# Patient Record
Sex: Female | Born: 1975
Health system: Midwestern US, Community
[De-identification: ages and names within clinical notes are randomized; demographics above are authoritative.]

## PROBLEM LIST (undated history)

## (undated) DIAGNOSIS — E559 Vitamin D deficiency, unspecified: Secondary | ICD-10-CM

## (undated) DIAGNOSIS — D5700 Hb-SS disease with crisis, unspecified: Principal | ICD-10-CM

## (undated) DIAGNOSIS — D571 Sickle-cell disease without crisis: Principal | ICD-10-CM

## (undated) DIAGNOSIS — T85818A Embolism due to other internal prosthetic devices, implants and grafts, initial encounter: Secondary | ICD-10-CM

## (undated) DIAGNOSIS — I639 Cerebral infarction, unspecified: Secondary | ICD-10-CM

## (undated) DIAGNOSIS — R569 Unspecified convulsions: Secondary | ICD-10-CM

## (undated) HISTORY — PX: TUBAL LIGATION: SHX77

## (undated) HISTORY — PX: CHOLECYSTECTOMY: SHX55

## (undated) MED FILL — VITAMIN D (ERGOCALCIFE 1.25 MG CAPS: 1.25 MG (50000 UT) | 14 days supply | Qty: 14 | Fill #0 | Status: AC

## (undated) MED FILL — LEVOFLOXACIN 500MG TABS: 500 MG | 10 days supply | Qty: 10 | Fill #0 | Status: AC

## (undated) MED FILL — HYDROcodone/APAP 5-325MG TAB: 5-325 MG | 6 days supply | Qty: 42 | Fill #0 | Status: AC

---

## 2013-12-04 DIAGNOSIS — K5909 Other constipation: Secondary | ICD-10-CM | POA: Diagnosis not present

## 2013-12-04 DIAGNOSIS — F341 Dysthymic disorder: Secondary | ICD-10-CM | POA: Diagnosis not present

## 2013-12-04 DIAGNOSIS — D57819 Other sickle-cell disorders with crisis, unspecified: Secondary | ICD-10-CM | POA: Diagnosis not present

## 2013-12-04 DIAGNOSIS — M542 Cervicalgia: Secondary | ICD-10-CM | POA: Diagnosis not present

## 2013-12-04 DIAGNOSIS — Z888 Allergy status to other drugs, medicaments and biological substances status: Secondary | ICD-10-CM | POA: Diagnosis not present

## 2013-12-04 DIAGNOSIS — G8929 Other chronic pain: Secondary | ICD-10-CM | POA: Diagnosis not present

## 2013-12-04 DIAGNOSIS — G43909 Migraine, unspecified, not intractable, without status migrainosus: Secondary | ICD-10-CM | POA: Diagnosis not present

## 2013-12-04 DIAGNOSIS — K219 Gastro-esophageal reflux disease without esophagitis: Secondary | ICD-10-CM | POA: Diagnosis not present

## 2013-12-04 DIAGNOSIS — D571 Sickle-cell disease without crisis: Secondary | ICD-10-CM | POA: Diagnosis not present

## 2013-12-04 DIAGNOSIS — R110 Nausea: Secondary | ICD-10-CM | POA: Diagnosis not present

## 2013-12-04 DIAGNOSIS — I69998 Other sequelae following unspecified cerebrovascular disease: Secondary | ICD-10-CM | POA: Diagnosis not present

## 2013-12-04 DIAGNOSIS — R29898 Other symptoms and signs involving the musculoskeletal system: Secondary | ICD-10-CM | POA: Diagnosis not present

## 2013-12-04 DIAGNOSIS — R569 Unspecified convulsions: Secondary | ICD-10-CM | POA: Diagnosis not present

## 2013-12-04 DIAGNOSIS — R079 Chest pain, unspecified: Secondary | ICD-10-CM | POA: Diagnosis not present

## 2013-12-04 DIAGNOSIS — R0789 Other chest pain: Secondary | ICD-10-CM | POA: Diagnosis not present

## 2013-12-04 DIAGNOSIS — T85898A Other specified complication of other internal prosthetic devices, implants and grafts, initial encounter: Secondary | ICD-10-CM | POA: Diagnosis not present

## 2013-12-04 DIAGNOSIS — Z79899 Other long term (current) drug therapy: Secondary | ICD-10-CM | POA: Diagnosis not present

## 2013-12-04 DIAGNOSIS — G4700 Insomnia, unspecified: Secondary | ICD-10-CM | POA: Diagnosis not present

## 2013-12-04 DIAGNOSIS — D473 Essential (hemorrhagic) thrombocythemia: Secondary | ICD-10-CM | POA: Diagnosis not present

## 2013-12-09 DIAGNOSIS — M542 Cervicalgia: Secondary | ICD-10-CM | POA: Diagnosis not present

## 2013-12-09 DIAGNOSIS — R569 Unspecified convulsions: Secondary | ICD-10-CM | POA: Diagnosis not present

## 2013-12-09 DIAGNOSIS — G8929 Other chronic pain: Secondary | ICD-10-CM | POA: Diagnosis not present

## 2013-12-09 DIAGNOSIS — R110 Nausea: Secondary | ICD-10-CM | POA: Diagnosis not present

## 2013-12-09 DIAGNOSIS — D571 Sickle-cell disease without crisis: Secondary | ICD-10-CM | POA: Diagnosis not present

## 2013-12-09 DIAGNOSIS — G4700 Insomnia, unspecified: Secondary | ICD-10-CM | POA: Diagnosis not present

## 2013-12-09 DIAGNOSIS — R079 Chest pain, unspecified: Secondary | ICD-10-CM | POA: Diagnosis not present

## 2013-12-09 DIAGNOSIS — K5909 Other constipation: Secondary | ICD-10-CM | POA: Diagnosis not present

## 2013-12-12 DIAGNOSIS — A4189 Other specified sepsis: Secondary | ICD-10-CM | POA: Diagnosis not present

## 2013-12-12 DIAGNOSIS — D571 Sickle-cell disease without crisis: Secondary | ICD-10-CM | POA: Diagnosis not present

## 2013-12-12 DIAGNOSIS — I749 Embolism and thrombosis of unspecified artery: Secondary | ICD-10-CM | POA: Diagnosis not present

## 2013-12-17 DIAGNOSIS — D571 Sickle-cell disease without crisis: Secondary | ICD-10-CM | POA: Diagnosis not present

## 2014-03-08 DIAGNOSIS — N390 Urinary tract infection, site not specified: Secondary | ICD-10-CM | POA: Diagnosis not present

## 2014-03-08 DIAGNOSIS — R059 Cough, unspecified: Secondary | ICD-10-CM | POA: Diagnosis not present

## 2014-03-08 DIAGNOSIS — M542 Cervicalgia: Secondary | ICD-10-CM | POA: Diagnosis not present

## 2014-03-08 DIAGNOSIS — R079 Chest pain, unspecified: Secondary | ICD-10-CM | POA: Diagnosis not present

## 2014-03-08 DIAGNOSIS — R05 Cough: Secondary | ICD-10-CM | POA: Diagnosis not present

## 2014-03-08 DIAGNOSIS — D649 Anemia, unspecified: Secondary | ICD-10-CM | POA: Diagnosis not present

## 2014-03-08 DIAGNOSIS — G40909 Epilepsy, unspecified, not intractable, without status epilepticus: Secondary | ICD-10-CM | POA: Diagnosis not present

## 2014-03-08 DIAGNOSIS — Z79899 Other long term (current) drug therapy: Secondary | ICD-10-CM | POA: Diagnosis not present

## 2014-03-08 DIAGNOSIS — R159 Full incontinence of feces: Secondary | ICD-10-CM | POA: Diagnosis not present

## 2014-03-29 DIAGNOSIS — Z886 Allergy status to analgesic agent status: Secondary | ICD-10-CM | POA: Diagnosis not present

## 2014-03-29 DIAGNOSIS — D5700 Hb-SS disease with crisis, unspecified: Secondary | ICD-10-CM | POA: Diagnosis not present

## 2014-03-29 DIAGNOSIS — Z888 Allergy status to other drugs, medicaments and biological substances status: Secondary | ICD-10-CM | POA: Diagnosis not present

## 2014-04-02 DIAGNOSIS — M549 Dorsalgia, unspecified: Secondary | ICD-10-CM | POA: Diagnosis not present

## 2014-04-02 DIAGNOSIS — D571 Sickle-cell disease without crisis: Secondary | ICD-10-CM | POA: Diagnosis not present

## 2014-05-10 DIAGNOSIS — D5700 Hb-SS disease with crisis, unspecified: Secondary | ICD-10-CM | POA: Diagnosis not present

## 2014-05-10 DIAGNOSIS — R072 Precordial pain: Secondary | ICD-10-CM | POA: Diagnosis not present

## 2014-05-10 DIAGNOSIS — R0789 Other chest pain: Secondary | ICD-10-CM | POA: Diagnosis not present

## 2014-05-10 DIAGNOSIS — Z885 Allergy status to narcotic agent status: Secondary | ICD-10-CM | POA: Diagnosis not present

## 2014-05-10 DIAGNOSIS — Z888 Allergy status to other drugs, medicaments and biological substances status: Secondary | ICD-10-CM | POA: Diagnosis not present

## 2014-05-11 DIAGNOSIS — D5700 Hb-SS disease with crisis, unspecified: Secondary | ICD-10-CM | POA: Diagnosis not present

## 2014-05-11 DIAGNOSIS — R072 Precordial pain: Secondary | ICD-10-CM | POA: Diagnosis not present

## 2014-05-11 DIAGNOSIS — Z885 Allergy status to narcotic agent status: Secondary | ICD-10-CM | POA: Diagnosis not present

## 2014-05-11 DIAGNOSIS — Z888 Allergy status to other drugs, medicaments and biological substances status: Secondary | ICD-10-CM | POA: Diagnosis not present

## 2014-08-02 DIAGNOSIS — Z885 Allergy status to narcotic agent status: Secondary | ICD-10-CM | POA: Diagnosis not present

## 2014-08-02 DIAGNOSIS — D5700 Hb-SS disease with crisis, unspecified: Secondary | ICD-10-CM | POA: Diagnosis not present

## 2014-08-02 DIAGNOSIS — Z888 Allergy status to other drugs, medicaments and biological substances status: Secondary | ICD-10-CM | POA: Diagnosis not present

## 2014-08-02 DIAGNOSIS — Z886 Allergy status to analgesic agent status: Secondary | ICD-10-CM | POA: Diagnosis not present

## 2014-08-02 DIAGNOSIS — Z91199 Patient's noncompliance with other medical treatment and regimen due to unspecified reason: Secondary | ICD-10-CM | POA: Diagnosis not present

## 2014-08-02 DIAGNOSIS — D571 Sickle-cell disease without crisis: Secondary | ICD-10-CM | POA: Diagnosis not present

## 2014-08-02 DIAGNOSIS — Z9119 Patient's noncompliance with other medical treatment and regimen: Secondary | ICD-10-CM | POA: Diagnosis not present

## 2014-08-09 DIAGNOSIS — Z7901 Long term (current) use of anticoagulants: Secondary | ICD-10-CM | POA: Diagnosis not present

## 2014-08-09 DIAGNOSIS — D571 Sickle-cell disease without crisis: Secondary | ICD-10-CM | POA: Diagnosis not present

## 2014-08-09 DIAGNOSIS — M79609 Pain in unspecified limb: Secondary | ICD-10-CM | POA: Diagnosis not present

## 2014-08-09 DIAGNOSIS — D5700 Hb-SS disease with crisis, unspecified: Secondary | ICD-10-CM | POA: Diagnosis not present

## 2014-08-09 DIAGNOSIS — Z86711 Personal history of pulmonary embolism: Secondary | ICD-10-CM | POA: Diagnosis not present

## 2014-08-09 DIAGNOSIS — Z79899 Other long term (current) drug therapy: Secondary | ICD-10-CM | POA: Diagnosis not present

## 2014-08-09 DIAGNOSIS — Z8673 Personal history of transient ischemic attack (TIA), and cerebral infarction without residual deficits: Secondary | ICD-10-CM | POA: Diagnosis not present

## 2014-08-14 DIAGNOSIS — D571 Sickle-cell disease without crisis: Secondary | ICD-10-CM | POA: Diagnosis not present

## 2014-08-14 DIAGNOSIS — A4189 Other specified sepsis: Secondary | ICD-10-CM | POA: Diagnosis not present

## 2014-08-14 DIAGNOSIS — I749 Embolism and thrombosis of unspecified artery: Secondary | ICD-10-CM | POA: Diagnosis not present

## 2014-08-16 DIAGNOSIS — E860 Dehydration: Secondary | ICD-10-CM | POA: Diagnosis not present

## 2014-08-16 DIAGNOSIS — D571 Sickle-cell disease without crisis: Secondary | ICD-10-CM | POA: Diagnosis not present

## 2014-09-11 DIAGNOSIS — A4189 Other specified sepsis: Secondary | ICD-10-CM | POA: Diagnosis not present

## 2014-09-11 DIAGNOSIS — I8291 Chronic embolism and thrombosis of unspecified vein: Secondary | ICD-10-CM | POA: Diagnosis not present

## 2014-09-11 DIAGNOSIS — D571 Sickle-cell disease without crisis: Secondary | ICD-10-CM | POA: Diagnosis not present

## 2014-11-10 DIAGNOSIS — R51 Headache: Secondary | ICD-10-CM | POA: Diagnosis not present

## 2014-11-10 DIAGNOSIS — M25569 Pain in unspecified knee: Secondary | ICD-10-CM | POA: Diagnosis not present

## 2014-11-10 DIAGNOSIS — M79609 Pain in unspecified limb: Secondary | ICD-10-CM | POA: Diagnosis not present

## 2014-11-10 DIAGNOSIS — M25561 Pain in right knee: Secondary | ICD-10-CM | POA: Diagnosis not present

## 2014-11-20 DIAGNOSIS — A4189 Other specified sepsis: Secondary | ICD-10-CM | POA: Diagnosis not present

## 2014-11-20 DIAGNOSIS — I8291 Chronic embolism and thrombosis of unspecified vein: Secondary | ICD-10-CM | POA: Diagnosis not present

## 2014-11-20 DIAGNOSIS — D571 Sickle-cell disease without crisis: Secondary | ICD-10-CM | POA: Diagnosis not present

## 2014-12-11 DIAGNOSIS — I8291 Chronic embolism and thrombosis of unspecified vein: Secondary | ICD-10-CM | POA: Diagnosis not present

## 2014-12-11 DIAGNOSIS — D571 Sickle-cell disease without crisis: Secondary | ICD-10-CM | POA: Diagnosis not present

## 2014-12-11 DIAGNOSIS — A4189 Other specified sepsis: Secondary | ICD-10-CM | POA: Diagnosis not present

## 2014-12-14 DIAGNOSIS — I8291 Chronic embolism and thrombosis of unspecified vein: Secondary | ICD-10-CM | POA: Diagnosis not present

## 2014-12-14 DIAGNOSIS — D571 Sickle-cell disease without crisis: Secondary | ICD-10-CM | POA: Diagnosis not present

## 2014-12-14 DIAGNOSIS — A4189 Other specified sepsis: Secondary | ICD-10-CM | POA: Diagnosis not present

## 2015-01-13 DIAGNOSIS — Z887 Allergy status to serum and vaccine status: Secondary | ICD-10-CM | POA: Diagnosis not present

## 2015-01-13 DIAGNOSIS — Z886 Allergy status to analgesic agent status: Secondary | ICD-10-CM | POA: Diagnosis not present

## 2015-01-13 DIAGNOSIS — D57819 Other sickle-cell disorders with crisis, unspecified: Secondary | ICD-10-CM | POA: Diagnosis not present

## 2015-01-13 DIAGNOSIS — Z885 Allergy status to narcotic agent status: Secondary | ICD-10-CM | POA: Diagnosis not present

## 2015-01-26 DIAGNOSIS — J309 Allergic rhinitis, unspecified: Secondary | ICD-10-CM | POA: Diagnosis not present

## 2015-01-26 DIAGNOSIS — R569 Unspecified convulsions: Secondary | ICD-10-CM | POA: Diagnosis not present

## 2015-01-26 DIAGNOSIS — Z7901 Long term (current) use of anticoagulants: Secondary | ICD-10-CM | POA: Diagnosis not present

## 2015-01-26 DIAGNOSIS — R51 Headache: Secondary | ICD-10-CM | POA: Diagnosis not present

## 2015-02-05 DIAGNOSIS — D5700 Hb-SS disease with crisis, unspecified: Secondary | ICD-10-CM | POA: Diagnosis not present

## 2015-02-05 DIAGNOSIS — D709 Neutropenia, unspecified: Secondary | ICD-10-CM | POA: Diagnosis not present

## 2015-03-19 DIAGNOSIS — R748 Abnormal levels of other serum enzymes: Secondary | ICD-10-CM | POA: Diagnosis not present

## 2015-03-19 DIAGNOSIS — Z888 Allergy status to other drugs, medicaments and biological substances status: Secondary | ICD-10-CM | POA: Diagnosis not present

## 2015-03-19 DIAGNOSIS — R7989 Other specified abnormal findings of blood chemistry: Secondary | ICD-10-CM | POA: Diagnosis not present

## 2015-03-19 DIAGNOSIS — Z885 Allergy status to narcotic agent status: Secondary | ICD-10-CM | POA: Diagnosis not present

## 2015-03-19 DIAGNOSIS — D57819 Other sickle-cell disorders with crisis, unspecified: Secondary | ICD-10-CM | POA: Diagnosis not present

## 2015-04-29 DIAGNOSIS — R0789 Other chest pain: Secondary | ICD-10-CM | POA: Diagnosis not present

## 2015-04-29 DIAGNOSIS — R7989 Other specified abnormal findings of blood chemistry: Secondary | ICD-10-CM | POA: Diagnosis not present

## 2015-04-29 DIAGNOSIS — D571 Sickle-cell disease without crisis: Secondary | ICD-10-CM | POA: Diagnosis not present

## 2015-05-08 DIAGNOSIS — D5700 Hb-SS disease with crisis, unspecified: Secondary | ICD-10-CM | POA: Diagnosis not present

## 2015-05-08 DIAGNOSIS — R079 Chest pain, unspecified: Secondary | ICD-10-CM | POA: Diagnosis not present

## 2015-05-09 DIAGNOSIS — D5700 Hb-SS disease with crisis, unspecified: Secondary | ICD-10-CM | POA: Diagnosis not present

## 2015-05-28 DIAGNOSIS — R7989 Other specified abnormal findings of blood chemistry: Secondary | ICD-10-CM | POA: Diagnosis not present

## 2015-05-28 DIAGNOSIS — Z885 Allergy status to narcotic agent status: Secondary | ICD-10-CM | POA: Diagnosis not present

## 2015-05-28 DIAGNOSIS — R0789 Other chest pain: Secondary | ICD-10-CM | POA: Diagnosis not present

## 2015-05-28 DIAGNOSIS — Z91040 Latex allergy status: Secondary | ICD-10-CM | POA: Diagnosis not present

## 2015-05-28 DIAGNOSIS — Z887 Allergy status to serum and vaccine status: Secondary | ICD-10-CM | POA: Diagnosis not present

## 2015-05-28 DIAGNOSIS — I8290 Acute embolism and thrombosis of unspecified vein: Secondary | ICD-10-CM | POA: Diagnosis not present

## 2015-05-28 DIAGNOSIS — Z886 Allergy status to analgesic agent status: Secondary | ICD-10-CM | POA: Diagnosis not present

## 2015-05-28 DIAGNOSIS — Z9114 Patient's other noncompliance with medication regimen: Secondary | ICD-10-CM | POA: Diagnosis not present

## 2015-05-28 DIAGNOSIS — M79609 Pain in unspecified limb: Secondary | ICD-10-CM | POA: Diagnosis not present

## 2015-05-28 DIAGNOSIS — Z888 Allergy status to other drugs, medicaments and biological substances status: Secondary | ICD-10-CM | POA: Diagnosis not present

## 2015-05-28 DIAGNOSIS — R109 Unspecified abdominal pain: Secondary | ICD-10-CM | POA: Diagnosis not present

## 2015-06-03 DIAGNOSIS — D5700 Hb-SS disease with crisis, unspecified: Secondary | ICD-10-CM | POA: Diagnosis not present

## 2015-06-03 DIAGNOSIS — D709 Neutropenia, unspecified: Secondary | ICD-10-CM | POA: Diagnosis not present

## 2015-07-26 DIAGNOSIS — Z885 Allergy status to narcotic agent status: Secondary | ICD-10-CM | POA: Diagnosis not present

## 2015-07-26 DIAGNOSIS — R748 Abnormal levels of other serum enzymes: Secondary | ICD-10-CM | POA: Diagnosis not present

## 2015-07-26 DIAGNOSIS — R7989 Other specified abnormal findings of blood chemistry: Secondary | ICD-10-CM | POA: Diagnosis not present

## 2015-07-26 DIAGNOSIS — Z886 Allergy status to analgesic agent status: Secondary | ICD-10-CM | POA: Diagnosis not present

## 2015-07-26 DIAGNOSIS — Z888 Allergy status to other drugs, medicaments and biological substances status: Secondary | ICD-10-CM | POA: Diagnosis not present

## 2015-07-26 DIAGNOSIS — I2699 Other pulmonary embolism without acute cor pulmonale: Secondary | ICD-10-CM | POA: Diagnosis not present

## 2015-07-26 DIAGNOSIS — I639 Cerebral infarction, unspecified: Secondary | ICD-10-CM | POA: Diagnosis not present

## 2015-07-26 DIAGNOSIS — R079 Chest pain, unspecified: Secondary | ICD-10-CM | POA: Diagnosis not present

## 2015-07-26 DIAGNOSIS — R109 Unspecified abdominal pain: Secondary | ICD-10-CM | POA: Diagnosis not present

## 2015-07-26 DIAGNOSIS — R569 Unspecified convulsions: Secondary | ICD-10-CM | POA: Diagnosis not present

## 2015-07-26 DIAGNOSIS — I82211 Chronic embolism and thrombosis of superior vena cava: Secondary | ICD-10-CM | POA: Diagnosis not present

## 2015-08-18 DIAGNOSIS — D5700 Hb-SS disease with crisis, unspecified: Secondary | ICD-10-CM | POA: Diagnosis not present

## 2015-08-18 DIAGNOSIS — S2341XA Sprain of ribs, initial encounter: Secondary | ICD-10-CM | POA: Diagnosis not present

## 2015-08-18 DIAGNOSIS — Z86718 Personal history of other venous thrombosis and embolism: Secondary | ICD-10-CM | POA: Diagnosis not present

## 2015-08-18 DIAGNOSIS — R209 Unspecified disturbances of skin sensation: Secondary | ICD-10-CM | POA: Diagnosis not present

## 2015-08-18 DIAGNOSIS — G40909 Epilepsy, unspecified, not intractable, without status epilepticus: Secondary | ICD-10-CM | POA: Diagnosis not present

## 2015-08-18 DIAGNOSIS — R0789 Other chest pain: Secondary | ICD-10-CM | POA: Diagnosis not present

## 2015-08-18 DIAGNOSIS — I871 Compression of vein: Secondary | ICD-10-CM | POA: Diagnosis not present

## 2015-08-18 DIAGNOSIS — K219 Gastro-esophageal reflux disease without esophagitis: Secondary | ICD-10-CM | POA: Diagnosis not present

## 2015-08-18 DIAGNOSIS — Z8673 Personal history of transient ischemic attack (TIA), and cerebral infarction without residual deficits: Secondary | ICD-10-CM | POA: Diagnosis not present

## 2015-08-18 DIAGNOSIS — G43909 Migraine, unspecified, not intractable, without status migrainosus: Secondary | ICD-10-CM | POA: Diagnosis not present

## 2015-08-18 DIAGNOSIS — I8291 Chronic embolism and thrombosis of unspecified vein: Secondary | ICD-10-CM | POA: Diagnosis not present

## 2015-08-19 DIAGNOSIS — G8194 Hemiplegia, unspecified affecting left nondominant side: Secondary | ICD-10-CM | POA: Diagnosis not present

## 2015-08-19 DIAGNOSIS — I8291 Chronic embolism and thrombosis of unspecified vein: Secondary | ICD-10-CM | POA: Diagnosis not present

## 2015-08-19 DIAGNOSIS — Z8673 Personal history of transient ischemic attack (TIA), and cerebral infarction without residual deficits: Secondary | ICD-10-CM | POA: Diagnosis not present

## 2015-08-19 DIAGNOSIS — G40909 Epilepsy, unspecified, not intractable, without status epilepticus: Secondary | ICD-10-CM | POA: Diagnosis not present

## 2015-08-19 DIAGNOSIS — K219 Gastro-esophageal reflux disease without esophagitis: Secondary | ICD-10-CM | POA: Diagnosis not present

## 2015-08-19 DIAGNOSIS — G43909 Migraine, unspecified, not intractable, without status migrainosus: Secondary | ICD-10-CM | POA: Diagnosis not present

## 2015-08-19 DIAGNOSIS — D5700 Hb-SS disease with crisis, unspecified: Secondary | ICD-10-CM | POA: Diagnosis not present

## 2015-08-19 DIAGNOSIS — Z86718 Personal history of other venous thrombosis and embolism: Secondary | ICD-10-CM | POA: Diagnosis not present

## 2015-08-19 DIAGNOSIS — I871 Compression of vein: Secondary | ICD-10-CM | POA: Diagnosis not present

## 2015-08-20 DIAGNOSIS — M25552 Pain in left hip: Secondary | ICD-10-CM | POA: Diagnosis not present

## 2015-08-20 DIAGNOSIS — I871 Compression of vein: Secondary | ICD-10-CM | POA: Diagnosis present

## 2015-08-20 DIAGNOSIS — G8194 Hemiplegia, unspecified affecting left nondominant side: Secondary | ICD-10-CM | POA: Diagnosis not present

## 2015-08-20 DIAGNOSIS — Z86718 Personal history of other venous thrombosis and embolism: Secondary | ICD-10-CM | POA: Diagnosis not present

## 2015-08-20 DIAGNOSIS — K219 Gastro-esophageal reflux disease without esophagitis: Secondary | ICD-10-CM | POA: Diagnosis present

## 2015-08-20 DIAGNOSIS — D5700 Hb-SS disease with crisis, unspecified: Secondary | ICD-10-CM | POA: Diagnosis not present

## 2015-08-20 DIAGNOSIS — G40909 Epilepsy, unspecified, not intractable, without status epilepticus: Secondary | ICD-10-CM | POA: Diagnosis present

## 2015-08-20 DIAGNOSIS — I8291 Chronic embolism and thrombosis of unspecified vein: Secondary | ICD-10-CM | POA: Diagnosis not present

## 2015-08-20 DIAGNOSIS — D559 Anemia due to enzyme disorder, unspecified: Secondary | ICD-10-CM | POA: Diagnosis not present

## 2015-08-20 DIAGNOSIS — G43909 Migraine, unspecified, not intractable, without status migrainosus: Secondary | ICD-10-CM | POA: Diagnosis present

## 2015-08-20 DIAGNOSIS — Z833 Family history of diabetes mellitus: Secondary | ICD-10-CM | POA: Diagnosis not present

## 2015-08-20 DIAGNOSIS — Z8249 Family history of ischemic heart disease and other diseases of the circulatory system: Secondary | ICD-10-CM | POA: Diagnosis not present

## 2015-08-20 DIAGNOSIS — Z8673 Personal history of transient ischemic attack (TIA), and cerebral infarction without residual deficits: Secondary | ICD-10-CM | POA: Diagnosis not present

## 2015-11-03 DIAGNOSIS — Z01810 Encounter for preprocedural cardiovascular examination: Secondary | ICD-10-CM | POA: Diagnosis not present

## 2015-11-03 DIAGNOSIS — R079 Chest pain, unspecified: Secondary | ICD-10-CM | POA: Diagnosis not present

## 2015-11-03 DIAGNOSIS — F329 Major depressive disorder, single episode, unspecified: Secondary | ICD-10-CM | POA: Diagnosis not present

## 2015-11-03 DIAGNOSIS — Z7901 Long term (current) use of anticoagulants: Secondary | ICD-10-CM | POA: Diagnosis not present

## 2015-11-03 DIAGNOSIS — Z515 Encounter for palliative care: Secondary | ICD-10-CM | POA: Diagnosis not present

## 2015-11-03 DIAGNOSIS — G894 Chronic pain syndrome: Secondary | ICD-10-CM | POA: Diagnosis present

## 2015-11-03 DIAGNOSIS — R071 Chest pain on breathing: Secondary | ICD-10-CM | POA: Diagnosis not present

## 2015-11-03 DIAGNOSIS — D57812 Other sickle-cell disorders with splenic sequestration: Secondary | ICD-10-CM | POA: Diagnosis not present

## 2015-11-03 DIAGNOSIS — D5700 Hb-SS disease with crisis, unspecified: Secondary | ICD-10-CM | POA: Diagnosis not present

## 2015-11-03 DIAGNOSIS — D5701 Hb-SS disease with acute chest syndrome: Secondary | ICD-10-CM | POA: Diagnosis not present

## 2015-11-03 DIAGNOSIS — I871 Compression of vein: Secondary | ICD-10-CM | POA: Diagnosis not present

## 2015-11-03 DIAGNOSIS — G43109 Migraine with aura, not intractable, without status migrainosus: Secondary | ICD-10-CM | POA: Diagnosis not present

## 2015-11-03 DIAGNOSIS — G8929 Other chronic pain: Secondary | ICD-10-CM | POA: Diagnosis not present

## 2015-11-03 DIAGNOSIS — G40909 Epilepsy, unspecified, not intractable, without status epilepticus: Secondary | ICD-10-CM | POA: Diagnosis not present

## 2015-11-03 DIAGNOSIS — R0789 Other chest pain: Secondary | ICD-10-CM | POA: Diagnosis not present

## 2015-11-03 DIAGNOSIS — Z8673 Personal history of transient ischemic attack (TIA), and cerebral infarction without residual deficits: Secondary | ICD-10-CM | POA: Diagnosis not present

## 2015-11-03 DIAGNOSIS — M549 Dorsalgia, unspecified: Secondary | ICD-10-CM | POA: Diagnosis not present

## 2015-11-03 DIAGNOSIS — D5720 Sickle-cell/Hb-C disease without crisis: Secondary | ICD-10-CM | POA: Diagnosis not present

## 2015-11-03 DIAGNOSIS — Z86718 Personal history of other venous thrombosis and embolism: Secondary | ICD-10-CM | POA: Diagnosis not present

## 2015-11-03 DIAGNOSIS — R0602 Shortness of breath: Secondary | ICD-10-CM | POA: Diagnosis not present

## 2015-11-03 DIAGNOSIS — Z5181 Encounter for therapeutic drug level monitoring: Secondary | ICD-10-CM | POA: Diagnosis not present

## 2015-11-03 DIAGNOSIS — I82211 Chronic embolism and thrombosis of superior vena cava: Secondary | ICD-10-CM | POA: Diagnosis not present

## 2015-11-03 DIAGNOSIS — D571 Sickle-cell disease without crisis: Secondary | ICD-10-CM | POA: Diagnosis not present

## 2015-11-03 DIAGNOSIS — G43909 Migraine, unspecified, not intractable, without status migrainosus: Secondary | ICD-10-CM | POA: Diagnosis present

## 2015-11-09 DIAGNOSIS — R0602 Shortness of breath: Secondary | ICD-10-CM | POA: Diagnosis not present

## 2015-11-09 DIAGNOSIS — R079 Chest pain, unspecified: Secondary | ICD-10-CM | POA: Diagnosis not present

## 2015-12-01 DIAGNOSIS — Z8673 Personal history of transient ischemic attack (TIA), and cerebral infarction without residual deficits: Secondary | ICD-10-CM | POA: Diagnosis not present

## 2015-12-01 DIAGNOSIS — Z833 Family history of diabetes mellitus: Secondary | ICD-10-CM | POA: Diagnosis not present

## 2015-12-01 DIAGNOSIS — D5700 Hb-SS disease with crisis, unspecified: Secondary | ICD-10-CM | POA: Diagnosis not present

## 2015-12-01 DIAGNOSIS — K219 Gastro-esophageal reflux disease without esophagitis: Secondary | ICD-10-CM | POA: Diagnosis not present

## 2015-12-01 DIAGNOSIS — Z86718 Personal history of other venous thrombosis and embolism: Secondary | ICD-10-CM | POA: Diagnosis not present

## 2015-12-01 DIAGNOSIS — I871 Compression of vein: Secondary | ICD-10-CM | POA: Diagnosis not present

## 2015-12-01 DIAGNOSIS — G40909 Epilepsy, unspecified, not intractable, without status epilepticus: Secondary | ICD-10-CM | POA: Diagnosis not present

## 2015-12-01 DIAGNOSIS — Z8249 Family history of ischemic heart disease and other diseases of the circulatory system: Secondary | ICD-10-CM | POA: Diagnosis not present

## 2015-12-03 DIAGNOSIS — K219 Gastro-esophageal reflux disease without esophagitis: Secondary | ICD-10-CM | POA: Diagnosis not present

## 2015-12-03 DIAGNOSIS — I871 Compression of vein: Secondary | ICD-10-CM | POA: Diagnosis not present

## 2015-12-03 DIAGNOSIS — D5700 Hb-SS disease with crisis, unspecified: Secondary | ICD-10-CM | POA: Diagnosis not present

## 2015-12-03 DIAGNOSIS — Z8673 Personal history of transient ischemic attack (TIA), and cerebral infarction without residual deficits: Secondary | ICD-10-CM | POA: Diagnosis not present

## 2015-12-03 DIAGNOSIS — G40909 Epilepsy, unspecified, not intractable, without status epilepticus: Secondary | ICD-10-CM | POA: Diagnosis not present

## 2015-12-03 DIAGNOSIS — Z833 Family history of diabetes mellitus: Secondary | ICD-10-CM | POA: Diagnosis not present

## 2015-12-03 DIAGNOSIS — Z8249 Family history of ischemic heart disease and other diseases of the circulatory system: Secondary | ICD-10-CM | POA: Diagnosis not present

## 2015-12-03 DIAGNOSIS — G894 Chronic pain syndrome: Secondary | ICD-10-CM | POA: Diagnosis not present

## 2015-12-03 DIAGNOSIS — Z86718 Personal history of other venous thrombosis and embolism: Secondary | ICD-10-CM | POA: Diagnosis not present

## 2015-12-03 DIAGNOSIS — R0789 Other chest pain: Secondary | ICD-10-CM | POA: Diagnosis not present

## 2015-12-04 DIAGNOSIS — D649 Anemia, unspecified: Secondary | ICD-10-CM | POA: Diagnosis not present

## 2015-12-04 DIAGNOSIS — G894 Chronic pain syndrome: Secondary | ICD-10-CM | POA: Diagnosis not present

## 2015-12-04 DIAGNOSIS — Z833 Family history of diabetes mellitus: Secondary | ICD-10-CM | POA: Diagnosis not present

## 2015-12-04 DIAGNOSIS — D5700 Hb-SS disease with crisis, unspecified: Secondary | ICD-10-CM | POA: Diagnosis not present

## 2015-12-04 DIAGNOSIS — Z8673 Personal history of transient ischemic attack (TIA), and cerebral infarction without residual deficits: Secondary | ICD-10-CM | POA: Diagnosis not present

## 2015-12-04 DIAGNOSIS — Z7901 Long term (current) use of anticoagulants: Secondary | ICD-10-CM | POA: Diagnosis not present

## 2015-12-04 DIAGNOSIS — D571 Sickle-cell disease without crisis: Secondary | ICD-10-CM | POA: Diagnosis not present

## 2015-12-04 DIAGNOSIS — Z8249 Family history of ischemic heart disease and other diseases of the circulatory system: Secondary | ICD-10-CM | POA: Diagnosis not present

## 2015-12-04 DIAGNOSIS — K219 Gastro-esophageal reflux disease without esophagitis: Secondary | ICD-10-CM | POA: Diagnosis not present

## 2015-12-04 DIAGNOSIS — I771 Stricture of artery: Secondary | ICD-10-CM | POA: Diagnosis present

## 2015-12-04 DIAGNOSIS — I871 Compression of vein: Secondary | ICD-10-CM | POA: Diagnosis not present

## 2015-12-04 DIAGNOSIS — Z86718 Personal history of other venous thrombosis and embolism: Secondary | ICD-10-CM | POA: Diagnosis not present

## 2015-12-04 DIAGNOSIS — G40909 Epilepsy, unspecified, not intractable, without status epilepticus: Secondary | ICD-10-CM | POA: Diagnosis not present

## 2016-01-02 DIAGNOSIS — Y939 Activity, unspecified: Secondary | ICD-10-CM | POA: Diagnosis not present

## 2016-01-02 DIAGNOSIS — S20212A Contusion of left front wall of thorax, initial encounter: Secondary | ICD-10-CM | POA: Diagnosis not present

## 2016-01-02 DIAGNOSIS — D571 Sickle-cell disease without crisis: Secondary | ICD-10-CM | POA: Diagnosis not present

## 2016-01-02 DIAGNOSIS — R079 Chest pain, unspecified: Secondary | ICD-10-CM | POA: Diagnosis not present

## 2016-01-02 DIAGNOSIS — Z8673 Personal history of transient ischemic attack (TIA), and cerebral infarction without residual deficits: Secondary | ICD-10-CM | POA: Diagnosis not present

## 2016-01-02 DIAGNOSIS — Y92003 Bedroom of unspecified non-institutional (private) residence as the place of occurrence of the external cause: Secondary | ICD-10-CM | POA: Diagnosis not present

## 2016-01-31 ENCOUNTER — Encounter (HOSPITAL_COMMUNITY): Payer: Self-pay | Admitting: *Deleted

## 2016-01-31 ENCOUNTER — Emergency Department (HOSPITAL_COMMUNITY)
Admission: EM | Admit: 2016-01-31 | Discharge: 2016-01-31 | Disposition: A | Discharge status: Home / Self Care | Payer: Self-pay | Attending: Emergency Medicine | Admitting: Emergency Medicine

## 2016-01-31 DIAGNOSIS — R079 Chest pain, unspecified: Secondary | ICD-10-CM | POA: Insufficient documentation

## 2016-01-31 DIAGNOSIS — D5700 Hb-SS disease with crisis, unspecified: Secondary | ICD-10-CM | POA: Insufficient documentation

## 2016-01-31 HISTORY — DX: Embolism due to other internal prosthetic devices, implants and grafts, initial encounter: T85.818A

## 2016-01-31 HISTORY — DX: Sickle-cell disease without crisis: D57.1

## 2016-01-31 HISTORY — DX: Unspecified convulsions: R56.9

## 2016-01-31 HISTORY — DX: Cerebral infarction, unspecified: I63.9

## 2016-01-31 LAB — COMPREHENSIVE METABOLIC PANEL
ALBUMIN: 4.2 g/dL (ref 3.5–5.0)
ALK PHOS: 112 U/L (ref 38–126)
ALT: 19 U/L (ref 14–54)
AST: 27 U/L (ref 15–41)
Anion gap: 11 (ref 5–15)
BILIRUBIN TOTAL: 0.9 mg/dL (ref 0.3–1.2)
CALCIUM: 9.1 mg/dL (ref 8.9–10.3)
CO2: 20 mmol/L — ABNORMAL LOW (ref 22–32)
Chloride: 109 mmol/L (ref 101–111)
Creatinine, Ser: 0.81 mg/dL (ref 0.44–1.00)
GFR calc Af Amer: >60 mL/min (ref >60)
GLUCOSE: 94 mg/dL (ref 65–99)
POTASSIUM: 4.3 mmol/L (ref 3.5–5.1)
Sodium: 140 mmol/L (ref 135–145)
TOTAL PROTEIN: 8.2 g/dL — AB (ref 6.5–8.1)

## 2016-01-31 LAB — CBC WITH DIFFERENTIAL/PLATELET
BASOS ABS: 0 10*3/uL (ref 0.0–0.1)
BASOS PCT: 0 %
EOS ABS: 0 10*3/uL (ref 0.0–0.7)
EOS PCT: 1 %
HCT: 31.7 % — ABNORMAL LOW (ref 36.0–46.0)
Hemoglobin: 11.3 g/dL — ABNORMAL LOW (ref 12.0–15.0)
Lymphocytes Relative: 40 %
Lymphs Abs: 1.9 10*3/uL (ref 0.7–4.0)
MCH: 38.7 pg — ABNORMAL HIGH (ref 26.0–34.0)
MCHC: 35.6 g/dL (ref 30.0–36.0)
MCV: 108.6 fL — ABNORMAL HIGH (ref 78.0–100.0)
MONO ABS: 0.7 10*3/uL (ref 0.1–1.0)
Monocytes Relative: 14 %
Neutro Abs: 2.2 10*3/uL (ref 1.7–7.7)
Neutrophils Relative %: 45 %
PLATELETS: 360 10*3/uL (ref 150–400)
RBC: 2.92 MIL/uL — AB (ref 3.87–5.11)
RDW: 18.7 % — AB (ref 11.5–15.5)
WBC: 4.9 10*3/uL (ref 4.0–10.5)

## 2016-01-31 NOTE — ED Notes (Signed)
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 Reports onset to left side chest pain last night. Reports swelling to chest and now having swelling and pain to her left arm.

## 2016-02-02 ENCOUNTER — Emergency Department (HOSPITAL_COMMUNITY): Payer: Medicare Other

## 2016-02-02 ENCOUNTER — Emergency Department (HOSPITAL_COMMUNITY)
Admission: EM | Admit: 2016-02-02 | Discharge: 2016-02-02 | Disposition: A | Discharge status: Home / Self Care | Payer: Medicare Other | Attending: Emergency Medicine | Admitting: Emergency Medicine

## 2016-02-02 ENCOUNTER — Emergency Department (EMERGENCY_DEPARTMENT_HOSPITAL): Payer: Medicare Other

## 2016-02-02 ENCOUNTER — Encounter (HOSPITAL_COMMUNITY): Payer: Self-pay | Admitting: Emergency Medicine

## 2016-02-02 DIAGNOSIS — Z7901 Long term (current) use of anticoagulants: Secondary | ICD-10-CM | POA: Diagnosis not present

## 2016-02-02 DIAGNOSIS — M7989 Other specified soft tissue disorders: Secondary | ICD-10-CM

## 2016-02-02 DIAGNOSIS — M25422 Effusion, left elbow: Secondary | ICD-10-CM | POA: Insufficient documentation

## 2016-02-02 DIAGNOSIS — Z3202 Encounter for pregnancy test, result negative: Secondary | ICD-10-CM | POA: Insufficient documentation

## 2016-02-02 DIAGNOSIS — R05 Cough: Secondary | ICD-10-CM | POA: Diagnosis not present

## 2016-02-02 DIAGNOSIS — D5700 Hb-SS disease with crisis, unspecified: Secondary | ICD-10-CM

## 2016-02-02 DIAGNOSIS — Z79899 Other long term (current) drug therapy: Secondary | ICD-10-CM | POA: Insufficient documentation

## 2016-02-02 DIAGNOSIS — M79609 Pain in unspecified limb: Secondary | ICD-10-CM

## 2016-02-02 DIAGNOSIS — R0981 Nasal congestion: Secondary | ICD-10-CM | POA: Diagnosis not present

## 2016-02-02 DIAGNOSIS — G8929 Other chronic pain: Secondary | ICD-10-CM | POA: Insufficient documentation

## 2016-02-02 DIAGNOSIS — Z8673 Personal history of transient ischemic attack (TIA), and cerebral infarction without residual deficits: Secondary | ICD-10-CM | POA: Diagnosis not present

## 2016-02-02 DIAGNOSIS — Z91040 Latex allergy status: Secondary | ICD-10-CM | POA: Insufficient documentation

## 2016-02-02 LAB — CBC WITH DIFFERENTIAL/PLATELET
BASOS PCT: 1 %
Basophils Absolute: 0.1 10*3/uL (ref 0.0–0.1)
EOS ABS: 0 10*3/uL (ref 0.0–0.7)
EOS PCT: 1 %
HCT: 29.6 % — ABNORMAL LOW (ref 36.0–46.0)
Hemoglobin: 10.3 g/dL — ABNORMAL LOW (ref 12.0–15.0)
LYMPHS ABS: 2.5 10*3/uL (ref 0.7–4.0)
Lymphocytes Relative: 51 %
MCH: 38 pg — AB (ref 26.0–34.0)
MCHC: 34.8 g/dL (ref 30.0–36.0)
MCV: 109.2 fL — ABNORMAL HIGH (ref 78.0–100.0)
Monocytes Absolute: 0.7 10*3/uL (ref 0.1–1.0)
Monocytes Relative: 14 %
Neutro Abs: 1.6 10*3/uL — ABNORMAL LOW (ref 1.7–7.7)
Neutrophils Relative %: 33 %
PLATELETS: 376 10*3/uL (ref 150–400)
RBC: 2.71 MIL/uL — AB (ref 3.87–5.11)
RDW: 18.4 % — ABNORMAL HIGH (ref 11.5–15.5)
WBC: 4.9 10*3/uL (ref 4.0–10.5)

## 2016-02-02 LAB — RETICULOCYTES
RBC.: 2.71 MIL/uL — AB (ref 3.87–5.11)
Retic Count, Absolute: 75.9 10*3/uL (ref 19.0–186.0)
Retic Ct Pct: 2.8 % (ref 0.4–3.1)

## 2016-02-02 LAB — I-STAT TROPONIN, ED: Troponin i, poc: 0 ng/mL (ref 0.00–0.08)

## 2016-02-02 LAB — BASIC METABOLIC PANEL WITH GFR
Anion gap: 8 (ref 5–15)
BUN: 8 mg/dL (ref 6–20)
CO2: 23 mmol/L (ref 22–32)
Calcium: 8.8 mg/dL — ABNORMAL LOW (ref 8.9–10.3)
Chloride: 108 mmol/L (ref 101–111)
Creatinine, Ser: 0.78 mg/dL (ref 0.44–1.00)
GFR calc Af Amer: >60 mL/min
GFR calc non Af Amer: >60 mL/min
Glucose, Bld: 93 mg/dL (ref 65–99)
Potassium: 3.7 mmol/L (ref 3.5–5.1)
Sodium: 139 mmol/L (ref 135–145)

## 2016-02-02 LAB — APTT: aPTT: 31 s (ref 24–37)

## 2016-02-02 LAB — PROTIME-INR
INR: 1.07 (ref 0.00–1.49)
PROTHROMBIN TIME: 14.1 s (ref 11.6–15.2)

## 2016-02-02 LAB — I-STAT BETA HCG BLOOD, ED (MC, WL, AP ONLY): I-stat hCG, quantitative: <5 m[IU]/mL (ref <5)

## 2016-02-02 MED ORDER — DIPHENHYDRAMINE HCL 50 MG/ML IJ SOLN
25.0000 mg | Freq: Once | INTRAMUSCULAR | Status: AC
  Administered 2016-02-02: 25 mg via INTRAVENOUS
  Filled 2016-02-02: qty 1

## 2016-02-02 MED ORDER — IOHEXOL 350 MG/ML SOLN
100.0000 mL | Freq: Once | INTRAVENOUS | Status: AC | PRN
  Administered 2016-02-02: 100 mL via INTRAVENOUS

## 2016-02-02 MED ORDER — HYDROMORPHONE HCL 2 MG/ML IJ SOLN
2.0000 mg | Freq: Once | INTRAMUSCULAR | Status: AC
  Administered 2016-02-02: 2 mg via INTRAVENOUS
  Filled 2016-02-02: qty 1

## 2016-02-02 MED ORDER — SODIUM CHLORIDE 0.45 % IV SOLN
INTRAVENOUS | Status: DC
  Administered 2016-02-02: 17:00:00 via INTRAVENOUS

## 2016-02-02 MED ORDER — ONDANSETRON HCL 4 MG/2ML IJ SOLN
4.0000 mg | Freq: Once | INTRAMUSCULAR | Status: AC
  Administered 2016-02-02: 4 mg via INTRAVENOUS
  Filled 2016-02-02: qty 2

## 2016-02-02 MED ORDER — HYDROMORPHONE HCL 1 MG/ML IJ SOLN
1.0000 mg | Freq: Once | INTRAMUSCULAR | Status: AC
  Administered 2016-02-02: 1 mg via INTRAVENOUS
  Filled 2016-02-02: qty 1

## 2016-02-02 NOTE — ED Notes (Signed)
Patient transported to CT 

## 2016-02-02 NOTE — ED Provider Notes (Signed)
CSN: [id number]     Arrival date & time 02/02/16  1347 History   First MD Initiated Contact with Patient 02/02/16 1610     Chief Complaint  Patient presents with  . Sickle Cell Pain Crisis     (Consider location/radiation/quality/duration/timing/severity/associated sxs/prior Treatment) HPI   Pt is a 40 yo female with PMH of sickle cell, seizures, stroke and blood clots who presents to the ED with complaint of left chest and arm pain and swelling, onset 2 weeks. Pt reports she has had worsening pain and swelling to her left chest and left upper arm for the past few weeks. Denies any recent fall, trauma or injury. Denies any aggravating or alleviating factors. She notes she has had similar pain in the past when she was dx with a blood clot in her right breast. 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 She also reports having nasal congestion, cough and sore throat. Denies fever, chills, HA, visual changes, lightheadedness, dizziness, SOB, wheezing, palpitations, abdominal pain, N/V/D, urinary sxs, weakness. Pt reports having constant numbness to her left arm which is a deficit from her stroke in 2002 and reports it as unchanged. She notes she currently does not have any more pain meds left and she has only been taking her Xarelto, keppra, hydroxyurea and folic acid. She notes her PCP is in [place] but notes she recently moved to [place].   Past Medical History  Diagnosis Date  . Sickle cell disease ([place])   . Seizures ([place])   . Stroke ([place])   . Blood clot due to device, implant, or graft    History reviewed. No pertinent past surgical history. No family history on file. Social History  Substance Use Topics  . Smoking status: Never Smoker   . Smokeless tobacco: None  . Alcohol Use: No   OB History    No data available     Review of Systems  HENT:  Positive for congestion.   Respiratory: Positive for cough.   Cardiovascular: Positive for chest pain.  Musculoskeletal: Positive for back pain, joint swelling (left arm) and arthralgias (left arm, bilateral legs).  All other systems reviewed and are negative.     Allergies  Demerol; Latex; Other; and Oxycontin  Home Medications   Prior to Admission medications   Medication Sig Start Date End Date Taking? Authorizing Provider  cetirizine (ZYRTEC) 10 MG tablet Take 10 mg by mouth daily.   Yes Historical Provider, MD  folic acid (FOLVITE) 1 MG tablet Take 1 mg by mouth daily.   Yes Historical Provider, MD  hydroxyurea (HYDREA) 500 MG capsule Take 1,000 mg by mouth daily. May take with food to minimize GI side effects.   Yes Historical Provider, MD  levETIRAcetam (KEPPRA) 500 MG tablet Take 1,000 mg by mouth every 12 (twelve) hours.   Yes Historical Provider, MD  methadone (DOLOPHINE) 10 MG tablet Take 10 mg by mouth every 12 (twelve) hours.   Yes Historical Provider, MD  rivaroxaban (XARELTO) 20 MG TABS tablet Take 20 mg by mouth daily.   Yes Historical Provider, MD   BP 121/67 mmHg  Pulse 85  Temp(Src) 98.5 F (36.9 C) (Oral)  Resp 14  Ht 5\' 8"  (1.727 m)  Wt 66.225 kg  BMI 22.20 kg/m2  SpO2 96%  LMP 01/06/2016 Physical Exam  Constitutional: She is oriented to person, place, and time.  She appears well-developed and well-nourished.  HENT:  Head: Normocephalic and atraumatic.  Mouth/Throat: Oropharynx is clear and moist. No oropharyngeal exudate.  Eyes: Conjunctivae and EOM are normal. Right eye exhibits no discharge. Left eye exhibits no discharge. No scleral icterus.  Neck: Normal range of motion. Neck supple.  Cardiovascular: Normal rate, regular rhythm, normal heart sounds and intact distal pulses.   Pulmonary/Chest: Effort normal and breath sounds normal. No respiratory distress. She has no wheezes. She has no rales. She exhibits tenderness (left anterior and lateral chest  wall mildly TTP).  Abdominal: Soft. Bowel sounds are normal. She exhibits no distension and no mass. There is no tenderness. There is no rebound and no guarding.  Musculoskeletal: Normal range of motion. She exhibits no edema.  TTP over bilateral cervical, thoracic, and lumbar paraspinal muscles. No C/T/L midline tenderness. FROM of neck and back with reported pain. Mild swelling noted to left proximal arm near axillary region. FROM of BUE and BLE with 5/5 strength. Sensation grossly intact but pt reports numbness to left arm which she notes is unchanged fromm her CVA in 2002. 2+ radial and PT pulses. Cap refill <2.   Lymphadenopathy:    She has no cervical adenopathy.  Neurological: She is alert and oriented to person, place, and time.  Skin: Skin is warm and dry.  Nursing note and vitals reviewed.   ED Course  Procedures (including critical care time) Labs Review Labs Reviewed  CBC WITH DIFFERENTIAL/PLATELET - Abnormal; Notable for the following:    RBC 2.71 (*)    Hemoglobin 10.3 (*)    HCT 29.6 (*)    MCV 109.2 (*)    MCH 38.0 (*)    RDW 18.4 (*)    Neutro Abs 1.6 (*)    All other components within normal limits  RETICULOCYTES - Abnormal; Notable for the following:    RBC. 2.71 (*)    All other components within normal limits  BASIC METABOLIC PANEL - Abnormal; Notable for the following:    Calcium 8.8 (*)    All other components within normal limits  PROTIME-INR  APTT  I-STAT TROPOININ, ED  I-STAT BETA HCG BLOOD, ED (MC, WL, AP ONLY)    Imaging Review Ct Angio Chest Pe W/cm &/or Wo Cm  02/02/2016  CLINICAL DATA:  Sickle cell crisis. Arm pain and back pain. Bilateral leg pain. EXAM: CT ANGIOGRAPHY CHEST WITH CONTRAST TECHNIQUE: Multidetector CT imaging of the chest was performed using the standard protocol during bolus administration of intravenous contrast. Multiplanar CT image reconstructions and MIPs were obtained to evaluate the vascular anatomy. CONTRAST:  12mL OMNIPAQUE  IOHEXOL 350 MG/ML SOLN COMPARISON:  None. FINDINGS: Mediastinum/Nodes: No filling defects within the pulmonary arteries to clearly localize pulmonary emboli. There is a focus of low density within the LEFT proximal lower lobe pulmonary artery (image 101, series 7). This is not in the typical shape of pulmonary embolism and felt to represent attenuation artifact. The more distal vessel of fills normally. No additional evidence of pulmonary emboli. 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 RIGHT antecubital fossa injection. No acute findings aorta great vessels.  No pericardial fluid. No mediastinal hilar lymphadenopathy.  Esophagus is normal. Lungs/Pleura: There is mild linear reticular pattern in the LEFT and RIGHT lung base. There is atelectasis in the inferior lingula. There is no airspace disease. No ground-glass opacities. No  infarction. Upper abdomen: Limited view of the liver, kidneys, pancreas are unremarkable. Normal adrenal glands. The spleen is small and calcified Musculoskeletal: No diffuse sclerosis of the bones per Review of the MIP images confirms the above findings. IMPRESSION: 1. No evidence acute pulmonary embolism. 2. Low-attenuation focus within proximal LEFT main pulmonary artery is felt to represent an attenuation artifact. 3. 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 4. No evidence of pulmonary infection or infarction. 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 5. Autoinfarction the spleen. 6. diffuse skeletal sclerosis consistent sickle cell. Electronically Signed   By: Suzy Bouchard M.D.   On: 02/02/2016 18:34   I have personally reviewed and evaluated these images and lab results as part of my medical decision-making.   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 She notes she recently moved to [place] over the past 2 weeks and has been out of her home Dilaudid. Hx of CVA (on xarelto), seizures and multiple blood clots. VSS. Exam revealed mild tenderness and swelling over left lateral chest wall and left proximal arm near axillary region, bilateral C/T/L paraspinal muscles TTP. No back pain red flags. Bilateral upper and lower extremities otherwise neurovascularly intact. Patient given IV fluids and IV pain meds, benadryl and zofran in the ED.   EKG showed sinus rhythm. Troponin negative. Pregnancy negative. Labs appear to be at patient's baseline values. CT NGO chest revealed no acute PE, occlusion of right subclavian vein/superior vena cava with multiple venous collaterals, no evidence of pulmonary infection or infarction, chronic reticular scarring of the lung bases noted, diffuse skeletal sclerosis consistent with sickle cell. 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 Patient given a total of 3 doses of IV Dilaudid. On reevaluation after her third dose, patient reports her pain has improved and she is requesting to go home at that time. Discussed with patient her results and plan for discharge. Patient given information regarding establishing care and further management at the sickle cell clinic. Patient endorses understanding and reports she will call the clinic in the morning to schedule an appointment. Patient able to tolerate by mouth in the ED.  Evaluation does not show pathology requring ongoing emergent intervention or admission. Pt is hemodynamically stable and mentating appropriately. Discussed findings/results and plan with  patient/guardian, who agrees with plan. All questions answered. Return precautions discussed and outpatient follow up given.      Chesley Noon [place], [place] 02/02/16 2011  Tanna Furry, MD 02/14/16 [phone]

## 2016-02-02 NOTE — Discharge Instructions (Signed)
Take your home medications at prescribed. Call the Sickle Cell clinic listed above tomorrow morning to schedule a follow up appointment to establish care. Please return to the Emergency Department if symptoms worsen or new onset of fever, shortness of breath, chest pain, coughing up blood, abdominal pain, swelling, vomiting, numbness, tingling, weakness, lightheadedness, dizziness.   Emergency Department Resource Guide 1) Find a Doctor and Pay Out of Pocket Although you won't have to find out who is covered by your insurance plan, it is a good idea to ask around and get recommendations. You will then need to call the office and see if the doctor you have chosen will accept you as a new patient and what types of options they offer for patients who are self-pay. Some doctors offer discounts or will set up payment plans for their patients who do not have insurance, but you will need to ask so you aren't surprised when you get to your appointment.  2) Contact Your Local Health Department Not all health departments have doctors that can see patients for sick visits, but many do, so it is worth a call to see if yours does. If you don't know where your local health department is, you can check in your phone book. The CDC also has a tool to help you locate your state's health department, and many state websites also have listings of all of their local health departments.  3) Find a [facility] If your illness is not likely to be very severe or complicated, you may want to try a walk in clinic. These are popping up all over the country in pharmacies, drugstores, and shopping centers. They're usually staffed by nurse practitioners or physician assistants that have been trained to treat common illnesses and complaints. They're usually fairly quick and inexpensive. However, if you have serious medical issues or chronic medical problems, these are probably not your best option.  No Primary Care Doctor: - Call  Health Connect at  [phone] - they can help you locate a primary care doctor that  accepts your insurance, provides certain services, etc. - Physician Referral Service- [phone]  Chronic Pain Problems: Organization         Address  Phone   Notes  [facility]  [phone] Patients need to be referred by their primary care doctor.   Medication Assistance: Organization         Address  Phone   Notes  [facility] Medication [facility] [address] [place], [place] [address] [phone] --Must be a resident of [facility] -- Must have NO insurance coverage whatsoever (no Medicaid/ Medicare, etc.) -- The pt. MUST have a primary care doctor that directs their care regularly and follows them in the community   MedAssist  [phone]   [employer]  [phone]    Agencies that provide inexpensive medical care: Organization         Address  Phone   Notes  [place]  [phone]   [facility]    [phone]   [facility] [place], [place] [address] [phone]   [place] [address] [place] [phone]   Planned Parenthood    [phone]   [facility]    [phone]   [place] and [place], [place] Phone:  [phone], Fax:  [phone]  Hours of Operation:  9 am - 6 pm, M-F.  Also accepts Medicaid/Medicare and self-pay.  [facility] for [place], [address], [place] Phone: [phone], Fax: [phone]. Hours of Operation:  8:30 am - 5:30 pm, M-F.  Also accepts Medicaid and self-pay.  [facility] [place] [address] [place] Phone: [phone]   [place], [place], [place] [phone], Ext. [phone] Mondays & Thursdays: 7-9 AM.  First 15 patients are seen on a first come, first serve  basis.    [place] Providers:  Organization         Address  Phone   Notes  [facility] [address], [place] [phone] Also accepts self-pay patients.  [facility] [id number] [place], [place]  [phone]   [place], [address], [place] [phone]   [facility] [address] [place] [phone]   Lucianne Lei [address], [place]   [phone] Only accepts [place] Access [place] patients after they have their name applied to their card.   Self-Pay (no insurance) in [facility]:  Organization         Address  Phone   Notes  Sickle Cell Patients, [facility] [place] [phone]   [facility] [place] [phone]   [facility] [place]  [place], [place], [place] [phone]   [facility]/Dr. Osei-Bonsu  [address], [place] or [address], [place] [phone] Phone number for both [place] and [place] locations is the same.  [facility] [address], [place] [phone]   [facility] [address], [place] or [address] [phone]   [facility] [address] [place] [phone], phone; [phone], fax Sees patients 1st and 3rd Saturday of every month.  Must not qualify for public or private insurance (i.e. Medicaid, Medicare, [place] Health Choice, Veterans' Benefits)  Household income should be no more than 200% of the poverty level The clinic cannot treat you if you are pregnant or think you are pregnant  Sexually transmitted diseases are not treated at the clinic.    Dental Care: Organization         Address  Phone  Notes  [facility] Department of [facility] [place] [phone] Accepts children up to age 26 who are enrolled in [place] or [place]; pregnant women with a Medicaid card; and children who have applied for Medicaid or [place] Health Choice, but were declined, whose parents can pay a reduced fee at time of service.  [facility] Department of [facility]  [address], [place] [phone] Accepts children up to age 39 who are enrolled in [place] or [place]; pregnant women with a Medicaid card; and children who have applied for Medicaid or [place] Health Choice, but were declined, whose parents can pay a reduced fee at time of service.  [place] Adult Dental Access PROGRAM  [place] [phone] Patients are seen by appointment only. Walk-ins are not accepted. [place] will  see patients 85 years of age and older. Monday - Tuesday (8am-5pm) Most Wednesdays (8:30-5pm) $30 per visit, cash only  [facility] Adult Dental Access PROGRAM  [address], [facility] [phone] Patients are seen by appointment only. Walk-ins are not accepted. [place] will see patients 66 years of age and older. One Wednesday Evening (Monthly: Volunteer Based).  $30 per visit, cash only  [place]  [phone] for adults; Children under age 39, call Graduate Pediatric Dentistry at [phone]. Children aged 45-14, please call [phone] to request a pediatric application.  Dental services are provided in all areas of dental care including fillings, crowns and bridges, complete and partial dentures, implants, gum treatment, root canals, and extractions. Preventive care is also provided. Treatment is provided to both adults and children. Patients are selected via a lottery and there is often a waiting list.   [facility] [address] [place]  [phone]  [email or web address]   [facility] [address] [place], [place] [phone], Ext. [phone] Second and Fourth Thursday of each month, opens at 6:30 AM; Clinic ends at 9 AM.  Patients are seen on a first-come first-served basis, and a limited number are seen during each clinic.   [facility]  [address] Hillard Danker [place], [place] [phone]   Eligibility Requirements You must have lived in [place], [place], or [place] counties for at least the last three months.   You cannot be eligible for state or federal sponsored [employer], including [employer], [place], or [employer].   You generally cannot be eligible for healthcare insurance through your employer.    How to apply: Eligibility screenings are held every Tuesday and Wednesday afternoon from 1:00 pm until 4:00 pm. You do not need an appointment for the interview!  [facility] [address], [place], [place]   [place]  [place] Department  [place]  [phone]    Behavioral Health Resources in the Community: Intensive Outpatient Programs Organization         Address  Phone  Notes  [place]. [address] [place], [place] [phone]   [facility] [address] [place], [place]   ADS: Alcohol & Drug Svcs [address], [place], [place]   [place] [address],  [place], [place] or [phone]   Substance Abuse Resources Organization         Address  Phone  Notes  Alcohol and Drug Services  [phone]   [place]  [phone]   [facility] [place]   Chinita Pester  [phone]   Residential & Outpatient Substance Abuse Program  [phone]   Psychological Services Organization          Address  Phone  Notes  [facility] [place]  [place]  [phone]   [place] [address], [place] or [phone]    Mobile Crisis Teams Organization         Address  Phone  Notes  [facility], Mobile Crisis Care Unit  [phone]   Assertive Psychotherapeutic Services  [address] [place], [place]   [facility] [address] [place] [phone]    Self-Help/Support Groups Organization  Address  Phone             Notes  [place]. of [place] - variety of support groups  [place] Call for more information  Narcotics Anonymous (NA), [facility]aring Services [address], [employer] [place]  2 meetings at this location   Special educational needs teacher         Address  Phone  Notes  ASAP Residential Treatment [place],    [place]  [phone]   [facility]  [address] [place] [phone], [place], [place]   [place], [place] [phone] Admissions: 8am-3pm M-F  Incentives Substance [place] [address]    [place], [place] [phone]   [facility] [address] [place], [place], [place]   [facility] [address]  [place], [place]   [facility] - Intensive Outpatient [address]een [address], [place], [place]   [facility] ([place].,  [place], [place] [phone] or [phone]   [facility] Services (RTS) [address] [place], [place] Accepts Medicaid  [facility] [place] [address],  [place] [phone] Substance Abuse/Addiction Treatment   [facility] Organization         Address  Phone  Notes  [facility]  [phone]   Domenic Schwab, PhD [address] [place] [address] Arlis [place], [place]   [phone] or [phone]   [place], [place] [phone]   [facility] [address] [place], [place] [phone] Insurance/Medicaid/sponsorship through [facility] and Families [address], [address] [place]                                    [place], [place] [phone] [place] [address], [place] [phone]    Dr. Adele Schilder  [phone]   [facility] Dept. 1) [address], [place] 2) [place] 3)  [place] [address], [place] [phone]  [phone]   [place] [phone] or [phone] (After Hours)

## 2016-02-02 NOTE — Progress Notes (Signed)
VASCULAR LAB PRELIMINARY  PRELIMINARY  PRELIMINARY  PRELIMINARY  Left upper extremity venous duplex completed.    Preliminary report:  No obvious evidence of left upper extremity DVT or superficial thrombosis. 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  Heather Oconnell, RVS 02/02/2016, 7:05 PM

## 2016-02-02 NOTE — ED Notes (Signed)
Pt reports back, left armand bilateral leg pain, sickle cell crisis per pt. sts out of pain meds an dis not from area. denies chest pain nor shortness of breath. Alert and oriented x 4.

## 2016-02-10 ENCOUNTER — Encounter (HOSPITAL_BASED_OUTPATIENT_CLINIC_OR_DEPARTMENT_OTHER): Payer: Self-pay | Admitting: *Deleted

## 2016-02-10 ENCOUNTER — Inpatient Hospital Stay (HOSPITAL_BASED_OUTPATIENT_CLINIC_OR_DEPARTMENT_OTHER)
Admission: EM | Admit: 2016-02-10 | Discharge: 2016-02-15 | DRG: 812 | Disposition: A | Discharge status: Home / Self Care | Payer: Medicare Other | Attending: Internal Medicine | Admitting: Internal Medicine

## 2016-02-10 ENCOUNTER — Emergency Department (HOSPITAL_BASED_OUTPATIENT_CLINIC_OR_DEPARTMENT_OTHER): Payer: Medicare Other

## 2016-02-10 DIAGNOSIS — Z7901 Long term (current) use of anticoagulants: Secondary | ICD-10-CM | POA: Diagnosis not present

## 2016-02-10 DIAGNOSIS — R252 Cramp and spasm: Secondary | ICD-10-CM | POA: Diagnosis not present

## 2016-02-10 DIAGNOSIS — Z23 Encounter for immunization: Secondary | ICD-10-CM

## 2016-02-10 DIAGNOSIS — K5900 Constipation, unspecified: Secondary | ICD-10-CM | POA: Diagnosis not present

## 2016-02-10 DIAGNOSIS — I82211 Chronic embolism and thrombosis of superior vena cava: Secondary | ICD-10-CM | POA: Diagnosis present

## 2016-02-10 DIAGNOSIS — Z8349 Family history of other endocrine, nutritional and metabolic diseases: Secondary | ICD-10-CM

## 2016-02-10 DIAGNOSIS — R05 Cough: Secondary | ICD-10-CM | POA: Diagnosis not present

## 2016-02-10 DIAGNOSIS — Z8673 Personal history of transient ischemic attack (TIA), and cerebral infarction without residual deficits: Secondary | ICD-10-CM

## 2016-02-10 DIAGNOSIS — Z590 Homelessness: Secondary | ICD-10-CM

## 2016-02-10 DIAGNOSIS — M7989 Other specified soft tissue disorders: Secondary | ICD-10-CM | POA: Diagnosis not present

## 2016-02-10 DIAGNOSIS — D5700 Hb-SS disease with crisis, unspecified: Principal | ICD-10-CM | POA: Diagnosis present

## 2016-02-10 DIAGNOSIS — R509 Fever, unspecified: Secondary | ICD-10-CM | POA: Diagnosis not present

## 2016-02-10 LAB — RETICULOCYTES
RBC.: 2.58 MIL/uL — ABNORMAL LOW (ref 3.87–5.11)
RETIC CT PCT: 3.8 % — AB (ref 0.4–3.1)
Retic Count, Absolute: 98 10*3/uL (ref 19.0–186.0)

## 2016-02-10 LAB — CBC WITH DIFFERENTIAL/PLATELET
Basophils Absolute: 0 10*3/uL (ref 0.0–0.1)
Basophils Relative: 0 %
EOS PCT: 0 %
Eosinophils Absolute: 0 10*3/uL (ref 0.0–0.7)
HEMATOCRIT: 27 % — AB (ref 36.0–46.0)
Hemoglobin: 9.9 g/dL — ABNORMAL LOW (ref 12.0–15.0)
Lymphocytes Relative: 47 %
Lymphs Abs: 2.1 10*3/uL (ref 0.7–4.0)
MCH: 39.3 pg — ABNORMAL HIGH (ref 26.0–34.0)
MCHC: 36.7 g/dL — ABNORMAL HIGH (ref 30.0–36.0)
MCV: 107.1 fL — AB (ref 78.0–100.0)
MONOS PCT: 14 %
Monocytes Absolute: 0.6 10*3/uL (ref 0.1–1.0)
NEUTROS ABS: 1.8 10*3/uL (ref 1.7–7.7)
NRBC: 3 /100{WBCs} — AB
Neutrophils Relative %: 39 %
Platelets: 364 10*3/uL (ref 150–400)
RBC: 2.52 MIL/uL — AB (ref 3.87–5.11)
RDW: 17.1 % — AB (ref 11.5–15.5)
WBC: 4.5 10*3/uL (ref 4.0–10.5)

## 2016-02-10 LAB — COMPREHENSIVE METABOLIC PANEL
ALK PHOS: 110 U/L (ref 38–126)
ALT: 22 U/L (ref 14–54)
AST: 23 U/L (ref 15–41)
Albumin: 4.1 g/dL (ref 3.5–5.0)
Anion gap: 9 (ref 5–15)
BILIRUBIN TOTAL: 1.1 mg/dL (ref 0.3–1.2)
BUN: 7 mg/dL (ref 6–20)
CALCIUM: 8.5 mg/dL — AB (ref 8.9–10.3)
CO2: 22 mmol/L (ref 22–32)
CREATININE: 0.7 mg/dL (ref 0.44–1.00)
Chloride: 106 mmol/L (ref 101–111)
GFR calc Af Amer: >60 mL/min (ref >60)
Glucose, Bld: 95 mg/dL (ref 65–99)
POTASSIUM: 3.9 mmol/L (ref 3.5–5.1)
Sodium: 137 mmol/L (ref 135–145)
TOTAL PROTEIN: 8.2 g/dL — AB (ref 6.5–8.1)

## 2016-02-10 LAB — TROPONIN I

## 2016-02-10 MED ORDER — HYDROMORPHONE 1 MG/ML IV SOLN
INTRAVENOUS | Status: DC
  Administered 2016-02-11: 01:00:00 via INTRAVENOUS
  Filled 2016-02-10: qty 25

## 2016-02-10 MED ORDER — SODIUM CHLORIDE 0.9 % IV BOLUS (SEPSIS)
1000.0000 mL | Freq: Once | INTRAVENOUS | Status: AC
  Administered 2016-02-10: 1000 mL via INTRAVENOUS

## 2016-02-10 MED ORDER — PROMETHAZINE HCL 25 MG/ML IJ SOLN
25.0000 mg | Freq: Once | INTRAMUSCULAR | Status: AC
  Administered 2016-02-10: 25 mg via INTRAVENOUS
  Filled 2016-02-10: qty 1

## 2016-02-10 MED ORDER — FOLIC ACID 1 MG PO TABS
1.0000 mg | ORAL_TABLET | Freq: Every day | ORAL | Status: DC
  Administered 2016-02-11 – 2016-02-15 (×5): 1 mg via ORAL
  Filled 2016-02-10 (×5): qty 1

## 2016-02-10 MED ORDER — ONDANSETRON HCL 4 MG/2ML IJ SOLN
4.0000 mg | Freq: Four times a day (QID) | INTRAMUSCULAR | Status: DC | PRN
  Administered 2016-02-11 – 2016-02-14 (×4): 4 mg via INTRAVENOUS
  Filled 2016-02-10 (×4): qty 2

## 2016-02-10 MED ORDER — POLYETHYLENE GLYCOL 3350 17 G PO PACK
17.0000 g | PACK | Freq: Every day | ORAL | Status: DC | PRN
  Administered 2016-02-13 – 2016-02-14 (×2): 17 g via ORAL
  Filled 2016-02-10 (×2): qty 1

## 2016-02-10 MED ORDER — SENNOSIDES-DOCUSATE SODIUM 8.6-50 MG PO TABS
1.0000 | ORAL_TABLET | Freq: Two times a day (BID) | ORAL | Status: DC
  Administered 2016-02-11 – 2016-02-15 (×9): 1 via ORAL
  Filled 2016-02-10 (×9): qty 1

## 2016-02-10 MED ORDER — DIPHENHYDRAMINE HCL 50 MG/ML IJ SOLN
25.0000 mg | Freq: Once | INTRAMUSCULAR | Status: AC
  Administered 2016-02-10: 25 mg via INTRAVENOUS
  Filled 2016-02-10: qty 1

## 2016-02-10 MED ORDER — LORATADINE 10 MG PO TABS
10.0000 mg | ORAL_TABLET | Freq: Every day | ORAL | Status: DC
  Administered 2016-02-11 – 2016-02-15 (×5): 10 mg via ORAL
  Filled 2016-02-10 (×5): qty 1

## 2016-02-10 MED ORDER — METHADONE HCL 10 MG PO TABS
10.0000 mg | ORAL_TABLET | Freq: Two times a day (BID) | ORAL | Status: DC
  Administered 2016-02-11 (×2): 10 mg via ORAL
  Filled 2016-02-10 (×2): qty 1

## 2016-02-10 MED ORDER — HYDROXYUREA 500 MG PO CAPS: 1000.0000 mg | ORAL_CAPSULE | Freq: Every day | ORAL | Status: DC

## 2016-02-10 MED ORDER — LEVETIRACETAM 500 MG PO TABS
1000.0000 mg | ORAL_TABLET | Freq: Two times a day (BID) | ORAL | Status: DC
  Administered 2016-02-11 – 2016-02-15 (×10): 1000 mg via ORAL
  Filled 2016-02-10 (×10): qty 2

## 2016-02-10 MED ORDER — RIVAROXABAN 20 MG PO TABS
20.0000 mg | ORAL_TABLET | Freq: Every day | ORAL | Status: DC
  Administered 2016-02-11 – 2016-02-15 (×5): 20 mg via ORAL
  Filled 2016-02-10 (×5): qty 1

## 2016-02-10 MED ORDER — SODIUM CHLORIDE 0.9 % IV BOLUS (SEPSIS)
500.0000 mL | Freq: Once | INTRAVENOUS | Status: AC
Start: 2016-02-10 — End: 2016-02-10
  Administered 2016-02-10: 500 mL via INTRAVENOUS

## 2016-02-10 MED ORDER — DIPHENHYDRAMINE HCL 12.5 MG/5ML PO ELIX
12.5000 mg | ORAL_SOLUTION | Freq: Four times a day (QID) | ORAL | Status: DC | PRN
  Filled 2016-02-10 (×2): qty 5

## 2016-02-10 MED ORDER — HYDROMORPHONE HCL 1 MG/ML IJ SOLN
1.0000 mg | Freq: Once | INTRAMUSCULAR | Status: AC
  Administered 2016-02-10: 1 mg via INTRAVENOUS
  Filled 2016-02-10: qty 1

## 2016-02-10 MED ORDER — HYDROMORPHONE HCL 1 MG/ML IJ SOLN: 1.0000 mg | Freq: Once | INTRAMUSCULAR | Status: DC

## 2016-02-10 MED ORDER — HYDROMORPHONE HCL 1 MG/ML IJ SOLN
2.0000 mg | Freq: Once | INTRAMUSCULAR | Status: AC
  Administered 2016-02-10: 2 mg via INTRAVENOUS
  Filled 2016-02-10: qty 2

## 2016-02-10 MED ORDER — HYDROMORPHONE 1 MG/ML IV SOLN: INTRAVENOUS | Status: DC

## 2016-02-10 MED ORDER — PNEUMOCOCCAL VAC POLYVALENT 25 MCG/0.5ML IJ INJ
0.5000 mL | INJECTION | INTRAMUSCULAR | Status: AC
  Administered 2016-02-11: 0.5 mL via INTRAMUSCULAR
  Filled 2016-02-10 (×2): qty 0.5

## 2016-02-10 MED ORDER — NALOXONE HCL 0.4 MG/ML IJ SOLN: 0.4000 mg | INTRAMUSCULAR | Status: DC | PRN

## 2016-02-10 MED ORDER — SODIUM CHLORIDE 0.9% FLUSH
9.0000 mL | INTRAVENOUS | Status: DC | PRN
  Administered 2016-02-11: 9 mL via INTRAVENOUS
  Filled 2016-02-10: qty 9

## 2016-02-10 MED ORDER — SODIUM CHLORIDE 0.9 % IV SOLN
12.5000 mg | Freq: Four times a day (QID) | INTRAVENOUS | Status: DC | PRN
  Administered 2016-02-11: 12.5 mg via INTRAVENOUS
  Filled 2016-02-10 (×6): qty 0.25

## 2016-02-10 NOTE — Plan of Care (Signed)
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  Going to med-surg bed.

## 2016-02-10 NOTE — H&P (Signed)
[facility] History and Physical  Heather Oconnell [id number] DOB: 21-May-1976 DOA: 02/10/2016  Referring physician: EDP PCP: No PCP Per Patient   Chief Complaint: Sickle cell pain crisis   HPI: Heather Oconnell is a 40 y.o. 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  Patient presents to the ED at [facility] with c/o cough, chest pain, back pain, bilateral leg pain.  Symptoms are typical of her sickle cell crisis.  Present for past 3 weeks with worsening 2 days ago.  No fever, chills, cough, nausea, vomiting, SOB.  Does have appointment with sickle cell clinic but not until end of month.  Review of Systems: Systems reviewed.  As above, otherwise negative  Past Medical History  Diagnosis Date  . Sickle cell disease ([place])   . Seizures ([place])   . Stroke ([place])   . Blood clot due to device, implant, or graft    History reviewed. No pertinent past surgical history. Social History:  reports that she has never smoked. She does not have any smokeless tobacco history on file. She reports that she does not drink alcohol or use illicit drugs.  Allergies  Allergen Reactions  . Demerol [Meperidine] Other (See Comments)    Causes seizures  . Other     Flu shot and duragesic  . Oxycontin [Oxycodone Hcl]   . Latex Rash    No family history on file.   Prior to Admission medications   Medication Sig Start Date End Date Taking? Authorizing Provider  cetirizine (ZYRTEC) 10 MG tablet Take 10 mg by mouth daily.   Yes Historical Provider, MD  folic acid (FOLVITE) 1 MG tablet Take 1 mg by mouth daily.   Yes Historical Provider, MD  hydroxyurea (HYDREA) 500 MG capsule Take 1,000 mg by mouth daily. May take with food to minimize GI side effects.   Yes Historical Provider, MD  levETIRAcetam (KEPPRA) 500 MG tablet Take 1,000 mg by mouth every 12 (twelve) hours.   Yes Historical Provider, MD  methadone (DOLOPHINE) 10 MG tablet Take 10 mg by mouth every 12  (twelve) hours.   Yes Historical Provider, MD  rivaroxaban (XARELTO) 20 MG TABS tablet Take 20 mg by mouth daily.   Yes Historical Provider, MD   Physical Exam: Filed Vitals:   02/10/16 2200 02/10/16 2241  BP: 106/75 128/58  Pulse: 96 75  Temp:  98.4 F (36.9 C)  Resp: 21 14    BP 128/58 mmHg  Pulse 75  Temp(Src) 98.4 F (36.9 C) (Oral)  Resp 14  Ht 5\' 8"  (1.727 m)  Wt 70.171 kg (154 lb 11.2 oz)  BMI 23.53 kg/m2  SpO2 96%  LMP 02/10/2016  General Appearance:    Alert, oriented, no distress, appears stated age  Head:    Normocephalic, atraumatic  Eyes:    PERRL, EOMI, sclera non-icteric        Nose:   Nares without drainage or epistaxis. Mucosa, turbinates normal  Throat:   Moist mucous membranes. Oropharynx without erythema or exudate.  Neck:   Supple. No carotid bruits.  No thyromegaly.  No lymphadenopathy.   Back:     No CVA tenderness, no spinal tenderness  Lungs:     Clear to auscultation bilaterally, without wheezes, rhonchi or rales  Chest wall:    No tenderness to palpitation  Heart:    Regular rate and rhythm without murmurs, gallops, rubs  Abdomen:     Soft, non-tender, nondistended, normal bowel sounds, no organomegaly  Genitalia:    deferred  Rectal:    deferred  Extremities:   No clubbing, cyanosis or edema.  Pulses:   2+ and symmetric all extremities  Skin:   Skin color, texture, turgor normal, no rashes or lesions  Lymph nodes:   Cervical, supraclavicular, and axillary nodes normal  Neurologic:   CNII-XII intact. Normal strength, sensation and reflexes      throughout    Labs on Admission:  Basic Metabolic Panel:  Recent Labs Lab 02/10/16 1600  NA 137  K 3.9  CL 106  CO2 22  GLUCOSE 95  BUN 7  CREATININE 0.70  CALCIUM 8.5*   Liver Function Tests:  Recent Labs Lab 02/10/16 1600  AST 23  ALT 22  ALKPHOS 110  BILITOT 1.1  PROT 8.2*  ALBUMIN 4.1   No results for input(s): LIPASE, AMYLASE in the last 168 hours. No results for  input(s): AMMONIA in the last 168 hours. CBC:  Recent Labs Lab 02/10/16 1600  WBC 4.5  NEUTROABS 1.8  HGB 9.9*  HCT 27.0*  MCV 107.1*  PLT 364   Cardiac Enzymes:  Recent Labs Lab 02/10/16 1600  TROPONINI <0.03    BNP (last 3 results) No results for input(s): PROBNP in the last 8760 hours. CBG: No results for input(s): GLUCAP in the last 168 hours.  Radiological Exams on Admission: Dg Chest 2 View  02/10/2016  CLINICAL DATA:  Cough fever and body aches for 3 weeks EXAM: CHEST  2 VIEW COMPARISON:  02/02/2016 CT scan FINDINGS: The heart size and vascular pattern are normal. No infiltrate or consolidation. No pleural effusion. IMPRESSION: No active cardiopulmonary disease. Electronically Signed   By: Skipper Cliche M.D.   On: 02/10/2016 15:17    EKG: Independently reviewed.  Assessment/Plan Active Problems:   Sickle cell pain crisis ([place])   1. Sickle cell pain crisis - 1. Review of care everywhere confirms patient has HGB SS disease by hemoglobin electrophoresis 2. HGB today is 9.0 3. Putting patient on dilaudid PCA, will do slightly less than weight based dosing to start with due to patient being new to the sickle service here.  Titrate up as needed. 4. Sickle cell pathway 5. Continue hydrea 6. Folic acid 2. Chronic SVC thrombus occlusion  1.  continue Xarelto    Code Status: Full  Family Communication: No family in room Disposition Plan: Admit to obs   Time spent: 50 min  GARDNER, JARED M. [facility] Pager [phone]  If 7AM-7PM, please contact the day team taking care of the patient [email or web address] Password [email or web address] 02/10/2016, 11:59 PM

## 2016-02-10 NOTE — ED Provider Notes (Signed)
CSN: [id number]     Arrival date & time 02/10/16  1418 History   First MD Initiated Contact with Patient 02/10/16 1522     Chief Complaint  Patient presents with  . Cough     (Consider location/radiation/quality/duration/timing/severity/associated sxs/prior Treatment) HPI Comments: Patient with a history of Sickle Cell Disease, Stroke, Seizures, and blood clots currently on Xarelto presents today with complaints of cough, chest pain, back pain, bilateral leg pain, and bilateral arm pain.  She reports that symptoms have been present for the past 3 weeks, but pain worsened 2 days ago.  She reports that the pain is similar to pain that she has had with a Sickle Cell pain crisis in the past.  She states that she has been taking OTC pain medication without relief.  She reports that she is out of her pain medication.  She states that she recently moved here from [place] and does not have an appointment with the [facility] until the end of the month.  She denies fever, chills, hemoptysis, SOB, nausea, vomiting, diarrhea, abdominal pain, headache, focal weakness, vision changes.  She reports that she has the chest pain primarily with coughing, but has slight pain even when she is not coughing.  She was seen in the ED on 02/02/16 for similar symptoms.  At that time, she had a  CT angio chest, which was negative for PE.  Remainder of work up was negative at that time and she was discharged home after three rounds of IV Dilaudid.  Patient is a 40 y.o. female presenting with cough. The history is provided by the EMS personnel.  Cough   Past Medical History  Diagnosis Date  . Sickle cell disease ([place])   . Seizures ([place])   . Stroke ([place])   . Blood clot due to device, implant, or graft    History reviewed. No pertinent past surgical history. No family history on file. Social History  Substance Use Topics  . Smoking status: Never Smoker   . Smokeless tobacco: None  . Alcohol Use: No   OB  History    No data available     Review of Systems  Respiratory: Positive for cough.   All other systems reviewed and are negative.     Allergies  Demerol; Latex; Other; and Oxycontin  Home Medications   Prior to Admission medications   Medication Sig Start Date End Date Taking? Authorizing Provider  cetirizine (ZYRTEC) 10 MG tablet Take 10 mg by mouth daily.    Historical Provider, MD  folic acid (FOLVITE) 1 MG tablet Take 1 mg by mouth daily.    Historical Provider, MD  hydroxyurea (HYDREA) 500 MG capsule Take 1,000 mg by mouth daily. May take with food to minimize GI side effects.    Historical Provider, MD  levETIRAcetam (KEPPRA) 500 MG tablet Take 1,000 mg by mouth every 12 (twelve) hours.    Historical Provider, MD  methadone (DOLOPHINE) 10 MG tablet Take 10 mg by mouth every 12 (twelve) hours.    Historical Provider, MD  rivaroxaban (XARELTO) 20 MG TABS tablet Take 20 mg by mouth daily.    Historical Provider, MD   BP 123/89 mmHg  Pulse 92  Temp(Src) 99.1 F (37.3 C) (Oral)  Resp 18  Ht 5\' 8"  (1.727 m)  Wt 66.225 kg  BMI 22.20 kg/m2  SpO2 100%  LMP 02/10/2016 Physical Exam  Constitutional: She appears well-developed and well-nourished.  HENT:  Head: Normocephalic and atraumatic.  Mouth/Throat: Oropharynx  is clear and moist.  Eyes: EOM are normal. Pupils are equal, round, and reactive to light.  Neck: Normal range of motion. Neck supple.  Cardiovascular: Normal rate, regular rhythm, normal heart sounds and intact distal pulses.   Pulmonary/Chest: Effort normal and breath sounds normal. No respiratory distress. She has no wheezes. She has no rales. She exhibits tenderness.  Abdominal: Soft. Bowel sounds are normal. She exhibits no distension and no mass. There is no tenderness. There is no rebound and no guarding.  Musculoskeletal: Normal range of motion.  Full ROM of all extremities  Neurological: She is alert.  Skin: Skin is warm and dry.  Psychiatric: She has  a normal mood and affect.  Nursing note and vitals reviewed.   ED Course  Procedures (including critical care time) Labs Review Labs Reviewed  CBC WITH DIFFERENTIAL/PLATELET  COMPREHENSIVE METABOLIC PANEL  RETICULOCYTES  TROPONIN I    Imaging Review Dg Chest 2 View  02/10/2016  CLINICAL DATA:  Cough fever and body aches for 3 weeks EXAM: CHEST  2 VIEW COMPARISON:  02/02/2016 CT scan FINDINGS: The heart size and vascular pattern are normal. No infiltrate or consolidation. No pleural effusion. IMPRESSION: No active cardiopulmonary disease. Electronically Signed   By: Skipper Cliche M.D.   On: 02/10/2016 15:17   I have personally reviewed and evaluated these images and lab results as part of my medical decision-making.   EKG Interpretation   Date/Time:  Thursday February 10 2016 15:48:59 EST Ventricular Rate:  85 PR Interval:  152 QRS Duration: 80 QT Interval:  362 QTC Calculation: 430 R Axis:   63 Text Interpretation:  Sinus rhythm Abnormal R-wave progression, early  transition Minimal ST elevation, inferior leads Baseline wander in lead(s)  V1 V3 No significant change since last tracing Confirmed by KNAPP  MD-J,  JON [id number]) on 02/10/2016 4:57:02 PM     5:00 PM Patient signed out to Etta Quill, NP at shift change.  Second dose of pain medication ordered.  He will follow up on pain. MDM   Final diagnoses:  None   Patient with a history of Sickle Cell disease presents today with cough, chest pain, back pain, and pain in her arms and legs.  She states that the pain is consistent with Sickle Cell pain crisis.  VSS.  No hypoxia.  No ischemic changes on EKG.  Troponin is negative.  Patient had a recent CT angio chest on 02/02/16, which was negative for PE.  CXR today is negative.  Patient reports that pain did not improve after one dose of pain medication.  Second dose of Dilaudid ordered.  Patient signed out to Etta Quill, NP at shift change.  Dispo pending based on pain management.   Etta Quill will follow up to assess the patient's pain and dispo accordingly.    Hyman Bible, PA-C 02/10/16 [place], MD 02/11/16 [phone]

## 2016-02-10 NOTE — ED Notes (Signed)
Cough, fever and body aches x 2 days.

## 2016-02-10 NOTE — ED Provider Notes (Signed)
Patient continues to have significant pain despite multiple rounds of dilaudid.  Patient does not have a local PCP, has recently relocated to this area from [facility].  Will contact unassigned medicine for admission.  Patient admitted to hospitalist service at [facility].  Etta Quill, NP 02/11/16 [id number]  Dorie Rank, MD 02/11/16 1130

## 2016-02-11 DIAGNOSIS — K5900 Constipation, unspecified: Secondary | ICD-10-CM | POA: Diagnosis present

## 2016-02-11 DIAGNOSIS — Z8673 Personal history of transient ischemic attack (TIA), and cerebral infarction without residual deficits: Secondary | ICD-10-CM | POA: Diagnosis not present

## 2016-02-11 DIAGNOSIS — D5700 Hb-SS disease with crisis, unspecified: Secondary | ICD-10-CM | POA: Diagnosis not present

## 2016-02-11 DIAGNOSIS — K5909 Other constipation: Secondary | ICD-10-CM | POA: Diagnosis not present

## 2016-02-11 DIAGNOSIS — M6248 Contracture of muscle, other site: Secondary | ICD-10-CM | POA: Diagnosis not present

## 2016-02-11 DIAGNOSIS — M7989 Other specified soft tissue disorders: Secondary | ICD-10-CM | POA: Diagnosis not present

## 2016-02-11 DIAGNOSIS — Z23 Encounter for immunization: Secondary | ICD-10-CM | POA: Diagnosis not present

## 2016-02-11 DIAGNOSIS — Z590 Homelessness: Secondary | ICD-10-CM | POA: Diagnosis not present

## 2016-02-11 DIAGNOSIS — Z7901 Long term (current) use of anticoagulants: Secondary | ICD-10-CM | POA: Diagnosis not present

## 2016-02-11 DIAGNOSIS — I82211 Chronic embolism and thrombosis of superior vena cava: Secondary | ICD-10-CM | POA: Diagnosis present

## 2016-02-11 DIAGNOSIS — Z8349 Family history of other endocrine, nutritional and metabolic diseases: Secondary | ICD-10-CM | POA: Diagnosis not present

## 2016-02-11 DIAGNOSIS — D638 Anemia in other chronic diseases classified elsewhere: Secondary | ICD-10-CM | POA: Diagnosis not present

## 2016-02-11 DIAGNOSIS — R252 Cramp and spasm: Secondary | ICD-10-CM | POA: Diagnosis present

## 2016-02-11 DIAGNOSIS — H0520 Unspecified exophthalmos: Secondary | ICD-10-CM | POA: Diagnosis not present

## 2016-02-11 MED ORDER — HYDROMORPHONE 1 MG/ML IV SOLN
INTRAVENOUS | Status: DC
  Administered 2016-02-11: 1.5 mg via INTRAVENOUS
  Administered 2016-02-11: 1 mg via INTRAVENOUS
  Administered 2016-02-11: 3 mg via INTRAVENOUS
  Administered 2016-02-11: 1 mg via INTRAVENOUS
  Administered 2016-02-11: 3 mg via INTRAVENOUS
  Administered 2016-02-11: 1.8 mg via INTRAVENOUS
  Administered 2016-02-12 (×5): 1 mg via INTRAVENOUS
  Administered 2016-02-12: 1.5 mg via INTRAVENOUS
  Administered 2016-02-13: 1.46 mg via INTRAVENOUS
  Administered 2016-02-13: 0.5 mg via INTRAVENOUS
  Administered 2016-02-13: 1 mg via INTRAVENOUS
  Administered 2016-02-13: 2.5 mg via INTRAVENOUS
  Administered 2016-02-13: 2 mg via INTRAVENOUS
  Administered 2016-02-13: 0.5 mg via INTRAVENOUS
  Administered 2016-02-14: 1 mg via INTRAVENOUS
  Administered 2016-02-14: 0.5 mg via INTRAVENOUS
  Filled 2016-02-11: qty 25

## 2016-02-11 MED ORDER — METHADONE HCL 10 MG PO TABS
10.0000 mg | ORAL_TABLET | Freq: Every day | ORAL | Status: DC
  Administered 2016-02-11 – 2016-02-14 (×4): 10 mg via ORAL
  Filled 2016-02-11 (×4): qty 1

## 2016-02-11 MED ORDER — DIPHENHYDRAMINE HCL 25 MG PO CAPS
25.0000 mg | ORAL_CAPSULE | ORAL | Status: DC | PRN
  Administered 2016-02-11 – 2016-02-13 (×4): 25 mg via ORAL
  Administered 2016-02-14: 50 mg via ORAL
  Filled 2016-02-11: qty 1
  Filled 2016-02-11: qty 2
  Filled 2016-02-11 (×3): qty 1

## 2016-02-11 MED ORDER — KETOROLAC TROMETHAMINE 30 MG/ML IJ SOLN
30.0000 mg | Freq: Four times a day (QID) | INTRAMUSCULAR | Status: DC
  Administered 2016-02-11 – 2016-02-15 (×16): 30 mg via INTRAVENOUS
  Filled 2016-02-11 (×16): qty 1

## 2016-02-11 MED FILL — Hydromorphone HCl Inj 2 MG/ML: INTRAMUSCULAR | Qty: 1 | Status: AC

## 2016-02-11 NOTE — Progress Notes (Signed)
Rx Brief note:  Hydroxyurea By [facility] policy, hydroxyurea is automatically held when any of the following laboratory values occur:  Hydroxyurea (Hydrea) hold criteria:  ANC < 2  Pltc < 80K in sickle-cell patients; < 100K in other patients  Hgb <= 6 in sickle-cell patients; < 8 in other patients  Reticulocytes < 80K when Hgb < 9  3/9:  ANC=1.8 Hydroxyurea has been held (discontinued from the profile) per policy.  Thanks Dorrene German 02/11/2016 2:37 AM

## 2016-02-11 NOTE — Progress Notes (Signed)
[facility] PROGRESS NOTE  Yoshika Pesantes [id number] DOB: 1976/09/25 DOA: 02/10/2016 PCP: No PCP Per Patient  Assessment/Plan: Active Problems:   Sickle cell pain crisis ([place])  1. Hb SS with crisis: Pt reports that pain is decreased. She reports that although prescribed Methadone q 12 hours, she takes it only on a daily basis and some days does not take it al all. She does use oral Dilaudid more frequently. Will decrease frequency of Methadone to q HS, continue Dilaudid PCA. I have added Toradol. Will re-assess impact on pain tomorrow. 2. Anemia: Hb at baseline. Hydrea held due to decreased [place]. 3. Chronic Homelessness: Will ask [facility] to assist patient. Also place a SW consult.   Code Status: Full Code Family Communication: N/A Disposition Plan: Not yet ready for discharge  [place].  Pager [phone]. If 7PM-7AM, please contact night-coverage.  02/11/2016, 2:53 PM    Interim History: Pt states that pain improved since yesterday. Pt reports chronic homelessness.   Consultants:  None  Procedures:  None  Antibiotics:  None   Objective: Filed Vitals:   02/11/16 0829 02/11/16 1034 02/11/16 1300 02/11/16 1449  BP:  92/65  102/69  Pulse:  100  98  Temp:  97.8 F (36.6 C)  97.1 F (36.2 C)  TempSrc:  Oral  Oral  Resp: 10 14 13 14   Height:      Weight:      SpO2: 97% 98% 99% 98%   Weight change:   Intake/Output Summary (Last 24 hours) at 02/11/16 1453 Last data filed at 02/11/16 1450  Gross per 24 hour  Intake 1291.8 ml  Output      0 ml  Net 1291.8 ml    General: Alert, awake, oriented x3, in no acute distress.  HEENT: [place]/AT PEERL, EOMI Neck: Trachea midline,  no masses, no thyromegal,y no JVD, no carotid bruit OROPHARYNX:  Moist, No exudate/ erythema/lesions.  Heart: Regular rate and rhythm, without murmurs, rubs, gallops, PMI non-displaced, no heaves or thrills on palpation.  Lungs: Clear to auscultation, no wheezing or  rhonchi noted. No increased vocal fremitus resonant to percussion  Abdomen: Soft, nontender, nondistended, positive bowel sounds, no masses no hepatosplenomegaly noted..  Neuro: No focal neurological deficits noted cranial nerves II through XII grossly intact. Strength    5 /5 in bilateral upper and lower extremities. Musculoskeletal: No warmth swelling or erythema around joints, no spinal tenderness noted. Psychiatric: Patient alert and oriented x3, good insight and cognition, good recent to remote recall.    Data Reviewed: Basic Metabolic Panel:  Recent Labs Lab 02/10/16 1600  NA 137  K 3.9  CL 106  CO2 22  GLUCOSE 95  BUN 7  CREATININE 0.70  CALCIUM 8.5*   Liver Function Tests:  Recent Labs Lab 02/10/16 1600  AST 23  ALT 22  ALKPHOS 110  BILITOT 1.1  PROT 8.2*  ALBUMIN 4.1   No results for input(s): LIPASE, AMYLASE in the last 168 hours. No results for input(s): AMMONIA in the last 168 hours. CBC:  Recent Labs Lab 02/10/16 1600  WBC 4.5  NEUTROABS 1.8  HGB 9.9*  HCT 27.0*  MCV 107.1*  PLT 364   Cardiac Enzymes:  Recent Labs Lab 02/10/16 1600  TROPONINI <0.03   BNP (last 3 results) No results for input(s): BNP in the last 8760 hours.  ProBNP (last 3 results) No results for input(s): PROBNP in the last 8760 hours.  CBG: No results for input(s): GLUCAP in the last 168  hours.  No results found for this or any previous visit (from the past 240 hour(s)).   Studies: Dg Chest 2 View  02/10/2016  CLINICAL DATA:  Cough fever and body aches for 3 weeks EXAM: CHEST  2 VIEW COMPARISON:  02/02/2016 CT scan FINDINGS: The heart size and vascular pattern are normal. No infiltrate or consolidation. No pleural effusion. IMPRESSION: No active cardiopulmonary disease. Electronically Signed   By: Skipper Cliche M.D.   On: 02/10/2016 15:17   Ct Angio Chest Pe W/cm &/or Wo Cm  02/02/2016  CLINICAL DATA:  Sickle cell crisis. Arm pain and back pain. Bilateral leg  pain. EXAM: CT ANGIOGRAPHY CHEST WITH CONTRAST TECHNIQUE: Multidetector CT imaging of the chest was performed using the standard protocol during bolus administration of intravenous contrast. Multiplanar CT image reconstructions and MIPs were obtained to evaluate the vascular anatomy. CONTRAST:  164mL OMNIPAQUE IOHEXOL 350 MG/ML SOLN COMPARISON:  None. FINDINGS: Mediastinum/Nodes: No filling defects within the pulmonary arteries to clearly localize pulmonary emboli. There is a focus of low density within the LEFT proximal lower lobe pulmonary artery (image 101, series 7). This is not in the typical shape of pulmonary embolism and felt to represent attenuation artifact. The more distal vessel of fills normally. No additional evidence of pulmonary emboli. 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 RIGHT antecubital fossa injection. No acute findings aorta great vessels.  No pericardial fluid. No mediastinal hilar lymphadenopathy.  Esophagus is normal. Lungs/Pleura: There is mild linear reticular pattern in the LEFT and RIGHT lung base. There is atelectasis in the inferior lingula. There is no airspace disease. No ground-glass opacities. No infarction. Upper abdomen: Limited view of the liver, kidneys, pancreas are unremarkable. Normal adrenal glands. The spleen is small and calcified Musculoskeletal: No diffuse sclerosis of the bones per Review of the MIP images confirms the above findings. IMPRESSION: 1. No evidence acute pulmonary embolism. 2. Low-attenuation focus within proximal LEFT main pulmonary artery is felt to represent an attenuation artifact. 3. 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 4. No evidence of pulmonary infection or infarction. 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 5. Autoinfarction the spleen. 6. diffuse skeletal sclerosis consistent sickle cell. Electronically Signed   By: Suzy Bouchard M.D.   On: 02/02/2016 18:34    Scheduled Meds: . folic acid  1 mg Oral Daily  . HYDROmorphone   Intravenous 6 times per day  . ketorolac  30 mg Intravenous 4 times per day  . levETIRAcetam  1,000 mg Oral Q12H  . loratadine  10 mg Oral Daily  . methadone  10 mg Oral QHS  . rivaroxaban  20 mg Oral Daily  . senna-docusate  1 tablet Oral BID   Continuous Infusions:   Time spent 35 minutes.

## 2016-02-12 DIAGNOSIS — H0520 Unspecified exophthalmos: Secondary | ICD-10-CM

## 2016-02-12 LAB — CBC WITH DIFFERENTIAL/PLATELET
BASOS ABS: 0 10*3/uL (ref 0.0–0.1)
Basophils Relative: 0 %
Eosinophils Absolute: 0.1 10*3/uL (ref 0.0–0.7)
Eosinophils Relative: 1 %
HEMATOCRIT: 26.2 % — AB (ref 36.0–46.0)
Hemoglobin: 9.5 g/dL — ABNORMAL LOW (ref 12.0–15.0)
LYMPHS ABS: 1.7 10*3/uL (ref 0.7–4.0)
LYMPHS PCT: 28 %
MCH: 38 pg — AB (ref 26.0–34.0)
MCHC: 36.3 g/dL — ABNORMAL HIGH (ref 30.0–36.0)
MCV: 104.8 fL — AB (ref 78.0–100.0)
MONO ABS: 0.6 10*3/uL (ref 0.1–1.0)
Monocytes Relative: 9 %
NEUTROS ABS: 3.8 10*3/uL (ref 1.7–7.7)
Neutrophils Relative %: 62 %
Platelets: 346 10*3/uL (ref 150–400)
RBC: 2.5 MIL/uL — ABNORMAL LOW (ref 3.87–5.11)
RDW: 19.2 % — AB (ref 11.5–15.5)
WBC: 6.1 10*3/uL (ref 4.0–10.5)

## 2016-02-12 LAB — RETICULOCYTES
RBC.: 2.5 MIL/uL — ABNORMAL LOW (ref 3.87–5.11)
RETIC COUNT ABSOLUTE: 137.5 10*3/uL (ref 19.0–186.0)
Retic Ct Pct: 5.5 % — ABNORMAL HIGH (ref 0.4–3.1)

## 2016-02-12 LAB — TSH: TSH: 1.089 u[IU]/mL (ref 0.350–4.500)

## 2016-02-12 NOTE — Progress Notes (Signed)
[facility] PROGRESS NOTE  Heather Oconnell [id number] DOB: 1976-05-17 DOA: 02/10/2016 PCP: Bennye Alm, MD  Assessment/Plan: Active Problems:   Sickle cell pain crisis ([place])  1. Hb SS with crisis: Pt reports that pain is decreased. She has used only 7.5 mg with 16/16:demand/deliveries. Will schedule oral Dilaudid and continue PCA for PRN in anticipation of dischargre tomorrow. Continue Toradol. Will re-assess for possible discharge tomorrow. 2. Anemia: Hb at baseline. ANC improved so will resume Hydrea.. 3. 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  She is chronically on Xarelto.  4. Swelling in left shoulder: Pt has developed swelling in the left shoulder after receiving Pneumovax. Pt reports that she has not received the Prevnar vaccine. 5. Proptosis: Will check TSH particularly in light of family history of Thyroid disease. 6. Chronic Homelessness: Will ask [facility] to assist patient. Also place a SW consult.   Code Status: Full Code Family Communication: N/A Disposition Plan: Not yet ready for discharge  [place].  Pager [phone]. If 7PM-7AM, please contact night-coverage.  02/12/2016, 12:20 PM  LOS: 1 day   Interim History: Pt states that pain improved since yesterday. Pt reports chronic homelessness.   Consultants:  None  Procedures:  None  Antibiotics:  None   Objective: Filed Vitals:   02/12/16 0359 02/12/16 0457 02/12/16 0800 02/12/16 1157  BP:  124/77    Pulse:  120    Temp:  98.6 F (37 C)    TempSrc:  Oral    Resp: 13 14 11 16   Height:      Weight:  155 lb 9.6 oz (70.58 kg)    SpO2: 100% 100% 100% 100%   Weight change: 9 lb 9.6 oz (4.355 kg)  Intake/Output Summary (Last 24 hours) at 02/12/16 1220 Last data filed at 02/12/16 0606  Gross per 24 hour  Intake 1380.3 ml  Output      0 ml  Net 1380.3 ml    General: Alert, awake, oriented x3, in no acute  distress.  HEENT: [place]/AT [place], EOMI. Anicteric. Proptosis present. Neck: Trachea midline,  no masses, no thyromegal,y no JVD, no carotid bruit OROPHARYNX:  Moist, No exudate/ erythema/lesions.  Heart: Regular rate and rhythm, without murmurs, rubs, gallops, PMI non-displaced, no heaves or thrills on palpation.  Lungs: Clear to auscultation, no wheezing or rhonchi noted. No increased vocal fremitus resonant to percussion. Veins prominent on chest wall. Abdomen: Soft, nontender, nondistended, positive bowel sounds, no masses no hepatosplenomegaly noted..  Neuro: No focal neurological deficits noted cranial nerves II through XII grossly intact. Strength    5 /5 in bilateral upper and lower extremities. Musculoskeletal: No warmth swelling or erythema around joints, no spinal tenderness noted. Psychiatric: Patient alert and oriented x3, good insight and cognition, good recent to remote recall.    Data Reviewed: Basic Metabolic Panel:  Recent Labs Lab 02/10/16 1600  NA 137  K 3.9  CL 106  CO2 22  GLUCOSE 95  BUN 7  CREATININE 0.70  CALCIUM 8.5*   Liver Function Tests:  Recent Labs Lab 02/10/16 1600  AST 23  ALT 22  ALKPHOS 110  BILITOT 1.1  PROT 8.2*  ALBUMIN 4.1   No results for input(s): LIPASE, AMYLASE in the last 168 hours. No results for input(s): AMMONIA in the last 168 hours. CBC:  Recent Labs Lab 02/10/16 1600 02/12/16 0422  WBC 4.5 6.1  NEUTROABS 1.8 3.8  HGB 9.9* 9.5*  HCT 27.0* 26.2*  MCV 107.1* 104.8*  PLT 364 346   Cardiac Enzymes:  Recent Labs Lab 02/10/16 1600  TROPONINI <0.03   BNP (last 3 results) No results for input(s): BNP in the last 8760 hours.  ProBNP (last 3 results) No results for input(s): PROBNP in the last 8760 hours.  CBG: No results for input(s): GLUCAP in the last 168 hours.  No results found for this or any previous visit (from the past 240 hour(s)).   Studies: Dg Chest 2 View  02/10/2016  CLINICAL DATA:  Cough fever  and body aches for 3 weeks EXAM: CHEST  2 VIEW COMPARISON:  02/02/2016 CT scan FINDINGS: The heart size and vascular pattern are normal. No infiltrate or consolidation. No pleural effusion. IMPRESSION: No active cardiopulmonary disease. Electronically Signed   By: Skipper Cliche M.D.   On: 02/10/2016 15:17   Ct Angio Chest Pe W/cm &/or Wo Cm  02/02/2016  CLINICAL DATA:  Sickle cell crisis. Arm pain and back pain. Bilateral leg pain. EXAM: CT ANGIOGRAPHY CHEST WITH CONTRAST TECHNIQUE: Multidetector CT imaging of the chest was performed using the standard protocol during bolus administration of intravenous contrast. Multiplanar CT image reconstructions and MIPs were obtained to evaluate the vascular anatomy. CONTRAST:  129mL OMNIPAQUE IOHEXOL 350 MG/ML SOLN COMPARISON:  None. FINDINGS: Mediastinum/Nodes: No filling defects within the pulmonary arteries to clearly localize pulmonary emboli. There is a focus of low density within the LEFT proximal lower lobe pulmonary artery (image 101, series 7). This is not in the typical shape of pulmonary embolism and felt to represent attenuation artifact. The more distal vessel of fills normally. No additional evidence of pulmonary emboli. 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 RIGHT antecubital fossa injection. No acute findings aorta great vessels.  No pericardial fluid. No mediastinal hilar lymphadenopathy.  Esophagus is normal. Lungs/Pleura: There is mild linear reticular pattern in the LEFT and RIGHT lung base. There is atelectasis in the inferior lingula. There is no airspace disease. No ground-glass opacities. No infarction. Upper abdomen: Limited view of the liver, kidneys, pancreas are unremarkable. Normal adrenal glands. The spleen is small and calcified Musculoskeletal: No diffuse sclerosis of the  bones per Review of the MIP images confirms the above findings. IMPRESSION: 1. No evidence acute pulmonary embolism. 2. Low-attenuation focus within proximal LEFT main pulmonary artery is felt to represent an attenuation artifact. 3. 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 4. No evidence of pulmonary infection or infarction. 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 5. Autoinfarction the spleen. 6. diffuse skeletal sclerosis consistent sickle cell. Electronically Signed   By: Suzy Bouchard M.D.   On: 02/02/2016 18:34    Scheduled Meds: . folic acid  1 mg Oral Daily  . HYDROmorphone   Intravenous 6 times per day  . ketorolac  30 mg Intravenous 4 times per day  . levETIRAcetam  1,000 mg Oral Q12H  . loratadine  10 mg Oral Daily  . methadone  10 mg Oral QHS  . rivaroxaban  20 mg Oral Daily  . senna-docusate  1 tablet Oral BID   Continuous Infusions:   Time spent 25 minutes.

## 2016-02-13 DIAGNOSIS — K5909 Other constipation: Secondary | ICD-10-CM

## 2016-02-13 MED ORDER — HYDROMORPHONE HCL 4 MG PO TABS
4.0000 mg | ORAL_TABLET | ORAL | Status: DC
  Administered 2016-02-13 – 2016-02-15 (×13): 4 mg via ORAL
  Filled 2016-02-13 (×8): qty 1
  Filled 2016-02-13: qty 2
  Filled 2016-02-13 (×3): qty 1
  Filled 2016-02-13: qty 2

## 2016-02-13 MED ORDER — HYDROMORPHONE HCL 1 MG/ML IJ SOLN
1.0000 mg | Freq: Once | INTRAMUSCULAR | Status: AC
  Administered 2016-02-13: 1 mg via INTRAVENOUS
  Filled 2016-02-13: qty 1

## 2016-02-13 NOTE — Progress Notes (Signed)
[facility] PROGRESS NOTE  Heather Oconnell [id number] DOB: 26-Jun-1976 DOA: 02/10/2016 PCP: Bennye Alm, MD  Assessment/Plan: Active Problems:   Sickle cell pain crisis ([place])  1. Hb SS with crisis: Pt reports that pain is minimally increased in the left ribs today. Will schedule oral Dilaudid and continue PCA for PRN in anticipation of dischargre tomorrow. Continue Toradol. Will re-assess for possible discharge tomorrow. 2. Anemia: Hb at baseline. ANC improved so will resume Hydrea.. 3. 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  She is chronically on Xarelto.  4. Swelling in left shoulder: Pt has developed swelling in the left shoulder after receiving Pneumovax. Pt reports that she has not received the Prevnar vaccine. 5. Proptosis: TSH found to be within normal limits. This is likely a variant of normal on this patient. 6. Chronic Homelessness: Will ask [facility] to assist patient. Also place a SW consult. 7. Constipation: Last bowel movement prior to hospitalization. Last nurse to administer MiraLAX today.   Code Status: Full Code Family Communication: N/A Disposition Plan: Not yet ready for discharge  [place].  Pager [phone]. If 7PM-7AM, please contact night-coverage.  02/13/2016, 12:29 PM  LOS: 2 days   Interim History: Pt states that pain increased since yesterday. Pt reports chronic homelessness. Patient has not had a bowel movement since prior to hospitalization.  Consultants:  None  Procedures:  None  Antibiotics:  None   Objective: Filed Vitals:   02/13/16 0537 02/13/16 0800 02/13/16 0941 02/13/16 1200  BP: 102/59  104/65   Pulse: 97  97   Temp: 98.2 F (36.8 C)  97.2 F (36.2 C)   TempSrc: Oral  Oral   Resp: 16 18 11 14   Height:      Weight:      SpO2: 96% 95% 96% 97%   Weight change:   Intake/Output Summary (Last 24 hours) at 02/13/16 1229 Last data filed at  02/12/16 2136  Gross per 24 hour  Intake    839 ml  Output      0 ml  Net    839 ml    General: Alert, awake, oriented x3, in Mild distress secondary to pain.  HEENT: [place]/AT [place], EOMI. Anicteric. Proptosis present. Neck: Trachea midline,  no masses, no thyromegal,y no JVD, no carotid bruit OROPHARYNX:  Moist, No exudate/ erythema/lesions.  Heart: Regular rate and rhythm, without murmurs, rubs, gallops, PMI non-displaced, no heaves or thrills on palpation.  Lungs: Clear to auscultation, no wheezing or rhonchi noted. No increased vocal fremitus resonant to percussion. Veins prominent on chest wall. Abdomen: Soft, nontender, nondistended, positive bowel sounds, no masses no hepatosplenomegaly noted..  Neuro: No focal neurological deficits noted cranial nerves II through XII grossly intact. Strength    5 /5 in bilateral upper and lower extremities. Musculoskeletal: No warmth swelling or erythema around joints, no spinal tenderness noted. Psychiatric: Patient alert and oriented x3, good insight and cognition, good recent to remote recall.    Data Reviewed: Basic Metabolic Panel:  Recent Labs Lab 02/10/16 1600  NA 137  K 3.9  CL 106  CO2 22  GLUCOSE 95  BUN 7  CREATININE 0.70  CALCIUM 8.5*   Liver Function Tests:  Recent Labs Lab 02/10/16 1600  AST 23  ALT 22  ALKPHOS 110  BILITOT 1.1  PROT 8.2*  ALBUMIN 4.1   No results for input(s): LIPASE, AMYLASE in the last 168 hours. No results for input(s): AMMONIA in the  last 168 hours. CBC:  Recent Labs Lab 02/10/16 1600 02/12/16 0422  WBC 4.5 6.1  NEUTROABS 1.8 3.8  HGB 9.9* 9.5*  HCT 27.0* 26.2*  MCV 107.1* 104.8*  PLT 364 346   Cardiac Enzymes:  Recent Labs Lab 02/10/16 1600  TROPONINI <0.03   BNP (last 3 results) No results for input(s): BNP in the last 8760 hours.  ProBNP (last 3 results) No results for input(s): PROBNP in the last 8760 hours.  CBG: No results for input(s): GLUCAP in the last 168  hours.  No results found for this or any previous visit (from the past 240 hour(s)).   Studies: Dg Chest 2 View  02/10/2016  CLINICAL DATA:  Cough fever and body aches for 3 weeks EXAM: CHEST  2 VIEW COMPARISON:  02/02/2016 CT scan FINDINGS: The heart size and vascular pattern are normal. No infiltrate or consolidation. No pleural effusion. IMPRESSION: No active cardiopulmonary disease. Electronically Signed   By: Skipper Cliche M.D.   On: 02/10/2016 15:17   Ct Angio Chest Pe W/cm &/or Wo Cm  02/02/2016  CLINICAL DATA:  Sickle cell crisis. Arm pain and back pain. Bilateral leg pain. EXAM: CT ANGIOGRAPHY CHEST WITH CONTRAST TECHNIQUE: Multidetector CT imaging of the chest was performed using the standard protocol during bolus administration of intravenous contrast. Multiplanar CT image reconstructions and MIPs were obtained to evaluate the vascular anatomy. CONTRAST:  147mL OMNIPAQUE IOHEXOL 350 MG/ML SOLN COMPARISON:  None. FINDINGS: Mediastinum/Nodes: No filling defects within the pulmonary arteries to clearly localize pulmonary emboli. There is a focus of low density within the LEFT proximal lower lobe pulmonary artery (image 101, series 7). This is not in the typical shape of pulmonary embolism and felt to represent attenuation artifact. The more distal vessel of fills normally. No additional evidence of pulmonary emboli. 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 RIGHT antecubital fossa injection. No acute findings aorta great vessels.  No pericardial fluid. No mediastinal hilar lymphadenopathy.  Esophagus is normal. Lungs/Pleura: There is mild linear reticular pattern in the LEFT and RIGHT lung base. There is atelectasis in the inferior lingula. There is no airspace disease. No ground-glass opacities. No infarction. Upper abdomen: Limited  view of the liver, kidneys, pancreas are unremarkable. Normal adrenal glands. The spleen is small and calcified Musculoskeletal: No diffuse sclerosis of the bones per Review of the MIP images confirms the above findings. IMPRESSION: 1. No evidence acute pulmonary embolism. 2. Low-attenuation focus within proximal LEFT main pulmonary artery is felt to represent an attenuation artifact. 3. 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 4. No evidence of pulmonary infection or infarction. 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 5. Autoinfarction the spleen. 6. diffuse skeletal sclerosis consistent sickle cell. Electronically Signed   By: Suzy Bouchard M.D.   On: 02/02/2016 18:34    Scheduled Meds: . folic acid  1 mg Oral Daily  . HYDROmorphone   Intravenous 6 times per day  . HYDROmorphone  4 mg Oral Q4H  . ketorolac  30 mg Intravenous 4 times per day  . levETIRAcetam  1,000 mg Oral Q12H  . loratadine  10 mg Oral Daily  . methadone  10 mg Oral QHS  . rivaroxaban  20 mg Oral Daily  . senna-docusate  1 tablet Oral BID  Continuous Infusions:   Time spent 25 minutes.

## 2016-02-14 DIAGNOSIS — M6248 Contracture of muscle, other site: Secondary | ICD-10-CM

## 2016-02-14 DIAGNOSIS — Z7901 Long term (current) use of anticoagulants: Secondary | ICD-10-CM

## 2016-02-14 LAB — CBC WITH DIFFERENTIAL/PLATELET
Basophils Absolute: 0 10*3/uL (ref 0.0–0.1)
Basophils Relative: 0 %
EOS ABS: 0.1 10*3/uL (ref 0.0–0.7)
Eosinophils Relative: 1 %
HEMATOCRIT: 20.8 % — AB (ref 36.0–46.0)
HEMOGLOBIN: 7.6 g/dL — AB (ref 12.0–15.0)
LYMPHS ABS: 2.7 10*3/uL (ref 0.7–4.0)
LYMPHS PCT: 27 %
MCH: 37.4 pg — AB (ref 26.0–34.0)
MCHC: 36.5 g/dL — ABNORMAL HIGH (ref 30.0–36.0)
MCV: 102.5 fL — ABNORMAL HIGH (ref 78.0–100.0)
MONOS PCT: 9 %
Monocytes Absolute: 0.9 10*3/uL (ref 0.1–1.0)
NEUTROS ABS: 6.4 10*3/uL (ref 1.7–7.7)
NEUTROS PCT: 63 %
Platelets: 403 10*3/uL — ABNORMAL HIGH (ref 150–400)
RBC: 2.03 MIL/uL — AB (ref 3.87–5.11)
RDW: 20.9 % — ABNORMAL HIGH (ref 11.5–15.5)
WBC: 10.2 10*3/uL (ref 4.0–10.5)

## 2016-02-14 LAB — RETICULOCYTES
RBC.: 2 MIL/uL — AB (ref 3.87–5.11)
RETIC CT PCT: 8.5 % — AB (ref 0.4–3.1)
Retic Count, Absolute: 170 10*3/uL (ref 19.0–186.0)

## 2016-02-14 MED ORDER — MAGNESIUM CITRATE PO SOLN
1.0000 | Freq: Once | ORAL | Status: AC
  Administered 2016-02-14: 1 via ORAL
  Filled 2016-02-14: qty 296

## 2016-02-14 MED ORDER — HYDROMORPHONE HCL 1 MG/ML IJ SOLN: 1.0000 mg | INTRAMUSCULAR | Status: DC | PRN

## 2016-02-14 MED ORDER — METAXALONE 400 MG HALF TABLET
400.0000 mg | ORAL_TABLET | Freq: Three times a day (TID) | ORAL | Status: DC
  Administered 2016-02-14 – 2016-02-15 (×4): 400 mg via ORAL
  Filled 2016-02-14 (×5): qty 1

## 2016-02-14 NOTE — Care Management Important Message (Signed)
Important Message  Patient Details  Name: Sadielynn Onion MRN: [id number] Date of Birth: [birth year]   Medicare Important Message Given:  Yes    Camillo Flaming 02/14/2016, 10:15 AMImportant Message  Patient Details  Name: Lazariah Scollon MRN: [id number] Date of Birth: 09-12-1976   Medicare Important Message Given:  Yes    Camillo Flaming 02/14/2016, 10:15 AM

## 2016-02-14 NOTE — Progress Notes (Signed)
[facility] PROGRESS NOTE  Heather Oconnell [id number] DOB: 1976/01/24 DOA: 02/10/2016 PCP: Bennye Alm, MD  Assessment/Plan: Active Problems:   Sickle cell pain crisis ([place])  1. Hb SS with crisis: Pt reports that pain is essentially controlled on scheduled oral Dilaudid so will discontinue the PCA. However the patient is having significant spasm in the left upper trapezius and levator scapulae to the point where she is unable to the laterally bend her head to the right. I will continue Toradol and add Skelaxin on a 3 times a day basis.Marland Kitchen Hopefully will improve enough for discharge home tomorrow. 2. Anemia: Hb at baseline. [place] continues to be within normal ranges so will continue Hydrea 3. 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  She is chronically on Xarelto.  4. Swelling in left shoulder: Pt has developed swelling in the left upper arm after receiving Pneumovax. This is now resolved 5. Proptosis: TSH found to be within normal limits. This is likely a variant of normal on this patient. 6. Chronic Homelessness: Will ask [facility] to assist patient. Also place a SW consult. 7. Constipation: Last bowel movement prior to hospitalization. Despite MiraLAX and that motility agent she still has not had a bowel movement so will give magnesium citrate today.   Code Status: Full Code Family Communication: N/A Disposition Plan: Anticipate discharge tomorrow.  MATTHEWS,MICHELLE A.  Pager [phone]. If 7PM-7AM, please contact night-coverage.  02/14/2016, 5:23 PM  LOS: 3 days   Interim History: Pt states that pain has essentially resolved at this site for spasm in the left shoulder and neck. Pt reports chronic homelessness. Patient has not had a bowel movement since prior to admission.    Consultants:  None  Procedures:  None  Antibiotics:  None   Objective: Filed Vitals:   02/14/16 0523 02/14/16 0727 02/14/16  1030 02/14/16 1200  BP: 93/60  86/52   Pulse: 85  77   Temp: 97.6 F (36.4 C)  98 F (36.7 C)   TempSrc: Oral  Oral   Resp: 9 8 7 10   Height:      Weight: 157 lb 3.2 oz (71.305 kg)     SpO2: 97% 100% 100% 98%   Weight change:   Intake/Output Summary (Last 24 hours) at 02/14/16 1723 Last data filed at 02/14/16 1603  Gross per 24 hour  Intake    807 ml  Output      0 ml  Net    807 ml    General: Alert, awake, oriented x3, in Mild distress secondary to pain.  HEENT: [place]/AT [place], EOMI. Anicteric. Proptosis present. Neck: Trachea midline,  no masses, no thyromegal,y no JVD, no carotid bruit OROPHARYNX:  Moist, No exudate/ erythema/lesions.  Heart: Regular rate and rhythm, without murmurs, rubs, gallops, PMI non-displaced, no heaves or thrills on palpation.  Lungs: Clear to auscultation, no wheezing or rhonchi noted. No increased vocal fremitus resonant to percussion. Veins prominent on chest wall. Abdomen: Soft, nontender, nondistended, positive bowel sounds, no masses no hepatosplenomegaly noted..  Neuro: No focal neurological deficits noted cranial nerves II through XII grossly intact. Strength    5 /5 in bilateral upper and lower extremities. Musculoskeletal: Palpable spasm in the left neck and shoulder. No warmth swelling or erythema around joints, no spinal tenderness noted. Psychiatric: Patient alert and oriented x3, good insight and cognition, good recent to remote recall.    Data Reviewed: Basic Metabolic Panel:  Recent Labs Lab 02/10/16 1600  NA 137  K 3.9  CL 106  CO2 22  GLUCOSE 95  BUN 7  CREATININE 0.70  CALCIUM 8.5*   Liver Function Tests:  Recent Labs Lab 02/10/16 1600  AST 23  ALT 22  ALKPHOS 110  BILITOT 1.1  PROT 8.2*  ALBUMIN 4.1   No results for input(s): LIPASE, AMYLASE in the last 168 hours. No results for input(s): AMMONIA in the last 168 hours. CBC:  Recent Labs Lab 02/10/16 1600 02/12/16 0422 02/14/16 0337  WBC 4.5 6.1 10.2   NEUTROABS 1.8 3.8 6.4  HGB 9.9* 9.5* 7.6*  HCT 27.0* 26.2* 20.8*  MCV 107.1* 104.8* 102.5*  PLT 364 346 403*   Cardiac Enzymes:  Recent Labs Lab 02/10/16 1600  TROPONINI <0.03   BNP (last 3 results) No results for input(s): BNP in the last 8760 hours.  ProBNP (last 3 results) No results for input(s): PROBNP in the last 8760 hours.  CBG: No results for input(s): GLUCAP in the last 168 hours.  No results found for this or any previous visit (from the past 240 hour(s)).   Studies: Dg Chest 2 View  02/10/2016  CLINICAL DATA:  Cough fever and body aches for 3 weeks EXAM: CHEST  2 VIEW COMPARISON:  02/02/2016 CT scan FINDINGS: The heart size and vascular pattern are normal. No infiltrate or consolidation. No pleural effusion. IMPRESSION: No active cardiopulmonary disease. Electronically Signed   By: Skipper Cliche M.D.   On: 02/10/2016 15:17   Ct Angio Chest Pe W/cm &/or Wo Cm  02/02/2016  CLINICAL DATA:  Sickle cell crisis. Arm pain and back pain. Bilateral leg pain. EXAM: CT ANGIOGRAPHY CHEST WITH CONTRAST TECHNIQUE: Multidetector CT imaging of the chest was performed using the standard protocol during bolus administration of intravenous contrast. Multiplanar CT image reconstructions and MIPs were obtained to evaluate the vascular anatomy. CONTRAST:  182mL OMNIPAQUE IOHEXOL 350 MG/ML SOLN COMPARISON:  None. FINDINGS: Mediastinum/Nodes: No filling defects within the pulmonary arteries to clearly localize pulmonary emboli. There is a focus of low density within the LEFT proximal lower lobe pulmonary artery (image 101, series 7). This is not in the typical shape of pulmonary embolism and felt to represent attenuation artifact. The more distal vessel of fills normally. No additional evidence of pulmonary emboli. Non opacification of the RIGHT subclavian vein or superior vena cava, additionally there are multiple venous collaterals within the chest wall and RIGHT axilla consistent with superior  vena cava thrombosis. RIGHT antecubital fossa injection. No acute findings aorta great vessels.  No pericardial fluid. No mediastinal hilar lymphadenopathy.  Esophagus is normal. Lungs/Pleura: There is mild linear reticular pattern in the LEFT and RIGHT lung base. There is atelectasis in the inferior lingula. There is no airspace disease. No ground-glass opacities. No infarction. Upper abdomen: Limited view of the liver, kidneys, pancreas are unremarkable. Normal adrenal glands. The spleen is small and calcified Musculoskeletal: No diffuse sclerosis of the bones per Review of the MIP images confirms the above findings. IMPRESSION: 1. No evidence acute pulmonary embolism. 2. Low-attenuation focus within proximal LEFT main pulmonary artery is felt to represent an attenuation artifact. 3. 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 4. No evidence of pulmonary infection or infarction. 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 5. Autoinfarction the spleen. 6. diffuse skeletal sclerosis consistent sickle cell. Electronically Signed   By: Suzy Bouchard M.D.   On: 02/02/2016 18:34  Scheduled Meds: . folic acid  1 mg Oral Daily  . HYDROmorphone  4 mg Oral Q4H  . ketorolac  30 mg Intravenous 4 times per day  . levETIRAcetam  1,000 mg Oral Q12H  . loratadine  10 mg Oral Daily  . metaxalone  400 mg Oral TID  . methadone  10 mg Oral QHS  . rivaroxaban  20 mg Oral Daily  . senna-docusate  1 tablet Oral BID   Continuous Infusions:   Time spent 25 minutes.

## 2016-02-15 ENCOUNTER — Inpatient Hospital Stay (HOSPITAL_COMMUNITY): Payer: Medicare Other

## 2016-02-15 DIAGNOSIS — D638 Anemia in other chronic diseases classified elsewhere: Secondary | ICD-10-CM

## 2016-02-15 DIAGNOSIS — M7989 Other specified soft tissue disorders: Secondary | ICD-10-CM

## 2016-02-15 MED ORDER — ONDANSETRON HCL 4 MG/2ML IJ SOLN
4.0000 mg | Freq: Four times a day (QID) | INTRAMUSCULAR | Status: DC | PRN
  Administered 2016-02-15: 4 mg via INTRAVENOUS
  Filled 2016-02-15: qty 2

## 2016-02-15 MED ORDER — HYDROMORPHONE HCL 4 MG PO TABS: 4.0000 mg | ORAL_TABLET | ORAL | Status: DC | PRN

## 2016-02-15 NOTE — Progress Notes (Signed)
*  Preliminary Results* Left upper extremity venous duplex completed. Left upper extremity is negative for deep and superficial vein thrombosis.  02/15/2016 4:05 PM  Maudry Mayhew, RVT, RDCS, RDMS

## 2016-02-15 NOTE — Discharge Summary (Signed)
Heather Oconnell MRN: [id number] DOB/AGE: 42-Dec-1977 42 y.o.  Admit date: 02/10/2016 Discharge date: 02/15/2016  Primary Care Physician:  No PCP Per Patient   Discharge Diagnoses:   Patient Active Problem List   Diagnosis Date Noted  . Sickle cell pain crisis ([place]) 02/10/2016    DISCHARGE MEDICATION:   Medication List    TAKE these medications        cetirizine 10 MG tablet  Commonly known as:  ZYRTEC  Take 10 mg by mouth daily.     folic acid 1 MG tablet  Commonly known as:  FOLVITE  Take 1 mg by mouth daily.     HYDROmorphone 4 MG tablet  Commonly known as:  DILAUDID  Take 1 tablet (4 mg total) by mouth every 4 (four) hours as needed for severe pain.     hydroxyurea 500 MG capsule  Commonly known as:  HYDREA  Take 1,000 mg by mouth daily. May take with food to minimize GI side effects.     levETIRAcetam 500 MG tablet  Commonly known as:  KEPPRA  Take 1,000 mg by mouth every 12 (twelve) hours.     methadone 10 MG tablet  Commonly known as:  DOLOPHINE  Take 10 mg by mouth every 12 (twelve) hours.     rivaroxaban 20 MG Tabs tablet  Commonly known as:  XARELTO  Take 20 mg by mouth daily.          Consults:     SIGNIFICANT DIAGNOSTIC STUDIES:  Dg Chest 2 View  02/10/2016  CLINICAL DATA:  Cough fever and body aches for 3 weeks EXAM: CHEST  2 VIEW COMPARISON:  02/02/2016 CT scan FINDINGS: The heart size and vascular pattern are normal. No infiltrate or consolidation. No pleural effusion. IMPRESSION: No active cardiopulmonary disease. Electronically Signed   By: Skipper Cliche M.D.   On: 02/10/2016 15:17   Ct Angio Chest Pe W/cm &/or Wo Cm  02/02/2016  CLINICAL DATA:  Sickle cell crisis. Arm pain and back pain. Bilateral leg pain. EXAM: CT ANGIOGRAPHY CHEST WITH CONTRAST TECHNIQUE: Multidetector CT imaging of the chest was performed using the standard protocol during bolus administration of intravenous contrast. Multiplanar CT image reconstructions and MIPs were  obtained to evaluate the vascular anatomy. CONTRAST:  143mL OMNIPAQUE IOHEXOL 350 MG/ML SOLN COMPARISON:  None. FINDINGS: Mediastinum/Nodes: No filling defects within the pulmonary arteries to clearly localize pulmonary emboli. There is a focus of low density within the LEFT proximal lower lobe pulmonary artery (image 101, series 7). This is not in the typical shape of pulmonary embolism and felt to represent attenuation artifact. The more distal vessel of fills normally. No additional evidence of pulmonary emboli. 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 RIGHT antecubital fossa injection. No acute findings aorta great vessels.  No pericardial fluid. No mediastinal hilar lymphadenopathy.  Esophagus is normal. Lungs/Pleura: There is mild linear reticular pattern in the LEFT and RIGHT lung base. There is atelectasis in the inferior lingula. There is no airspace disease. No ground-glass opacities. No infarction. Upper abdomen: Limited view of the liver, kidneys, pancreas are unremarkable. Normal adrenal glands. The spleen is small and calcified Musculoskeletal: No diffuse sclerosis of the bones per Review of the MIP images confirms the above findings. IMPRESSION: 1. No evidence acute pulmonary embolism. 2. Low-attenuation focus within proximal LEFT main pulmonary artery is felt to represent  an attenuation artifact. 3. 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 4. No evidence of pulmonary infection or infarction. 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 5. Autoinfarction the spleen. 6. diffuse skeletal sclerosis consistent sickle cell. Electronically Signed   By: Suzy Bouchard M.D.   On: 02/02/2016 18:34      No results found for this or any previous visit (from  the past 240 hour(s)).  BRIEF ADMITTING H & P: Heather Oconnell is a 40 y.o. 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 Patient presents to the ED at [facility] with c/o cough, chest pain, back pain, bilateral leg pain. Symptoms are typical of her sickle cell crisis. Present for past 3 weeks with worsening 2 days ago. No fever, chills, cough, nausea, vomiting, SOB. Does have appointment with sickle cell clinic but not until end of month.   Hospital Course:  Present on Admission:  . Sickle cell pain crisis [facility]): Patient's pain was managed initially with Dilaudid via PCA, Toradol and IV fluids. During the course of her hospitalization she developed spasm in the neck and Skelaxin was added to her regimen of medications. She was being weaned off her pain medication when she had a re-escalation of her pain likely triggered by the change in weather. Her therapy was intensifies by adding clinician assisted doses of IV Dilaudid. This regimen resulted in a significant decrease in her pain and she was transitioned to oral analgesics prior to being discharged home. . Left upper extremity swelling: During her hospitalization she developed swelling of the left upper extremity. An ultrasound of the left upper extremity was performed to evaluate for DVTs as the patient reported she been without her anticoagulants for several days prior to admission.it was  negative for any DVTs. The swelling subsided with application of warm compression. . Anemia of chronic disease: Hemoglobin remained at baseline throughout hospital stay and no transfusions were required. . Chronic anticoagulation: Patient has a history of chronic recurrent DVTs. She was on Xarelto throughout her hospital stay and at the time of discharge. . Constipation: The patient was constipated throughout most of her hospital stay. She received several laxatives with results only after receiving magnesium citrate. She  finally had a bowel movement on the day prior to discharge. . Chronic homelessness: The patient is currently residing in a hotel room in the [place] area of [place]. Currently she is visiting her grandsons grandfather on his father's side. A case management consult was initiated for the patient.  Disposition and Follow-up:  Patient is discharged in stable condition. She follows with Dr. Chrissie Noa the time for her sickle cell and has an appointment scheduled in April. She also has an appointment scheduled sickle cell center here in [place] for 02/28/2016. However the patient resides in [facility].     Discharge Instructions    Activity as tolerated - No restrictions    Complete by:  As directed      Diet - low sodium heart healthy    Complete by:  As directed            DISCHARGE EXAM:  General: Alert, awake, oriented x3, in no apparent distress. Well appearing  Vital signs: BP 100/61, HR 86, T 97.9 F (36.6 C), temperature source Oral, RR16, height 5\' 8"  (1.727 m), weight 159 lb 4.8 oz (72.258 kg), last menstrual period 02/10/2016, SpO2 94 %. HEENT: [place]/AT  PEERL, EOMI, anicteric, proptosis present bilaterally Neck: Trachea midline, no masses, no thyromegal,y no JVD, no carotid bruit OROPHARYNX: Moist, No exudate/ erythema/lesions.  Heart: Regular rate and rhythm, without murmurs, rubs, gallops or S3. PMI non-displaced. Exam reveals no decreased pulses. Pulmonary/Chest: Normal effort. Breath sounds normal. No. Apnea. Clear to auscultation,no stridor,  no wheezing and no rhonchi noted. No respiratory distress and no tenderness noted. Abdomen: Soft, nontender, nondistended, normal bowel sounds, no masses no hepatosplenomegaly noted. No fluid wave and no ascites. There is no guarding or rebound. Neuro: Alert and oriented to person, place and time. Normal motor skills, Displays no atrophy or tremors and exhibits normal muscle tone.  No focal neurological deficits  noted cranial nerves II through XII grossly intact. No sensory deficit noted. Strength at baseline in bilateral upper and lower extremities. Gait normal. Musculoskeletal: No warm swelling or erythema around joints, however some swelling present in the soft tissue of the left arm. No spinal tenderness noted.  Psychiatric: Patient alert and oriented x3, good insight and cognition, good recent to remote recall. Mood, memory, affect and judgement normal Lymph node survey: No cervical axillary or inguinal lymphadenopathy noted. Skin: Skin is warm and dry. No bruising, no ecchymosis and no rash noted. Pt is not diaphoretic. No erythema. No pallor     No results for input(s): NA, K, CL, CO2, GLUCOSE, BUN, CREATININE, CALCIUM, MG, PHOS in the last 72 hours. No results for input(s): AST, ALT, ALKPHOS, BILITOT, PROT, ALBUMIN in the last 72 hours. No results for input(s): LIPASE, AMYLASE in the last 72 hours.  Recent Labs  02/14/16 0337  WBC 10.2  NEUTROABS 6.4  HGB 7.6*  HCT 20.8*  MCV 102.5*  PLT 403*     Total time spent including face to face and decision making was greater than 30 minutes  Signed: Platon Arocho A. 02/15/2016, 4:44 PM

## 2016-02-15 NOTE — Care Management Note (Signed)
Case Management Note  Patient Details  Name: Kamilya Ditoro MRN: [id number] Date of Birth: 25-Oct-1976  Subjective/Objective:           40 yo admitted with [facility]         Action/Plan: Pt recently relocated here from [facility] and is staying at the [facility].  Expected Discharge Date:  02/17/16               Expected Discharge Plan:  Home/Self Care  In-House Referral:     Discharge planning Services  CM Consult  Post Acute Care Choice:    Choice offered to:     DME Arranged:    DME Agency:     HH Arranged:    HH Agency:     Status of Service:  In process, will continue to follow  Medicare Important Message Given:  Yes Date Medicare IM Given:    Medicare IM give by:    Date Additional Medicare IM Given:    Additional Medicare Important Message give by:     If discussed at [place] of Stay Meetings, dates discussed:    Additional Comments: Pt reports no local PCP but does have an appointment at the [facility] for 02/28/16. Lynnell Catalan, RN 02/15/2016, 1:43 PM

## 2016-02-28 ENCOUNTER — Ambulatory Visit: Payer: Medicare Other | Admitting: Family Medicine

## 2016-03-20 ENCOUNTER — Emergency Department (HOSPITAL_BASED_OUTPATIENT_CLINIC_OR_DEPARTMENT_OTHER)
Admit: 2016-03-20 | Discharge: 2016-03-20 | Disposition: A | Discharge status: Home / Self Care | Payer: Medicare Other | Attending: Emergency Medicine | Admitting: Emergency Medicine

## 2016-03-20 ENCOUNTER — Emergency Department (HOSPITAL_COMMUNITY)
Admission: EM | Admit: 2016-03-20 | Discharge: 2016-03-20 | Disposition: A | Discharge status: Home / Self Care | Payer: Medicare Other | Attending: Emergency Medicine | Admitting: Emergency Medicine

## 2016-03-20 ENCOUNTER — Encounter (HOSPITAL_COMMUNITY): Payer: Self-pay | Admitting: Oncology

## 2016-03-20 ENCOUNTER — Emergency Department (HOSPITAL_COMMUNITY): Payer: Medicare Other

## 2016-03-20 DIAGNOSIS — D5700 Hb-SS disease with crisis, unspecified: Secondary | ICD-10-CM | POA: Diagnosis not present

## 2016-03-20 DIAGNOSIS — Z79899 Other long term (current) drug therapy: Secondary | ICD-10-CM | POA: Diagnosis not present

## 2016-03-20 DIAGNOSIS — Z3202 Encounter for pregnancy test, result negative: Secondary | ICD-10-CM | POA: Diagnosis not present

## 2016-03-20 DIAGNOSIS — Z91040 Latex allergy status: Secondary | ICD-10-CM | POA: Insufficient documentation

## 2016-03-20 DIAGNOSIS — M546 Pain in thoracic spine: Secondary | ICD-10-CM | POA: Diagnosis not present

## 2016-03-20 DIAGNOSIS — Z8673 Personal history of transient ischemic attack (TIA), and cerebral infarction without residual deficits: Secondary | ICD-10-CM | POA: Insufficient documentation

## 2016-03-20 DIAGNOSIS — M79609 Pain in unspecified limb: Secondary | ICD-10-CM | POA: Diagnosis not present

## 2016-03-20 LAB — COMPREHENSIVE METABOLIC PANEL
ALBUMIN: 4.2 g/dL (ref 3.5–5.0)
ALT: 12 U/L — ABNORMAL LOW (ref 14–54)
ANION GAP: 13 (ref 5–15)
AST: 20 U/L (ref 15–41)
Alkaline Phosphatase: 107 U/L (ref 38–126)
BILIRUBIN TOTAL: 1.3 mg/dL — AB (ref 0.3–1.2)
BUN: 6 mg/dL (ref 6–20)
CALCIUM: 9.3 mg/dL (ref 8.9–10.3)
CO2: 20 mmol/L — ABNORMAL LOW (ref 22–32)
Chloride: 108 mmol/L (ref 101–111)
Creatinine, Ser: 0.78 mg/dL (ref 0.44–1.00)
GFR calc non Af Amer: >60 mL/min (ref >60)
GLUCOSE: 102 mg/dL — AB (ref 65–99)
Potassium: 3.6 mmol/L (ref 3.5–5.1)
SODIUM: 141 mmol/L (ref 135–145)
TOTAL PROTEIN: 8.1 g/dL (ref 6.5–8.1)

## 2016-03-20 LAB — TROPONIN I

## 2016-03-20 LAB — CBC WITH DIFFERENTIAL/PLATELET
BASOS ABS: 0 10*3/uL (ref 0.0–0.1)
Basophils Relative: 0 %
EOS ABS: 0.1 10*3/uL (ref 0.0–0.7)
Eosinophils Relative: 1 %
HCT: 26.4 % — ABNORMAL LOW (ref 36.0–46.0)
HEMOGLOBIN: 9.5 g/dL — AB (ref 12.0–15.0)
LYMPHS PCT: 33 %
Lymphs Abs: 2.4 10*3/uL (ref 0.7–4.0)
MCH: 37 pg — ABNORMAL HIGH (ref 26.0–34.0)
MCHC: 36 g/dL (ref 30.0–36.0)
MCV: 102.7 fL — ABNORMAL HIGH (ref 78.0–100.0)
MONO ABS: 0.9 10*3/uL (ref 0.1–1.0)
MONOS PCT: 12 %
NEUTROS PCT: 54 %
NRBC: 1 /100{WBCs} — AB
Neutro Abs: 4 10*3/uL (ref 1.7–7.7)
PLATELETS: 449 10*3/uL — AB (ref 150–400)
RBC: 2.57 MIL/uL — AB (ref 3.87–5.11)
RDW: 16.9 % — ABNORMAL HIGH (ref 11.5–15.5)
WBC: 7.4 10*3/uL (ref 4.0–10.5)

## 2016-03-20 LAB — RETICULOCYTES
RBC.: 2.57 MIL/uL — AB (ref 3.87–5.11)
RETIC COUNT ABSOLUTE: 164.5 10*3/uL (ref 19.0–186.0)
RETIC CT PCT: 6.4 % — AB (ref 0.4–3.1)

## 2016-03-20 LAB — POC URINE PREG, ED: PREG TEST UR: NEGATIVE

## 2016-03-20 MED ORDER — HYDROMORPHONE HCL 2 MG/ML IJ SOLN: 0.0250 mg/kg | INTRAMUSCULAR | Status: DC

## 2016-03-20 MED ORDER — ONDANSETRON HCL 4 MG/2ML IJ SOLN
4.0000 mg | Freq: Once | INTRAMUSCULAR | Status: AC
  Administered 2016-03-20: 4 mg via INTRAVENOUS
  Filled 2016-03-20: qty 2

## 2016-03-20 MED ORDER — SODIUM CHLORIDE 0.45 % IV SOLN: INTRAVENOUS | Status: DC

## 2016-03-20 MED ORDER — DIPHENHYDRAMINE HCL 50 MG/ML IJ SOLN
25.0000 mg | Freq: Once | INTRAMUSCULAR | Status: AC
  Administered 2016-03-20: 25 mg via INTRAVENOUS
  Filled 2016-03-20: qty 1

## 2016-03-20 MED ORDER — HYDROMORPHONE HCL 2 MG/ML IJ SOLN
2.0000 mg | Freq: Once | INTRAMUSCULAR | Status: AC
  Administered 2016-03-20: 2 mg via INTRAVENOUS
  Filled 2016-03-20: qty 1

## 2016-03-20 MED ORDER — DIPHENHYDRAMINE HCL 50 MG/ML IJ SOLN: 25.0000 mg | Freq: Once | INTRAMUSCULAR | Status: DC

## 2016-03-20 MED ORDER — ONDANSETRON HCL 4 MG/2ML IJ SOLN: 4.0000 mg | INTRAMUSCULAR | Status: DC | PRN

## 2016-03-20 MED ORDER — KETOROLAC TROMETHAMINE 30 MG/ML IJ SOLN
30.0000 mg | Freq: Once | INTRAMUSCULAR | Status: AC
  Administered 2016-03-20: 30 mg via INTRAVENOUS
  Filled 2016-03-20: qty 1

## 2016-03-20 MED ORDER — HYDROMORPHONE HCL 2 MG/ML IJ SOLN
0.0250 mg/kg | INTRAMUSCULAR | Status: DC
Start: 2016-03-20 — End: 2016-03-20

## 2016-03-20 MED ORDER — DEXTROSE-NACL 5-0.45 % IV SOLN
INTRAVENOUS | Status: DC
  Administered 2016-03-20: 06:00:00 via INTRAVENOUS

## 2016-03-20 MED ORDER — HYDROMORPHONE HCL 2 MG/ML IJ SOLN
2.0000 mg | INTRAMUSCULAR | Status: AC | PRN
  Administered 2016-03-20 (×2): 2 mg via INTRAVENOUS
  Filled 2016-03-20 (×2): qty 1

## 2016-03-20 NOTE — Discharge Instructions (Signed)
Continue your home medications. Call your PCP at the sickle cell center to schedule an appointment. Return to ED with new, worsening or concerning symptoms.    Sickle Cell Anemia, Adult Sickle cell anemia is a condition in which red blood cells have an abnormal "sickle" shape. This abnormal shape shortens the cells' life span, which results in a lower than normal concentration of red blood cells in the blood. The sickle shape also causes the cells to clump together and block free blood flow through the blood vessels. As a result, the tissues and organs of the body do not receive enough oxygen. Sickle cell anemia causes organ damage and pain and increases the risk of infection. CAUSES  Sickle cell anemia is a genetic disorder. Those who receive two copies of the gene have the condition, and those who receive one copy have the trait. RISK FACTORS The sickle cell gene is most common in people whose families originated in [place]. Other areas of the globe where sickle cell trait occurs include the [place], [place] and [place], and the [place].  SIGNS AND SYMPTOMS  Pain, especially in the extremities, back, chest, or abdomen (common). The pain may start suddenly or may develop following an illness, especially if there is dehydration. Pain can also occur due to overexertion or exposure to extreme temperature changes.  Frequent severe bacterial infections, especially certain types of pneumonia and meningitis.  Pain and swelling in the hands and feet.  Decreased activity.   Loss of appetite.   Change in behavior.  Headaches.  Seizures.  Shortness of breath or difficulty breathing.  Vision changes.  Skin ulcers. Those with the trait may not have symptoms or they may have mild symptoms.  DIAGNOSIS  Sickle cell anemia is diagnosed with blood tests that demonstrate the genetic trait. It is often diagnosed during the newborn period, due to mandatory testing  nationwide. A variety of blood tests, X-rays, CT scans, MRI scans, ultrasounds, and lung function tests may also be done to monitor the condition. TREATMENT  Sickle cell anemia may be treated with:  Medicines. You may be given pain medicines, antibiotic medicines (to treat and prevent infections) or medicines to increase the production of certain types of hemoglobin.  Fluids.  Oxygen.  Blood transfusions. HOME CARE INSTRUCTIONS   Drink enough fluid to keep your urine clear or pale yellow. Increase your fluid intake in hot weather and during exercise.  Do not smoke. Smoking lowers oxygen levels in the blood.   Only take over-the-counter or prescription medicines for pain, fever, or discomfort as directed by your health care provider.  Take antibiotics as directed by your health care provider. Make sure you finish them it even if you start to feel better.   Take supplements as directed by your health care provider.   Consider wearing a medical alert bracelet. This tells anyone caring for you in an emergency of your condition.   When traveling, keep your medical information, health care provider's names, and the medicines you take with you at all times.   If you develop a fever, do not take medicines to reduce the fever right away. This could cover up a problem that is developing. Notify your health care provider.  Keep all follow-up appointments with your health care provider. Sickle cell anemia requires regular medical care. SEEK MEDICAL CARE IF: You have a fever. SEEK IMMEDIATE MEDICAL CARE IF:   You feel dizzy or faint.   You have new abdominal pain, especially  on the left side near the stomach area.   You develop a persistent, often uncomfortable and painful penile erection (priapism). If this is not treated immediately it will lead to impotence.   You have numbness your arms or legs or you have a hard time moving them.   You have a hard time with speech.    You have a fever or persistent symptoms for more than 2-3 days.   You have a fever and your symptoms suddenly get worse.   You have signs or symptoms of infection. These include:   Chills.   Abnormal tiredness (lethargy).   Irritability.   Poor eating.   Vomiting.   You develop pain that is not helped with medicine.   You develop shortness of breath.  You have pain in your chest.   You are coughing up pus-like or bloody sputum.   You develop a stiff neck.  Your feet or hands swell or have pain.  Your abdomen appears bloated.  You develop joint pain. MAKE SURE YOU:  Understand these instructions.   This information is not intended to replace advice given to you by your health care provider. Make sure you discuss any questions you have with your health care provider.   Document Released: 02/28/2006 Document Revised: 12/11/2014 Document Reviewed: 07/02/2013 Elsevier Interactive Patient Education [employer].

## 2016-03-20 NOTE — Progress Notes (Signed)
*  Preliminary Results* Left upper extremity venous duplex completed. Left upper extremity is negative for acute deep and superficial vein thrombosis. 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  03/20/2016 9:36 AM  Maudry Mayhew, RVT, RDCS, RDMS

## 2016-03-20 NOTE — ED Notes (Signed)
Awake. Verbally responsive. A/O x4. Resp even and unlabored. No audible adventitious breath sounds noted. ABC's intact.  

## 2016-03-20 NOTE — ED Notes (Signed)
Pt reports her typical sickle cell pain to her legs and back since Friday.  Pt rates pain 10/10.

## 2016-03-20 NOTE — ED Notes (Signed)
Pt reported to continue to have pain in her lt arm. Denies trauma/injury. (+)PMS, CRT brisk, noted swelling, LROM, no obvious deformity/bruising noted. Pt reported recent travel to [place].

## 2016-03-20 NOTE — ED Provider Notes (Signed)
CSN: [id number]     Arrival date & time 03/20/16  0457 History   First MD Initiated Contact with Patient 03/20/16 0557     Chief Complaint  Patient presents with  . Sickle Cell Pain Crisis   HPI  Ms. Yearian is a 40 year old female with PMHx of sickle cell disease, stroke and blood clots presenting with sickle cell pain. Pt reports back pain and bilateral leg pain for the past few weeks. She states this is typical of her sickle cell pain. She reports onset of left arm pain from her thumb to her left sided chest last evening. She states that this is not typical of her normal crises. She states that she was visiting family yesterday and they noted that her left arm looked significantly more swollen than her right. She also notes a red patch that appeared on her AC fossa upon waking. She denies trauma or injury to the left arm. She does note that she carried some grocery bags over her left elbow yesterday. She has taken her home Rx for dilaudid and tylenol which she states has not improved her pain. She reports compliance with her other home Rx. She reports a history of blood clots and one stroke in 2002. She states she used to have ports in place but they would clot off so they had to be removed. She recently moved to [facility] and reports having a PCP in the area, Dr. Zigmund Daniel. She has no other complaints today. Denies fevers, chills, headaches, facial drooping, speech slur, weakness of the extremities, vision changes, dizziness, syncope, URI symptoms, cough, wheezing, SOB, abdominal pain, nausea, vomiting. She notes persistent numbness in the left arm from her stroke in 2002.  Chart review: pt seen twice in March for similar presentation with one admission. Had CT angio chest without evidence of PE. Had [place] of LUE which was negative for clot. Admitted for pain control.   Past Medical History  Diagnosis Date  . Sickle cell disease ([place])   . Seizures ([place])   . Stroke ([place])   . Blood clot due to device,  implant, or graft    History reviewed. No pertinent past surgical history. No family history on file. Social History  Substance Use Topics  . Smoking status: Never Smoker   . Smokeless tobacco: Never Used  . Alcohol Use: No   OB History    No data available     Review of Systems  All other systems reviewed and are negative.     Allergies  Demerol; Fentanyl; Influenza vaccines; Oxycontin; and Latex  Home Medications   Prior to Admission medications   Medication Sig Start Date End Date Taking? Authorizing Provider  cetirizine (ZYRTEC) 10 MG tablet Take 10 mg by mouth daily.   Yes Historical Provider, MD  folic acid (FOLVITE) 1 MG tablet Take 1 mg by mouth daily.   Yes Historical Provider, MD  HYDROmorphone (DILAUDID) 4 MG tablet Take 1 tablet (4 mg total) by mouth every 4 (four) hours as needed for severe pain. 02/15/16  Yes Leana Gamer, MD  hydroxyurea (HYDREA) 500 MG capsule Take 1,000 mg by mouth daily. May take with food to minimize GI side effects.   Yes Historical Provider, MD  levETIRAcetam (KEPPRA) 500 MG tablet Take 1,000 mg by mouth every 12 (twelve) hours.   Yes Historical Provider, MD  rivaroxaban (XARELTO) 20 MG TABS tablet Take 20 mg by mouth daily.   Yes Historical Provider, MD   BP [phone]  mmHg  Pulse 85  Temp(Src) 98 F (36.7 C) (Oral)  Resp 18  Ht 5\' 8"  (1.727 m)  Wt 70.761 kg  BMI 23.73 kg/m2  SpO2 94%  LMP 03/19/2016 (Exact Date) Physical Exam  Constitutional: She appears well-developed and well-nourished. No distress.  NAD  HENT:  Head: Normocephalic and atraumatic.  Mouth/Throat: Oropharynx is clear and moist. No oropharyngeal exudate.  Eyes: Conjunctivae and EOM are normal. Right eye exhibits no discharge. Left eye exhibits no discharge. No scleral icterus.  Neck: Normal range of motion. Neck supple.  Cardiovascular: Normal rate, regular rhythm, normal heart sounds and intact distal pulses.   Pedal and radial pulses palpable. Cap refill <  3 seconds  Pulmonary/Chest: Effort normal and breath sounds normal. No respiratory distress. She has no wheezes.  Abdominal: Soft. She exhibits no distension. There is no tenderness.  Musculoskeletal: Normal range of motion.  Generalized TTP over the LUE with mild swelling of extremity when compared to right. Small erythematous patch with diameter of 1 cm noted to [facility] fossa. No streaking or warmth. All joints are supple with FROM. Generalized TTP over the thoracic and lumbar regions. No focal tenderness over T or L spine. No bony deformities. FROM of spine intact. No TTP over BLE. All joints are supple with FROM. No posterior calf tenderness.  Neurological: She is alert. Coordination normal.  5/5 strength of major muscle groups. Sensation to light touch intact throughout.   Skin: Skin is warm and dry.  Psychiatric: She has a normal mood and affect. Her behavior is normal.  Nursing note and vitals reviewed.   ED Course  Procedures (including critical care time) Labs Review Labs Reviewed  COMPREHENSIVE METABOLIC PANEL - Abnormal; Notable for the following:    CO2 20 (*)    Glucose, Bld 102 (*)    ALT 12 (*)    Total Bilirubin 1.3 (*)    All other components within normal limits  CBC WITH DIFFERENTIAL/PLATELET - Abnormal; Notable for the following:    RBC 2.57 (*)    Hemoglobin 9.5 (*)    HCT 26.4 (*)    MCV 102.7 (*)    MCH 37.0 (*)    RDW 16.9 (*)    Platelets 449 (*)    nRBC 1 (*)    All other components within normal limits  RETICULOCYTES - Abnormal; Notable for the following:    Retic Ct Pct 6.4 (*)    RBC. 2.57 (*)    All other components within normal limits  TROPONIN I  POC URINE PREG, ED    Imaging Review Dg Chest 2 View  03/20/2016  CLINICAL DATA:  Upper back pain.  Sickle cell crisis. EXAM: CHEST  2 VIEW COMPARISON:  02/10/2016 FINDINGS: The heart size and pulmonary vascularity are normal and the lungs are clear. Chronic blunting of the left costophrenic angle. No  acute bone abnormality. Chronic sclerosis at the right humeral head. IMPRESSION: No acute abnormalities. Electronically Signed   By: Lorriane Shire M.D.   On: 03/20/2016 07:18   I have personally reviewed and evaluated these images and lab results as part of my medical decision-making.   EKG Interpretation   Date/Time:  Monday March 20 2016 07:08:34 EDT Ventricular Rate:  76 PR Interval:  162 QRS Duration: 96 QT Interval:  403 QTC Calculation: 453 R Axis:   42 Text Interpretation:  Sinus rhythm Abnormal R-wave progression, early  transition Confirmed by [facility]  MD, APRIL ([id number]) on 03/20/2016  7:18:00 AM  MDM   Final diagnoses:  Sickle cell pain crisis ([place])   40 year old female presenting with sickle cell pain including left sided chest, left upper extremity, back and bilateral lower extremities. Failed home medications. Afebrile and hemodynamically stable. Patient is in no acute distress. Generalized tenderness over the left upper extremity. Left arm is neurovascularly intact with full range of motion. Some mild swelling noted to the left arm when compared to the right. Bilateral lower extremities are neurovascularly intact with full range of motion. No tenderness to palpation. No posterior calf tenderness. Generalized tenderness in the back without focal pain over the thoracic or lumbar spine. History of multiple blood clots. Given swelling and tenderness of the left upper extremity, vascular ultrasound done. Negative for acute DVT but does show chronic mild blood clot. Labs appear to be patient's baseline. Negative troponin and nonischemic EKG. Chest x-ray negative for acute chest.. Given IV fluids, Dilaudid, Toradol, Zofran and Benadryl. After second dose of Dilaudid, patient states that her pain has improved and she is ready to be discharged home. Encouraged patient to call her sickle cell provider and schedule a follow-up appointment. Patient is stable for  discharge.     Josephina Gip, PA-C 03/20/16 1130  April Palumbo, MD 03/22/16 2347

## 2016-03-24 ENCOUNTER — Inpatient Hospital Stay (HOSPITAL_BASED_OUTPATIENT_CLINIC_OR_DEPARTMENT_OTHER)
Admission: EM | Admit: 2016-03-24 | Discharge: 2016-03-30 | DRG: 811 | Disposition: A | Discharge status: Home / Self Care | Payer: Medicare Other | Attending: Internal Medicine | Admitting: Internal Medicine

## 2016-03-24 ENCOUNTER — Encounter (HOSPITAL_BASED_OUTPATIENT_CLINIC_OR_DEPARTMENT_OTHER): Payer: Self-pay | Admitting: Emergency Medicine

## 2016-03-24 DIAGNOSIS — M549 Dorsalgia, unspecified: Secondary | ICD-10-CM | POA: Diagnosis not present

## 2016-03-24 DIAGNOSIS — D5700 Hb-SS disease with crisis, unspecified: Secondary | ICD-10-CM | POA: Diagnosis not present

## 2016-03-24 DIAGNOSIS — R0600 Dyspnea, unspecified: Secondary | ICD-10-CM

## 2016-03-24 DIAGNOSIS — Z79891 Long term (current) use of opiate analgesic: Secondary | ICD-10-CM

## 2016-03-24 DIAGNOSIS — Z7901 Long term (current) use of anticoagulants: Secondary | ICD-10-CM

## 2016-03-24 DIAGNOSIS — E876 Hypokalemia: Secondary | ICD-10-CM | POA: Diagnosis present

## 2016-03-24 DIAGNOSIS — Z91040 Latex allergy status: Secondary | ICD-10-CM

## 2016-03-24 DIAGNOSIS — Z590 Homelessness: Secondary | ICD-10-CM

## 2016-03-24 DIAGNOSIS — Z887 Allergy status to serum and vaccine status: Secondary | ICD-10-CM

## 2016-03-24 DIAGNOSIS — Z885 Allergy status to narcotic agent status: Secondary | ICD-10-CM

## 2016-03-24 DIAGNOSIS — R0609 Other forms of dyspnea: Secondary | ICD-10-CM

## 2016-03-24 DIAGNOSIS — D638 Anemia in other chronic diseases classified elsewhere: Secondary | ICD-10-CM | POA: Diagnosis present

## 2016-03-24 DIAGNOSIS — G40909 Epilepsy, unspecified, not intractable, without status epilepticus: Secondary | ICD-10-CM | POA: Diagnosis present

## 2016-03-24 DIAGNOSIS — J9691 Respiratory failure, unspecified with hypoxia: Secondary | ICD-10-CM | POA: Diagnosis present

## 2016-03-24 DIAGNOSIS — R0902 Hypoxemia: Secondary | ICD-10-CM

## 2016-03-24 DIAGNOSIS — Z888 Allergy status to other drugs, medicaments and biological substances status: Secondary | ICD-10-CM

## 2016-03-24 DIAGNOSIS — R42 Dizziness and giddiness: Secondary | ICD-10-CM

## 2016-03-24 DIAGNOSIS — Z86718 Personal history of other venous thrombosis and embolism: Secondary | ICD-10-CM

## 2016-03-24 DIAGNOSIS — G8929 Other chronic pain: Secondary | ICD-10-CM | POA: Diagnosis present

## 2016-03-24 DIAGNOSIS — K5900 Constipation, unspecified: Secondary | ICD-10-CM | POA: Diagnosis present

## 2016-03-24 DIAGNOSIS — Z79899 Other long term (current) drug therapy: Secondary | ICD-10-CM

## 2016-03-24 LAB — BASIC METABOLIC PANEL
Anion gap: 7 (ref 5–15)
BUN: 8 mg/dL (ref 6–20)
CHLORIDE: 111 mmol/L (ref 101–111)
CO2: 24 mmol/L (ref 22–32)
Calcium: 8.9 mg/dL (ref 8.9–10.3)
Creatinine, Ser: 0.77 mg/dL (ref 0.44–1.00)
GFR calc Af Amer: >60 mL/min (ref >60)
GLUCOSE: 96 mg/dL (ref 65–99)
POTASSIUM: 3.3 mmol/L — AB (ref 3.5–5.1)
Sodium: 142 mmol/L (ref 135–145)

## 2016-03-24 LAB — CBC WITH DIFFERENTIAL/PLATELET
BASOS ABS: 0 10*3/uL (ref 0.0–0.1)
BASOS PCT: 0 %
EOS PCT: 1 %
Eosinophils Absolute: 0.1 10*3/uL (ref 0.0–0.7)
HEMATOCRIT: 24.6 % — AB (ref 36.0–46.0)
Hemoglobin: 8.9 g/dL — ABNORMAL LOW (ref 12.0–15.0)
LYMPHS ABS: 3 10*3/uL (ref 0.7–4.0)
Lymphocytes Relative: 41 %
MCH: 37.1 pg — AB (ref 26.0–34.0)
MCHC: 36.2 g/dL — ABNORMAL HIGH (ref 30.0–36.0)
MCV: 102.5 fL — AB (ref 78.0–100.0)
MONOS PCT: 14 %
Monocytes Absolute: 1 10*3/uL (ref 0.1–1.0)
NEUTROS PCT: 44 %
Neutro Abs: 3.3 10*3/uL (ref 1.7–7.7)
PLATELETS: 503 10*3/uL — AB (ref 150–400)
RBC: 2.4 MIL/uL — ABNORMAL LOW (ref 3.87–5.11)
RDW: 16 % — ABNORMAL HIGH (ref 11.5–15.5)
WBC: 7.4 10*3/uL (ref 4.0–10.5)
nRBC: 3 /100 WBC — ABNORMAL HIGH

## 2016-03-24 LAB — RETICULOCYTES
RBC.: 2.42 MIL/uL — ABNORMAL LOW (ref 3.87–5.11)
RETIC COUNT ABSOLUTE: 198.4 10*3/uL — AB (ref 19.0–186.0)
Retic Ct Pct: 8.2 % — ABNORMAL HIGH (ref 0.4–3.1)

## 2016-03-24 MED ORDER — HYDROMORPHONE HCL 1 MG/ML IJ SOLN
2.0000 mg | Freq: Once | INTRAMUSCULAR | Status: AC
  Administered 2016-03-24: 2 mg via INTRAVENOUS
  Filled 2016-03-24: qty 2

## 2016-03-24 MED ORDER — KETOROLAC TROMETHAMINE 30 MG/ML IJ SOLN
30.0000 mg | INTRAMUSCULAR | Status: AC
  Administered 2016-03-24: 30 mg via INTRAVENOUS
  Filled 2016-03-24: qty 1

## 2016-03-24 MED ORDER — SODIUM CHLORIDE 0.45 % IV SOLN
INTRAVENOUS | Status: DC
  Administered 2016-03-24: 1000 mL via INTRAVENOUS
  Administered 2016-03-25 – 2016-03-27 (×4): via INTRAVENOUS

## 2016-03-24 MED ORDER — DIPHENHYDRAMINE HCL 50 MG/ML IJ SOLN
25.0000 mg | Freq: Once | INTRAMUSCULAR | Status: AC
  Administered 2016-03-24: 25 mg via INTRAVENOUS
  Filled 2016-03-24: qty 1

## 2016-03-24 MED ORDER — HYDROMORPHONE HCL 1 MG/ML IJ SOLN
1.7500 mg | Freq: Once | INTRAMUSCULAR | Status: AC
  Administered 2016-03-24: 1.75 mg via INTRAVENOUS
  Filled 2016-03-24: qty 2

## 2016-03-24 MED ORDER — ONDANSETRON HCL 4 MG/2ML IJ SOLN
4.0000 mg | Freq: Once | INTRAMUSCULAR | Status: AC
  Administered 2016-03-24: 4 mg via INTRAVENOUS
  Filled 2016-03-24: qty 2

## 2016-03-24 NOTE — ED Notes (Signed)
Pt reports her typical sickle cell pain to her legs and back since Friday.  Pt rates pain 10/10.

## 2016-03-24 NOTE — ED Notes (Signed)
Pt still rates pain 8/10,  But laying calmly, texting on phone and watching tv  No distress noted

## 2016-03-24 NOTE — ED Notes (Signed)
C/o bilateral leg pain and left arm pain x 1 week,  [facility] nigh [facility] at [facility],  States ran out of pain med and has no md at present,  States moving to [place], from [place] because they ar not doing anything for here

## 2016-03-24 NOTE — ED Provider Notes (Addendum)
CSN: [id number]     Arrival date & time 03/24/16  1921 History  By signing my name below, I, Helane Gunther, attest that this documentation has been prepared under the direction and in the presence of Tanna Furry, MD. Electronically Signed: Helane Gunther, ED Scribe. 03/24/2016. 8:26 PM.     Chief Complaint  Patient presents with  . Sickle Cell Pain Crisis   The history is provided by the patient. No language interpreter was used.   HPI Comments: Heather Oconnell is a 40 y.o. female with a PMHx of Sickle Cell disease, seizures, and stroke who presents to the Emergency Department complaining of a sickle cell pain crisis onset 4 weeks ago. She reports associated pain in her mid-back and BLE's, as well as diaphoresis. She notes she is supposed to be on methadone and dilaudid, but has not taken anything for a while. She states she was having trouble getting her prescriptions filled when she lived in [place], and since moving to [place] she has also been having trouble with the same. She states she is currently on Xarelto due to chronic blood clots. She also reports a PMHx of rib fracture, which is still causing her pain. Pt denies fever and chills.  Pt is allergic to demerol, fentanyl, influenza vaccines, oxytocin, and latex.   Past Medical History  Diagnosis Date  . Sickle cell disease ([place])   . Seizures ([place])   . Stroke ([place])   . Blood clot due to device, implant, or graft    History reviewed. No pertinent past surgical history. History reviewed. No pertinent family history. Social History  Substance Use Topics  . Smoking status: Never Smoker   . Smokeless tobacco: Never Used  . Alcohol Use: No   OB History    No data available     Review of Systems  Constitutional: Positive for diaphoresis. Negative for fever, chills, appetite change and fatigue.  HENT: Negative for mouth sores, sore throat and trouble swallowing.   Eyes: Negative for visual disturbance.  Respiratory:  Negative for cough, chest tightness, shortness of breath and wheezing.   Cardiovascular: Negative for chest pain.  Gastrointestinal: Negative for nausea, vomiting, abdominal pain, diarrhea and abdominal distention.  Endocrine: Negative for polydipsia, polyphagia and polyuria.  Genitourinary: Negative for dysuria, frequency and hematuria.  Musculoskeletal: Positive for myalgias and back pain. Negative for gait problem.  Skin: Negative for color change, pallor and rash.  Neurological: Negative for dizziness, syncope, light-headedness and headaches.  Hematological: Does not bruise/bleed easily.  Psychiatric/Behavioral: Negative for behavioral problems and confusion.    Allergies  Demerol; Fentanyl; Influenza vaccines; Oxycontin; and Latex  Home Medications   Prior to Admission medications   Medication Sig Start Date End Date Taking? Authorizing Provider  cetirizine (ZYRTEC) 10 MG tablet Take 10 mg by mouth daily.    Historical Provider, MD  folic acid (FOLVITE) 1 MG tablet Take 1 mg by mouth daily.    Historical Provider, MD  HYDROmorphone (DILAUDID) 4 MG tablet Take 1 tablet (4 mg total) by mouth every 4 (four) hours as needed for severe pain. 02/15/16   Leana Gamer, MD  hydroxyurea (HYDREA) 500 MG capsule Take 1,000 mg by mouth daily. May take with food to minimize GI side effects.    Historical Provider, MD  levETIRAcetam (KEPPRA) 500 MG tablet Take 1,000 mg by mouth every 12 (twelve) hours.    Historical Provider, MD  rivaroxaban (XARELTO) 20 MG TABS tablet Take 20 mg by mouth daily.  Historical Provider, MD   BP 105/69 mmHg  Pulse 86  Temp(Src) 98.8 F (37.1 C) (Oral)  Resp 18  Ht 5\' 8"  (1.727 m)  Wt 149 lb 8 oz (67.813 kg)  BMI 22.74 kg/m2  SpO2 95%  LMP 03/19/2016 (Exact Date) Physical Exam  Constitutional: She is oriented to person, place, and time. She appears well-developed and well-nourished. No distress.  HENT:  Head: Normocephalic.  Eyes: Conjunctivae are  normal. Pupils are equal, round, and reactive to light. No scleral icterus.  Neck: Normal range of motion. Neck supple. No thyromegaly present.  Cardiovascular: Normal rate and regular rhythm.  Exam reveals no gallop and no friction rub.   No murmur heard. Pulmonary/Chest: Effort normal and breath sounds normal. No respiratory distress. She has no wheezes. She has no rales.  Abdominal: Soft. Bowel sounds are normal. She exhibits no distension. There is no tenderness. There is no rebound.  Musculoskeletal: Normal range of motion. She exhibits edema.  Right LE edema, 2 cm greater than the left  Neurological: She is alert and oriented to person, place, and time.  Skin: Skin is warm and dry. No rash noted.  Psychiatric: She has a normal mood and affect. Her behavior is normal.    ED Course  Procedures  DIAGNOSTIC STUDIES: Oxygen Saturation is 96% on RA, adequate by my interpretation.    COORDINATION OF CARE: 8:18 PM - Discussed plans to order medication for pain. Pt advised of plan for treatment and pt agrees.  Labs Review Labs Reviewed  CBC WITH DIFFERENTIAL/PLATELET - Abnormal; Notable for the following:    RBC 2.40 (*)    Hemoglobin 8.9 (*)    HCT 24.6 (*)    MCV 102.5 (*)    MCH 37.1 (*)    MCHC 36.2 (*)    RDW 16.0 (*)    Platelets 503 (*)    nRBC 3 (*)    All other components within normal limits  BASIC METABOLIC PANEL - Abnormal; Notable for the following:    Potassium 3.3 (*)    All other components within normal limits  RETICULOCYTES - Abnormal; Notable for the following:    Retic Ct Pct 8.2 (*)    RBC. 2.42 (*)    Retic Count, Manual 198.4 (*)    All other components within normal limits  CBC WITH DIFFERENTIAL/PLATELET    Imaging Review No results found. I have personally reviewed and evaluated these images and lab results as part of my medical decision-making.   EKG Interpretation None      MDM   Final diagnoses:  Vasoocclusive sickle cell crisis [facility])     Patient is apparently relatively new to town. Doesn't have a local primary care physician, hematologist, her pain management physician. As admitted once to [facility]. Seen and evaluated and treated 4 days ago at [facility] able to be discharged. Tonight seems to be getting no relief with similar doses.  Patient placed on the sickle cell order set. Given 1.75 grams Dilaudid. Second dose 2.0 mg Dilaudid. There does order to 2 mg Dilaudid is pending. She claims 0 to minimal relief after doses 1 and 2. Labs show no acute or concerning findings.  I will be third dose. Reassessment. Discussed with her that after third dose we will make a decision of disposition.   I personally performed the services described in this documentation, which was scribed in my presence. The recorded information has been reviewed and is accurate.  Tanna Furry, MD 03/24/16 2325  Tanna Furry, MD 03/24/16 [id number]  Tanna Furry, MD 03/24/16 [phone]

## 2016-03-25 ENCOUNTER — Emergency Department (HOSPITAL_COMMUNITY): Payer: Medicare Other

## 2016-03-25 DIAGNOSIS — D649 Anemia, unspecified: Secondary | ICD-10-CM | POA: Diagnosis not present

## 2016-03-25 DIAGNOSIS — G40909 Epilepsy, unspecified, not intractable, without status epilepticus: Secondary | ICD-10-CM | POA: Diagnosis not present

## 2016-03-25 DIAGNOSIS — K5900 Constipation, unspecified: Secondary | ICD-10-CM | POA: Diagnosis present

## 2016-03-25 DIAGNOSIS — Z887 Allergy status to serum and vaccine status: Secondary | ICD-10-CM | POA: Diagnosis not present

## 2016-03-25 DIAGNOSIS — Z885 Allergy status to narcotic agent status: Secondary | ICD-10-CM | POA: Diagnosis not present

## 2016-03-25 DIAGNOSIS — D5700 Hb-SS disease with crisis, unspecified: Secondary | ICD-10-CM | POA: Diagnosis present

## 2016-03-25 DIAGNOSIS — Z91040 Latex allergy status: Secondary | ICD-10-CM | POA: Diagnosis not present

## 2016-03-25 DIAGNOSIS — Z888 Allergy status to other drugs, medicaments and biological substances status: Secondary | ICD-10-CM | POA: Diagnosis not present

## 2016-03-25 DIAGNOSIS — G8929 Other chronic pain: Secondary | ICD-10-CM | POA: Diagnosis not present

## 2016-03-25 DIAGNOSIS — R0902 Hypoxemia: Secondary | ICD-10-CM | POA: Diagnosis not present

## 2016-03-25 DIAGNOSIS — R0602 Shortness of breath: Secondary | ICD-10-CM | POA: Diagnosis not present

## 2016-03-25 DIAGNOSIS — D638 Anemia in other chronic diseases classified elsewhere: Secondary | ICD-10-CM | POA: Diagnosis not present

## 2016-03-25 DIAGNOSIS — R42 Dizziness and giddiness: Secondary | ICD-10-CM | POA: Diagnosis not present

## 2016-03-25 DIAGNOSIS — Z79899 Other long term (current) drug therapy: Secondary | ICD-10-CM | POA: Diagnosis not present

## 2016-03-25 DIAGNOSIS — Z590 Homelessness: Secondary | ICD-10-CM | POA: Diagnosis not present

## 2016-03-25 DIAGNOSIS — R0609 Other forms of dyspnea: Secondary | ICD-10-CM | POA: Diagnosis not present

## 2016-03-25 DIAGNOSIS — D571 Sickle-cell disease without crisis: Secondary | ICD-10-CM | POA: Diagnosis not present

## 2016-03-25 DIAGNOSIS — M549 Dorsalgia, unspecified: Secondary | ICD-10-CM | POA: Diagnosis not present

## 2016-03-25 DIAGNOSIS — J9691 Respiratory failure, unspecified with hypoxia: Secondary | ICD-10-CM | POA: Diagnosis present

## 2016-03-25 DIAGNOSIS — Z7901 Long term (current) use of anticoagulants: Secondary | ICD-10-CM

## 2016-03-25 DIAGNOSIS — Z86718 Personal history of other venous thrombosis and embolism: Secondary | ICD-10-CM | POA: Diagnosis not present

## 2016-03-25 DIAGNOSIS — Z79891 Long term (current) use of opiate analgesic: Secondary | ICD-10-CM | POA: Diagnosis not present

## 2016-03-25 DIAGNOSIS — E876 Hypokalemia: Secondary | ICD-10-CM | POA: Diagnosis not present

## 2016-03-25 DIAGNOSIS — R0600 Dyspnea, unspecified: Secondary | ICD-10-CM | POA: Diagnosis not present

## 2016-03-25 MED ORDER — METHADONE HCL 10 MG PO TABS
10.0000 mg | ORAL_TABLET | Freq: Two times a day (BID) | ORAL | Status: DC
  Administered 2016-03-25 – 2016-03-30 (×11): 10 mg via ORAL
  Filled 2016-03-25 (×11): qty 1

## 2016-03-25 MED ORDER — SODIUM CHLORIDE 0.9 % IV SOLN
12.5000 mg | Freq: Four times a day (QID) | INTRAVENOUS | Status: DC | PRN
  Administered 2016-03-27 (×2): 12.5 mg via INTRAVENOUS
  Filled 2016-03-25 (×4): qty 0.25

## 2016-03-25 MED ORDER — LEVETIRACETAM 500 MG PO TABS
1000.0000 mg | ORAL_TABLET | Freq: Two times a day (BID) | ORAL | Status: DC
  Administered 2016-03-25 – 2016-03-30 (×11): 1000 mg via ORAL
  Filled 2016-03-25 (×13): qty 2

## 2016-03-25 MED ORDER — FOLIC ACID 1 MG PO TABS
1.0000 mg | ORAL_TABLET | Freq: Every day | ORAL | Status: DC
  Administered 2016-03-25 – 2016-03-30 (×6): 1 mg via ORAL
  Filled 2016-03-25 (×6): qty 1

## 2016-03-25 MED ORDER — POLYETHYLENE GLYCOL 3350 17 G PO PACK
17.0000 g | PACK | Freq: Every day | ORAL | Status: DC | PRN
  Filled 2016-03-25: qty 1

## 2016-03-25 MED ORDER — HYDROMORPHONE 1 MG/ML IV SOLN
INTRAVENOUS | Status: DC
  Administered 2016-03-25: 2.5 mg via INTRAVENOUS
  Administered 2016-03-25: 07:00:00 via INTRAVENOUS
  Administered 2016-03-25: 2 mg via INTRAVENOUS
  Administered 2016-03-25 (×2): 1.5 mg via INTRAVENOUS
  Administered 2016-03-26: 0 mg via INTRAVENOUS
  Administered 2016-03-26: 2.5 mg via INTRAVENOUS
  Administered 2016-03-26: 0.5 mg via INTRAVENOUS
  Administered 2016-03-26: 1.5 mg via INTRAVENOUS
  Administered 2016-03-26: 2 mg via INTRAVENOUS
  Administered 2016-03-26: 3 mg via INTRAVENOUS
  Administered 2016-03-27: 1 mg via INTRAVENOUS
  Administered 2016-03-27: 0.5 mg via INTRAVENOUS
  Administered 2016-03-27: 1.5 mg via INTRAVENOUS
  Filled 2016-03-25: qty 25

## 2016-03-25 MED ORDER — NALOXONE HCL 0.4 MG/ML IJ SOLN: 0.4000 mg | INTRAMUSCULAR | Status: DC | PRN

## 2016-03-25 MED ORDER — SODIUM CHLORIDE 0.9% FLUSH
9.0000 mL | INTRAVENOUS | Status: DC | PRN
Start: 2016-03-25 — End: 2016-03-27

## 2016-03-25 MED ORDER — SENNOSIDES-DOCUSATE SODIUM 8.6-50 MG PO TABS
1.0000 | ORAL_TABLET | Freq: Two times a day (BID) | ORAL | Status: DC
  Administered 2016-03-25 – 2016-03-30 (×11): 1 via ORAL
  Filled 2016-03-25 (×11): qty 1

## 2016-03-25 MED ORDER — HYDROXYUREA 500 MG PO CAPS
1000.0000 mg | ORAL_CAPSULE | Freq: Every day | ORAL | Status: DC
  Administered 2016-03-25 – 2016-03-30 (×6): 1000 mg via ORAL
  Filled 2016-03-25 (×7): qty 2

## 2016-03-25 MED ORDER — KETOROLAC TROMETHAMINE 30 MG/ML IJ SOLN
30.0000 mg | Freq: Four times a day (QID) | INTRAMUSCULAR | Status: AC
  Administered 2016-03-25 – 2016-03-29 (×16): 30 mg via INTRAVENOUS
  Filled 2016-03-25 (×16): qty 1

## 2016-03-25 MED ORDER — ONDANSETRON HCL 4 MG/2ML IJ SOLN
4.0000 mg | Freq: Four times a day (QID) | INTRAMUSCULAR | Status: DC | PRN
  Administered 2016-03-25 – 2016-03-27 (×4): 4 mg via INTRAVENOUS
  Filled 2016-03-25 (×4): qty 2

## 2016-03-25 MED ORDER — RIVAROXABAN 20 MG PO TABS
20.0000 mg | ORAL_TABLET | Freq: Every day | ORAL | Status: DC
  Administered 2016-03-25: 20 mg via ORAL
  Filled 2016-03-25 (×4): qty 1

## 2016-03-25 MED ORDER — DIPHENHYDRAMINE HCL 12.5 MG/5ML PO ELIX
12.5000 mg | ORAL_SOLUTION | Freq: Four times a day (QID) | ORAL | Status: DC | PRN
  Administered 2016-03-25 (×2): 12.5 mg via ORAL
  Filled 2016-03-25 (×2): qty 5

## 2016-03-25 MED ORDER — LORATADINE 10 MG PO TABS
10.0000 mg | ORAL_TABLET | Freq: Every day | ORAL | Status: DC
  Administered 2016-03-25 – 2016-03-30 (×6): 10 mg via ORAL
  Filled 2016-03-25 (×6): qty 1

## 2016-03-25 NOTE — ED Provider Notes (Signed)
40 year old female with a history of sickle cell disease has been having pain typical of her crisis for the last week. She went to [facility] where she received some arthrosis milligrams of hydromorphone without bringing her pain under control. There was some concern because her oxygen saturations were dropping when she fell asleep. On exam now, she does appear slightly uncomfortable. Oxygen saturation is noted to be 91-92% while she is awake and talking to me. Lungs are clear and heart has regular rate and rhythm. She will need to be admitted for control of her pain from her sickle cell crisis and will need to have oxygen saturations monitored closely. Cases been discussed with Dr. Alcario Drought of [facility] who has been in to see the patient to get her admitted.  Delora Fuel, MD [phone]

## 2016-03-25 NOTE — ED Provider Notes (Signed)
I received this patient in signout from Dr. Jeneen Rinks, who had evaluated her this evening for sickle cell pain crisis. Per his report, the patient has received 3 doses of Dilaudid totaling 6 mg as well as Toradol, maintenance IV fluids. On reexamination after the third dose of Dilaudid, the patient still rates her pain 8/10 in intensity and reports no significant improvement since arrival. Per Dr. Drue Stager plan, I contacted [facility] to discuss admission for pain control. Discussed w/ Dr. Alcario Drought, who was concerned about giving patient ongoing narcotics given that she has desaturated during sleep while here. Will transfer to [facility] ED to allow Dr. Alcario Drought to evaluate patient and determine appropriate disposition. Discussed ED transfer with Dr. Roxanne Mins, appreciate his assistance. Pt will be transferred to [facility] ED.  Sharlett Iles, MD 03/25/16 [phone]

## 2016-03-25 NOTE — ED Notes (Signed)
Bed: [id number] Expected date:  Expected time:  Means of arrival:  Comments: Transfer from med center

## 2016-03-25 NOTE — H&P (Signed)
History and Physical    Heather Oconnell DOB: 02/04/1976 DOA: 03/24/2016  Referring MD/NP/PA: Dr. Rex Kras PCP: No PCP Per Patient Outpatient Specialists: None Patient coming from: [facility]  Chief Complaint: Sickle cell pain crisis  HPI: Heather Oconnell is a 40 y.o. female with medical history significant of HGB SS disease, seizure disorder, blood clot in past now on chronic Xarelto.  Patient presents to the ED with c/o ongoing sickle cell pain crisis.  Symptoms onset 4 weeks ago.  Pain located in mid back and BLE and typical of pain crisis.  She is supposed to be on methadone and dilaudid but has ran out of meds.  No fever no chills, no SOB, no chest pain, no cough.  ED Course: Patient given 6mg  dilaudid at [facility], was reportedly still having pain, but was also reportedly desaturating while asleep.  Had patient transferred to [facility] ED for evaluation prior to admission.  Patient is currently in room and sating 91% while talking with me.  Review of Systems: As per HPI otherwise 10 point review of systems negative.    Past Medical History  Diagnosis Date  . Sickle cell disease ([place])   . Seizures ([place])   . Stroke ([place])   . Blood clot due to device, implant, or graft     History reviewed. No pertinent past surgical history.   reports that she has never smoked. She has never used smokeless tobacco. She reports that she does not drink alcohol or use illicit drugs.  Allergies  Allergen Reactions  . Demerol [Meperidine] Other (See Comments)    Causes seizures  . Fentanyl Other (See Comments)    Stoke symptoms with Duragesic patch  . Influenza Vaccines Other (See Comments)    unknown  . Oxycontin [Oxycodone Hcl] Other (See Comments)    unknown  . Latex Rash    History reviewed. No pertinent family history.   Prior to Admission medications   Medication Sig Start Date End Date Taking? Authorizing Provider  cetirizine (ZYRTEC) 10 MG tablet Take 10 mg by mouth daily.     Historical Provider, MD  folic acid (FOLVITE) 1 MG tablet Take 1 mg by mouth daily.    Historical Provider, MD  HYDROmorphone (DILAUDID) 4 MG tablet Take 1 tablet (4 mg total) by mouth every 4 (four) hours as needed for severe pain. 02/15/16   Leana Gamer, MD  hydroxyurea (HYDREA) 500 MG capsule Take 1,000 mg by mouth daily. May take with food to minimize GI side effects.    Historical Provider, MD  levETIRAcetam (KEPPRA) 500 MG tablet Take 1,000 mg by mouth every 12 (twelve) hours.    Historical Provider, MD  rivaroxaban (XARELTO) 20 MG TABS tablet Take 20 mg by mouth daily.    Historical Provider, MD    Physical Exam: Filed Vitals:   03/25/16 0230 03/25/16 0300 03/25/16 0336 03/25/16 0445  BP: 100/73 106/70 126/85 109/87  Pulse: 81 83 99 58  Temp:    97.7 F (36.5 C)  TempSrc:    Oral  Resp: 13 13 19 20   Height:      Weight:      SpO2: 89% 93% 100% 94%      Constitutional: NAD, calm, comfortable Filed Vitals:   03/25/16 0230 03/25/16 0300 03/25/16 0336 03/25/16 0445  BP: 100/73 106/70 126/85 109/87  Pulse: 81 83 99 58  Temp:    97.7 F (36.5 C)  TempSrc:    Oral  Resp: 13 13 19 20   Height:  Weight:      SpO2: 89% 93% 100% 94%   Eyes: PERRL, lids and conjunctivae normal ENMT: Mucous membranes are moist. Posterior pharynx clear of any exudate or lesions.Normal dentition.  Neck: normal, supple, no masses, no thyromegaly Respiratory: clear to auscultation bilaterally, no wheezing, no crackles. Normal respiratory effort. No accessory muscle use.  Cardiovascular: Regular rate and rhythm, no murmurs / rubs / gallops. No extremity edema. 2+ pedal pulses. No carotid bruits.  Abdomen: no tenderness, no masses palpated. No hepatosplenomegaly. Bowel sounds positive.  Musculoskeletal: no clubbing / cyanosis. No joint deformity upper and lower extremities. Good ROM, no contractures. Normal muscle tone.  Skin: no rashes, lesions, ulcers. No induration Neurologic: CN 2-12  grossly intact. Sensation intact, DTR normal. Strength 5/5 in all 4.  Psychiatric: Normal judgment and insight. Alert and oriented x 3. Normal mood.    Labs on Admission: I have personally reviewed following labs and imaging studies  CBC:  Recent Labs Lab 03/20/16 0620 03/24/16 2125  WBC 7.4 7.4  NEUTROABS 4.0 3.3  HGB 9.5* 8.9*  HCT 26.4* 24.6*  MCV 102.7* 102.5*  PLT 449* [id number]*   Basic Metabolic Panel:  Recent Labs Lab 03/20/16 0620 03/24/16 2125  NA 141 142  K 3.6 3.3*  CL 108 111  CO2 20* 24  GLUCOSE 102* 96  BUN 6 8  CREATININE 0.78 0.77  CALCIUM 9.3 8.9   GFR: Estimated Creatinine Clearance: 92.4 mL/min (by C-G formula based on Cr of 0.77). Liver Function Tests:  Recent Labs Lab 03/20/16 0620  AST 20  ALT 12*  ALKPHOS 107  BILITOT 1.3*  PROT 8.1  ALBUMIN 4.2   No results for input(s): LIPASE, AMYLASE in the last 168 hours. No results for input(s): AMMONIA in the last 168 hours. Coagulation Profile: No results for input(s): INR, PROTIME in the last 168 hours. Cardiac Enzymes:  Recent Labs Lab 03/20/16 0620  TROPONINI <0.03   BNP (last 3 results) No results for input(s): PROBNP in the last 8760 hours. HbA1C: No results for input(s): HGBA1C in the last 72 hours. CBG: No results for input(s): GLUCAP in the last 168 hours. Lipid Profile: No results for input(s): CHOL, HDL, LDLCALC, TRIG, CHOLHDL, LDLDIRECT in the last 72 hours. Thyroid Function Tests: No results for input(s): TSH, T4TOTAL, FREET4, T3FREE, THYROIDAB in the last 72 hours. Anemia Panel:  Recent Labs  03/24/16 2125  RETICCTPCT 8.2*   Urine analysis: No results found for: COLORURINE, APPEARANCEUR, LABSPEC, PHURINE, GLUCOSEU, HGBUR, BILIRUBINUR, KETONESUR, PROTEINUR, UROBILINOGEN, NITRITE, LEUKOCYTESUR Sepsis Labs: @LABRCNTIP (procalcitonin:4,lacticidven:4) )No results found for this or any previous visit (from the past 240 hour(s)).   Radiological Exams on Admission: No  results found.  EKG: Independently reviewed.  Assessment/Plan Principal Problem:   Sickle cell pain crisis (HCC) Active Problems:   Chronic anticoagulation  Sickle cell pain crisis -  Dilaudid PCA  O2 via [place]  No symptoms, but will get CXR of patient given the borderline hypoxia.  No toradol ordered due to high risk of GIB with concurrent use of Xarelto Chronic anticoagulation - continue Xarelto   DVT prophylaxis: Xarelto Code Status: Full Family Communication: No family in room Consults called: None Admission status: Admit to obs   [place], [place] [facility] Pager [phone] from 7PM-7AM  If 7AM-7PM, please contact the day physician for the patient [email or web address] Password [facility]  03/25/2016, 5:08 AM

## 2016-03-26 DIAGNOSIS — D5700 Hb-SS disease with crisis, unspecified: Principal | ICD-10-CM

## 2016-03-26 LAB — COMPREHENSIVE METABOLIC PANEL
ALBUMIN: 4.1 g/dL (ref 3.5–5.0)
ALK PHOS: 96 U/L (ref 38–126)
ALT: 17 U/L (ref 14–54)
ANION GAP: 8 (ref 5–15)
AST: 22 U/L (ref 15–41)
BUN: 8 mg/dL (ref 6–20)
CALCIUM: 9 mg/dL (ref 8.9–10.3)
CO2: 27 mmol/L (ref 22–32)
Chloride: 105 mmol/L (ref 101–111)
Creatinine, Ser: 0.8 mg/dL (ref 0.44–1.00)
GFR calc non Af Amer: >60 mL/min (ref >60)
GLUCOSE: 110 mg/dL — AB (ref 65–99)
POTASSIUM: 4.1 mmol/L (ref 3.5–5.1)
SODIUM: 140 mmol/L (ref 135–145)
TOTAL PROTEIN: 7.4 g/dL (ref 6.5–8.1)
Total Bilirubin: 2 mg/dL — ABNORMAL HIGH (ref 0.3–1.2)

## 2016-03-26 LAB — CBC WITH DIFFERENTIAL/PLATELET
BASOS PCT: 0 %
Basophils Absolute: 0 10*3/uL (ref 0.0–0.1)
EOS ABS: 0.2 10*3/uL (ref 0.0–0.7)
EOS PCT: 3 %
HCT: 21.8 % — ABNORMAL LOW (ref 36.0–46.0)
Hemoglobin: 8.1 g/dL — ABNORMAL LOW (ref 12.0–15.0)
LYMPHS ABS: 3.8 10*3/uL (ref 0.7–4.0)
Lymphocytes Relative: 44 %
MCH: 36.7 pg — AB (ref 26.0–34.0)
MCHC: 36.5 g/dL — AB (ref 30.0–36.0)
MCV: 99.5 fL (ref 78.0–100.0)
MONO ABS: 1.1 10*3/uL — AB (ref 0.1–1.0)
MONOS PCT: 13 %
Neutro Abs: 3.6 10*3/uL (ref 1.7–7.7)
Neutrophils Relative %: 41 %
Platelets: 461 10*3/uL — ABNORMAL HIGH (ref 150–400)
RBC: 2.19 MIL/uL — ABNORMAL LOW (ref 3.87–5.11)
RDW: 19.5 % — AB (ref 11.5–15.5)
WBC: 8.8 10*3/uL (ref 4.0–10.5)

## 2016-03-26 MED ORDER — RIVAROXABAN 20 MG PO TABS: 20.0000 mg | ORAL_TABLET | Freq: Every day | ORAL | Status: DC

## 2016-03-26 MED ORDER — RIVAROXABAN 20 MG PO TABS
20.0000 mg | ORAL_TABLET | Freq: Every day | ORAL | Status: DC
  Administered 2016-03-26 – 2016-03-29 (×4): 20 mg via ORAL
  Filled 2016-03-26 (×4): qty 1

## 2016-03-26 NOTE — Progress Notes (Signed)
Subjective:   A 40 year old female with history of sickle cell disease on chronic and to call the lesion also due to history of DVT admitted yesterday with sickle cell painful crisis.  Patient is on Dilaudid PCA Toradol as well as IV fluids.  Pain today is a 6 out of 10. She has use 10 mg of the Dilaudid with 21 demands and 21 and delivery so far.   No nausea vomiting no diarrhea.  No significant shortness of breath or cough.  Objective: Vital signs in last 24 hours: Temp:  [97.4 F (36.3 C)-98.2 F (36.8 C)] 97.5 F (36.4 C) (04/23 0657) Pulse Rate:  [62-105] 62 (04/23 0657) Resp:  [11-28] 13 (04/23 0730) BP: (97-115)/(47-74) 101/74 mmHg (04/23 0657) SpO2:  [91 %-100 %] 98 % (04/23 0730) Weight change: 0.039 kg (1.4 oz) Last BM Date: 03/25/16  Intake/Output from previous day: 04/22 0701 - 04/23 0700 In: 2298.9 [P.O.:600; I.V.:1698.9] Out: -  Intake/Output this shift:    General appearance: alert, cooperative, appears stated age and no distress Back: symmetric, no curvature. ROM normal. No CVA tenderness. Resp: clear to auscultation bilaterally Chest wall: no tenderness Cardio: regular rate and rhythm, S1, S2 normal, no murmur, click, rub or gallop GI: soft, non-tender; bowel sounds normal; no masses,  no organomegaly Extremities: extremities normal, atraumatic, no cyanosis or edema Pulses: 2+ and symmetric Skin: Skin color, texture, turgor normal. No rashes or lesions Neurologic: Grossly normal  Lab Results:  Recent Labs  03/24/16 2125 03/26/16 0357  WBC 7.4 8.8  HGB 8.9* 8.1*  HCT 24.6* 21.8*  PLT 503* 461*   BMET  Recent Labs  03/24/16 2125 03/26/16 0357  NA 142 140  K 3.3* 4.1  CL 111 105  CO2 24 27  GLUCOSE 96 110*  BUN 8 8  CREATININE 0.77 0.80  CALCIUM 8.9 9.0    Studies/Results: Dg Chest 2 View  03/25/2016  CLINICAL DATA:  Hypoxia.  History of sickle cell disease. EXAM: CHEST  2 VIEW COMPARISON:  03/20/2016 FINDINGS: Normal heart size and  pulmonary vascularity. No focal airspace disease or consolidation in the lungs. No blunting of costophrenic angles. No pneumothorax. Mediastinal contours appear intact. Sclerotic bone changes in the spine and right humeral head consistent with bony changes of sickle cell. IMPRESSION: No evidence of active pulmonary disease.  Sickle cell bony changes. Electronically Signed   By: Lucienne Capers M.D.   On: 03/25/2016 06:09    Medications: I have reviewed the patient's current medications.  Assessment/Plan:   A 40 year old female admitted with sickle cell painful crisis.    1. Sickle cell painful crisis:  Patient seems to be doing fine on current regimen.  I would keep her on Dilaudid PCA with the Toradol as well as IV fluids.  She has chronic pain and is opiate tolerant on methadone.  We have continued with the methadone.  If she continues to do better we will transition her to oral short-acting tomorrow. Continue IV hydration.  2.  Sickle cell anemia:  Hemoglobin appears to be stable.  Continue to monitor her H&H.  3.  Hypokalemia:  This is repleted.  4.   Chronic anticoagulation:  Patient on Xarelto.  Continue p.o.  LOS: 1 day   Javell Blackburn,LAWAL 03/26/2016, 9:33 AM

## 2016-03-27 DIAGNOSIS — Z7901 Long term (current) use of anticoagulants: Secondary | ICD-10-CM

## 2016-03-27 DIAGNOSIS — G40909 Epilepsy, unspecified, not intractable, without status epilepticus: Secondary | ICD-10-CM

## 2016-03-27 LAB — COMPREHENSIVE METABOLIC PANEL
ALK PHOS: 95 U/L (ref 38–126)
ALT: 16 U/L (ref 14–54)
AST: 21 U/L (ref 15–41)
Albumin: 4 g/dL (ref 3.5–5.0)
Anion gap: 6 (ref 5–15)
BUN: 7 mg/dL (ref 6–20)
CALCIUM: 8.9 mg/dL (ref 8.9–10.3)
CO2: 26 mmol/L (ref 22–32)
CREATININE: 0.67 mg/dL (ref 0.44–1.00)
Chloride: 105 mmol/L (ref 101–111)
Glucose, Bld: 100 mg/dL — ABNORMAL HIGH (ref 65–99)
Potassium: 4 mmol/L (ref 3.5–5.1)
Sodium: 137 mmol/L (ref 135–145)
TOTAL PROTEIN: 7.4 g/dL (ref 6.5–8.1)
Total Bilirubin: 1.3 mg/dL — ABNORMAL HIGH (ref 0.3–1.2)

## 2016-03-27 LAB — CBC WITH DIFFERENTIAL/PLATELET
Basophils Absolute: 0 10*3/uL (ref 0.0–0.1)
Basophils Relative: 0 %
EOS ABS: 0.4 10*3/uL (ref 0.0–0.7)
Eosinophils Relative: 6 %
HCT: 21 % — ABNORMAL LOW (ref 36.0–46.0)
HEMOGLOBIN: 7.6 g/dL — AB (ref 12.0–15.0)
LYMPHS PCT: 49 %
Lymphs Abs: 3.3 10*3/uL (ref 0.7–4.0)
MCH: 36.7 pg — AB (ref 26.0–34.0)
MCHC: 36.2 g/dL — ABNORMAL HIGH (ref 30.0–36.0)
MCV: 101.4 fL — AB (ref 78.0–100.0)
MONO ABS: 0.9 10*3/uL (ref 0.1–1.0)
MONOS PCT: 14 %
NEUTROS ABS: 2.1 10*3/uL (ref 1.7–7.7)
NRBC: 3 /100{WBCs} — AB
Neutrophils Relative %: 31 %
Platelets: 400 10*3/uL (ref 150–400)
RBC: 2.07 MIL/uL — AB (ref 3.87–5.11)
RDW: 19.4 % — ABNORMAL HIGH (ref 11.5–15.5)
WBC: 6.7 10*3/uL (ref 4.0–10.5)

## 2016-03-27 LAB — RETICULOCYTES
RBC.: 2.1 MIL/uL — AB (ref 3.87–5.11)
RETIC CT PCT: 10.5 % — AB (ref 0.4–3.1)
Retic Count, Absolute: 220.5 10*3/uL — ABNORMAL HIGH (ref 19.0–186.0)

## 2016-03-27 MED ORDER — HYDROMORPHONE HCL 2 MG/ML IJ SOLN
2.0000 mg | INTRAMUSCULAR | Status: DC | PRN
  Administered 2016-03-28 – 2016-03-29 (×2): 2 mg via INTRAVENOUS
  Filled 2016-03-27 (×2): qty 1

## 2016-03-27 MED ORDER — HYDROMORPHONE HCL 4 MG PO TABS
4.0000 mg | ORAL_TABLET | ORAL | Status: DC
  Administered 2016-03-27 – 2016-03-30 (×18): 4 mg via ORAL
  Filled 2016-03-27 (×18): qty 1

## 2016-03-27 NOTE — Progress Notes (Signed)
[facility] PROGRESS NOTE  Cayse Willock [id number] DOB: 12-01-1976 DOA: 03/24/2016 PCP: Bennye Alm, MD  Assessment/Plan: Principal Problem:   Sickle cell pain crisis ([place]) Active Problems:   Chronic anticoagulation   Sickle cell crisis ([place])  1. Hb SS with crisis: Pt reports that pain is significantly improved today. Down to 6/10 and localized to back today. Will schedule oral Dilaudid and continue PCA for PRN in anticipation of dischargre tomorrow. Continue Toradol. Will re-assess for possible discharge tomorrow. 2. Anemia: Hb at baseline. Indices favorable so will continue Hydrea.  3. 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  She is chronically on Xarelto.  4. Seizure Disorder: Has not had a seizure since 2005. Continue Keppra. 5. Proptosis: TSH found to be within normal limits within the last 2 months . This is likely a variant of normal on this patient. 6. Chronic Homelessness: Working with [employer] to relocate to [place], however still following with her Physician in [facility] for now. 7. Constipation: Last bowel movement yesterday.  Code Status: Full Code Family Communication: N/A Disposition Plan: Not yet ready for discharge  [place].  Pager [phone]. If 7PM-7AM, please contact night-coverage.  03/27/2016, 9:33 AM  LOS: 2 days   Interim History: Pt reports that pain is significantly improved today. Down to 6/10 and localized to back today.  Consultants:  None  Procedures:  None  Antibiotics:  None   Objective: Filed Vitals:   03/27/16 0452 03/27/16 0558 03/27/16 0600 03/27/16 0813  BP: 89/37 89/54 90/54    Pulse: 69     Temp: 98.1 F (36.7 C)     TempSrc: Oral     Resp: 10   8  Height:      Weight:      SpO2: 100%   100%   Weight change:   Intake/Output Summary (Last 24 hours) at 03/27/16 0933 Last data filed at 03/27/16 0600  Gross per 24 hour  Intake 1860.4  ml  Output      0 ml  Net 1860.4 ml    General: Alert, awake, oriented x3, in no apparent distress.  HEENT: [place]/AT PEERL, EOMI. Anicteric. Proptosis and mild icterus present. Neck: Trachea midline,  no masses, no thyromegal,y no JVD, no carotid bruit OROPHARYNX:  Moist, No exudate/ erythema/lesions.  Heart: Regular rate and rhythm, without murmurs, rubs, gallops, PMI non-displaced, no heaves or thrills on palpation.  Lungs: Clear to auscultation, no wheezing or rhonchi noted. No increased vocal fremitus resonant to percussion. Veins prominent on chest wall. Abdomen: Soft, nontender, nondistended, positive bowel sounds, no masses no hepatosplenomegaly noted..  Neuro: No focal neurological deficits noted cranial nerves II through XII grossly intact. Strength at functional baseline in bilateral upper and lower extremities. Musculoskeletal: No warmth swelling or erythema around joints, no spinal tenderness noted. Psychiatric: Patient alert and oriented x3, good insight and cognition, good recent to remote recall.    Data Reviewed: Basic Metabolic Panel:  Recent Labs Lab 03/24/16 2125 03/26/16 0357 03/27/16 0410  NA 142 140 137  K 3.3* 4.1 4.0  CL 111 105 105  CO2 24 27 26   GLUCOSE 96 110* 100*  BUN 8 8 7   CREATININE 0.77 0.80 0.67  CALCIUM 8.9 9.0 8.9   Liver Function Tests:  Recent Labs Lab 03/26/16 0357 03/27/16 0410  AST 22 21  ALT 17 16  ALKPHOS 96 95  BILITOT 2.0* 1.3*  PROT 7.4 7.4  ALBUMIN 4.1 4.0  No results for input(s): LIPASE, AMYLASE in the last 168 hours. No results for input(s): AMMONIA in the last 168 hours. CBC:  Recent Labs Lab 03/24/16 2125 03/26/16 0357 03/27/16 0410  WBC 7.4 8.8 6.7  NEUTROABS 3.3 3.6 2.1  HGB 8.9* 8.1* 7.6*  HCT 24.6* 21.8* 21.0*  MCV 102.5* 99.5 101.4*  PLT 503* 461* 400   Cardiac Enzymes: No results for input(s): CKTOTAL, CKMB, CKMBINDEX, TROPONINI in the last 168 hours. BNP (last 3 results) No results for  input(s): BNP in the last 8760 hours.  ProBNP (last 3 results) No results for input(s): PROBNP in the last 8760 hours.  CBG: No results for input(s): GLUCAP in the last 168 hours.  No results found for this or any previous visit (from the past 240 hour(s)).   Studies: Dg Chest 2 View  03/25/2016  CLINICAL DATA:  Hypoxia.  History of sickle cell disease. EXAM: CHEST  2 VIEW COMPARISON:  03/20/2016 FINDINGS: Normal heart size and pulmonary vascularity. No focal airspace disease or consolidation in the lungs. No blunting of costophrenic angles. No pneumothorax. Mediastinal contours appear intact. Sclerotic bone changes in the spine and right humeral head consistent with bony changes of sickle cell. IMPRESSION: No evidence of active pulmonary disease.  Sickle cell bony changes. Electronically Signed   By: Lucienne Capers M.D.   On: 03/25/2016 06:09   Dg Chest 2 View  03/20/2016  CLINICAL DATA:  Upper back pain.  Sickle cell crisis. EXAM: CHEST  2 VIEW COMPARISON:  02/10/2016 FINDINGS: The heart size and pulmonary vascularity are normal and the lungs are clear. Chronic blunting of the left costophrenic angle. No acute bone abnormality. Chronic sclerosis at the right humeral head. IMPRESSION: No acute abnormalities. Electronically Signed   By: Lorriane Shire M.D.   On: 03/20/2016 07:18    Scheduled Meds: . folic acid  1 mg Oral Daily  . HYDROmorphone   Intravenous Q4H  . hydroxyurea  1,000 mg Oral Daily  . ketorolac  30 mg Intravenous Q6H  . levETIRAcetam  1,000 mg Oral Q12H  . loratadine  10 mg Oral Daily  . methadone  10 mg Oral Q12H  . rivaroxaban  20 mg Oral Q supper  . senna-docusate  1 tablet Oral BID   Continuous Infusions: . sodium chloride 75 mL/hr at 03/26/16 2043    Time spent 25 minutes.

## 2016-03-28 DIAGNOSIS — R42 Dizziness and giddiness: Secondary | ICD-10-CM

## 2016-03-28 DIAGNOSIS — R0902 Hypoxemia: Secondary | ICD-10-CM

## 2016-03-28 DIAGNOSIS — R0609 Other forms of dyspnea: Secondary | ICD-10-CM

## 2016-03-28 MED ORDER — SODIUM CHLORIDE 0.9 % IV SOLN
INTRAVENOUS | Status: AC
  Administered 2016-03-28 – 2016-03-30 (×4): via INTRAVENOUS

## 2016-03-28 NOTE — Progress Notes (Signed)
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 Hypoxemia: Pt was hypoxic overnight and is now requiring 2l/min of Oxygen She developed dizziness with ambulation and c/o dizziness. I will obtain a CT angiogram to evaluate for PE as per patient she has had a gap in taking her Xarelto and also had recent travel from [facility]. Per her Well's criteria she is at moderate risk for PE. If CT angiogram negative for PE would consider ECHO to evaluate for pulmonary HTN.  2. Hb SS with crisis: Pt reports that pain is adequately managed with oral dilaudid . Down to 5/10 and localized to back today.Continue current regimen.  3. Anemia: Hb at baseline. Indices favorable so will continue Hydrea.  4. 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  She is chronically on Xarelto but now admits that she missed some doses of Xarelto recently.  5. Seizure Disorder: Has not had a seizure since 2005. Continue Keppra. 6. Proptosis: TSH found to be within normal limits within the last 2 months . This is likely a variant of normal on this patient. 7. Chronic Homelessness: Working with [employer] to relocate to [place], however still following with her Physician in [facility] for now. 8. Constipation: Last bowel movement yesterday.  Code Status: Full Code Family Communication: N/A Disposition Plan: Discharge delayed by new hypoxia, DOE and  Dizziness.   Bret Stamour A.  Pager [phone]. If 7PM-7AM, please contact night-coverage.  03/28/2016, 4:43 PM  LOS: 3 days   Interim History: Pt reports that she had dizziness today today and has been having intermittent dizziness ambulation for several months. She reports that it feels as though the room is  spinning. This occurs only with ambulation and she has no symptoms at rest. She also states that the dizziness seems to worsen when she starts becoming SOB.  During ambulation today she was noted to have tachycardia, hypoxemia and dizziness. Her orthostatic vitals were positive by HR.  Consultants:  None  Procedures:  None  Antibiotics:  None   Objective: Filed Vitals:   03/28/16 0939 03/28/16 1139 03/28/16 1323 03/28/16 1400  BP: 83/45 90/60 97/49    Pulse: 91  101   Temp: 98.1 F (36.7 C)  97.8 F (36.6 C)   TempSrc: Oral  Oral   Resp: 18  15   Height:      Weight:      SpO2: 99%  99% 88%   Weight change:   Intake/Output Summary (Last 24 hours) at 03/28/16 1643 Last data filed at 03/28/16 0939  Gross per 24 hour  Intake    480 ml  Output      0 ml  Net    480 ml    General: Alert, awake, oriented x3, in no apparent distress.  HEENT: [place]/AT [place]ER[place], EOMI. Anicteric. Proptosis and mild icterus present. Neck: Trachea midline,  no masses, no thyromegal,y no JVD, no carotid bruit OROPHARYNX:  Moist, No exudate/ erythema/lesions.  Heart: Regular rate and rhythm, without murmurs, rubs, gallops, PMI non-displaced, no heaves or thrills on palpation.  Lungs: Clear to auscultation, no wheezing or rhonchi noted. No increased vocal fremitus resonant to percussion. Veins prominent on chest wall. Abdomen: Soft, nontender, nondistended, positive bowel sounds, no masses no hepatosplenomegaly noted.  Neuro: No focal  neurological deficits noted cranial nerves II through XII grossly intact. Strength at functional baseline in bilateral upper and lower extremities. Musculoskeletal: No warmth swelling or erythema around joints, no spinal tenderness noted. Psychiatric: Patient alert and oriented x3, good insight and cognition, good recent to remote recall.    Data Reviewed: Basic Metabolic Panel:  Recent Labs Lab 03/24/16 2125 03/26/16 0357 03/27/16 0410  NA 142 140 137  K 3.3* 4.1  4.0  CL 111 105 105  CO2 24 27 26   GLUCOSE 96 110* 100*  BUN 8 8 7   CREATININE 0.77 0.80 0.67  CALCIUM 8.9 9.0 8.9   Liver Function Tests:  Recent Labs Lab 03/26/16 0357 03/27/16 0410  AST 22 21  ALT 17 16  ALKPHOS 96 95  BILITOT 2.0* 1.3*  PROT 7.4 7.4  ALBUMIN 4.1 4.0   No results for input(s): LIPASE, AMYLASE in the last 168 hours. No results for input(s): AMMONIA in the last 168 hours. CBC:  Recent Labs Lab 03/24/16 2125 03/26/16 0357 03/27/16 0410  WBC 7.4 8.8 6.7  NEUTROABS 3.3 3.6 2.1  HGB 8.9* 8.1* 7.6*  HCT 24.6* 21.8* 21.0*  MCV 102.5* 99.5 101.4*  PLT 503* 461* 400   Cardiac Enzymes: No results for input(s): CKTOTAL, CKMB, CKMBINDEX, TROPONINI in the last 168 hours. BNP (last 3 results) No results for input(s): BNP in the last 8760 hours.  ProBNP (last 3 results) No results for input(s): PROBNP in the last 8760 hours.  CBG: No results for input(s): GLUCAP in the last 168 hours.  No results found for this or any previous visit (from the past 240 hour(s)).   Studies: Dg Chest 2 View  03/25/2016  CLINICAL DATA:  Hypoxia.  History of sickle cell disease. EXAM: CHEST  2 VIEW COMPARISON:  03/20/2016 FINDINGS: Normal heart size and pulmonary vascularity. No focal airspace disease or consolidation in the lungs. No blunting of costophrenic angles. No pneumothorax. Mediastinal contours appear intact. Sclerotic bone changes in the spine and right humeral head consistent with bony changes of sickle cell. IMPRESSION: No evidence of active pulmonary disease.  Sickle cell bony changes. Electronically Signed   By: Lucienne Capers M.D.   On: 03/25/2016 06:09   Dg Chest 2 View  03/20/2016  CLINICAL DATA:  Upper back pain.  Sickle cell crisis. EXAM: CHEST  2 VIEW COMPARISON:  02/10/2016 FINDINGS: The heart size and pulmonary vascularity are normal and the lungs are clear. Chronic blunting of the left costophrenic angle. No acute bone abnormality. Chronic sclerosis at  the right humeral head. IMPRESSION: No acute abnormalities. Electronically Signed   By: Lorriane Shire M.D.   On: 03/20/2016 07:18    Scheduled Meds: . folic acid  1 mg Oral Daily  . HYDROmorphone  4 mg Oral Q4H  . hydroxyurea  1,000 mg Oral Daily  . ketorolac  30 mg Intravenous Q6H  . levETIRAcetam  1,000 mg Oral Q12H  . loratadine  10 mg Oral Daily  . methadone  10 mg Oral Q12H  . rivaroxaban  20 mg Oral Q supper  . senna-docusate  1 tablet Oral BID   Continuous Infusions: . sodium chloride 10 mL/hr at 03/27/16 1710    Time spent 30 minutes.

## 2016-03-28 NOTE — Care Management Important Message (Signed)
Important Message  Patient Details  Name: Heather Oconnell MRN: [id number] Date of Birth: 06-15-1976   Medicare Important Message Given:  Yes    Camillo Flaming 03/28/2016, 10:23 AMImportant Message  Patient Details  Name: Heather Oconnell MRN: [id number] Date of Birth: Mar 19, 1976   Medicare Important Message Given:  Yes    Camillo Flaming 03/28/2016, 10:23 AM

## 2016-03-29 ENCOUNTER — Inpatient Hospital Stay (HOSPITAL_COMMUNITY): Payer: Medicare Other

## 2016-03-29 DIAGNOSIS — R0600 Dyspnea, unspecified: Secondary | ICD-10-CM

## 2016-03-29 DIAGNOSIS — D571 Sickle-cell disease without crisis: Secondary | ICD-10-CM

## 2016-03-29 DIAGNOSIS — D649 Anemia, unspecified: Secondary | ICD-10-CM

## 2016-03-29 LAB — BASIC METABOLIC PANEL
ANION GAP: 5 (ref 5–15)
BUN: 10 mg/dL (ref 6–20)
CHLORIDE: 111 mmol/L (ref 101–111)
CO2: 23 mmol/L (ref 22–32)
Calcium: 8.4 mg/dL — ABNORMAL LOW (ref 8.9–10.3)
Creatinine, Ser: 0.74 mg/dL (ref 0.44–1.00)
GFR calc Af Amer: >60 mL/min (ref >60)
Glucose, Bld: 109 mg/dL — ABNORMAL HIGH (ref 65–99)
POTASSIUM: 4.6 mmol/L (ref 3.5–5.1)
Sodium: 139 mmol/L (ref 135–145)

## 2016-03-29 LAB — CBC WITH DIFFERENTIAL/PLATELET
BASOS ABS: 0 10*3/uL (ref 0.0–0.1)
BASOS PCT: 0 %
EOS PCT: 4 %
Eosinophils Absolute: 0.3 10*3/uL (ref 0.0–0.7)
HCT: 17.5 % — ABNORMAL LOW (ref 36.0–46.0)
HEMOGLOBIN: 6.6 g/dL — AB (ref 12.0–15.0)
LYMPHS PCT: 25 %
Lymphs Abs: 1.8 10*3/uL (ref 0.7–4.0)
MCH: 36.5 pg — ABNORMAL HIGH (ref 26.0–34.0)
MCHC: 37.7 g/dL — ABNORMAL HIGH (ref 30.0–36.0)
MCV: 96.7 fL (ref 78.0–100.0)
MONO ABS: 1.3 10*3/uL — AB (ref 0.1–1.0)
MONOS PCT: 19 %
NEUTROS PCT: 52 %
NRBC: 9 /100{WBCs} — AB
Neutro Abs: 3.7 10*3/uL (ref 1.7–7.7)
PLATELETS: 355 10*3/uL (ref 150–400)
RBC: 1.81 MIL/uL — ABNORMAL LOW (ref 3.87–5.11)
RDW: 24.9 % — ABNORMAL HIGH (ref 11.5–15.5)
WBC: 7.1 10*3/uL (ref 4.0–10.5)

## 2016-03-29 LAB — PREPARE RBC (CROSSMATCH)

## 2016-03-29 LAB — ECHOCARDIOGRAM COMPLETE
HEIGHTINCHES: 68 in
Weight: 2497.37 oz

## 2016-03-29 LAB — RETICULOCYTES
RBC.: 1.78 MIL/uL — ABNORMAL LOW (ref 3.87–5.11)
RETIC COUNT ABSOLUTE: 267 10*3/uL — AB (ref 19.0–186.0)
RETIC CT PCT: 15 % — AB (ref 0.4–3.1)

## 2016-03-29 LAB — ABO/RH: ABO/RH(D): O POS

## 2016-03-29 MED ORDER — DIPHENHYDRAMINE HCL 25 MG PO CAPS
25.0000 mg | ORAL_CAPSULE | Freq: Three times a day (TID) | ORAL | Status: DC | PRN
  Administered 2016-03-29: 25 mg via ORAL
  Filled 2016-03-29: qty 1

## 2016-03-29 MED ORDER — SODIUM CHLORIDE 0.9 % IV SOLN
Freq: Once | INTRAVENOUS | Status: AC
  Administered 2016-03-29: 18:00:00 via INTRAVENOUS

## 2016-03-29 MED ORDER — HYDROMORPHONE HCL 4 MG PO TABS: 4.0000 mg | ORAL_TABLET | ORAL | Status: DC | PRN

## 2016-03-29 MED ORDER — DIPHENHYDRAMINE HCL 25 MG PO CAPS
25.0000 mg | ORAL_CAPSULE | Freq: Once | ORAL | Status: AC
  Administered 2016-03-29: 25 mg via ORAL
  Filled 2016-03-29: qty 1

## 2016-03-29 MED ORDER — ACETAMINOPHEN 325 MG PO TABS
650.0000 mg | ORAL_TABLET | Freq: Once | ORAL | Status: AC
  Administered 2016-03-29: 650 mg via ORAL
  Filled 2016-03-29: qty 2

## 2016-03-29 NOTE — Progress Notes (Signed)
SATURATION QUALIFICATIONS: (This note is used to comply with regulatory documentation for home oxygen)  Patient Saturations on Room Air at Rest = 90%  Patient Saturations on Room Air while Ambulating = 85%  Patient Saturations on 2 Liters of oxygen while Ambulating = 95-97%  Please briefly explain why patient needs home oxygen: Patient unable to maintain oxygen saturation greater than 92% while at rest and desaturates to 80's with exertion.

## 2016-03-29 NOTE — Care Management Note (Addendum)
Case Management Note  Patient Details  Name: Heather Oconnell MRN: [id number] Date of Birth: 1976/03/07  Subjective/Objective:                  Sickle cell pain crisis [facility]) Action/Plan: Discharge planning Expected Discharge Date:  03/29/16              Expected Discharge Plan:  Home/Self Care  In-House Referral:     Discharge planning Services  CM Consult  Post Acute Care Choice:    Choice offered to:  Patient  DME Arranged:  Oxygen DME Agency:  [place]:    [facility] Agency:     Status of Service:  Completed, signed off  Medicare Important Message Given:  Yes Date Medicare IM Given:    Medicare IM give by:    Date Additional Medicare IM Given:    Additional Medicare Important Message give by:     If discussed at [place] of Stay Meetings, dates discussed:    Additional Comments: CM received request for home O2 for patient.  CM called [facility] rep, Manuela Schwartz who will deliver oxygen tank to room prior to discharge.  CM arranged for followup care appointment at the [facility] for Friday Apr 14, 2016 at 2:30 pm; appointment on AVS.   Pt can secure a PCP at this time. Pt states she has her Medicaid documentation completed and will deliver as soon as she is able after discharge.   NO other CM needs were communicated.  Dellie Catholic, RN 03/29/2016, 4:11 PM

## 2016-03-29 NOTE — Progress Notes (Signed)
Patient ambulatory around 75% of unit, O2 sat on room air dropped to 84%, applied 1 liter of O2 sats up to 88 to 89%, increased oxygen to 2 liters was able to maintain above 92%.

## 2016-03-29 NOTE — Discharge Summary (Signed)
Heather Oconnell MRN: [id number] DOB/AGE: August 14, 1976 40 y.o.  Admit date: 03/24/2016 Discharge date: 03/29/2016  Primary Care Physician:  No PCP Per Patient   Discharge Diagnoses:   Patient Active Problem List   Diagnosis Date Noted  . Chronic anticoagulation 03/25/2016  . Sickle cell crisis ([place]) 03/25/2016  . 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     diphenhydrAMINE 25 MG tablet  Commonly known as:  BENADRYL  Take 25 mg by mouth every 6 (six) hours as needed for itching. Takes with Dilaudid     folic acid 1 MG tablet  Commonly known as:  FOLVITE  Take 1 mg by mouth daily.     HYDROmorphone 4 MG tablet  Commonly known as:  DILAUDID  Take 1 tablet (4 mg total) by mouth every 4 (four) hours as needed for severe pain.     hydroxyurea 500 MG capsule  Commonly known as:  HYDREA  Take 1,000 mg by mouth daily. May take with food to minimize GI side effects.     levETIRAcetam 500 MG tablet  Commonly known as:  KEPPRA  Take 1,000 mg by mouth every 12 (twelve) hours.     rivaroxaban 20 MG Tabs tablet  Commonly known as:  XARELTO  Take 20 mg by mouth daily.          Consults:     SIGNIFICANT DIAGNOSTIC STUDIES:  Dg Chest 2 View  03/29/2016  CLINICAL DATA:  Shortness of breath, weakness EXAM: CHEST  2 VIEW COMPARISON:  03/25/2016 FINDINGS: Cardiomediastinal silhouette is stable. No acute infiltrate or pleural effusion. No pulmonary edema. Mild degenerative changes mid thoracic spine. IMPRESSION: No active cardiopulmonary disease. Electronically Signed   By: Lahoma Crocker M.D.   On: 03/29/2016 13:27   Dg Chest 2 View  03/25/2016  CLINICAL DATA:  Hypoxia.   History of sickle cell disease. EXAM: CHEST  2 VIEW COMPARISON:  03/20/2016 FINDINGS: Normal heart size and pulmonary vascularity. No focal airspace disease or consolidation in the lungs. No blunting of costophrenic angles. No pneumothorax. Mediastinal contours appear intact. Sclerotic bone changes in the spine and right humeral head consistent with bony changes of sickle cell. IMPRESSION: No evidence of active pulmonary disease.  Sickle cell bony changes. Electronically Signed   By: Lucienne Capers M.D.   On: 03/25/2016 06:09   Dg Chest 2 View  03/20/2016  CLINICAL DATA:  Upper back pain.  Sickle cell crisis. EXAM: CHEST  2 VIEW COMPARISON:  02/10/2016 FINDINGS: The heart size and pulmonary vascularity are normal and the lungs are clear. Chronic blunting of the left costophrenic angle. No acute bone abnormality. Chronic sclerosis at the right humeral head. IMPRESSION: No acute abnormalities. Electronically Signed   By: Lorriane Shire M.D.   On: 03/20/2016 07:18     ECHO: Left ventricle: The cavity size was normal. Wall thickness was  normal. Systolic function was vigorous. The estimated ejection  fraction was in the range of 65% to 70%. Wall motion was normal;  there were no regional wall motion abnormalities. Left  ventricular diastolic function parameters were normal. -  Mitral valve: There was trivial regurgitation.     No results found for this or any previous visit (from the past 240 hour(s)).  BRIEF ADMITTING H & P: Heather Oconnell is a 40 y.o. female with medical history significant of HGB SS disease, seizure disorder, blood clot in past now on chronic Xarelto. Patient presents to the ED with c/o ongoing sickle cell pain crisis. Symptoms onset 4 weeks ago. Pain located in mid back and BLE and typical of pain crisis. She is supposed to be on methadone and dilaudid but has ran out of meds. No fever no chills, no SOB, no chest pain, no cough.  ED Course: Patient given 6mg  dilaudid  at [facility], was reportedly still having pain, but was also reportedly desaturating while asleep. Had patient transferred to [facility] ED for evaluation prior to admission. Patient is currently in room and sating 91% while talking with me.    Hospital Course:  Present on Admission:  . Hypoxic respiratory failure: Pt was relativly hypoxic on admission and required Oxygen during sleep. However as her hospital course progressed she became more hypoxic and more profoundly so with ambulation.The ambulatory hypoxia was accompanied by dizziness and DOE.  It was felt that her decreased Hb was contributing to hypoxemia. She was transfused 1 unit RBC's and the hypoxia improved but did not resolve completely.She had an ECHO which was negative for pulmonary Hypertension. Pt is on Xarelto for recurrent DVT's but had a lapse in therapy and recent long distance car travel. This raised the suspicion for possible PE. A CT angiogram was ordered but patient had poor IV access and could not receive a PICC line secondary to previous clots in central veins. I discussed this with both radiologist and hematologist. Radiologist recommended against of a VQ scan for evaluation after his review of her x-ray. Hematologist recommended that she does be continued on Xarelto without any new interventions as her last in therapy sufficiently accounted for possible PE. She was hemodynamically stable and is resumed on his arousal to the current dose for long-term therapy. The patient be discharged home on oxygen 2 L/m for around-the-clock use. She has an appointment for post-hospital care on 04/14/2016 at which time she should have her oxygenation re-evaluated.  . 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 She was resumed on methadone 10 mg twice a day for her chronic pain. As her pain improved she was transitioned to oral medications. She's being discharged home regimen of Dilaudid 4 mg every 4  hours when necessary. The patient states that she has no more methadone at home and has severed her relationship with her previous provider. I have written the patient a prescription for Dilaudid 4 mg #30 tabs. Unfortunately with the requirement for surveillance with methadone and as many side effects I feel that we'll put the patient at greater risk for a prescription for methadone without adequate surveillance. An appointment is being set up for the patient to be seen at the sickle cell [facility] where she can obtain a primary care provider and at that time discuss long-term management of her chronic pain.    . 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 A review her records from previous hospitalizations and from outside hospitals confirm a baseline Hb of 8-9. Given her symptoms and an almost 3 gram decrease in Hb she was transfused 1  unit RBC's. At the time of discharge her Hb was 7.9 g/dl. She is resumed on Hydrea.   . Chronic Anticoagulation: Pt has a h/o recurrent DVT's and is on Xarelto. She had no evidence of bleeding and is continued on Xarelto.    . Seizure Disorder: No seizure activity during hospital course. Continued on Keppra.    Disposition and Follow-up: Pt is discharged home in good condition and has a follow up appoinment with [place] on 04/14/2016   DISCHARGE EXAM:  General: Alert, awake, oriented x3, in mild distress.  Vital Signs: BP 117/62, HR 95, T 97.4 F (36.3 C), temperature source Oral, RR 18, height 5\' 8"  (1.727 m), weight 156 lb 1.4 oz (70.8 kg), last menstrual period 03/18/2016, SpO2 96 % on 2 l/mon of Oxygen. HEENT: [place]/AT PEERL, EOMI, anicteric Neck: Trachea midline, no masses, no thyromegal,y no JVD, no carotid bruit OROPHARYNX: Moist, No exudate/ erythema/lesions.  Heart: Regular rate and rhythm, without murmurs, rubs, gallops or S3. PMI non-displaced. Exam reveals no  decreased pulses. Pulmonary/Chest: Normal effort. Breath sounds normal. No. Apnea. Clear to auscultation,no stridor,  no wheezing and no rhonchi noted. No respiratory distress and no tenderness noted. Abdomen: Soft, nontender, nondistended, normal bowel sounds, no masses no hepatosplenomegaly noted. No fluid wave and no ascites. There is no guarding or rebound. Neuro: Alert and oriented to person, place and time. Normal motor skills, Displays no atrophy or tremors and exhibits normal muscle tone.  No focal neurological deficits noted cranial nerves II through XII grossly intact. No sensory deficit noted.  Strength at baseline in bilateral upper and lower extremities. Gait normal. Musculoskeletal: No warm swelling or erythema around joints, no spinal tenderness noted. Psychiatric: Patient alert and oriented x3, good insight and cognition, good recent to remote recall. Mood, memory, affect and judgement normal Lymph node survey: No cervical axillary or inguinal lymphadenopathy noted. Skin: Skin is warm and dry. No bruising, no ecchymosis and no rash noted. Pt is not diaphoretic. No erythema. No pallor      Recent Labs  03/27/16 0410 03/29/16 0846  NA 137 139  K 4.0 4.6  CL 105 111  CO2 26 23  GLUCOSE 100* 109*  BUN 7 10  CREATININE 0.67 0.74  CALCIUM 8.9 8.4*    Recent Labs  03/27/16 0410  AST 21  ALT 16  ALKPHOS 95  BILITOT 1.3*  PROT 7.4  ALBUMIN 4.0   No results for input(s): LIPASE, AMYLASE in the last 72 hours.  Recent Labs  03/27/16 0410 03/29/2016 0846 03/30/16 1046  WBC 6.7 7.1 5.9  NEUTROABS 2.1 3.7 2.5  HGB 7.6* 6.6* 7.9  HCT 21.0* 17.5* 21.4  MCV 101.4* 96.7 96.3  PLT 400 355 384     Total time spent including face to face and decision making was greater than 30 minutes  Signed: Siris Hoos A. 03/29/2016, 3:44 PM

## 2016-03-29 NOTE — Progress Notes (Signed)
  Echocardiogram 2D Echocardiogram has been performed.  Diamond Nickel 03/29/2016, 9:46 AM

## 2016-03-29 NOTE — Progress Notes (Signed)
Patient ID: Heather Oconnell, female   DOB: July 16, 1976, 40 y.o.   MRN: [id number] Reviewed records from previous hospitalization and from outside hospitals. Her baseline Hb is between 9-10. This is more than a 3 gram drop in Hb so will transfuse 1 unit blood.

## 2016-03-29 NOTE — Progress Notes (Signed)
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 Hypoxemia: Pt  Is still requiring 2l/min of Oxygen. Unfortunately she has very poor IV access and cannot receive PICC or MID lines because of her clotted off vessels. She reports that she has had clots in her SVC and B/L IJ veins in addition to clots in  BUE's. I have consuklted with Dr. Kara Dies nd Dr. Norval Gable- Hematology both of whom advise against a V/Q scan and thus I will continue Xarelto and check CXR to make sure no new infiltrates. Will continue current dose of Xarelto as weight is < 100 kg.  2. Hb SS with crisis: Pt reports that pain is adequately managed with oral dilaudid . Down to 5/10 and localized to back today.Continue current regimen.  3. Anemia: Hb has been drifting down and is now at 6.6 which is 3 grams lower than her baseline. She has no clinical evidence of increased hemolysis and no evidence of bleeding.  4. 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  She is chronically on Xarelto but now admits that she missed some doses of Xarelto recently.  5. Seizure Disorder: Has not had a seizure since 2005. Continue Keppra. 6. Proptosis: TSH found to be within normal limits within the last 2 months . This is likely a variant of normal on this patient. 7. Chronic Homelessness: Working with [employer] to relocate to [place], however still following with her Physician in [facility] for now. 8. Constipation: Last bowel movement yesterday.  Code Status: Full Code Family Communication: N/A Disposition Plan: Discharge delayed by new hypoxia, DOE and  Dizziness.   MATTHEWS,MICHELLE A.  Pager [phone]. If 7PM-7AM, please contact night-coverage.  03/29/2016, 12:53 PM  LOS:  4 days   Interim History: Pt reports that the dizziness has improved today and she was able to ambulate around the unit without difficulty except that her Oxygen saturations have remained low. A CXR was not performed as she was to receive a CT angiogram. However she has no appropriate IV access and trial by IV expert team was unsuccessful. She cannot have a PICC line placed due to previous thrombosis of central veins. I have spoken with both Radiology and Hematology who recommend against V/Q scan and Hematology recommends just continuing with Xarelto given the window of non-adherence.   Consultants:  None  Procedures:  None  Antibiotics:  None   Objective: Filed Vitals:   03/28/16 2153 03/29/16 0644 03/29/16 0900 03/29/16 1057  BP: 106/66 85/47  117/62  Pulse: 96 92  95  Temp: 97.8 F (36.6 C) 97.7 F (36.5 C)  97.4 F (36.3 C)  TempSrc: Oral Oral  Oral  Resp: 16 18  18   Height:      Weight:      SpO2: 100% 99% 96% 100%   Weight change:   Intake/Output Summary (Last 24 hours) at 03/29/16 1253 Last data filed at 03/29/16 0644  Gross per 24 hour  Intake 3402.5 ml  Output      0 ml  Net 3402.5 ml    General: Alert, awake, oriented x3, in no apparent distress. Face appears a bit more puffy today.  HEENT: [place]/AT PEERL, EOMI. Anicteric. Proptosis and mild icterus present. Neck: Trachea midline,  no masses, no thyromegal,y  no JVD, no carotid bruit OROPHARYNX:  Moist, No exudate/ erythema/lesions.  Heart: Regular rate and rhythm, without murmurs, rubs, gallops, PMI non-displaced, no heaves or thrills on palpation.  Lungs: Clear to auscultation, no wheezing or rhonchi noted. No increased vocal fremitus resonant to percussion. Veins prominent on chest wall. Abdomen: Soft, nontender, nondistended, positive bowel sounds, no masses no hepatosplenomegaly noted.  Neuro: No focal neurological deficits noted cranial nerves II through XII grossly intact. Strength at functional baseline  in bilateral upper and lower extremities. Musculoskeletal: No warmth swelling or erythema around joints, no spinal tenderness noted. Psychiatric: Patient alert and oriented x3, good insight and cognition, good recent to remote recall.    Data Reviewed: Basic Metabolic Panel:  Recent Labs Lab 03/24/16 2125 03/26/16 0357 03/27/16 0410 03/29/16 0846  NA 142 140 137 139  K 3.3* 4.1 4.0 4.6  CL 111 105 105 111  CO2 24 27 26 23   GLUCOSE 96 110* 100* 109*  BUN 8 8 7 10   CREATININE 0.77 0.80 0.67 0.74  CALCIUM 8.9 9.0 8.9 8.4*   Liver Function Tests:  Recent Labs Lab 03/26/16 0357 03/27/16 0410  AST 22 21  ALT 17 16  ALKPHOS 96 95  BILITOT 2.0* 1.3*  PROT 7.4 7.4  ALBUMIN 4.1 4.0   No results for input(s): LIPASE, AMYLASE in the last 168 hours. No results for input(s): AMMONIA in the last 168 hours. CBC:  Recent Labs Lab 03/24/16 2125 03/26/16 0357 03/27/16 0410 03/29/16 0846  WBC 7.4 8.8 6.7 7.1  NEUTROABS 3.3 3.6 2.1 3.7  HGB 8.9* 8.1* 7.6* 6.6*  HCT 24.6* 21.8* 21.0* 17.5*  MCV 102.5* 99.5 101.4* 96.7  PLT 503* 461* 400 355   Cardiac Enzymes: No results for input(s): CKTOTAL, CKMB, CKMBINDEX, TROPONINI in the last 168 hours. BNP (last 3 results) No results for input(s): BNP in the last 8760 hours.  ProBNP (last 3 results) No results for input(s): PROBNP in the last 8760 hours.  CBG: No results for input(s): GLUCAP in the last 168 hours.  No results found for this or any previous visit (from the past 240 hour(s)).   Studies: Dg Chest 2 View  03/25/2016  CLINICAL DATA:  Hypoxia.  History of sickle cell disease. EXAM: CHEST  2 VIEW COMPARISON:  03/20/2016 FINDINGS: Normal heart size and pulmonary vascularity. No focal airspace disease or consolidation in the lungs. No blunting of costophrenic angles. No pneumothorax. Mediastinal contours appear intact. Sclerotic bone changes in the spine and right humeral head consistent with bony changes of sickle cell.  IMPRESSION: No evidence of active pulmonary disease.  Sickle cell bony changes. Electronically Signed   By: Lucienne Capers M.D.   On: 03/25/2016 06:09   Dg Chest 2 View  03/20/2016  CLINICAL DATA:  Upper back pain.  Sickle cell crisis. EXAM: CHEST  2 VIEW COMPARISON:  02/10/2016 FINDINGS: The heart size and pulmonary vascularity are normal and the lungs are clear. Chronic blunting of the left costophrenic angle. No acute bone abnormality. Chronic sclerosis at the right humeral head. IMPRESSION: No acute abnormalities. Electronically Signed   By: Lorriane Shire M.D.   On: 03/20/2016 07:18    Scheduled Meds: . folic acid  1 mg Oral Daily  . HYDROmorphone  4 mg Oral Q4H  . hydroxyurea  1,000 mg Oral Daily  . levETIRAcetam  1,000 mg Oral Q12H  . loratadine  10 mg Oral Daily  . methadone  10 mg Oral Q12H  . rivaroxaban  20 mg  Oral Q supper  . senna-docusate  1 tablet Oral BID   Continuous Infusions: . sodium chloride 10 mL/hr at 03/29/16 1249    Time spent 30 minutes.

## 2016-03-30 DIAGNOSIS — D638 Anemia in other chronic diseases classified elsewhere: Secondary | ICD-10-CM

## 2016-03-30 LAB — CBC WITH DIFFERENTIAL/PLATELET
BASOS ABS: 0 10*3/uL (ref 0.0–0.1)
Basophils Relative: 0 %
Eosinophils Absolute: 0.4 10*3/uL (ref 0.0–0.7)
Eosinophils Relative: 6 %
HEMATOCRIT: 21.4 % — AB (ref 36.0–46.0)
Hemoglobin: 7.9 g/dL — ABNORMAL LOW (ref 12.0–15.0)
LYMPHS ABS: 1.8 10*3/uL (ref 0.7–4.0)
Lymphocytes Relative: 31 %
MCH: 35.7 pg — ABNORMAL HIGH (ref 26.0–34.0)
MCHC: 36.9 g/dL — ABNORMAL HIGH (ref 30.0–36.0)
MCV: 96.8 fL (ref 78.0–100.0)
MONO ABS: 1.2 10*3/uL — AB (ref 0.1–1.0)
Monocytes Relative: 20 %
Neutro Abs: 2.5 10*3/uL (ref 1.7–7.7)
Neutrophils Relative %: 43 %
PLATELETS: 384 10*3/uL (ref 150–400)
RBC: 2.21 MIL/uL — AB (ref 3.87–5.11)
RDW: 22.9 % — AB (ref 11.5–15.5)
WBC: 5.9 10*3/uL (ref 4.0–10.5)

## 2016-03-30 LAB — BASIC METABOLIC PANEL
Anion gap: 5 (ref 5–15)
BUN: 6 mg/dL (ref 6–20)
CALCIUM: 8.7 mg/dL — AB (ref 8.9–10.3)
CO2: 24 mmol/L (ref 22–32)
CREATININE: 0.73 mg/dL (ref 0.44–1.00)
Chloride: 111 mmol/L (ref 101–111)
Glucose, Bld: 109 mg/dL — ABNORMAL HIGH (ref 65–99)
Potassium: 4.2 mmol/L (ref 3.5–5.1)
SODIUM: 140 mmol/L (ref 135–145)

## 2016-03-30 LAB — LACTATE DEHYDROGENASE: LDH: 287 U/L — ABNORMAL HIGH (ref 98–192)

## 2016-03-30 NOTE — Progress Notes (Addendum)
[facility] delivered portable oxygen tank to pt's room. Pt on Xarelto prior to admission. Jonnie Finner RN CCM Case Mgmt phone [phone]

## 2016-03-30 NOTE — Progress Notes (Signed)
SATURATION QUALIFICATIONS: (This note is used to comply with regulatory documentation for home oxygen)  Patient Saturations on Room Air at Rest = 96%  Patient Saturations on Room Air while Ambulating = 88%  Patient Saturations on 2 Liters of oxygen while Ambulating = 98%  Please briefly explain why patient needs home oxygen:

## 2016-04-02 ENCOUNTER — Emergency Department (HOSPITAL_COMMUNITY)
Admission: EM | Admit: 2016-04-02 | Discharge: 2016-04-02 | Disposition: A | Discharge status: Home / Self Care | Payer: Medicare Other | Attending: Emergency Medicine | Admitting: Emergency Medicine

## 2016-04-02 ENCOUNTER — Encounter (HOSPITAL_COMMUNITY): Payer: Self-pay

## 2016-04-02 DIAGNOSIS — Z91040 Latex allergy status: Secondary | ICD-10-CM | POA: Diagnosis not present

## 2016-04-02 DIAGNOSIS — Z8673 Personal history of transient ischemic attack (TIA), and cerebral infarction without residual deficits: Secondary | ICD-10-CM | POA: Insufficient documentation

## 2016-04-02 DIAGNOSIS — Z79899 Other long term (current) drug therapy: Secondary | ICD-10-CM | POA: Insufficient documentation

## 2016-04-02 DIAGNOSIS — Z7901 Long term (current) use of anticoagulants: Secondary | ICD-10-CM | POA: Insufficient documentation

## 2016-04-02 DIAGNOSIS — Z862 Personal history of diseases of the blood and blood-forming organs and certain disorders involving the immune mechanism: Secondary | ICD-10-CM | POA: Diagnosis not present

## 2016-04-02 DIAGNOSIS — R220 Localized swelling, mass and lump, head: Secondary | ICD-10-CM | POA: Diagnosis present

## 2016-04-02 DIAGNOSIS — R600 Localized edema: Secondary | ICD-10-CM

## 2016-04-02 DIAGNOSIS — R609 Edema, unspecified: Secondary | ICD-10-CM | POA: Diagnosis not present

## 2016-04-02 DIAGNOSIS — M79606 Pain in leg, unspecified: Secondary | ICD-10-CM | POA: Diagnosis not present

## 2016-04-02 LAB — BASIC METABOLIC PANEL
Anion gap: 9 (ref 5–15)
BUN: 5 mg/dL — AB (ref 6–20)
CHLORIDE: 103 mmol/L (ref 101–111)
CO2: 27 mmol/L (ref 22–32)
Calcium: 8.5 mg/dL — ABNORMAL LOW (ref 8.9–10.3)
Creatinine, Ser: 0.76 mg/dL (ref 0.44–1.00)
GFR calc Af Amer: >60 mL/min (ref >60)
GFR calc non Af Amer: >60 mL/min (ref >60)
Glucose, Bld: 98 mg/dL (ref 65–99)
POTASSIUM: 3.3 mmol/L — AB (ref 3.5–5.1)
SODIUM: 139 mmol/L (ref 135–145)

## 2016-04-02 LAB — CBC WITH DIFFERENTIAL/PLATELET
BASOS ABS: 0 10*3/uL (ref 0.0–0.1)
BASOS PCT: 0 %
EOS PCT: 0 %
Eosinophils Absolute: 0 10*3/uL (ref 0.0–0.7)
HEMATOCRIT: 22.3 % — AB (ref 36.0–46.0)
Hemoglobin: 8.2 g/dL — ABNORMAL LOW (ref 12.0–15.0)
Lymphocytes Relative: 37 %
Lymphs Abs: 2.2 10*3/uL (ref 0.7–4.0)
MCH: 36.6 pg — ABNORMAL HIGH (ref 26.0–34.0)
MCHC: 36.8 g/dL — AB (ref 30.0–36.0)
MCV: 99.6 fL (ref 78.0–100.0)
MONOS PCT: 9 %
Monocytes Absolute: 0.5 10*3/uL (ref 0.1–1.0)
NEUTROS ABS: 3.2 10*3/uL (ref 1.7–7.7)
Neutrophils Relative %: 54 %
Platelets: 440 10*3/uL — ABNORMAL HIGH (ref 150–400)
RBC: 2.24 MIL/uL — AB (ref 3.87–5.11)
RDW: 25 % — ABNORMAL HIGH (ref 11.5–15.5)
WBC: 5.9 10*3/uL (ref 4.0–10.5)
nRBC: 60 /100 WBC — ABNORMAL HIGH

## 2016-04-02 LAB — TYPE AND SCREEN
ABO/RH(D): O POS
ANTIBODY SCREEN: NEGATIVE
UNIT DIVISION: 0
Unit division: 0

## 2016-04-02 LAB — RETICULOCYTES
RBC.: 2.24 MIL/uL — AB (ref 3.87–5.11)
RETIC COUNT ABSOLUTE: 327 10*3/uL — AB (ref 19.0–186.0)
Retic Ct Pct: 14.6 % — ABNORMAL HIGH (ref 0.4–3.1)

## 2016-04-02 MED ORDER — DIPHENHYDRAMINE HCL 25 MG PO TABS: 25.0000 mg | ORAL_TABLET | Freq: Four times a day (QID) | ORAL | Status: AC

## 2016-04-02 MED ORDER — FAMOTIDINE 20 MG PO TABS
20.0000 mg | ORAL_TABLET | Freq: Once | ORAL | Status: AC
  Administered 2016-04-02: 20 mg via ORAL
  Filled 2016-04-02: qty 1

## 2016-04-02 MED ORDER — METHYLPREDNISOLONE SODIUM SUCC 125 MG IJ SOLR
INTRAMUSCULAR | Status: AC
  Filled 2016-04-02: qty 2

## 2016-04-02 MED ORDER — PREDNISONE 10 MG PO TABS: 60.0000 mg | ORAL_TABLET | Freq: Every day | ORAL | Status: DC

## 2016-04-02 MED ORDER — FAMOTIDINE 20 MG PO TABS: 20.0000 mg | ORAL_TABLET | Freq: Two times a day (BID) | ORAL | Status: AC

## 2016-04-02 MED ORDER — MORPHINE SULFATE (PF) 4 MG/ML IV SOLN
4.0000 mg | Freq: Once | INTRAVENOUS | Status: AC
  Administered 2016-04-02: 4 mg via INTRAVENOUS
  Filled 2016-04-02: qty 1

## 2016-04-02 MED ORDER — DIPHENHYDRAMINE HCL 50 MG/ML IJ SOLN
50.0000 mg | Freq: Once | INTRAMUSCULAR | Status: AC
  Administered 2016-04-02: 50 mg via INTRAVENOUS
  Filled 2016-04-02: qty 1

## 2016-04-02 MED ORDER — METHYLPREDNISOLONE SODIUM SUCC 125 MG IJ SOLR
125.0000 mg | Freq: Once | INTRAMUSCULAR | Status: AC
  Administered 2016-04-02: 125 mg via INTRAVENOUS
  Filled 2016-04-02: qty 2

## 2016-04-02 MED ORDER — MORPHINE SULFATE (PF) 4 MG/ML IV SOLN: 4.0000 mg | Freq: Once | INTRAVENOUS | Status: DC

## 2016-04-02 NOTE — ED Provider Notes (Signed)
CSN: [id number]     Arrival date & time 04/02/16  1359 History   First MD Initiated Contact with Patient 04/02/16 1423     Chief Complaint  Patient presents with  . Facial Swelling     (Consider location/radiation/quality/duration/timing/severity/associated sxs/prior Treatment) HPI Patient started developing facial swelling yesterday evening. She reports her lower face her neck and her eyes have become very swollen. She denies she's never had this happen to her before. She reports it is uncomfortable and somewhat painful. She denies she's having difficulty breathing. She reports if she gets in a certain position is uncomfortable breathing but she does not feel like she can't. She reports it does feel also somewhat uncomfortable to swallow. She notes her solid swelling that she also perceives at the upper part of her chest. No wheezing no lower extremity swelling. Patient thinks that this is somehow associated with the blood transfusions she received while hospitalized. She has not tried Benadryl at home. She has been taking Zyrtec as per her regular regimen. He denies any other new medications. She is denying any pain that she thinks is typical of her sickle cell crisis. No rash or general pruritus of her body. Past Medical History  Diagnosis Date  . Sickle cell disease ([place])   . Seizures ([place])   . Stroke ([place])   . Blood clot due to device, implant, or graft    History reviewed. No pertinent past surgical history. No family history on file. Social History  Substance Use Topics  . Smoking status: Never Smoker   . Smokeless tobacco: Never Used  . Alcohol Use: No   OB History    No data available     Review of Systems 10 Systems reviewed and are negative for acute change except as noted in the HPI.    Allergies  Demerol; Fentanyl; Influenza vaccines; Oxycontin; and Latex  Home Medications   Prior to Admission medications   Medication Sig Start Date End Date Taking? Authorizing  Provider  cetirizine (ZYRTEC) 10 MG tablet Take 10 mg by mouth daily.    Yes Historical Provider, MD  diphenhydrAMINE (BENADRYL) 25 MG tablet Take 25 mg by mouth every 6 (six) hours as needed for itching. Takes with Dilaudid   Yes Historical Provider, MD  diphenhydramine-acetaminophen (TYLENOL PM) 25-500 MG TABS tablet Take 1 tablet by mouth every 6 (six) hours as needed (pain).   Yes Historical Provider, MD  folic acid (FOLVITE) 1 MG tablet Take 1 mg by mouth daily.   Yes Historical Provider, MD  hydroxyurea (HYDREA) 500 MG capsule Take 1,000 mg by mouth daily. May take with food to minimize GI side effects.   Yes Historical Provider, MD  levETIRAcetam (KEPPRA) 500 MG tablet Take 1,000 mg by mouth every 12 (twelve) hours.   Yes Historical Provider, MD  rivaroxaban (XARELTO) 20 MG TABS tablet Take 20 mg by mouth daily.   Yes Historical Provider, MD  HYDROmorphone (DILAUDID) 4 MG tablet Take 1 tablet (4 mg total) by mouth every 4 (four) hours as needed for severe pain. 03/29/16   Leana Gamer, MD   BP 124/84 mmHg  Pulse 71  Temp(Src) 98.1 F (36.7 C) (Oral)  Resp 18  SpO2 100%  LMP 03/18/2016 (Approximate) Physical Exam  Constitutional: She is oriented to person, place, and time. She appears well-developed and well-nourished.  Patient is alert and nontoxic. She has moderate to large swelling of her lower face and neck. No respiratory distress.  HENT:  Head: Normocephalic and  atraumatic.  Patient has puffy edema bilateral eyes. Her lower face and her neck are moderately to largely swollen diffusely. Her oral cavity is patent without any tongue swelling. She does have white, serpiginous plaques on her tongue. Tongue is mobile in her mouth. No stridor. Mallampati class 4.  Eyes: EOM are normal. Pupils are equal, round, and reactive to light.  Neck: Neck supple.  Cardiovascular: Normal rate, regular rhythm, normal heart sounds and intact distal pulses.   Pulmonary/Chest: Effort normal and  breath sounds normal. No stridor.  Abdominal: Soft. Bowel sounds are normal. She exhibits no distension. There is no tenderness.  Musculoskeletal: Normal range of motion. She exhibits no edema or tenderness.  Neurological: She is alert and oriented to person, place, and time. She has normal strength. No cranial nerve deficit. She exhibits normal muscle tone. Coordination normal. GCS eye subscore is 4. GCS verbal subscore is 5. GCS motor subscore is 6.  Skin: Skin is warm, dry and intact.  Psychiatric: She has a normal mood and affect.    ED Course  Procedures (including critical care time) Labs Review Labs Reviewed  BASIC METABOLIC PANEL  CBC WITH DIFFERENTIAL/PLATELET  RETICULOCYTES    Imaging Review No results found. I have personally reviewed and evaluated these images and lab results as part of my medical decision-making.   EKG Interpretation None      MDM   Final diagnoses:  Facial edema   Patient presents with isolated facial edema. This is also around her neck. Patient's oral cavity is open without evidence of angioedema of her tongue. Patient feels this is a result of a recent blood transfusion. She however has no other symptoms of general illness, weakness, vital sign instability, fever. Findings for either angioedema versus allergic reaction. At this time patient is been given Benadryl, slight Medrol and Pepcid. Plan will be to observe for any worsening or changing. At that time, determination for possible discharge on prednisone, Benadryl and Pepcid. Currently patient's airway is very stable and she does not have intraoral angioedema. Reviewed with Dr. Venora Maples who will recheck patient for response to treatment and final disposition.    Charlesetta Shanks, MD 04/02/16 [phone]

## 2016-04-02 NOTE — ED Notes (Signed)
PATIENT NEEDS A ULTRASOUND IV

## 2016-04-02 NOTE — ED Notes (Signed)
Bed: [id number] Expected date: 04/02/16 Expected time: 1:51 PM Means of arrival:  Comments: Sickle cell ? Facial swelling

## 2016-04-02 NOTE — ED Notes (Signed)
Pt presents via EMS from home with c/o facial swelling. Pt reports she was seen last Thursday for pain in her legs related to her sickle cell. Pt reports she woke up yesterday with facial swelling, swelling is notable. Pt reports the swelling has not caused her any breathing difficulty, lung sounds clear. Pt also reports the swelling is in her neck and armpits as well. No other complaints.

## 2016-04-02 NOTE — ED Notes (Signed)
Only one solumedrol given to patient. 2 vials pulled from [facility] because one of the stoppers would not fall into the bottle to mix medication.

## 2016-04-02 NOTE — Discharge Instructions (Signed)
Edema °Edema is an abnormal buildup of fluids in your body tissues. Edema is somewhat dependent on gravity to pull the fluid to the lowest place in your body. That makes the condition more common in the legs and thighs (lower extremities). Painless swelling of the feet and ankles is common and becomes more likely as you get older. It is also common in looser tissues, like around your eyes.  °When the affected area is squeezed, the fluid may move out of that spot and leave a dent for a few moments. This dent is called pitting.  °CAUSES  °There are many possible causes of edema. Eating too much salt and being on your feet or sitting for a long time can cause edema in your legs and ankles. Hot weather may make edema worse. Common medical causes of edema include: °· Heart failure. °· Liver disease. °· Kidney disease. °· Weak blood vessels in your legs. °· Cancer. °· An injury. °· Pregnancy. °· Some medications. °· Obesity.  °SYMPTOMS  °Edema is usually painless. Your skin may look swollen or shiny.  °DIAGNOSIS  °Your health care provider may be able to diagnose edema by asking about your medical history and doing a physical exam. You may need to have tests such as X-rays, an electrocardiogram, or blood tests to check for medical conditions that may cause edema.  °TREATMENT  °Edema treatment depends on the cause. If you have heart, liver, or kidney disease, you need the treatment appropriate for these conditions. General treatment may include: °· Elevation of the affected body part above the level of your heart. °· Compression of the affected body part. Pressure from elastic bandages or support stockings squeezes the tissues and forces fluid back into the blood vessels. This keeps fluid from entering the tissues. °· Restriction of fluid and salt intake. °· Use of a water pill (diuretic). These medications are appropriate only for some types of edema. They pull fluid out of your body and make you urinate more often. This  gets rid of fluid and reduces swelling, but diuretics can have side effects. Only use diuretics as directed by your health care provider. °HOME CARE INSTRUCTIONS  °· Keep the affected body part above the level of your heart when you are lying down.   °· Do not sit still or stand for prolonged periods.   °· Do not put anything directly under your knees when lying down. °· Do not wear constricting clothing or garters on your upper legs.   °· Exercise your legs to work the fluid back into your blood vessels. This may help the swelling go down.   °· Wear elastic bandages or support stockings to reduce ankle swelling as directed by your health care provider.   °· Eat a low-salt diet to reduce fluid if your health care provider recommends it.   °· Only take medicines as directed by your health care provider.  °SEEK MEDICAL CARE IF:  °· Your edema is not responding to treatment. °· You have heart, liver, or kidney disease and notice symptoms of edema. °· You have edema in your legs that does not improve after elevating them.   °· You have sudden and unexplained weight gain. °SEEK IMMEDIATE MEDICAL CARE IF:  °· You develop shortness of breath or chest pain.   °· You cannot breathe when you lie down. °· You develop pain, redness, or warmth in the swollen areas.   °· You have heart, liver, or kidney disease and suddenly get edema. °· You have a fever and your symptoms suddenly get worse. °MAKE SURE YOU:  °·   Understand these instructions. °· Will watch your condition. °· Will get help right away if you are not doing well or get worse. °  °This information is not intended to replace advice given to you by your health care provider. Make sure you discuss any questions you have with your health care provider. °  °Document Released: 11/20/2005 Document Revised: 12/11/2014 Document Reviewed: 09/12/2013 °Elsevier Interactive Patient Education ©2016 [employer] ° °

## 2016-04-11 ENCOUNTER — Emergency Department (HOSPITAL_BASED_OUTPATIENT_CLINIC_OR_DEPARTMENT_OTHER)
Admission: EM | Admit: 2016-04-11 | Discharge: 2016-04-11 | Disposition: A | Discharge status: Home / Self Care | Payer: Medicare Other | Attending: Emergency Medicine | Admitting: Emergency Medicine

## 2016-04-11 ENCOUNTER — Encounter (HOSPITAL_BASED_OUTPATIENT_CLINIC_OR_DEPARTMENT_OTHER): Payer: Self-pay | Admitting: *Deleted

## 2016-04-11 DIAGNOSIS — Z8673 Personal history of transient ischemic attack (TIA), and cerebral infarction without residual deficits: Secondary | ICD-10-CM | POA: Diagnosis not present

## 2016-04-11 DIAGNOSIS — M791 Myalgia: Secondary | ICD-10-CM | POA: Diagnosis present

## 2016-04-11 DIAGNOSIS — D5700 Hb-SS disease with crisis, unspecified: Secondary | ICD-10-CM | POA: Diagnosis not present

## 2016-04-11 LAB — BASIC METABOLIC PANEL
Anion gap: 4 — ABNORMAL LOW (ref 5–15)
BUN: 8 mg/dL (ref 6–20)
CALCIUM: 8.1 mg/dL — AB (ref 8.9–10.3)
CO2: 31 mmol/L (ref 22–32)
Chloride: 103 mmol/L (ref 101–111)
Creatinine, Ser: 0.76 mg/dL (ref 0.44–1.00)
GFR calc Af Amer: >60 mL/min (ref >60)
GFR calc non Af Amer: >60 mL/min (ref >60)
GLUCOSE: 112 mg/dL — AB (ref 65–99)
Potassium: 3 mmol/L — ABNORMAL LOW (ref 3.5–5.1)
Sodium: 138 mmol/L (ref 135–145)

## 2016-04-11 LAB — CBC WITH DIFFERENTIAL/PLATELET
BASOS ABS: 0 10*3/uL (ref 0.0–0.1)
Basophils Relative: 0 %
EOS ABS: 0.2 10*3/uL (ref 0.0–0.7)
EOS PCT: 2 %
HEMATOCRIT: 28.8 % — AB (ref 36.0–46.0)
Hemoglobin: 10.2 g/dL — ABNORMAL LOW (ref 12.0–15.0)
Lymphocytes Relative: 39 %
Lymphs Abs: 3.2 10*3/uL (ref 0.7–4.0)
MCH: 38.5 pg — ABNORMAL HIGH (ref 26.0–34.0)
MCHC: 35.4 g/dL (ref 30.0–36.0)
MCV: 108.7 fL — ABNORMAL HIGH (ref 78.0–100.0)
MONO ABS: 1.1 10*3/uL — AB (ref 0.1–1.0)
Monocytes Relative: 14 %
NEUTROS PCT: 45 %
Neutro Abs: 3.6 10*3/uL (ref 1.7–7.7)
PLATELETS: 538 10*3/uL — AB (ref 150–400)
RBC: 2.65 MIL/uL — AB (ref 3.87–5.11)
RDW: 18.1 % — AB (ref 11.5–15.5)
WBC: 8.1 10*3/uL (ref 4.0–10.5)

## 2016-04-11 LAB — RETICULOCYTES
RBC.: 2.71 MIL/uL — AB (ref 3.87–5.11)
RETIC COUNT ABSOLUTE: 181.6 10*3/uL (ref 19.0–186.0)
RETIC CT PCT: 6.7 % — AB (ref 0.4–3.1)

## 2016-04-11 MED ORDER — HYDROMORPHONE HCL 1 MG/ML IJ SOLN
2.0000 mg | Freq: Once | INTRAMUSCULAR | Status: AC
  Administered 2016-04-11: 2 mg via INTRAVENOUS
  Filled 2016-04-11: qty 2

## 2016-04-11 MED ORDER — SODIUM CHLORIDE 0.9 % IV BOLUS (SEPSIS)
1000.0000 mL | Freq: Once | INTRAVENOUS | Status: AC
  Administered 2016-04-11: 1000 mL via INTRAVENOUS

## 2016-04-11 NOTE — ED Notes (Signed)
Pt states her pain is the same, despite sitting up in bed and texting.

## 2016-04-11 NOTE — ED Provider Notes (Signed)
CSN: [id number]     Arrival date & time 04/11/16  1721 History  By signing my name below, I, Arianna Nassar, attest that this documentation has been prepared under the direction and in the presence of Veryl Speak, MD. Electronically Signed: Julien Nordmann, ED Scribe. 04/11/2016. 6:12 PM.    Chief Complaint  Patient presents with  . Generalized Body Aches      The history is provided by the patient. No language interpreter was used.   HPI Comments: Heather Oconnell is a 40 y.o. female who has a PMHx of sickle cell anemia, seizures and CVA presents to the Emergency Department complaining of constant, gradual worsening, moderate, generalized body aches with associated chest pain upon exacerbation onset one week ago. She reports having generalized body aches in her bilateral extremities, back and neck.Pt was admitted to the hospital last week for a sickle cell pain crisis. Pt reports she had a blood transfusion done which caused her to have an allergic reaction. She has been admitted twice for sickle cell pain crisis in the past two months. Denies fever and shortness of breath.  Past Medical History  Diagnosis Date  . Sickle cell disease ([place])   . Seizures ([place])   . Stroke ([place])   . Blood clot due to device, implant, or graft    History reviewed. No pertinent past surgical history. No family history on file. Social History  Substance Use Topics  . Smoking status: Never Smoker   . Smokeless tobacco: Never Used  . Alcohol Use: No   OB History    No data available     Review of Systems  A complete 10 system review of systems was obtained and all systems are negative except as noted in the HPI and PMH.    Allergies  Demerol; Fentanyl; Influenza vaccines; Oxycontin; and Latex  Home Medications   Prior to Admission medications   Medication Sig Start Date End Date Taking? Authorizing Provider  cetirizine (ZYRTEC) 10 MG tablet Take 10 mg by mouth daily.     Historical Provider, MD   diphenhydrAMINE (BENADRYL) 25 MG tablet Take 1 tablet (25 mg total) by mouth every 6 (six) hours. 04/02/16   Jola Schmidt, MD  diphenhydramine-acetaminophen (TYLENOL PM) 25-500 MG TABS tablet Take 1 tablet by mouth every 6 (six) hours as needed (pain).    Historical Provider, MD  famotidine (PEPCID) 20 MG tablet Take 1 tablet (20 mg total) by mouth 2 (two) times daily. 04/02/16   Jola Schmidt, MD  folic acid (FOLVITE) 1 MG tablet Take 1 mg by mouth daily.    Historical Provider, MD  HYDROmorphone (DILAUDID) 4 MG tablet Take 1 tablet (4 mg total) by mouth every 4 (four) hours as needed for severe pain. 03/29/16   Leana Gamer, MD  hydroxyurea (HYDREA) 500 MG capsule Take 1,000 mg by mouth daily. May take with food to minimize GI side effects.    Historical Provider, MD  levETIRAcetam (KEPPRA) 500 MG tablet Take 1,000 mg by mouth every 12 (twelve) hours.    Historical Provider, MD  predniSONE (DELTASONE) 10 MG tablet Take 6 tablets (60 mg total) by mouth daily. 04/02/16   Jola Schmidt, MD  rivaroxaban (XARELTO) 20 MG TABS tablet Take 20 mg by mouth daily.    Historical Provider, MD   Triage vitals: BP 136/87 mmHg  Pulse 101  Temp(Src) 99 F (37.2 C) (Oral)  Resp 20  Ht 5\' 8"  (1.727 m)  Wt 146 lb (66.225 kg)  BMI  22.20 kg/m2  SpO2 100%  LMP 03/18/2016 (Approximate) Physical Exam  Constitutional: She is oriented to person, place, and time. She appears well-developed and well-nourished. No distress.  HENT:  Head: Normocephalic and atraumatic.  Eyes: EOM are normal.  Neck: Normal range of motion.  Cardiovascular: Normal rate, regular rhythm and normal heart sounds.   Pulmonary/Chest: Effort normal and breath sounds normal.  Abdominal: Soft. She exhibits no distension. There is no tenderness.  Musculoskeletal: Normal range of motion.  Neurological: She is alert and oriented to person, place, and time.  Skin: Skin is warm and dry.  Psychiatric: She has a normal mood and affect. Judgment  normal.  Nursing note and vitals reviewed.   ED Course  Procedures  DIAGNOSTIC STUDIES: Oxygen Saturation is 100% on RA, normal by my interpretation.  COORDINATION OF CARE:  6:11 PM Discussed treatment plan which include lab work and pain medication with pt at bedside and pt agreed to plan.  Labs Review Labs Reviewed - No data to display  Imaging Review No results found. I have personally reviewed and evaluated these images and lab results as part of my medical decision-making.   EKG Interpretation None      MDM   Final diagnoses:  None   Patient with history of sickle cell disease presents with complaints of "pain all over". She has been admitted twice since relocating here nearly one month ago. Her laboratory studies are consistent with baseline and I see no evidence for acute chest. She is feeling better after repeat doses of pain medication and IV fluids. She states that she feels better and is requesting to go home. She will be discharged, to follow-up with the sickle cell clinic if she has continued problems.  I personally performed the services described in this documentation, which was scribed in my presence. The recorded information has been reviewed and is accurate.       Veryl Speak, MD 04/11/16 2103

## 2016-04-11 NOTE — Discharge Instructions (Signed)
Sickle Cell Anemia, Adult Sickle cell anemia is a condition in which red blood cells have an abnormal "sickle" shape. This abnormal shape shortens the cells' life span, which results in a lower than normal concentration of red blood cells in the blood. The sickle shape also causes the cells to clump together and block free blood flow through the blood vessels. As a result, the tissues and organs of the body do not receive enough oxygen. Sickle cell anemia causes organ damage and pain and increases the risk of infection. CAUSES  Sickle cell anemia is a genetic disorder. Those who receive two copies of the gene have the condition, and those who receive one copy have the trait. RISK FACTORS The sickle cell gene is most common in people whose families originated in [place]. Other areas of the globe where sickle cell trait occurs include the Mediterranean, [place] and [place], the [place], and the [place].  SIGNS AND SYMPTOMS  Pain, especially in the extremities, back, chest, or abdomen (common). The pain may start suddenly or may develop following an illness, especially if there is dehydration. Pain can also occur due to overexertion or exposure to extreme temperature changes.  Frequent severe bacterial infections, especially certain types of pneumonia and meningitis.  Pain and swelling in the hands and feet.  Decreased activity.   Loss of appetite.   Change in behavior.  Headaches.  Seizures.  Shortness of breath or difficulty breathing.  Vision changes.  Skin ulcers. Those with the trait may not have symptoms or they may have mild symptoms.  DIAGNOSIS  Sickle cell anemia is diagnosed with blood tests that demonstrate the genetic trait. It is often diagnosed during the newborn period, due to mandatory testing nationwide. A variety of blood tests, X-rays, CT scans, MRI scans, ultrasounds, and lung function tests may also be done to monitor the condition. TREATMENT  Sickle  cell anemia may be treated with:  Medicines. You may be given pain medicines, antibiotic medicines (to treat and prevent infections) or medicines to increase the production of certain types of hemoglobin.  Fluids.  Oxygen.  Blood transfusions. HOME CARE INSTRUCTIONS   Drink enough fluid to keep your urine clear or pale yellow. Increase your fluid intake in hot weather and during exercise.  Do not smoke. Smoking lowers oxygen levels in the blood.   Only take over-the-counter or prescription medicines for pain, fever, or discomfort as directed by your health care provider.  Take antibiotics as directed by your health care provider. Make sure you finish them it even if you start to feel better.   Take supplements as directed by your health care provider.   Consider wearing a medical alert bracelet. This tells anyone caring for you in an emergency of your condition.   When traveling, keep your medical information, health care provider's names, and the medicines you take with you at all times.   If you develop a fever, do not take medicines to reduce the fever right away. This could cover up a problem that is developing. Notify your health care provider.  Keep all follow-up appointments with your health care provider. Sickle cell anemia requires regular medical care. SEEK MEDICAL CARE IF: You have a fever. SEEK IMMEDIATE MEDICAL CARE IF:   You feel dizzy or faint.   You have new abdominal pain, especially on the left side near the stomach area.   You develop a persistent, often uncomfortable and painful penile erection (priapism). If this is not treated immediately it   will lead to impotence.   You have numbness your arms or legs or you have a hard time moving them.   You have a hard time with speech.   You have a fever or persistent symptoms for more than 2-3 days.   You have a fever and your symptoms suddenly get worse.   You have signs or symptoms of infection.  These include:   Chills.   Abnormal tiredness (lethargy).   Irritability.   Poor eating.   Vomiting.   You develop pain that is not helped with medicine.   You develop shortness of breath.  You have pain in your chest.   You are coughing up pus-like or bloody sputum.   You develop a stiff neck.  Your feet or hands swell or have pain.  Your abdomen appears bloated.  You develop joint pain. MAKE SURE YOU:  Understand these instructions.   This information is not intended to replace advice given to you by your health care provider. Make sure you discuss any questions you have with your health care provider.   Document Released: 02/28/2006 Document Revised: 12/11/2014 Document Reviewed: 07/02/2013 Elsevier Interactive Patient Education 2016 Elsevier Inc.  

## 2016-04-11 NOTE — ED Notes (Addendum)
Pt called out saying IV site was painful and swollen. On assessment round, cool and indurated area noted proximal to IV site, appearing infiltrated. Pt states it happened after bending arm to sign forms for registration. IV removed and heat pack applied. IV reinitiated in L AC with [place].

## 2016-04-11 NOTE — ED Notes (Signed)
Pt verbalizes understanding of d/c instructions and denies any further needs at this time. 

## 2016-04-11 NOTE — ED Notes (Signed)
Body aches since having a blood transfusion a week ago.

## 2016-04-11 NOTE — ED Notes (Signed)
Pt states she thinks she can go home.  Still sitting up and on phone, no acute distress noted.

## 2016-04-14 ENCOUNTER — Encounter: Payer: Self-pay | Admitting: Family Medicine

## 2016-04-14 ENCOUNTER — Ambulatory Visit (INDEPENDENT_AMBULATORY_CARE_PROVIDER_SITE_OTHER): Payer: Medicare Other | Admitting: Family Medicine

## 2016-04-14 VITALS — BP 119/79 | HR 112 | Temp 98.8°F | Resp 16 | Ht 68.0 in | Wt 151.0 lb

## 2016-04-14 DIAGNOSIS — D571 Sickle-cell disease without crisis: Secondary | ICD-10-CM

## 2016-04-14 DIAGNOSIS — Z7901 Long term (current) use of anticoagulants: Secondary | ICD-10-CM | POA: Diagnosis not present

## 2016-04-14 DIAGNOSIS — E559 Vitamin D deficiency, unspecified: Secondary | ICD-10-CM | POA: Diagnosis not present

## 2016-04-14 LAB — CBC WITH DIFFERENTIAL/PLATELET
BASOS PCT: 0 %
Basophils Absolute: 0 cells/uL (ref 0–200)
EOS ABS: 132 {cells}/uL (ref 15–500)
Eosinophils Relative: 2 %
HEMATOCRIT: 34.4 % — AB (ref 35.0–45.0)
Hemoglobin: 11.8 g/dL (ref 11.7–15.5)
Lymphocytes Relative: 26 %
Lymphs Abs: 1716 cells/uL (ref 850–3900)
MCH: 38.1 pg — ABNORMAL HIGH (ref 27.0–33.0)
MCHC: 34.3 g/dL (ref 32.0–36.0)
MCV: 111 fL — AB (ref 80.0–100.0)
MONO ABS: 726 {cells}/uL (ref 200–950)
MONOS PCT: 11 %
MPV: 9.7 fL (ref 7.5–12.5)
NEUTROS ABS: 4026 {cells}/uL (ref 1500–7800)
Neutrophils Relative %: 61 %
PLATELETS: 612 10*3/uL — AB (ref 140–400)
RBC: 3.1 MIL/uL — AB (ref 3.80–5.10)
RDW: 20.8 % — ABNORMAL HIGH (ref 11.0–15.0)
WBC: 6.6 10*3/uL (ref 3.8–10.8)

## 2016-04-14 LAB — COMPLETE METABOLIC PANEL WITH GFR
ALT: 15 U/L (ref 6–29)
AST: 26 U/L (ref 10–30)
Albumin: 4.4 g/dL (ref 3.6–5.1)
Alkaline Phosphatase: 83 U/L (ref 33–115)
BUN: 7 mg/dL (ref 7–25)
CHLORIDE: 102 mmol/L (ref 98–110)
CO2: 22 mmol/L (ref 20–31)
Calcium: 9.1 mg/dL (ref 8.6–10.2)
Creat: 0.67 mg/dL (ref 0.50–1.10)
GFR, Est African American: >89 mL/min (ref >=60)
GLUCOSE: 101 mg/dL — AB (ref 65–99)
POTASSIUM: 5.2 mmol/L (ref 3.5–5.3)
SODIUM: 134 mmol/L — AB (ref 135–146)
Total Bilirubin: 2 mg/dL — ABNORMAL HIGH (ref 0.2–1.2)
Total Protein: 7.9 g/dL (ref 6.1–8.1)

## 2016-04-14 LAB — POCT URINALYSIS DIP (DEVICE)
Bilirubin Urine: NEGATIVE
Glucose, UA: NEGATIVE mg/dL
Hgb urine dipstick: NEGATIVE
Ketones, ur: NEGATIVE mg/dL
LEUKOCYTES UA: NEGATIVE
NITRITE: NEGATIVE
PROTEIN: 30 mg/dL — AB
SPECIFIC GRAVITY, URINE: 1.015 (ref 1.005–1.030)
UROBILINOGEN UA: 1 mg/dL (ref 0.0–1.0)
pH: 8.5 — ABNORMAL HIGH (ref 5.0–8.0)

## 2016-04-14 LAB — FERRITIN: Ferritin: 959 ng/mL — ABNORMAL HIGH (ref 10–232)

## 2016-04-14 MED ORDER — HYDROMORPHONE HCL 4 MG PO TABS: 4.0000 mg | ORAL_TABLET | ORAL | Status: AC | PRN

## 2016-04-14 NOTE — Patient Instructions (Signed)
Sickle Cell Anemia, Adult Sickle cell anemia is a condition in which red blood cells have an abnormal "sickle" shape. This abnormal shape shortens the cells' life span, which results in a lower than normal concentration of red blood cells in the blood. The sickle shape also causes the cells to clump together and block free blood flow through the blood vessels. As a result, the tissues and organs of the body do not receive enough oxygen. Sickle cell anemia causes organ damage and pain and increases the risk of infection. CAUSES  Sickle cell anemia is a genetic disorder. Those who receive two copies of the gene have the condition, and those who receive one copy have the trait. RISK FACTORS The sickle cell gene is most common in people whose families originated in [place]. Other areas of the globe where sickle cell trait occurs include the Mediterranean, [place] and [place], the [place], and the [place].  SIGNS AND SYMPTOMS  Pain, especially in the extremities, back, chest, or abdomen (common). The pain may start suddenly or may develop following an illness, especially if there is dehydration. Pain can also occur due to overexertion or exposure to extreme temperature changes.  Frequent severe bacterial infections, especially certain types of pneumonia and meningitis.  Pain and swelling in the hands and feet.  Decreased activity.   Loss of appetite.   Change in behavior.  Headaches.  Seizures.  Shortness of breath or difficulty breathing.  Vision changes.  Skin ulcers. Those with the trait may not have symptoms or they may have mild symptoms.  DIAGNOSIS  Sickle cell anemia is diagnosed with blood tests that demonstrate the genetic trait. It is often diagnosed during the newborn period, due to mandatory testing nationwide. A variety of blood tests, X-rays, CT scans, MRI scans, ultrasounds, and lung function tests may also be done to monitor the condition. TREATMENT  Sickle  cell anemia may be treated with:  Medicines. You may be given pain medicines, antibiotic medicines (to treat and prevent infections) or medicines to increase the production of certain types of hemoglobin.  Fluids.  Oxygen.  Blood transfusions. HOME CARE INSTRUCTIONS   Drink enough fluid to keep your urine clear or pale yellow. Increase your fluid intake in hot weather and during exercise.  Do not smoke. Smoking lowers oxygen levels in the blood.   Only take over-the-counter or prescription medicines for pain, fever, or discomfort as directed by your health care provider.  Take antibiotics as directed by your health care provider. Make sure you finish them it even if you start to feel better.   Take supplements as directed by your health care provider.   Consider wearing a medical alert bracelet. This tells anyone caring for you in an emergency of your condition.   When traveling, keep your medical information, health care provider's names, and the medicines you take with you at all times.   If you develop a fever, do not take medicines to reduce the fever right away. This could cover up a problem that is developing. Notify your health care provider.  Keep all follow-up appointments with your health care provider. Sickle cell anemia requires regular medical care. SEEK MEDICAL CARE IF: You have a fever. SEEK IMMEDIATE MEDICAL CARE IF:   You feel dizzy or faint.   You have new abdominal pain, especially on the left side near the stomach area.   You develop a persistent, often uncomfortable and painful penile erection (priapism). If this is not treated immediately it   will lead to impotence.   You have numbness your arms or legs or you have a hard time moving them.   You have a hard time with speech.   You have a fever or persistent symptoms for more than 2-3 days.   You have a fever and your symptoms suddenly get worse.   You have signs or symptoms of infection.  These include:   Chills.   Abnormal tiredness (lethargy).   Irritability.   Poor eating.   Vomiting.   You develop pain that is not helped with medicine.   You develop shortness of breath.  You have pain in your chest.   You are coughing up pus-like or bloody sputum.   You develop a stiff neck.  Your feet or hands swell or have pain.  Your abdomen appears bloated.  You develop joint pain. MAKE SURE YOU:  Understand these instructions.   This information is not intended to replace advice given to you by your health care provider. Make sure you discuss any questions you have with your health care provider.   Document Released: 02/28/2006 Document Revised: 12/11/2014 Document Reviewed: 07/02/2013 Elsevier Interactive Patient Education 2016 [employer] Inc.  

## 2016-04-14 NOTE — Progress Notes (Signed)
Subjective:    Patient ID: Heather Oconnell, female    DOB: [birth year], 40 y.o.   MRN: [id number]  HPI   Heather Oconnell, a 40 year old female with a history of sickle cell anemia, HbSS presents to establish care. Patient recently relocated to area from [place], [place] and was seen by [facility] for sickle cell anemia. Patient reports that she has been hospitalized several times over the past several months due to sickle cell crisis. She attributes crises to stress and changes in weather. She says that her current pain intensity is 5/10 primarily to lower extremities. She typically takes Dilaudid 4 mg every 4 hours for pain. She has been out of medication for greater than 1 week. She denies headache, fatigue, chest pains, dysuria, constipation, nausea, vomiting, or diarrhea.   Past Medical History  Diagnosis Date  . Sickle cell disease ([place])   . Seizures ([place])   . Stroke ([place])   . Blood clot due to device, implant, or graft    Immunization History  Administered Date(s) Administered  . Pneumococcal Polysaccharide-23 02/11/2016  . Tdap 01/16/2016   Social History   Social History  . Marital Status: Single    Spouse Name: N/A  . Number of Children: N/A  . Years of Education: N/A   Occupational History  . Not on file.   Social History Main Topics  . Smoking status: Never Smoker   . Smokeless tobacco: Never Used  . Alcohol Use: No  . Drug Use: No  . Sexual Activity: No   Other Topics Concern  . Not on file   Social History Narrative   Past Surgical History  Procedure Laterality Date  . Cesarean section      exploritiory after c-section  . Cholecystectomy     Allergies  Allergen Reactions  . Demerol [Meperidine] Other (See Comments)    Causes seizures  . Fentanyl Other (See Comments)    Stoke symptoms with Duragesic patch  . Influenza Vaccines Other (See Comments)    unknown  . Morphine And Related Hives and Itching  . Oxycontin [Oxycodone  Hcl] Other (See Comments)    unknown  . Latex Rash   Review of Systems  Constitutional: Negative.  Negative for fatigue.  HENT: Negative.   Eyes: Negative.   Respiratory: Negative.   Cardiovascular: Negative.   Gastrointestinal: Negative.   Endocrine: Negative.   Genitourinary: Negative.   Musculoskeletal: Positive for myalgias (lower extremity pain).  Allergic/Immunologic: Negative.   Neurological: Negative.   Hematological: Negative.   Psychiatric/Behavioral: Negative.        Objective:   Physical Exam  Constitutional: She is oriented to person, place, and time. She appears well-developed and well-nourished.  HENT:  Head: Normocephalic and atraumatic.  Right Ear: External ear normal.  Left Ear: External ear normal.  Nose: Nose normal.  Mouth/Throat: Oropharynx is clear and moist.  Eyes: Conjunctivae and EOM are normal. Pupils are equal, round, and reactive to light.  Neck: Normal range of motion. Neck supple.  Cardiovascular: Normal rate, regular rhythm, normal heart sounds and intact distal pulses.   Pulmonary/Chest: Effort normal and breath sounds normal.  Abdominal: Soft. Bowel sounds are normal. There is no tenderness.  Musculoskeletal: Normal range of motion.  Neurological: She is alert and oriented to person, place, and time. She has normal reflexes.  Skin: Skin is warm and dry.  Psychiatric: She has a normal mood and affect. Her behavior is normal. Judgment and thought content normal.  BP 119/79 mmHg  Pulse 112  Temp(Src) 98.8 F (37.1 C) (Oral)  Resp 16  Ht 5\' 8"  (1.727 m)  Wt 151 lb (68.493 kg)  BMI 22.96 kg/m2  SpO2 100%  LMP 03/18/2016 (Approximate) Assessment & Plan:  1. Sickle cell disease, type SS (HCC) Sickle cell disease - Continue Hydrea 1000 mg daily. We discussed the need for good hydration, monitoring of hydration status, avoidance of heat, cold, stress, and infection triggers. We discussed the risks and benefits of Hydrea, including bone  marrow suppression, the possibility of GI upset, skin ulcers, hair thinning, and teratogenicity. The patient was reminded of the need to seek medical attention of any symptoms of bleeding, anemia, or infection. Continue folic acid 1 mg daily to prevent aplastic bone marrow crises.   Pulmonary evaluation - Patient denies severe recurrent wheezes, shortness of breath with exercise, or persistent cough. If these symptoms develop, pulmonary function tests with spirometry will be ordered, and if abnormal, plan on referral to Pulmonology for further evaluation.  Cardiac - Routine screening for pulmonary hypertension is not recommended.  Eye - High risk of proliferative retinopathy. Annual eye exam with retinal exam recommended to patient.  Immunization status - She is up to date with vaccinations  Acute and chronic painful episodes - We agreed on Hydromorphone 4 mg # 60 plan on titrating her Medication dose. We discussed that pt is to receive her Schedule II prescriptions only from [place]. Pt is also aware that the prescription history is available to [place] online through the [facility] CSRS. Controlled substance agreement signed 04/14/2016. We reminded Heather Oconnell that all patients receiving Schedule II narcotics must be seen for follow within one month of prescription being requested. We reviewed the terms of our pain agreement, including the need to keep medicines in a safe locked location away from children or pets, and the need to report excess sedation or constipation, measures to avoid constipation, and policies related to early refills and stolen prescriptions. According to the [place] Chronic Pain Initiative program, we have reviewed details related to analgesia, adverse effects, aberrant behaviors. Reviewed [place] Substance Reporting system prior to prescribing opiate medications, no inconsistencies noted.    Iron overload from chronic transfusion.  Will check ferritin level due to transfusion history  The above  recommendations are taken from the NIH Evidence-Based Management of Sickle Cell Disease: Expert Panel Report, 20149.  - CBC with Differential - COMPLETE METABOLIC PANEL WITH GFR - Ferritin - POCT urinalysis dipstick - Hemoglobinopathy evaluation - HYDROmorphone (DILAUDID) 4 MG tablet; Take 1 tablet (4 mg total) by mouth every 4 (four) hours as needed for severe pain.  Dispense: 60 tablet; Refill: 0 - POCT urinalysis dip (device) - Ambulatory referral to Ophthalmology  2. Vitamin D deficiency  - Vitamin D, 25-hydroxy  3. 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    Current Meds  Medication Sig  . cetirizine (ZYRTEC) 10 MG tablet Take 10 mg by mouth daily.   . diphenhydrAMINE (BENADRYL) 25 MG tablet Take 1 tablet (25 mg total) by mouth every 6 (six) hours.  . diphenhydramine-acetaminophen (TYLENOL PM) 25-500 MG TABS tablet Take 1 tablet by mouth every 6 (six) hours as needed (pain).  . famotidine (PEPCID) 20 MG tablet Take 1 tablet (20 mg total) by mouth 2 (two) times daily.  . folic acid (FOLVITE) 1 MG tablet Take 1 mg by mouth daily.  Marland Kitchen HYDROmorphone (DILAUDID) 4 MG tablet Take 1 tablet (4  mg total) by mouth every 4 (four) hours as needed for severe pain.  . hydroxyurea (HYDREA) 500 MG capsule Take 1,000 mg by mouth daily. May take with food to minimize GI side effects.  . levETIRAcetam (KEPPRA) 500 MG tablet Take 1,000 mg by mouth every 12 (twelve) hours.  . rivaroxaban (XARELTO) 20 MG TABS tablet Take 20 mg by mouth daily.  . [DISCONTINUED] HYDROmorphone (DILAUDID) 4 MG tablet Take 1 tablet (4 mg total) by mouth every 4 (four) hours as needed for severe pain.      Tiwan Schnitker M, FNP  The patient was given clear instructions to go to ER or return to medical center if symptoms do not improve, worsen or new problems develop. The patient verbalized understanding. Will notify patient with laboratory results.

## 2016-04-15 LAB — VITAMIN D 25 HYDROXY (VIT D DEFICIENCY, FRACTURES): Vit D, 25-Hydroxy: 5 ng/mL — ABNORMAL LOW (ref 30–100)

## 2016-04-18 LAB — HEMOGLOBINOPATHY EVALUATION
HEMOGLOBIN OTHER: 0 %
HGB A2 QUANT: 3 % (ref 2.2–3.2)
HGB A: 7.6 % — AB (ref 96.8–97.8)
HGB S QUANTITAION: 74.6 % — AB
Hgb F Quant: 14.8 % — ABNORMAL HIGH (ref 0.0–2.0)

## 2016-04-19 ENCOUNTER — Other Ambulatory Visit: Payer: Self-pay | Admitting: Family Medicine

## 2016-04-19 DIAGNOSIS — E559 Vitamin D deficiency, unspecified: Secondary | ICD-10-CM | POA: Insufficient documentation

## 2016-04-19 DIAGNOSIS — D571 Sickle-cell disease without crisis: Secondary | ICD-10-CM

## 2016-04-19 MED ORDER — ERGOCALCIFEROL 1.25 MG (50000 UT) PO CAPS: 50000.0000 [IU] | ORAL_CAPSULE | ORAL | Status: DC

## 2016-04-19 NOTE — Progress Notes (Signed)
Tried to call, no answer. Will try later. Thanks!

## 2016-04-20 NOTE — Progress Notes (Signed)
Called, no answer. Left message for patient to return call. Thanks!  

## 2016-04-21 NOTE — Progress Notes (Signed)
Tried to contact patient multiple times, no answer. Will mail letter. Thanks!

## 2016-05-19 ENCOUNTER — Ambulatory Visit: Payer: Medicare Other | Admitting: Family Medicine

## 2016-06-04 IMAGING — CR DG CHEST 2V
2 series · 2 of 2 positions shown · non-contrast
Comparison: 02/10/2016

CLINICAL DATA: Upper back pain.  Sickle cell crisis.

EXAM:
CHEST  2 VIEW

[w chest pa]
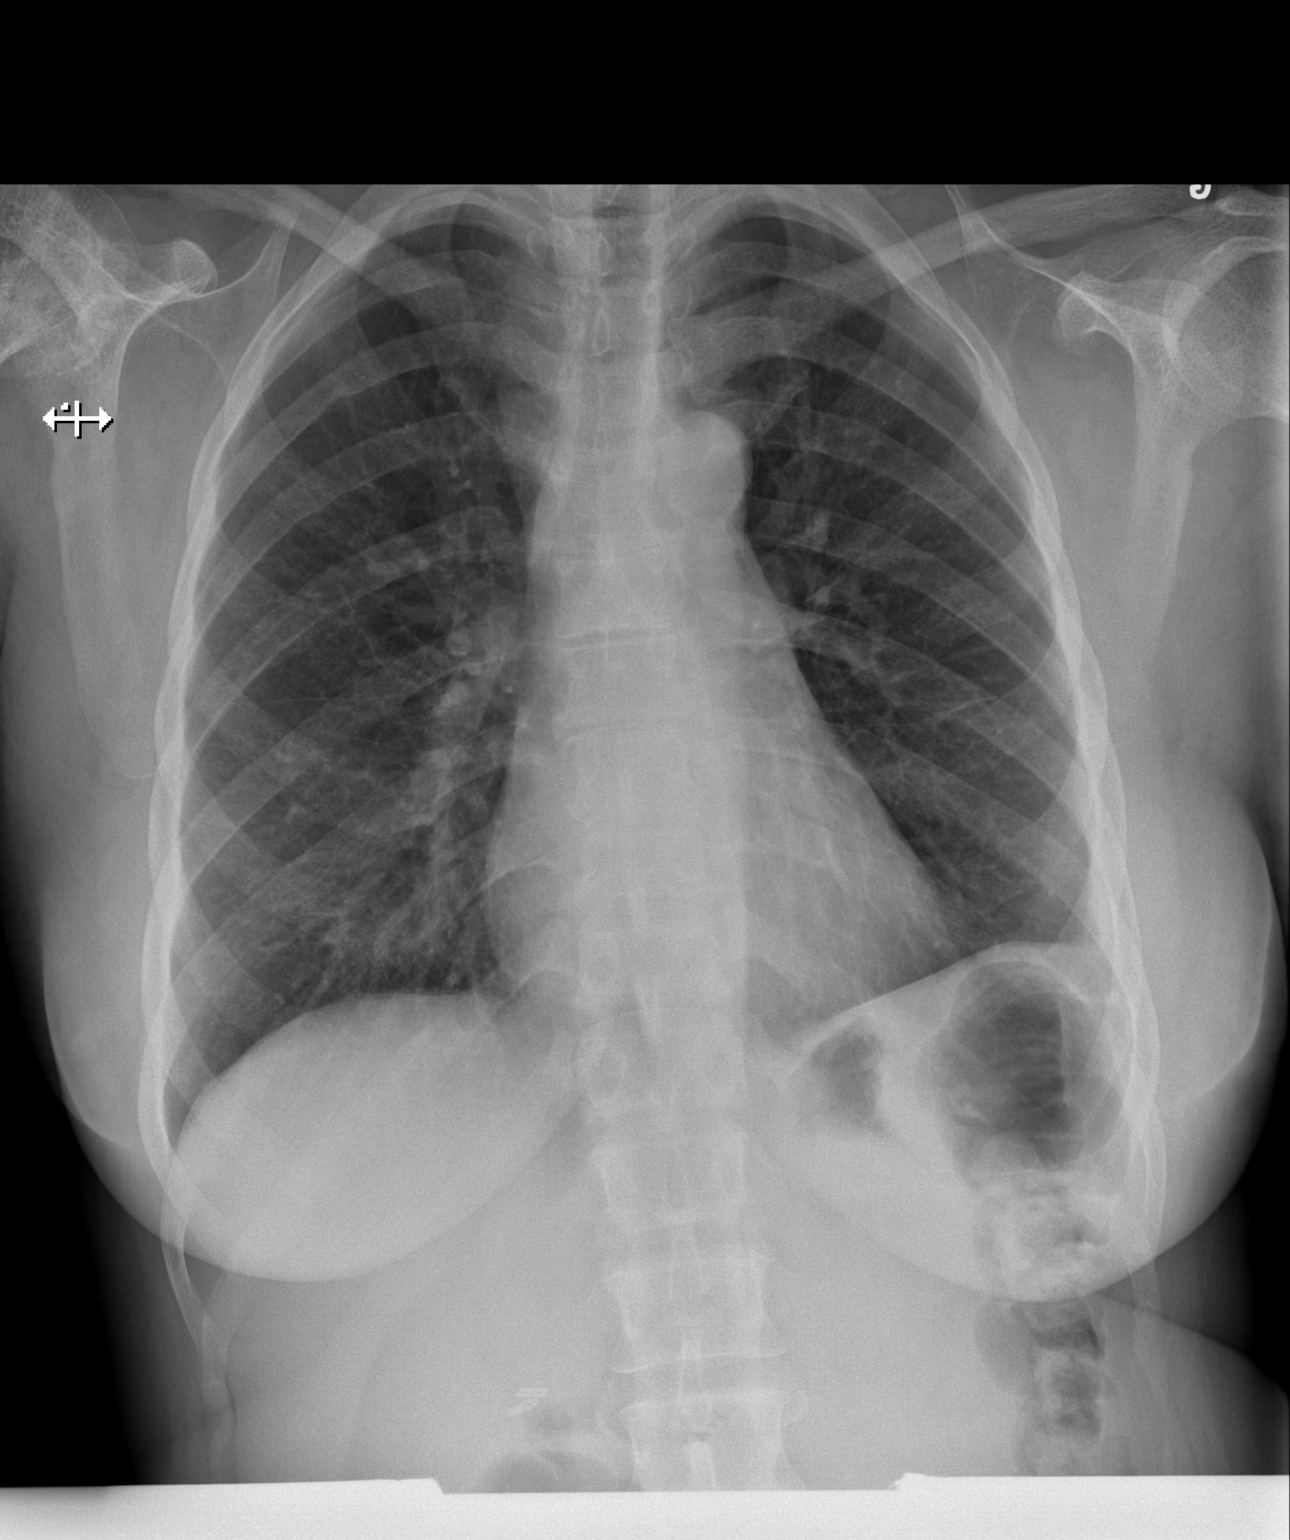

[w chest lat]
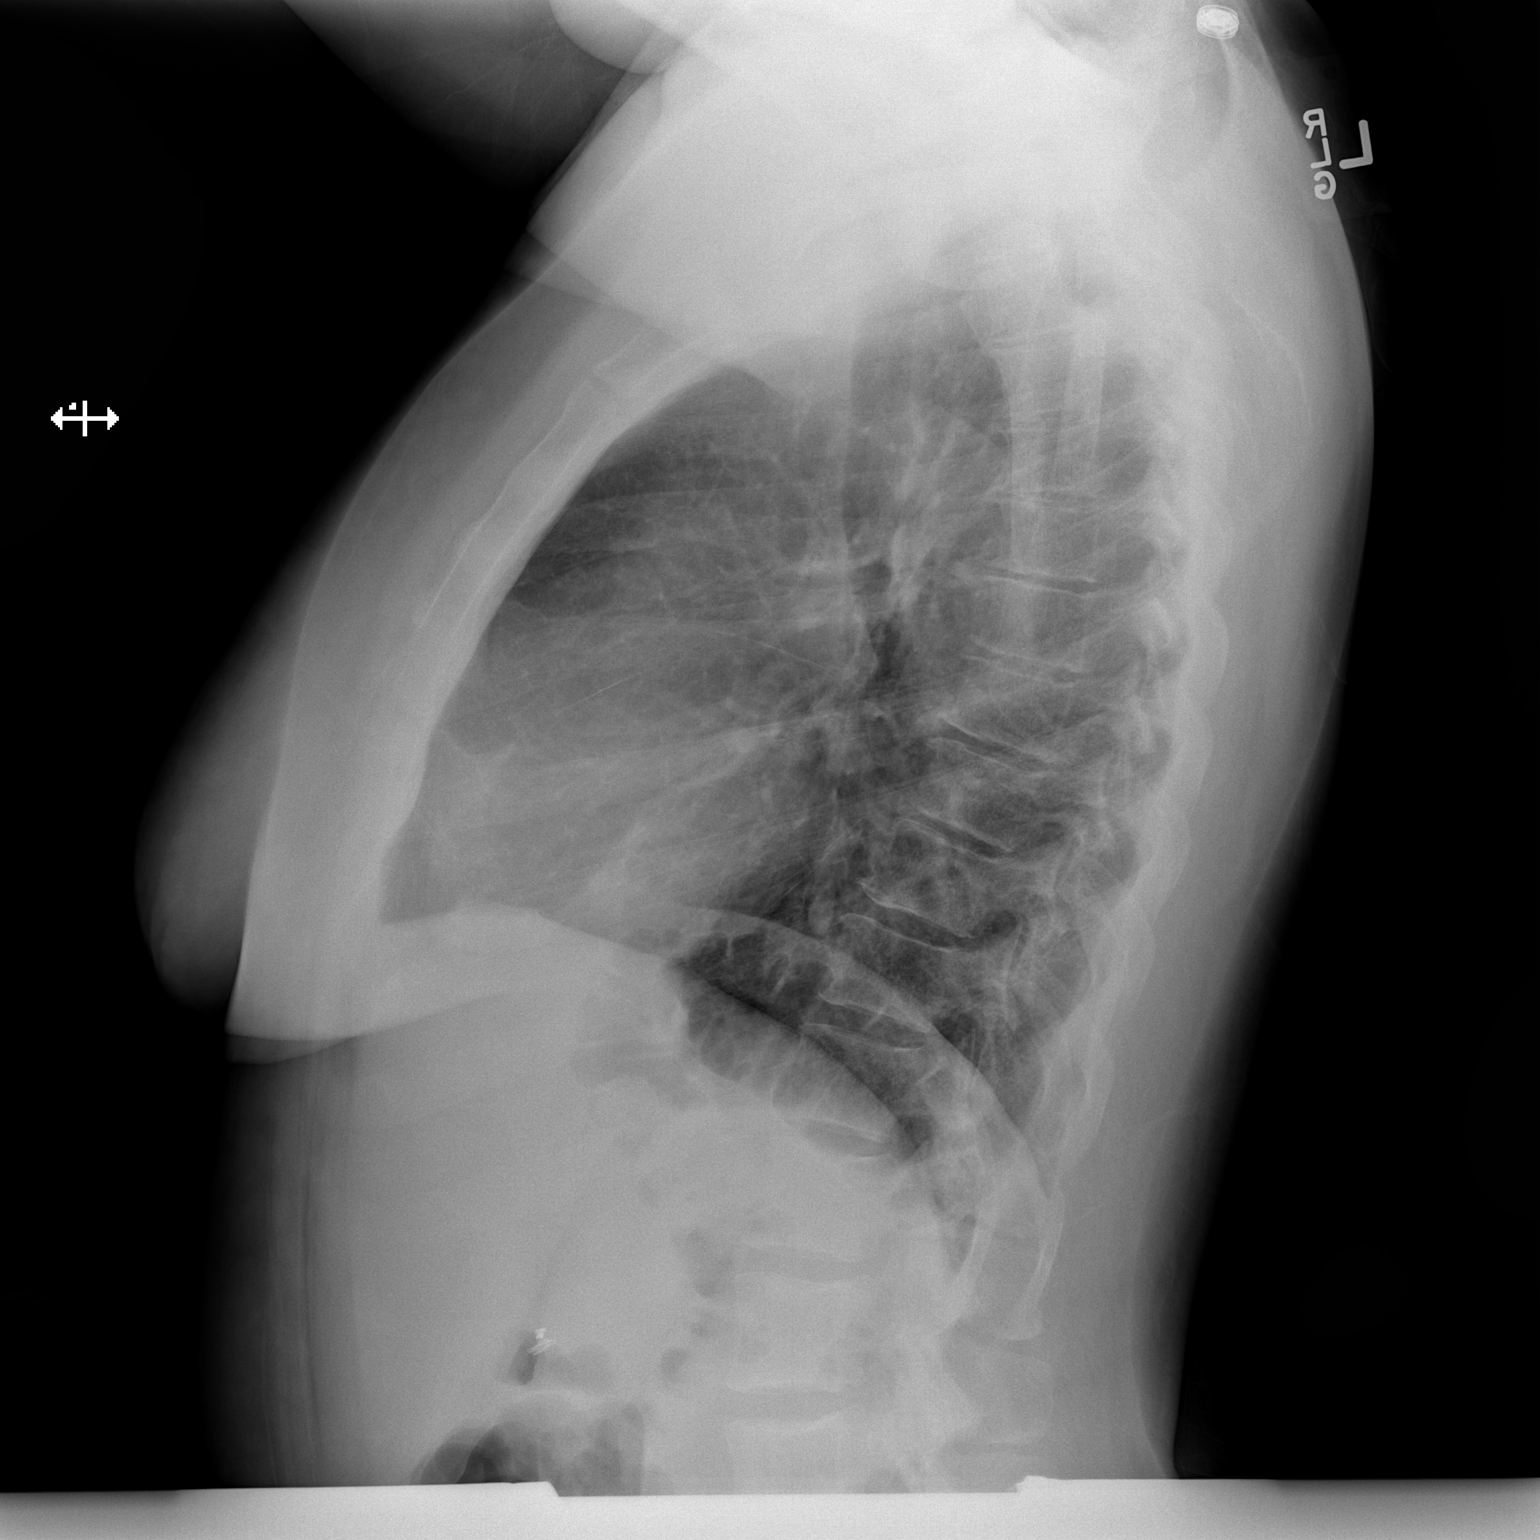

[2 of 2 positions shown; findings below may reference images not displayed]

FINDINGS: The heart size and pulmonary vascularity are normal and the lungs
are clear. Chronic blunting of the left costophrenic angle. No acute
bone abnormality. Chronic sclerosis at the right humeral head.
IMPRESSION: No acute abnormalities.

## 2016-06-09 IMAGING — CR DG CHEST 2V
2 series · 2 of 2 positions shown · non-contrast
Comparison: 03/20/2016

CLINICAL DATA: Hypoxia.  History of sickle cell disease.

EXAM:
CHEST  2 VIEW

[w chest pa]
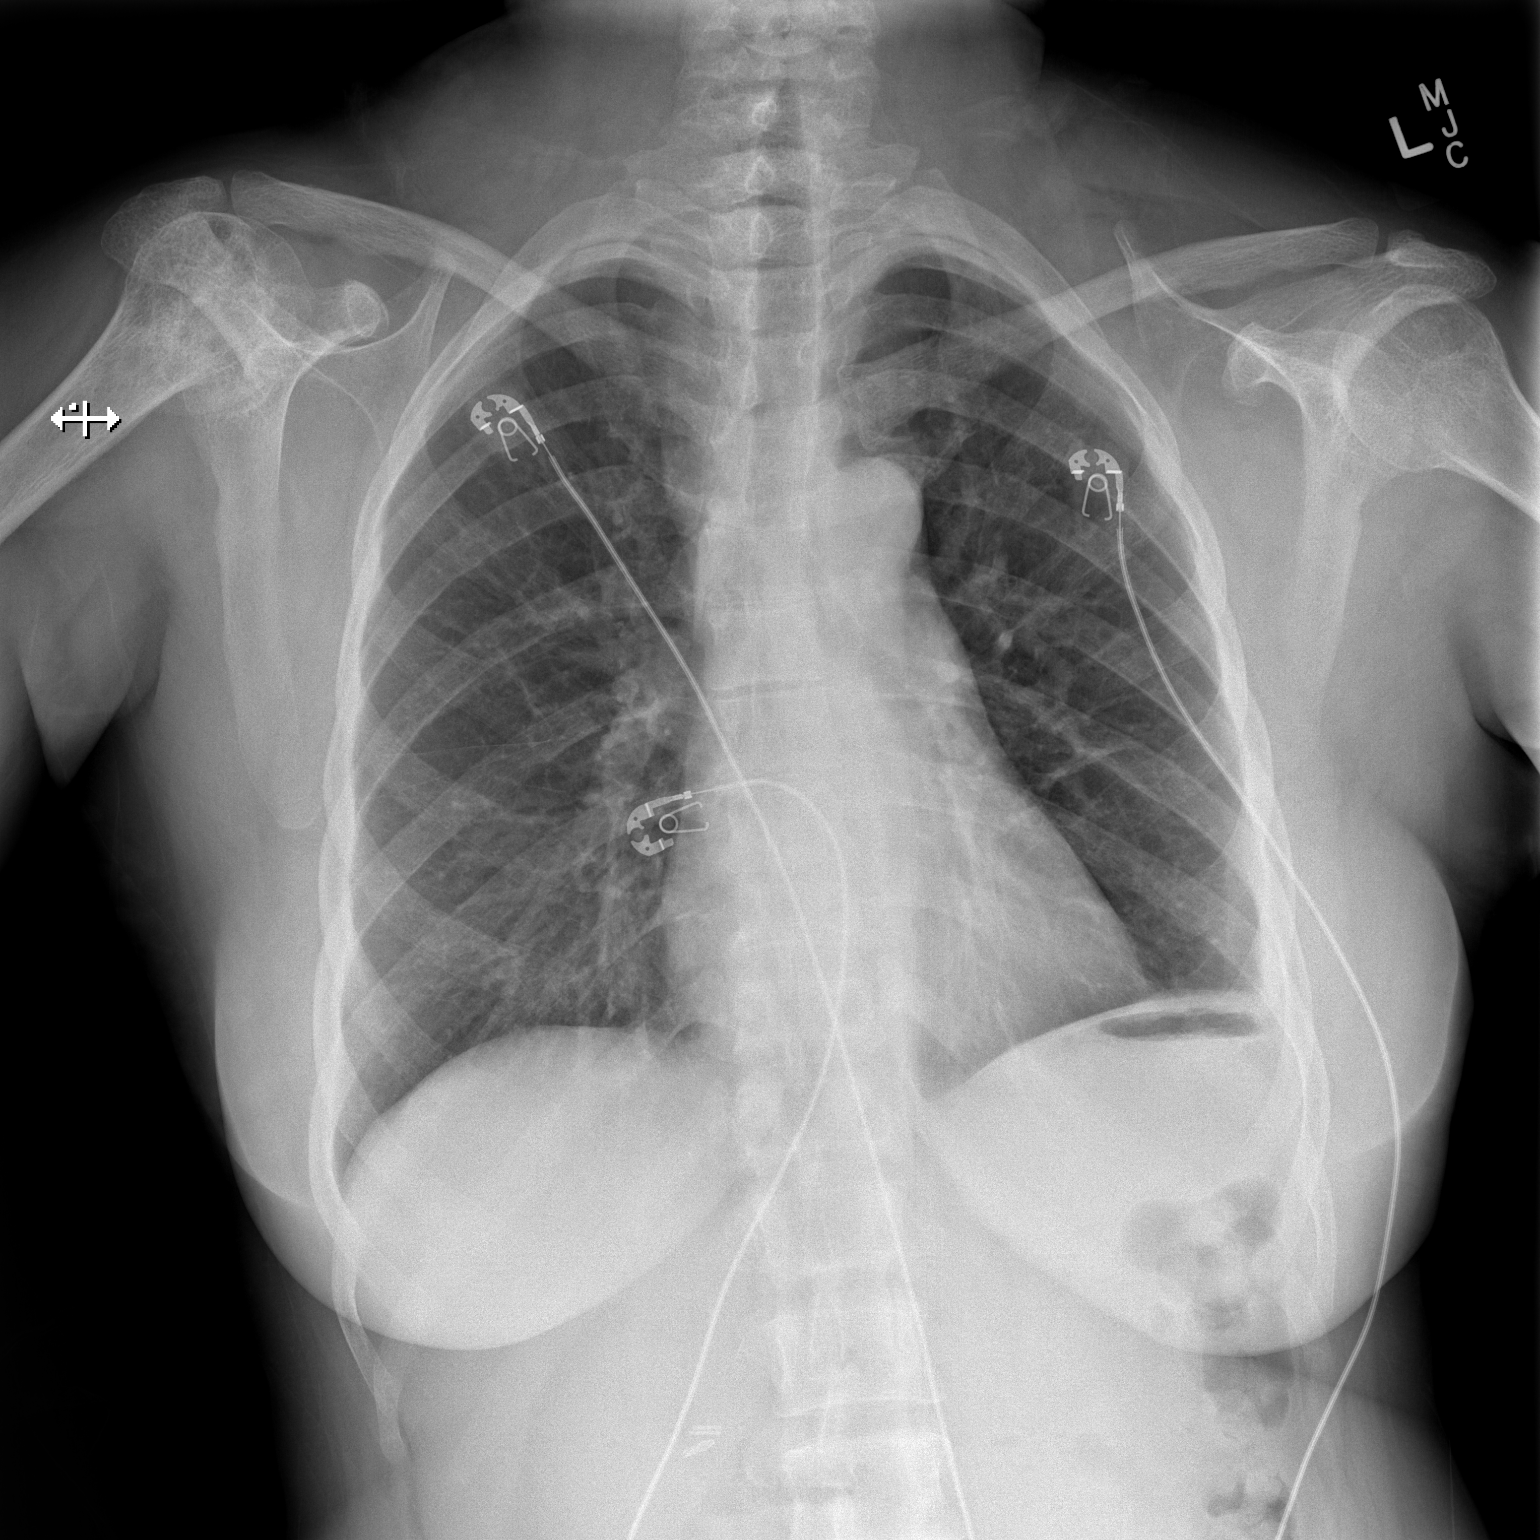

[w chest lat]
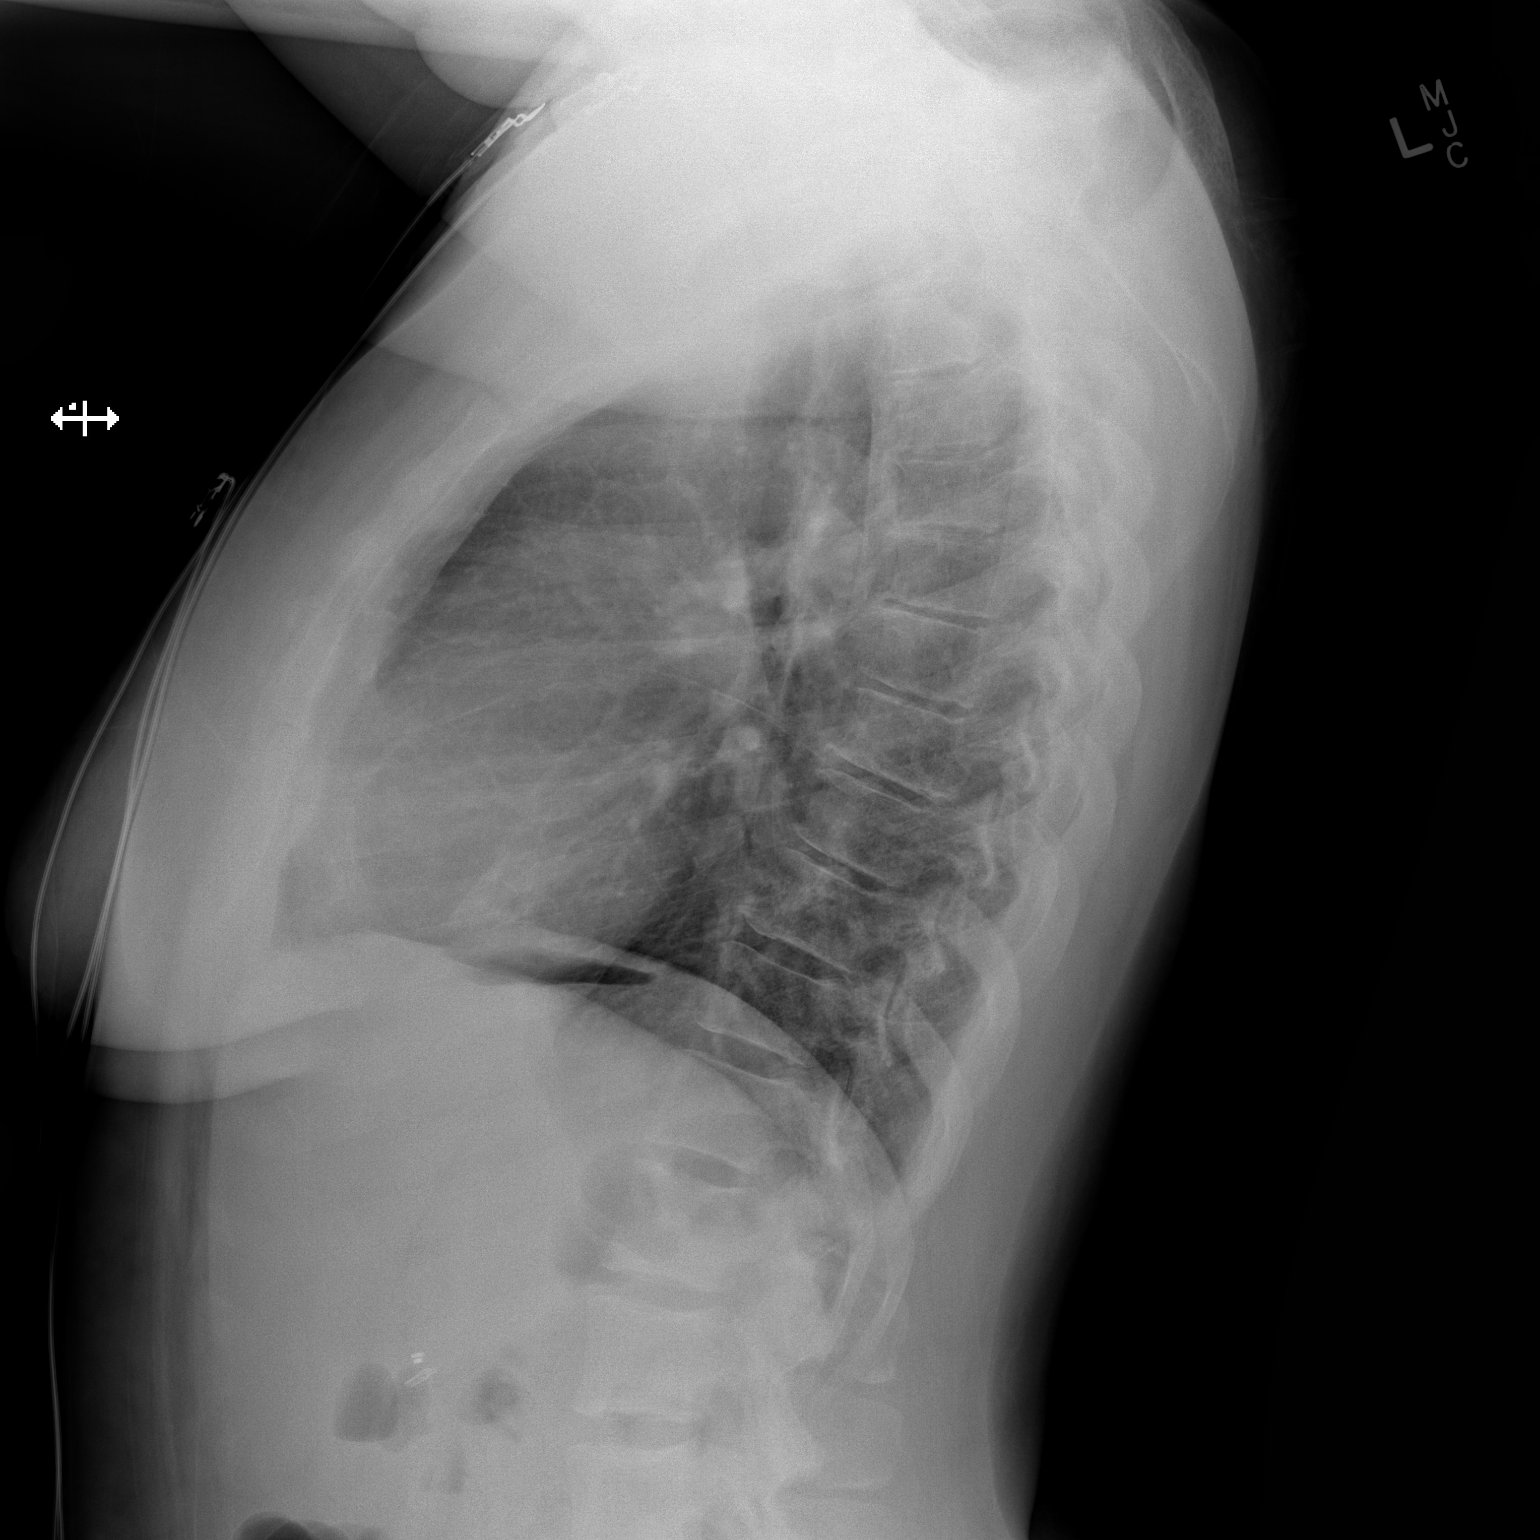

[2 of 2 positions shown; findings below may reference images not displayed]

FINDINGS: Normal heart size and pulmonary vascularity. No focal airspace
disease or consolidation in the lungs. No blunting of costophrenic
angles. No pneumothorax. Mediastinal contours appear intact.
Sclerotic bone changes in the spine and right humeral head
consistent with bony changes of sickle cell.
IMPRESSION: No evidence of active pulmonary disease.  Sickle cell bony changes.

## 2016-06-19 DIAGNOSIS — R109 Unspecified abdominal pain: Secondary | ICD-10-CM | POA: Diagnosis not present

## 2016-06-19 DIAGNOSIS — Z86711 Personal history of pulmonary embolism: Secondary | ICD-10-CM | POA: Diagnosis not present

## 2016-06-19 DIAGNOSIS — Z8673 Personal history of transient ischemic attack (TIA), and cerebral infarction without residual deficits: Secondary | ICD-10-CM | POA: Diagnosis not present

## 2016-06-19 DIAGNOSIS — D5700 Hb-SS disease with crisis, unspecified: Secondary | ICD-10-CM | POA: Diagnosis not present

## 2016-06-19 DIAGNOSIS — Z86718 Personal history of other venous thrombosis and embolism: Secondary | ICD-10-CM | POA: Diagnosis not present

## 2016-06-19 DIAGNOSIS — R1031 Right lower quadrant pain: Secondary | ICD-10-CM | POA: Diagnosis not present

## 2016-06-19 DIAGNOSIS — R569 Unspecified convulsions: Secondary | ICD-10-CM | POA: Diagnosis not present

## 2016-06-19 DIAGNOSIS — Z7901 Long term (current) use of anticoagulants: Secondary | ICD-10-CM | POA: Diagnosis not present

## 2016-06-19 DIAGNOSIS — M5489 Other dorsalgia: Secondary | ICD-10-CM | POA: Diagnosis not present

## 2016-06-19 DIAGNOSIS — R0789 Other chest pain: Secondary | ICD-10-CM | POA: Diagnosis not present

## 2016-06-19 DIAGNOSIS — E860 Dehydration: Secondary | ICD-10-CM | POA: Diagnosis not present

## 2016-06-20 DIAGNOSIS — D5700 Hb-SS disease with crisis, unspecified: Secondary | ICD-10-CM | POA: Diagnosis not present

## 2016-07-19 DIAGNOSIS — Z7901 Long term (current) use of anticoagulants: Secondary | ICD-10-CM | POA: Diagnosis not present

## 2016-07-19 DIAGNOSIS — Z885 Allergy status to narcotic agent status: Secondary | ICD-10-CM | POA: Diagnosis not present

## 2016-07-19 DIAGNOSIS — R569 Unspecified convulsions: Secondary | ICD-10-CM | POA: Diagnosis not present

## 2016-07-19 DIAGNOSIS — G40909 Epilepsy, unspecified, not intractable, without status epilepticus: Secondary | ICD-10-CM | POA: Diagnosis not present

## 2016-07-19 DIAGNOSIS — G8929 Other chronic pain: Secondary | ICD-10-CM | POA: Diagnosis not present

## 2016-07-19 DIAGNOSIS — Z5181 Encounter for therapeutic drug level monitoring: Secondary | ICD-10-CM | POA: Diagnosis not present

## 2016-07-19 DIAGNOSIS — I8290 Acute embolism and thrombosis of unspecified vein: Secondary | ICD-10-CM | POA: Diagnosis not present

## 2016-07-19 DIAGNOSIS — Z9049 Acquired absence of other specified parts of digestive tract: Secondary | ICD-10-CM | POA: Diagnosis not present

## 2016-07-19 DIAGNOSIS — E559 Vitamin D deficiency, unspecified: Secondary | ICD-10-CM | POA: Diagnosis not present

## 2016-07-19 DIAGNOSIS — F419 Anxiety disorder, unspecified: Secondary | ICD-10-CM | POA: Diagnosis not present

## 2016-07-19 DIAGNOSIS — F329 Major depressive disorder, single episode, unspecified: Secondary | ICD-10-CM | POA: Diagnosis not present

## 2016-07-19 DIAGNOSIS — Z86711 Personal history of pulmonary embolism: Secondary | ICD-10-CM | POA: Diagnosis not present

## 2016-07-19 DIAGNOSIS — D571 Sickle-cell disease without crisis: Secondary | ICD-10-CM | POA: Diagnosis not present

## 2016-07-19 DIAGNOSIS — R52 Pain, unspecified: Secondary | ICD-10-CM | POA: Diagnosis not present

## 2016-07-19 DIAGNOSIS — Z888 Allergy status to other drugs, medicaments and biological substances status: Secondary | ICD-10-CM | POA: Diagnosis not present

## 2016-07-19 DIAGNOSIS — Z8673 Personal history of transient ischemic attack (TIA), and cerebral infarction without residual deficits: Secondary | ICD-10-CM | POA: Diagnosis not present

## 2016-07-31 ENCOUNTER — Other Ambulatory Visit: Payer: Self-pay | Admitting: Family Medicine

## 2016-08-02 DIAGNOSIS — M549 Dorsalgia, unspecified: Secondary | ICD-10-CM | POA: Diagnosis not present

## 2016-08-02 DIAGNOSIS — M545 Low back pain: Secondary | ICD-10-CM | POA: Diagnosis not present

## 2016-08-02 DIAGNOSIS — N390 Urinary tract infection, site not specified: Secondary | ICD-10-CM | POA: Diagnosis not present

## 2016-08-17 DIAGNOSIS — D571 Sickle-cell disease without crisis: Secondary | ICD-10-CM | POA: Diagnosis not present

## 2016-08-17 DIAGNOSIS — Z86718 Personal history of other venous thrombosis and embolism: Secondary | ICD-10-CM | POA: Diagnosis not present

## 2016-08-17 DIAGNOSIS — M1612 Unilateral primary osteoarthritis, left hip: Secondary | ICD-10-CM | POA: Diagnosis not present

## 2016-08-17 DIAGNOSIS — Z0289 Encounter for other administrative examinations: Secondary | ICD-10-CM | POA: Diagnosis not present

## 2016-08-17 DIAGNOSIS — R0789 Other chest pain: Secondary | ICD-10-CM | POA: Diagnosis not present

## 2016-08-17 DIAGNOSIS — R079 Chest pain, unspecified: Secondary | ICD-10-CM | POA: Diagnosis not present

## 2016-08-17 DIAGNOSIS — G40909 Epilepsy, unspecified, not intractable, without status epilepticus: Secondary | ICD-10-CM | POA: Diagnosis not present

## 2016-08-17 DIAGNOSIS — G43909 Migraine, unspecified, not intractable, without status migrainosus: Secondary | ICD-10-CM | POA: Diagnosis not present

## 2016-08-18 DIAGNOSIS — I209 Angina pectoris, unspecified: Secondary | ICD-10-CM | POA: Diagnosis not present

## 2016-08-18 DIAGNOSIS — R0789 Other chest pain: Secondary | ICD-10-CM | POA: Diagnosis not present

## 2016-08-18 DIAGNOSIS — Z7901 Long term (current) use of anticoagulants: Secondary | ICD-10-CM | POA: Diagnosis not present

## 2016-08-18 DIAGNOSIS — Z887 Allergy status to serum and vaccine status: Secondary | ICD-10-CM | POA: Diagnosis not present

## 2016-08-18 DIAGNOSIS — Z9889 Other specified postprocedural states: Secondary | ICD-10-CM | POA: Diagnosis not present

## 2016-08-18 DIAGNOSIS — Z91040 Latex allergy status: Secondary | ICD-10-CM | POA: Diagnosis not present

## 2016-08-18 DIAGNOSIS — G43909 Migraine, unspecified, not intractable, without status migrainosus: Secondary | ICD-10-CM | POA: Diagnosis present

## 2016-08-18 DIAGNOSIS — R569 Unspecified convulsions: Secondary | ICD-10-CM | POA: Diagnosis not present

## 2016-08-18 DIAGNOSIS — Z885 Allergy status to narcotic agent status: Secondary | ICD-10-CM | POA: Diagnosis not present

## 2016-08-18 DIAGNOSIS — Z86718 Personal history of other venous thrombosis and embolism: Secondary | ICD-10-CM | POA: Diagnosis not present

## 2016-08-18 DIAGNOSIS — Z8673 Personal history of transient ischemic attack (TIA), and cerebral infarction without residual deficits: Secondary | ICD-10-CM | POA: Diagnosis not present

## 2016-08-18 DIAGNOSIS — D57219 Sickle-cell/Hb-C disease with crisis, unspecified: Secondary | ICD-10-CM | POA: Diagnosis not present

## 2016-08-18 DIAGNOSIS — M79605 Pain in left leg: Secondary | ICD-10-CM | POA: Diagnosis not present

## 2016-08-18 DIAGNOSIS — D571 Sickle-cell disease without crisis: Secondary | ICD-10-CM | POA: Diagnosis not present

## 2016-08-18 DIAGNOSIS — M25552 Pain in left hip: Secondary | ICD-10-CM | POA: Diagnosis not present

## 2016-08-18 DIAGNOSIS — Z888 Allergy status to other drugs, medicaments and biological substances status: Secondary | ICD-10-CM | POA: Diagnosis not present

## 2016-08-18 DIAGNOSIS — M25562 Pain in left knee: Secondary | ICD-10-CM | POA: Diagnosis not present

## 2016-08-18 DIAGNOSIS — M1612 Unilateral primary osteoarthritis, left hip: Secondary | ICD-10-CM | POA: Diagnosis present

## 2016-08-18 DIAGNOSIS — G40909 Epilepsy, unspecified, not intractable, without status epilepticus: Secondary | ICD-10-CM | POA: Diagnosis not present

## 2016-08-18 DIAGNOSIS — R079 Chest pain, unspecified: Secondary | ICD-10-CM | POA: Diagnosis not present

## 2016-08-18 DIAGNOSIS — Z9049 Acquired absence of other specified parts of digestive tract: Secondary | ICD-10-CM | POA: Diagnosis not present

## 2016-08-19 DIAGNOSIS — M25562 Pain in left knee: Secondary | ICD-10-CM | POA: Diagnosis not present

## 2016-08-19 DIAGNOSIS — M25552 Pain in left hip: Secondary | ICD-10-CM | POA: Diagnosis not present

## 2016-08-19 DIAGNOSIS — M1612 Unilateral primary osteoarthritis, left hip: Secondary | ICD-10-CM | POA: Diagnosis not present

## 2016-10-23 DIAGNOSIS — M79605 Pain in left leg: Secondary | ICD-10-CM | POA: Diagnosis not present

## 2016-10-23 DIAGNOSIS — R791 Abnormal coagulation profile: Secondary | ICD-10-CM | POA: Diagnosis not present

## 2016-10-23 DIAGNOSIS — D571 Sickle-cell disease without crisis: Secondary | ICD-10-CM | POA: Diagnosis not present

## 2016-10-23 DIAGNOSIS — M791 Myalgia: Secondary | ICD-10-CM | POA: Diagnosis not present

## 2016-10-23 DIAGNOSIS — R079 Chest pain, unspecified: Secondary | ICD-10-CM | POA: Diagnosis not present

## 2016-10-23 DIAGNOSIS — Z86718 Personal history of other venous thrombosis and embolism: Secondary | ICD-10-CM | POA: Diagnosis not present

## 2016-10-23 DIAGNOSIS — M7989 Other specified soft tissue disorders: Secondary | ICD-10-CM | POA: Diagnosis not present

## 2016-10-23 DIAGNOSIS — M79604 Pain in right leg: Secondary | ICD-10-CM | POA: Diagnosis not present

## 2016-11-02 DIAGNOSIS — Z79899 Other long term (current) drug therapy: Secondary | ICD-10-CM | POA: Diagnosis not present

## 2016-11-02 DIAGNOSIS — D5700 Hb-SS disease with crisis, unspecified: Secondary | ICD-10-CM | POA: Diagnosis not present

## 2016-11-02 DIAGNOSIS — G8929 Other chronic pain: Secondary | ICD-10-CM | POA: Diagnosis not present

## 2016-11-02 DIAGNOSIS — I639 Cerebral infarction, unspecified: Secondary | ICD-10-CM | POA: Diagnosis not present

## 2016-11-02 DIAGNOSIS — G40909 Epilepsy, unspecified, not intractable, without status epilepticus: Secondary | ICD-10-CM | POA: Diagnosis not present

## 2016-11-27 DIAGNOSIS — R0789 Other chest pain: Secondary | ICD-10-CM | POA: Diagnosis not present

## 2016-12-05 DIAGNOSIS — D563 Thalassemia minor: Secondary | ICD-10-CM | POA: Diagnosis not present

## 2016-12-05 DIAGNOSIS — Z6825 Body mass index (BMI) 25.0-25.9, adult: Secondary | ICD-10-CM | POA: Diagnosis not present

## 2016-12-05 DIAGNOSIS — D571 Sickle-cell disease without crisis: Secondary | ICD-10-CM | POA: Diagnosis not present

## 2016-12-05 DIAGNOSIS — I82291 Chronic embolism and thrombosis of other thoracic veins: Secondary | ICD-10-CM | POA: Diagnosis not present

## 2016-12-05 DIAGNOSIS — Z86718 Personal history of other venous thrombosis and embolism: Secondary | ICD-10-CM | POA: Diagnosis not present

## 2016-12-13 DIAGNOSIS — R109 Unspecified abdominal pain: Secondary | ICD-10-CM | POA: Diagnosis not present

## 2016-12-13 DIAGNOSIS — R1084 Generalized abdominal pain: Secondary | ICD-10-CM | POA: Diagnosis not present

## 2016-12-13 DIAGNOSIS — G40909 Epilepsy, unspecified, not intractable, without status epilepticus: Secondary | ICD-10-CM | POA: Diagnosis not present

## 2016-12-13 DIAGNOSIS — G936 Cerebral edema: Secondary | ICD-10-CM | POA: Diagnosis not present

## 2016-12-13 DIAGNOSIS — I69354 Hemiplegia and hemiparesis following cerebral infarction affecting left non-dominant side: Secondary | ICD-10-CM | POA: Diagnosis not present

## 2016-12-13 DIAGNOSIS — Z0289 Encounter for other administrative examinations: Secondary | ICD-10-CM | POA: Diagnosis not present

## 2016-12-13 DIAGNOSIS — D649 Anemia, unspecified: Secondary | ICD-10-CM | POA: Diagnosis not present

## 2016-12-13 DIAGNOSIS — I82409 Acute embolism and thrombosis of unspecified deep veins of unspecified lower extremity: Secondary | ICD-10-CM | POA: Diagnosis not present

## 2016-12-13 DIAGNOSIS — G0490 Encephalitis and encephalomyelitis, unspecified: Secondary | ICD-10-CM | POA: Diagnosis not present

## 2016-12-13 DIAGNOSIS — N281 Cyst of kidney, acquired: Secondary | ICD-10-CM | POA: Diagnosis not present

## 2016-12-13 DIAGNOSIS — D571 Sickle-cell disease without crisis: Secondary | ICD-10-CM | POA: Diagnosis not present

## 2016-12-13 DIAGNOSIS — Z9049 Acquired absence of other specified parts of digestive tract: Secondary | ICD-10-CM | POA: Diagnosis not present

## 2016-12-13 DIAGNOSIS — D5700 Hb-SS disease with crisis, unspecified: Secondary | ICD-10-CM | POA: Diagnosis not present

## 2016-12-13 DIAGNOSIS — R05 Cough: Secondary | ICD-10-CM | POA: Diagnosis not present

## 2016-12-14 DIAGNOSIS — R531 Weakness: Secondary | ICD-10-CM | POA: Diagnosis not present

## 2016-12-14 DIAGNOSIS — R569 Unspecified convulsions: Secondary | ICD-10-CM | POA: Diagnosis not present

## 2016-12-14 DIAGNOSIS — R079 Chest pain, unspecified: Secondary | ICD-10-CM | POA: Diagnosis not present

## 2016-12-14 DIAGNOSIS — M791 Myalgia: Secondary | ICD-10-CM | POA: Diagnosis not present

## 2016-12-14 DIAGNOSIS — G936 Cerebral edema: Secondary | ICD-10-CM | POA: Diagnosis not present

## 2016-12-14 DIAGNOSIS — N281 Cyst of kidney, acquired: Secondary | ICD-10-CM | POA: Diagnosis not present

## 2016-12-14 DIAGNOSIS — Z888 Allergy status to other drugs, medicaments and biological substances status: Secondary | ICD-10-CM | POA: Diagnosis not present

## 2016-12-14 DIAGNOSIS — M545 Low back pain: Secondary | ICD-10-CM | POA: Diagnosis not present

## 2016-12-14 DIAGNOSIS — R109 Unspecified abdominal pain: Secondary | ICD-10-CM | POA: Diagnosis not present

## 2016-12-14 DIAGNOSIS — N390 Urinary tract infection, site not specified: Secondary | ICD-10-CM | POA: Diagnosis not present

## 2016-12-14 DIAGNOSIS — Z885 Allergy status to narcotic agent status: Secondary | ICD-10-CM | POA: Diagnosis not present

## 2016-12-14 DIAGNOSIS — G0490 Encephalitis and encephalomyelitis, unspecified: Secondary | ICD-10-CM | POA: Diagnosis present

## 2016-12-14 DIAGNOSIS — D571 Sickle-cell disease without crisis: Secondary | ICD-10-CM | POA: Diagnosis not present

## 2016-12-14 DIAGNOSIS — Z9049 Acquired absence of other specified parts of digestive tract: Secondary | ICD-10-CM | POA: Diagnosis not present

## 2016-12-14 DIAGNOSIS — D649 Anemia, unspecified: Secondary | ICD-10-CM | POA: Diagnosis not present

## 2016-12-14 DIAGNOSIS — R221 Localized swelling, mass and lump, neck: Secondary | ICD-10-CM | POA: Diagnosis not present

## 2016-12-14 DIAGNOSIS — M546 Pain in thoracic spine: Secondary | ICD-10-CM | POA: Diagnosis not present

## 2016-12-14 DIAGNOSIS — I69354 Hemiplegia and hemiparesis following cerebral infarction affecting left non-dominant side: Secondary | ICD-10-CM | POA: Diagnosis not present

## 2016-12-14 DIAGNOSIS — R490 Dysphonia: Secondary | ICD-10-CM | POA: Diagnosis not present

## 2016-12-14 DIAGNOSIS — I82409 Acute embolism and thrombosis of unspecified deep veins of unspecified lower extremity: Secondary | ICD-10-CM | POA: Diagnosis present

## 2016-12-14 DIAGNOSIS — G8191 Hemiplegia, unspecified affecting right dominant side: Secondary | ICD-10-CM | POA: Diagnosis not present

## 2016-12-14 DIAGNOSIS — Z9851 Tubal ligation status: Secondary | ICD-10-CM | POA: Diagnosis not present

## 2016-12-14 DIAGNOSIS — G40909 Epilepsy, unspecified, not intractable, without status epilepticus: Secondary | ICD-10-CM | POA: Diagnosis not present

## 2016-12-14 DIAGNOSIS — R609 Edema, unspecified: Secondary | ICD-10-CM | POA: Diagnosis not present

## 2016-12-14 DIAGNOSIS — Z886 Allergy status to analgesic agent status: Secondary | ICD-10-CM | POA: Diagnosis not present

## 2016-12-14 DIAGNOSIS — Z91040 Latex allergy status: Secondary | ICD-10-CM | POA: Diagnosis not present

## 2016-12-14 DIAGNOSIS — M549 Dorsalgia, unspecified: Secondary | ICD-10-CM | POA: Diagnosis not present

## 2016-12-14 DIAGNOSIS — D5700 Hb-SS disease with crisis, unspecified: Secondary | ICD-10-CM | POA: Diagnosis not present

## 2016-12-14 DIAGNOSIS — F329 Major depressive disorder, single episode, unspecified: Secondary | ICD-10-CM | POA: Diagnosis present

## 2016-12-14 DIAGNOSIS — R4182 Altered mental status, unspecified: Secondary | ICD-10-CM | POA: Diagnosis not present

## 2016-12-15 DIAGNOSIS — R609 Edema, unspecified: Secondary | ICD-10-CM | POA: Diagnosis not present

## 2016-12-15 DIAGNOSIS — M545 Low back pain: Secondary | ICD-10-CM | POA: Diagnosis not present

## 2016-12-15 DIAGNOSIS — M546 Pain in thoracic spine: Secondary | ICD-10-CM | POA: Diagnosis not present

## 2016-12-15 DIAGNOSIS — D5700 Hb-SS disease with crisis, unspecified: Secondary | ICD-10-CM | POA: Diagnosis not present

## 2016-12-20 DIAGNOSIS — R531 Weakness: Secondary | ICD-10-CM | POA: Diagnosis not present

## 2016-12-20 DIAGNOSIS — G936 Cerebral edema: Secondary | ICD-10-CM | POA: Diagnosis not present

## 2016-12-21 DIAGNOSIS — R4182 Altered mental status, unspecified: Secondary | ICD-10-CM | POA: Diagnosis not present

## 2016-12-21 DIAGNOSIS — R531 Weakness: Secondary | ICD-10-CM | POA: Diagnosis not present

## 2016-12-22 DIAGNOSIS — D571 Sickle-cell disease without crisis: Secondary | ICD-10-CM | POA: Diagnosis not present

## 2017-01-01 DIAGNOSIS — Z760 Encounter for issue of repeat prescription: Secondary | ICD-10-CM | POA: Diagnosis not present

## 2017-01-01 DIAGNOSIS — I639 Cerebral infarction, unspecified: Secondary | ICD-10-CM | POA: Diagnosis not present

## 2017-01-01 DIAGNOSIS — G40909 Epilepsy, unspecified, not intractable, without status epilepticus: Secondary | ICD-10-CM | POA: Diagnosis not present

## 2017-01-01 DIAGNOSIS — H539 Unspecified visual disturbance: Secondary | ICD-10-CM | POA: Diagnosis not present

## 2017-01-01 DIAGNOSIS — G8929 Other chronic pain: Secondary | ICD-10-CM | POA: Diagnosis not present

## 2017-01-01 DIAGNOSIS — R531 Weakness: Secondary | ICD-10-CM | POA: Diagnosis not present

## 2017-01-08 DIAGNOSIS — I639 Cerebral infarction, unspecified: Secondary | ICD-10-CM | POA: Diagnosis not present

## 2017-01-08 DIAGNOSIS — Z86718 Personal history of other venous thrombosis and embolism: Secondary | ICD-10-CM | POA: Diagnosis not present

## 2017-01-08 DIAGNOSIS — R000 Tachycardia, unspecified: Secondary | ICD-10-CM | POA: Diagnosis not present

## 2017-01-08 DIAGNOSIS — D571 Sickle-cell disease without crisis: Secondary | ICD-10-CM | POA: Diagnosis not present

## 2017-01-08 DIAGNOSIS — Z6825 Body mass index (BMI) 25.0-25.9, adult: Secondary | ICD-10-CM | POA: Diagnosis not present

## 2017-01-08 DIAGNOSIS — R079 Chest pain, unspecified: Secondary | ICD-10-CM | POA: Diagnosis not present

## 2017-01-08 DIAGNOSIS — R002 Palpitations: Secondary | ICD-10-CM | POA: Diagnosis not present

## 2017-01-08 DIAGNOSIS — R569 Unspecified convulsions: Secondary | ICD-10-CM | POA: Diagnosis not present

## 2017-01-09 DIAGNOSIS — H524 Presbyopia: Secondary | ICD-10-CM | POA: Diagnosis not present

## 2017-01-09 DIAGNOSIS — H532 Diplopia: Secondary | ICD-10-CM | POA: Diagnosis not present

## 2017-01-09 DIAGNOSIS — I6350 Cerebral infarction due to unspecified occlusion or stenosis of unspecified cerebral artery: Secondary | ICD-10-CM | POA: Diagnosis not present

## 2017-01-25 DIAGNOSIS — Z6825 Body mass index (BMI) 25.0-25.9, adult: Secondary | ICD-10-CM | POA: Diagnosis not present

## 2017-01-25 DIAGNOSIS — G40209 Localization-related (focal) (partial) symptomatic epilepsy and epileptic syndromes with complex partial seizures, not intractable, without status epilepticus: Secondary | ICD-10-CM | POA: Diagnosis not present

## 2017-01-25 DIAGNOSIS — R930 Abnormal findings on diagnostic imaging of skull and head, not elsewhere classified: Secondary | ICD-10-CM | POA: Diagnosis not present

## 2017-02-01 DIAGNOSIS — R569 Unspecified convulsions: Secondary | ICD-10-CM | POA: Diagnosis not present

## 2017-03-12 DIAGNOSIS — D563 Thalassemia minor: Secondary | ICD-10-CM | POA: Diagnosis not present

## 2017-03-12 DIAGNOSIS — Z6826 Body mass index (BMI) 26.0-26.9, adult: Secondary | ICD-10-CM | POA: Diagnosis not present

## 2017-03-12 DIAGNOSIS — I82291 Chronic embolism and thrombosis of other thoracic veins: Secondary | ICD-10-CM | POA: Diagnosis not present

## 2017-03-12 DIAGNOSIS — M25511 Pain in right shoulder: Secondary | ICD-10-CM | POA: Diagnosis not present

## 2017-03-12 DIAGNOSIS — D571 Sickle-cell disease without crisis: Secondary | ICD-10-CM | POA: Diagnosis not present

## 2017-04-03 DIAGNOSIS — Z6826 Body mass index (BMI) 26.0-26.9, adult: Secondary | ICD-10-CM | POA: Diagnosis not present

## 2017-04-03 DIAGNOSIS — Z862 Personal history of diseases of the blood and blood-forming organs and certain disorders involving the immune mechanism: Secondary | ICD-10-CM | POA: Diagnosis not present

## 2017-04-03 DIAGNOSIS — M25511 Pain in right shoulder: Secondary | ICD-10-CM | POA: Diagnosis not present

## 2017-04-03 DIAGNOSIS — M25512 Pain in left shoulder: Secondary | ICD-10-CM | POA: Diagnosis not present

## 2017-04-09 DIAGNOSIS — R569 Unspecified convulsions: Secondary | ICD-10-CM | POA: Diagnosis not present

## 2017-04-09 DIAGNOSIS — I639 Cerebral infarction, unspecified: Secondary | ICD-10-CM | POA: Diagnosis not present

## 2017-04-09 DIAGNOSIS — M25512 Pain in left shoulder: Secondary | ICD-10-CM | POA: Diagnosis not present

## 2017-04-09 DIAGNOSIS — D571 Sickle-cell disease without crisis: Secondary | ICD-10-CM | POA: Diagnosis not present

## 2017-04-09 DIAGNOSIS — Z86718 Personal history of other venous thrombosis and embolism: Secondary | ICD-10-CM | POA: Diagnosis not present

## 2017-04-09 DIAGNOSIS — Z6826 Body mass index (BMI) 26.0-26.9, adult: Secondary | ICD-10-CM | POA: Diagnosis not present

## 2017-04-09 DIAGNOSIS — D563 Thalassemia minor: Secondary | ICD-10-CM | POA: Diagnosis not present

## 2017-04-09 DIAGNOSIS — M25511 Pain in right shoulder: Secondary | ICD-10-CM | POA: Diagnosis not present

## 2017-04-22 DIAGNOSIS — D5700 Hb-SS disease with crisis, unspecified: Secondary | ICD-10-CM | POA: Diagnosis not present

## 2017-04-22 DIAGNOSIS — Z8673 Personal history of transient ischemic attack (TIA), and cerebral infarction without residual deficits: Secondary | ICD-10-CM | POA: Diagnosis not present

## 2017-04-22 DIAGNOSIS — R0602 Shortness of breath: Secondary | ICD-10-CM | POA: Diagnosis not present

## 2017-04-22 DIAGNOSIS — J189 Pneumonia, unspecified organism: Secondary | ICD-10-CM | POA: Diagnosis not present

## 2017-05-02 DIAGNOSIS — G40209 Localization-related (focal) (partial) symptomatic epilepsy and epileptic syndromes with complex partial seizures, not intractable, without status epilepticus: Secondary | ICD-10-CM | POA: Diagnosis not present

## 2017-05-02 DIAGNOSIS — Z6826 Body mass index (BMI) 26.0-26.9, adult: Secondary | ICD-10-CM | POA: Diagnosis not present

## 2017-05-04 DIAGNOSIS — D5700 Hb-SS disease with crisis, unspecified: Secondary | ICD-10-CM | POA: Diagnosis not present

## 2017-05-04 DIAGNOSIS — M47812 Spondylosis without myelopathy or radiculopathy, cervical region: Secondary | ICD-10-CM | POA: Diagnosis not present

## 2017-05-04 DIAGNOSIS — D571 Sickle-cell disease without crisis: Secondary | ICD-10-CM | POA: Diagnosis not present

## 2017-05-04 DIAGNOSIS — R0789 Other chest pain: Secondary | ICD-10-CM | POA: Diagnosis not present

## 2017-05-04 DIAGNOSIS — M25512 Pain in left shoulder: Secondary | ICD-10-CM | POA: Diagnosis not present

## 2017-05-04 DIAGNOSIS — R079 Chest pain, unspecified: Secondary | ICD-10-CM | POA: Diagnosis not present

## 2017-06-08 DIAGNOSIS — S8991XA Unspecified injury of right lower leg, initial encounter: Secondary | ICD-10-CM | POA: Diagnosis not present

## 2017-06-08 DIAGNOSIS — S8982XA Other specified injuries of left lower leg, initial encounter: Secondary | ICD-10-CM | POA: Diagnosis not present

## 2017-06-08 DIAGNOSIS — S8981XA Other specified injuries of right lower leg, initial encounter: Secondary | ICD-10-CM | POA: Diagnosis not present

## 2017-06-08 DIAGNOSIS — M79605 Pain in left leg: Secondary | ICD-10-CM | POA: Diagnosis not present

## 2017-06-08 DIAGNOSIS — M79604 Pain in right leg: Secondary | ICD-10-CM | POA: Diagnosis not present

## 2017-06-08 DIAGNOSIS — G8929 Other chronic pain: Secondary | ICD-10-CM | POA: Diagnosis not present

## 2017-06-20 ENCOUNTER — Emergency Department (HOSPITAL_BASED_OUTPATIENT_CLINIC_OR_DEPARTMENT_OTHER): Payer: Medicare Other

## 2017-06-20 ENCOUNTER — Encounter (HOSPITAL_BASED_OUTPATIENT_CLINIC_OR_DEPARTMENT_OTHER): Payer: Self-pay

## 2017-06-20 ENCOUNTER — Emergency Department (HOSPITAL_BASED_OUTPATIENT_CLINIC_OR_DEPARTMENT_OTHER)
Admission: EM | Admit: 2017-06-20 | Discharge: 2017-06-20 | Disposition: A | Discharge status: Home / Self Care | Payer: Medicare Other | Attending: Emergency Medicine | Admitting: Emergency Medicine

## 2017-06-20 DIAGNOSIS — Z91040 Latex allergy status: Secondary | ICD-10-CM | POA: Insufficient documentation

## 2017-06-20 DIAGNOSIS — R600 Localized edema: Secondary | ICD-10-CM | POA: Diagnosis not present

## 2017-06-20 DIAGNOSIS — R0602 Shortness of breath: Secondary | ICD-10-CM | POA: Diagnosis not present

## 2017-06-20 DIAGNOSIS — D5700 Hb-SS disease with crisis, unspecified: Secondary | ICD-10-CM

## 2017-06-20 DIAGNOSIS — D57219 Sickle-cell/Hb-C disease with crisis, unspecified: Secondary | ICD-10-CM | POA: Diagnosis not present

## 2017-06-20 DIAGNOSIS — M79604 Pain in right leg: Secondary | ICD-10-CM | POA: Diagnosis not present

## 2017-06-20 DIAGNOSIS — R079 Chest pain, unspecified: Secondary | ICD-10-CM | POA: Diagnosis not present

## 2017-06-20 LAB — CBC WITH DIFFERENTIAL/PLATELET
BASOS ABS: 0.1 10*3/uL (ref 0.0–0.1)
BLASTS: 0 %
Band Neutrophils: 0 %
Basophils Relative: 1 %
EOS PCT: 0 %
Eosinophils Absolute: 0 10*3/uL (ref 0.0–0.7)
HEMATOCRIT: 23.4 % — AB (ref 36.0–46.0)
HEMOGLOBIN: 8.6 g/dL — AB (ref 12.0–15.0)
LYMPHS ABS: 2.7 10*3/uL (ref 0.7–4.0)
Lymphocytes Relative: 52 %
MCH: 43.7 pg — ABNORMAL HIGH (ref 26.0–34.0)
MCHC: 36.8 g/dL — ABNORMAL HIGH (ref 30.0–36.0)
MCV: 118.8 fL — ABNORMAL HIGH (ref 78.0–100.0)
METAMYELOCYTES PCT: 0 %
MONOS PCT: 5 %
Monocytes Absolute: 0.3 10*3/uL (ref 0.1–1.0)
Myelocytes: 0 %
NEUTROS ABS: 2.3 10*3/uL (ref 1.7–7.7)
Neutrophils Relative %: 42 %
OTHER: 0 %
Platelets: 328 10*3/uL (ref 150–400)
Promyelocytes Absolute: 0 %
RBC: 1.97 MIL/uL — AB (ref 3.87–5.11)
RDW: 14.6 % (ref 11.5–15.5)
Smear Review: ADEQUATE
WBC: 5.4 10*3/uL (ref 4.0–10.5)
nRBC: 5 /100 WBC — ABNORMAL HIGH

## 2017-06-20 LAB — COMPREHENSIVE METABOLIC PANEL
ALBUMIN: 4.2 g/dL (ref 3.5–5.0)
ALT: 20 U/L (ref 14–54)
AST: 26 U/L (ref 15–41)
Alkaline Phosphatase: 98 U/L (ref 38–126)
Anion gap: 9 (ref 5–15)
BILIRUBIN TOTAL: 1 mg/dL (ref 0.3–1.2)
BUN: 8 mg/dL (ref 6–20)
CHLORIDE: 107 mmol/L (ref 101–111)
CO2: 23 mmol/L (ref 22–32)
CREATININE: 0.88 mg/dL (ref 0.44–1.00)
Calcium: 9.2 mg/dL (ref 8.9–10.3)
GFR calc Af Amer: >60 mL/min (ref >60)
GFR calc non Af Amer: >60 mL/min (ref >60)
GLUCOSE: 101 mg/dL — AB (ref 65–99)
Potassium: 3.7 mmol/L (ref 3.5–5.1)
Sodium: 139 mmol/L (ref 135–145)
Total Protein: 7.9 g/dL (ref 6.5–8.1)

## 2017-06-20 LAB — LIPASE, BLOOD: LIPASE: 35 U/L (ref 11–51)

## 2017-06-20 LAB — PROTIME-INR
INR: 1.11
Prothrombin Time: 14.4 seconds (ref 11.4–15.2)

## 2017-06-20 LAB — TROPONIN I

## 2017-06-20 MED ORDER — HYDROMORPHONE HCL 1 MG/ML IJ SOLN
1.0000 mg | Freq: Once | INTRAMUSCULAR | Status: AC
  Administered 2017-06-20: 1 mg via INTRAVENOUS
  Filled 2017-06-20: qty 1

## 2017-06-20 MED ORDER — DIPHENHYDRAMINE HCL 25 MG PO CAPS
50.0000 mg | ORAL_CAPSULE | Freq: Once | ORAL | Status: AC
  Administered 2017-06-20: 50 mg via ORAL
  Filled 2017-06-20: qty 2

## 2017-06-20 MED ORDER — SODIUM CHLORIDE 0.9 % IV BOLUS (SEPSIS)
500.0000 mL | Freq: Once | INTRAVENOUS | Status: AC
  Administered 2017-06-20: 500 mL via INTRAVENOUS

## 2017-06-20 MED ORDER — ONDANSETRON HCL 4 MG/2ML IJ SOLN
4.0000 mg | Freq: Once | INTRAMUSCULAR | Status: AC
  Administered 2017-06-20: 4 mg via INTRAVENOUS
  Filled 2017-06-20: qty 2

## 2017-06-20 MED ORDER — IOPAMIDOL (ISOVUE-370) INJECTION 76%
100.0000 mL | Freq: Once | INTRAVENOUS | Status: AC | PRN
  Administered 2017-06-20: 80 mL via INTRAVENOUS

## 2017-06-20 NOTE — ED Provider Notes (Signed)
Please see previous physicians note regarding patient's presenting history and physical, initial ED course, and associated medical decision making.  41 year old female who presents with dyspnea and left sided chest pain. History of sickle cell anemia, prior DVT on Xarelto, and seizure disorder. Pending CT angio chest to evaluate for PE at time of sign out.   CT angiogram visualized and reviewed by radiology. There is no evidence of PE, pneumonia, edema, or other acute cardiopulmonary processes. Her symptoms have improved with analgesics here in the ED. She feels ready for discharge home. Blood work reviewed. She has a normal troponin with nonischemic EKG. She has stable anemia for her sickle cell.  Felt stable for discharge home. Strict return and follow-up instructions reviewed. She expressed understanding of all discharge instructions and felt comfortable with the plan of care.       Forde Dandy, MD 06/20/17 [phone]

## 2017-06-20 NOTE — ED Notes (Signed)
Attempted IV start to left upper arm.  Unable to obtain IV but was able to draw some labs.

## 2017-06-20 NOTE — ED Provider Notes (Signed)
Emergency Department Provider Note   I have reviewed the triage vital signs and the nursing notes.   HISTORY  Chief Complaint Shortness of Breath   HPI Heather Oconnell is a 41 y.o. female with PMH of SSD, CVA, prior DVT and history of blood clot with PICC line currently on and compliant with Xarelto presents to the emergency department for evaluation of generalized fatigue and dyspnea over the past 2 days. She feels that her symptoms are worsening and today she developed left lateral chest and back discomfort. She does have a history of sickle cell crisis, acute chest, and PE and states this feels most consistent with her sickle cell pain. She has had a mild productive cough along with chills but no fevers. No abdominal pain, diarrhea, vomiting. Her care is typically managed in [place] where she lives but is here visiting family. She has been compliant with Xarelto. No numbness/weakness in the arms/legs. No radiation of pain. No pleuritic or exertional chest pain.   The patient also notes increased right lower extremity swelling. She notes intermittent swelling in the leg over many years. She had a DVT diagnosed as she was 41 yo in that leg. She appreciates some increased swelling in the ankle and thigh today compared to baseline.   Past Medical History:  Diagnosis Date  . Blood clot due to device, implant, or graft   . Seizures ([place])   . Sickle cell disease ([place])   . Stroke [facility])     Patient Active Problem List   Diagnosis Date Noted  . Vitamin D deficiency 04/19/2016  . 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 Chronic anticoagulation 03/25/2016  . Sickle cell crisis ([place]) 03/25/2016  . Sickle cell pain crisis ([place]) 02/10/2016    Past Surgical History:  Procedure Laterality Date  . CESAREAN SECTION     exploritiory after c-section  . CHOLECYSTECTOMY    . TUBAL LIGATION      Current Outpatient Rx  . Order #: [id number] Class: Historical Med  . Order #:  [id number] Class: Print  . Order #: [id number] Class: Historical Med  . Order #: [id number] Class: Print  . Order #: [id number] Class: Historical Med  . Order #: [id number] Class: Print  . Order #: [id number] Class: Historical Med  . Order #: [id number] Class: Historical Med  . Order #: [id number] Class: Historical Med  . Order #: [id number] Class: Normal    Allergies Demerol [meperidine]; Fentanyl; Influenza vaccines; Morphine and related; Oxycontin [oxycodone hcl]; and Latex  No family history on file.  Social History Social History  Substance Use Topics  . Smoking status: Never Smoker  . Smokeless tobacco: Never Used  . Alcohol use No    Review of Systems  Constitutional: No fever. Positive chills. Positive generalized fatigue.  Eyes: No visual changes. ENT: No sore throat. Cardiovascular: Positive chest pain. Respiratory: Positive shortness of breath. Gastrointestinal: No abdominal pain.  No nausea, no vomiting.  No diarrhea.  No constipation. Genitourinary: Negative for dysuria. Musculoskeletal: Negative for back pain. Skin: Negative for rash. Neurological: Negative for headaches, focal weakness or numbness.  10-point ROS otherwise negative.  ____________________________________________   PHYSICAL EXAM:  VITAL SIGNS: ED Triage Vitals  Enc Vitals Group     BP 06/20/17 1222 112/78     Pulse Rate 06/20/17 1222 93     Resp 06/20/17 1222 18     Temp 06/20/17 1222 99 F (37.2 C)     Temp Source 06/20/17 1222 Oral     SpO2 06/20/17 1222 98 %  Weight 06/20/17 1222 173 lb (78.5 kg)     Height 06/20/17 1222 5\' 8"  (1.727 m)     Pain Score 06/20/17 1220 9   Constitutional: Alert and oriented. Well appearing and in no acute distress. Eyes: Conjunctivae are normal.  Head: Atraumatic. Nose: No congestion/rhinnorhea. Mouth/Throat: Mucous membranes are moist.   Neck: No stridor.   Cardiovascular: Normal rate, regular rhythm. Good peripheral circulation. Grossly normal heart  sounds.   Respiratory: Normal respiratory effort.  No retractions. Lungs CTAB. Gastrointestinal: Soft and nontender. No distention.  Musculoskeletal: No lower extremity tenderness nor edema. No gross deformities of extremities. Neurologic:  Normal speech and language. No gross focal neurologic deficits are appreciated.  Skin:  Skin is warm, dry and intact. No rash noted.  ____________________________________________   LABS (all labs ordered are listed, but only abnormal results are displayed)  Labs Reviewed  COMPREHENSIVE METABOLIC PANEL - Abnormal; Notable for the following:       Result Value   Glucose, Bld 101 (*)    All other components within normal limits  CBC WITH DIFFERENTIAL/PLATELET - Abnormal; Notable for the following:    RBC 1.97 (*)    Hemoglobin 8.6 (*)    HCT 23.4 (*)    MCV 118.8 (*)    MCH 43.7 (*)    MCHC 36.8 (*)    nRBC 5 (*)    All other components within normal limits  LIPASE, BLOOD  PROTIME-INR  TROPONIN I  CBC WITH DIFFERENTIAL/PLATELET  CBC WITH DIFFERENTIAL/PLATELET   ____________________________________________  EKG   EKG Interpretation  Date/Time:  Wednesday June 20 2017 14:10:34 EDT Ventricular Rate:  80 PR Interval:    QRS Duration: 80 QT Interval:  375 QTC Calculation: 433 R Axis:   29 Text Interpretation:  Sinus rhythm Abnormal R-wave progression, early transition Baseline wander in lead(s) II aVF No STEMI.  Confirmed by Nanda Quinton [phone]) on 06/20/2017 2:19:21 PM       ____________________________________________  RADIOLOGY  Dg Chest 2 View  Result Date: 06/20/2017 CLINICAL DATA:  Chest pain. EXAM: CHEST  2 VIEW COMPARISON:  03/29/2016.  03/25/2016 . FINDINGS: Mediastinum hilar structures normal. Mild left mid lung field subsegmental atelectasis scratched it mild left lung field left base subsegmental atelectasis and/or scarring. Similar findings noted on prior exam. No acute infiltrate. Heart size normal. No acute bony  abnormality. IMPRESSION: Mild left base subsegmental atelectasis and/or scarring. Chest is stable prior exam. No acute abnormality identified. Electronically Signed   By: Marcello Moores  Register   On: 06/20/2017 14:16   US Venous Img Lower Unilateral Right  Result Date: 06/20/2017 CLINICAL DATA:  Right lower extremity pain and edema EXAM: RIGHT LOWER EXTREMITY VENOUS DUPLEX ULTRASOUND TECHNIQUE: Gray-scale sonography with graded compression, as well as color Doppler and duplex ultrasound were performed to evaluate the right lower extremity deep venous system from the level of the common femoral vein and including the common femoral, femoral, profunda femoral, popliteal and calf veins including the posterior tibial, peroneal and gastrocnemius veins when visible. The superficial great saphenous vein was also interrogated. Spectral Doppler was utilized to evaluate flow at rest and with distal augmentation maneuvers in the common femoral, femoral and popliteal veins. COMPARISON:  None. FINDINGS: Contralateral Common Femoral Vein: Respiratory phasicity is normal and symmetric with the symptomatic side. No evidence of thrombus. Normal compressibility. Common Femoral Vein: No evidence of thrombus. Normal compressibility, respiratory phasicity and response to augmentation. Saphenofemoral Junction: No evidence of thrombus. Normal compressibility and flow on color Doppler imaging.  Profunda Femoral Vein: No evidence of thrombus. Normal compressibility and flow on color Doppler imaging. Femoral Vein: No evidence of thrombus. Normal compressibility, respiratory phasicity and response to augmentation. Popliteal Vein: No evidence of thrombus. Normal compressibility, respiratory phasicity and response to augmentation. Calf Veins: No evidence of thrombus. Normal compressibility and flow on color Doppler imaging. Superficial Great Saphenous Vein: No evidence of thrombus. Normal compressibility and flow on color Doppler imaging. Venous  Reflux:  None. Other Findings:  None. IMPRESSION: No evidence of deep venous thrombosis in the right lower extremity. Left common femoral vein also patent. Electronically Signed   By: Lowella Grip III M.D.   On: 06/20/2017 14:08    ____________________________________________   PROCEDURES  Procedure(s) performed:   Procedures  None  ____________________________________________   INITIAL IMPRESSION / ASSESSMENT AND PLAN / ED COURSE  Pertinent labs & imaging results that were available during my care of the patient were reviewed by me and considered in my medical decision making (see chart for details).  Patient presents to the emergency department for evaluation of dyspnea, generalized fatigue, and left sided, atypical chest pain. Patient with history of thromboembolic disease but has been compliant with her is relatively low. Patient with some chills but denies significant productive cough or fever. Plan for x-ray to evaluate for possible acute chest. Patient with no oxygen requirement or tachycardia. Plan for IV fluid hydration and lab work. Will also obtain ultrasound of the right lower extremity given possible worsening swelling in that leg compared to her baseline but patient often has intermittent swelling in the legs so DVT remains lower on differential.   03:00 PM Labs largely unremarkable. CBC pending. Negative troponin. Chest x-ray with no change from baseline. No DVT on right lower extremity ultrasound. No tachycardia or hypoxemia to suggest acute PE. Suspect sickle cell related pain. Plan for pain and nausea medication with IVF and discharge once pain is well controlled.   CTA and CBC pending. Care transferred to Dr. Oleta Mouse who will follow results and assist with disposition. Anticipate discharge if CBC and CTA at baseline.  ____________________________________________  FINAL CLINICAL IMPRESSION(S) / ED DIAGNOSES  Final diagnoses:  SOB (shortness of breath)  Right leg  pain     MEDICATIONS GIVEN DURING THIS VISIT:  Medications  sodium chloride 0.9 % bolus 500 mL (500 mLs Intravenous New Bag/Given 06/20/17 1307)  HYDROmorphone (DILAUDID) injection 1 mg (1 mg Intravenous Given 06/20/17 1434)  ondansetron (ZOFRAN) injection 4 mg (4 mg Intravenous Given 06/20/17 1432)     NEW OUTPATIENT MEDICATIONS STARTED DURING THIS VISIT:  None   Note:  This document was prepared using Dragon voice recognition software and may include unintentional dictation errors.  Nanda Quinton, MD Emergency Medicine   Ayansh Feutz, [facility], MD 06/21/17 1225

## 2017-06-20 NOTE — ED Triage Notes (Signed)
C/o SOB x 4 days-presents to triage in w/c-NAD

## 2017-06-20 NOTE — ED Notes (Signed)
Patient transported to CT 

## 2017-06-20 NOTE — Discharge Instructions (Signed)
Your CT does not show blood clot, pneumonia, or other acute lung problems.  Please follow-up closely with your sickle cell doctor when you return home.  Return without fail for worsening symptoms, including fever, worsening pain or difficulty breathing, or any other symptoms concerning to you.

## 2017-06-26 DIAGNOSIS — M549 Dorsalgia, unspecified: Secondary | ICD-10-CM | POA: Diagnosis not present

## 2017-06-26 DIAGNOSIS — Z7901 Long term (current) use of anticoagulants: Secondary | ICD-10-CM | POA: Diagnosis not present

## 2017-06-26 DIAGNOSIS — R0602 Shortness of breath: Secondary | ICD-10-CM | POA: Diagnosis not present

## 2017-06-26 DIAGNOSIS — Z86718 Personal history of other venous thrombosis and embolism: Secondary | ICD-10-CM | POA: Diagnosis not present

## 2017-06-26 DIAGNOSIS — G40909 Epilepsy, unspecified, not intractable, without status epilepticus: Secondary | ICD-10-CM | POA: Diagnosis not present

## 2017-06-26 DIAGNOSIS — R531 Weakness: Secondary | ICD-10-CM | POA: Diagnosis not present

## 2017-06-26 DIAGNOSIS — D5700 Hb-SS disease with crisis, unspecified: Secondary | ICD-10-CM | POA: Diagnosis not present

## 2017-06-27 DIAGNOSIS — Z7901 Long term (current) use of anticoagulants: Secondary | ICD-10-CM | POA: Diagnosis not present

## 2017-06-27 DIAGNOSIS — Z86718 Personal history of other venous thrombosis and embolism: Secondary | ICD-10-CM | POA: Diagnosis not present

## 2017-06-27 DIAGNOSIS — D5700 Hb-SS disease with crisis, unspecified: Secondary | ICD-10-CM | POA: Diagnosis not present

## 2017-06-27 DIAGNOSIS — E875 Hyperkalemia: Secondary | ICD-10-CM | POA: Diagnosis not present

## 2017-06-27 DIAGNOSIS — G40909 Epilepsy, unspecified, not intractable, without status epilepticus: Secondary | ICD-10-CM | POA: Diagnosis not present

## 2017-06-28 DIAGNOSIS — Z7901 Long term (current) use of anticoagulants: Secondary | ICD-10-CM | POA: Diagnosis not present

## 2017-06-28 DIAGNOSIS — D5700 Hb-SS disease with crisis, unspecified: Secondary | ICD-10-CM | POA: Diagnosis not present

## 2017-06-28 DIAGNOSIS — Z86718 Personal history of other venous thrombosis and embolism: Secondary | ICD-10-CM | POA: Diagnosis not present

## 2017-06-28 DIAGNOSIS — G40909 Epilepsy, unspecified, not intractable, without status epilepticus: Secondary | ICD-10-CM | POA: Diagnosis not present

## 2017-07-04 DIAGNOSIS — Z888 Allergy status to other drugs, medicaments and biological substances status: Secondary | ICD-10-CM | POA: Diagnosis not present

## 2017-07-04 DIAGNOSIS — R042 Hemoptysis: Secondary | ICD-10-CM | POA: Diagnosis not present

## 2017-07-04 DIAGNOSIS — Z886 Allergy status to analgesic agent status: Secondary | ICD-10-CM | POA: Diagnosis not present

## 2017-07-04 DIAGNOSIS — Z91040 Latex allergy status: Secondary | ICD-10-CM | POA: Diagnosis not present

## 2017-07-04 DIAGNOSIS — R079 Chest pain, unspecified: Secondary | ICD-10-CM | POA: Diagnosis not present

## 2017-07-04 DIAGNOSIS — Z9851 Tubal ligation status: Secondary | ICD-10-CM | POA: Diagnosis not present

## 2017-07-04 DIAGNOSIS — D5700 Hb-SS disease with crisis, unspecified: Secondary | ICD-10-CM | POA: Diagnosis not present

## 2017-07-04 DIAGNOSIS — R0789 Other chest pain: Secondary | ICD-10-CM | POA: Diagnosis not present

## 2017-07-04 DIAGNOSIS — G40909 Epilepsy, unspecified, not intractable, without status epilepticus: Secondary | ICD-10-CM | POA: Diagnosis not present

## 2017-07-09 DIAGNOSIS — Q8901 Asplenia (congenital): Secondary | ICD-10-CM | POA: Diagnosis not present

## 2017-07-09 DIAGNOSIS — Z8659 Personal history of other mental and behavioral disorders: Secondary | ICD-10-CM | POA: Diagnosis not present

## 2017-07-09 DIAGNOSIS — Z86718 Personal history of other venous thrombosis and embolism: Secondary | ICD-10-CM | POA: Diagnosis not present

## 2017-07-09 DIAGNOSIS — G8929 Other chronic pain: Secondary | ICD-10-CM | POA: Diagnosis not present

## 2017-07-09 DIAGNOSIS — Z79899 Other long term (current) drug therapy: Secondary | ICD-10-CM | POA: Diagnosis not present

## 2017-07-09 DIAGNOSIS — D5700 Hb-SS disease with crisis, unspecified: Secondary | ICD-10-CM | POA: Diagnosis not present

## 2017-07-09 DIAGNOSIS — D571 Sickle-cell disease without crisis: Secondary | ICD-10-CM | POA: Diagnosis not present

## 2017-07-09 DIAGNOSIS — Z7901 Long term (current) use of anticoagulants: Secondary | ICD-10-CM | POA: Diagnosis not present

## 2017-07-10 DIAGNOSIS — M76891 Other specified enthesopathies of right lower limb, excluding foot: Secondary | ICD-10-CM | POA: Diagnosis not present

## 2017-07-10 DIAGNOSIS — R109 Unspecified abdominal pain: Secondary | ICD-10-CM | POA: Diagnosis not present

## 2017-07-10 DIAGNOSIS — M1711 Unilateral primary osteoarthritis, right knee: Secondary | ICD-10-CM | POA: Diagnosis not present

## 2017-07-10 DIAGNOSIS — M898X6 Other specified disorders of bone, lower leg: Secondary | ICD-10-CM | POA: Diagnosis not present

## 2017-07-10 DIAGNOSIS — M19021 Primary osteoarthritis, right elbow: Secondary | ICD-10-CM | POA: Diagnosis not present

## 2017-07-10 DIAGNOSIS — Y999 Unspecified external cause status: Secondary | ICD-10-CM | POA: Diagnosis not present

## 2017-07-10 DIAGNOSIS — M25561 Pain in right knee: Secondary | ICD-10-CM | POA: Diagnosis not present

## 2017-07-10 DIAGNOSIS — W19XXXA Unspecified fall, initial encounter: Secondary | ICD-10-CM | POA: Diagnosis not present

## 2017-07-10 DIAGNOSIS — M545 Low back pain: Secondary | ICD-10-CM | POA: Diagnosis not present

## 2017-07-10 DIAGNOSIS — M25521 Pain in right elbow: Secondary | ICD-10-CM | POA: Diagnosis not present

## 2017-07-10 DIAGNOSIS — R05 Cough: Secondary | ICD-10-CM | POA: Diagnosis not present

## 2017-07-10 DIAGNOSIS — R51 Headache: Secondary | ICD-10-CM | POA: Diagnosis not present

## 2017-07-13 DIAGNOSIS — W19XXXA Unspecified fall, initial encounter: Secondary | ICD-10-CM | POA: Diagnosis not present

## 2017-07-13 DIAGNOSIS — R51 Headache: Secondary | ICD-10-CM | POA: Diagnosis not present

## 2017-07-13 DIAGNOSIS — H53149 Visual discomfort, unspecified: Secondary | ICD-10-CM | POA: Diagnosis not present

## 2017-07-13 DIAGNOSIS — Y999 Unspecified external cause status: Secondary | ICD-10-CM | POA: Diagnosis not present

## 2017-07-13 DIAGNOSIS — S0990XA Unspecified injury of head, initial encounter: Secondary | ICD-10-CM | POA: Diagnosis not present

## 2017-07-13 DIAGNOSIS — M7989 Other specified soft tissue disorders: Secondary | ICD-10-CM | POA: Diagnosis not present

## 2017-07-13 DIAGNOSIS — G44309 Post-traumatic headache, unspecified, not intractable: Secondary | ICD-10-CM | POA: Diagnosis not present

## 2017-07-13 DIAGNOSIS — R05 Cough: Secondary | ICD-10-CM | POA: Diagnosis not present

## 2017-07-14 DIAGNOSIS — S0990XA Unspecified injury of head, initial encounter: Secondary | ICD-10-CM | POA: Diagnosis not present

## 2017-07-14 DIAGNOSIS — G44309 Post-traumatic headache, unspecified, not intractable: Secondary | ICD-10-CM | POA: Diagnosis not present

## 2017-07-16 DIAGNOSIS — Z79899 Other long term (current) drug therapy: Secondary | ICD-10-CM | POA: Diagnosis not present

## 2017-07-16 DIAGNOSIS — R110 Nausea: Secondary | ICD-10-CM | POA: Diagnosis not present

## 2017-07-16 DIAGNOSIS — G43909 Migraine, unspecified, not intractable, without status migrainosus: Secondary | ICD-10-CM | POA: Diagnosis not present

## 2017-07-16 DIAGNOSIS — Z885 Allergy status to narcotic agent status: Secondary | ICD-10-CM | POA: Diagnosis not present

## 2017-07-16 DIAGNOSIS — Z9851 Tubal ligation status: Secondary | ICD-10-CM | POA: Diagnosis not present

## 2017-07-16 DIAGNOSIS — Z9109 Other allergy status, other than to drugs and biological substances: Secondary | ICD-10-CM | POA: Diagnosis not present

## 2017-07-16 DIAGNOSIS — Z91040 Latex allergy status: Secondary | ICD-10-CM | POA: Diagnosis not present

## 2017-08-10 ENCOUNTER — Inpatient Hospital Stay
Admit: 2017-08-10 | Discharge: 2017-08-11 | Disposition: A | Discharge status: Home / Self Care | Payer: MEDICARE | Attending: Emergency Medicine

## 2017-08-10 ENCOUNTER — Emergency Department: Admit: 2017-08-10 | Payer: MEDICARE | Primary: Family Medicine

## 2017-08-10 DIAGNOSIS — D5700 Hb-SS disease with crisis, unspecified: Secondary | ICD-10-CM | POA: Diagnosis not present

## 2017-08-10 DIAGNOSIS — Z79899 Other long term (current) drug therapy: Secondary | ICD-10-CM | POA: Diagnosis not present

## 2017-08-10 DIAGNOSIS — R079 Chest pain, unspecified: Secondary | ICD-10-CM | POA: Diagnosis not present

## 2017-08-10 DIAGNOSIS — D571 Sickle-cell disease without crisis: Secondary | ICD-10-CM | POA: Diagnosis not present

## 2017-08-10 LAB — CBC WITH AUTOMATED DIFF
ABS. BASOPHILS: 0 10*3/uL (ref 0.0–0.2)
ABS. EOSINOPHILS: 0.1 10*3/uL (ref 0.0–0.8)
ABS. IMM. GRANS.: 0 10*3/uL (ref 0.0–0.5)
ABS. LYMPHOCYTES: 3.3 10*3/uL (ref 0.5–4.6)
ABS. MONOCYTES: 0.7 10*3/uL (ref 0.1–1.3)
ABS. NEUTROPHILS: 2.5 10*3/uL (ref 1.7–8.2)
ABSOLUTE NRBC: 0.14 10*3/uL (ref 0.0–0.2)
BASOPHILS: 0 % (ref 0.0–2.0)
EOSINOPHILS: 1 % (ref 0.5–7.8)
HCT: 27.6 % — ABNORMAL LOW (ref 35.8–46.3)
HGB: 9.9 g/dL — ABNORMAL LOW (ref 11.7–15.4)
IMMATURE GRANULOCYTES: 0 % (ref 0.0–5.0)
LYMPHOCYTES: 50 % — ABNORMAL HIGH (ref 13–44)
MCH: 42.5 PG — ABNORMAL HIGH (ref 26.1–32.9)
MCHC: 35.9 g/dL — ABNORMAL HIGH (ref 31.4–35.0)
MCV: 118.5 FL — ABNORMAL HIGH (ref 79.6–97.8)
MONOCYTES: 11 % (ref 4.0–12.0)
MPV: 9.9 FL (ref 9.4–12.3)
NEUTROPHILS: 38 % — ABNORMAL LOW (ref 43–78)
PLATELET: 639 10*3/uL — ABNORMAL HIGH (ref 150–450)
RBC: 2.33 M/uL — ABNORMAL LOW (ref 4.05–5.2)
RDW: 16.2 %
WBC: 6.5 10*3/uL (ref 4.3–11.1)

## 2017-08-10 LAB — METABOLIC PANEL, COMPREHENSIVE
A-G Ratio: 0.8 — ABNORMAL LOW (ref 1.2–3.5)
ALT (SGPT): 32 U/L (ref 12–65)
AST (SGOT): 82 U/L — ABNORMAL HIGH (ref 15–37)
Albumin: 3.8 g/dL (ref 3.5–5.0)
Alk. phosphatase: 135 U/L (ref 50–136)
Anion gap: 9 mmol/L (ref 7–16)
BUN: 5 MG/DL — ABNORMAL LOW (ref 6–23)
Bilirubin, total: 0.6 MG/DL (ref 0.2–1.1)
CO2: 24 mmol/L (ref 21–32)
Calcium: 9.2 MG/DL (ref 8.3–10.4)
Chloride: 109 mmol/L — ABNORMAL HIGH (ref 98–107)
Creatinine: 0.59 MG/DL — ABNORMAL LOW (ref 0.6–1.0)
GFR est AA: >60 mL/min/{1.73_m2} (ref >60)
GFR est non-AA: >60 mL/min/{1.73_m2} (ref >60)
Globulin: 4.9 g/dL — ABNORMAL HIGH (ref 2.3–3.5)
Glucose: 91 mg/dL (ref 65–100)
Potassium: 5.3 mmol/L — ABNORMAL HIGH (ref 3.5–5.1)
Protein, total: 8.7 g/dL — ABNORMAL HIGH (ref 6.3–8.2)
Sodium: 142 mmol/L (ref 136–145)

## 2017-08-10 LAB — RETICULOCYTE COUNT
Absolute Retic Cnt.: 0.1206 M/ul — ABNORMAL HIGH (ref 0.026–0.095)
Immature Retic Fraction: 25.4 % — ABNORMAL HIGH (ref 3.0–15.9)
Reticulocyte count: 5.2 % — ABNORMAL HIGH (ref 0.3–2.0)

## 2017-08-10 MED ORDER — HYDROMORPHONE (PF) 1 MG/ML IJ SOLN
1 mg/mL | INTRAMUSCULAR | Status: AC
Start: 2017-08-10 — End: 2017-08-10
  Administered 2017-08-10: 23:00:00 via INTRAVENOUS

## 2017-08-10 MED ORDER — SODIUM CHLORIDE 0.9% BOLUS IV
0.9 % | Freq: Once | INTRAVENOUS | Status: AC
Start: 2017-08-10 — End: 2017-08-10
  Administered 2017-08-10: 23:00:00 via INTRAVENOUS

## 2017-08-10 MED ORDER — ONDANSETRON (PF) 4 MG/2 ML INJECTION
4 mg/2 mL | INTRAMUSCULAR | Status: AC
Start: 2017-08-10 — End: 2017-08-10
  Administered 2017-08-10: 23:00:00 via INTRAVENOUS

## 2017-08-10 MED ORDER — DIPHENHYDRAMINE HCL 50 MG/ML IJ SOLN
50 mg/mL | INTRAMUSCULAR | Status: AC
Start: 2017-08-10 — End: 2017-08-10
  Administered 2017-08-10: 23:00:00 via INTRAVENOUS

## 2017-08-10 MED FILL — ONDANSETRON (PF) 4 MG/2 ML INJECTION: 4 mg/2 mL | INTRAMUSCULAR | Qty: 2

## 2017-08-10 MED FILL — HYDROMORPHONE (PF) 1 MG/ML IJ SOLN: 1 mg/mL | INTRAMUSCULAR | Qty: 1

## 2017-08-10 MED FILL — DIPHENHYDRAMINE HCL 50 MG/ML IJ SOLN: 50 mg/mL | INTRAMUSCULAR | Qty: 1

## 2017-08-10 NOTE — ED Notes (Signed)
Pt returned from radiology

## 2017-08-10 NOTE — ED Notes (Signed)
Pt out of bed to use restroom

## 2017-08-10 NOTE — ED Notes (Signed)
I have reviewed discharge instructions with the patient.  The patient verbalized understanding.    Patient left ED via Discharge Method: ambulatory to Home with friend.    Opportunity for questions and clarification provided.       Patient given 0 scripts.         To continue your aftercare when you leave the hospital, you may receive an automated call from our care team to check in on how you are doing.  This is a free service and part of our promise to provide the best care and service to meet your aftercare needs.??? If you have questions, or wish to unsubscribe from this service please call [phone].  Thank you for Choosing our [facility].

## 2017-08-10 NOTE — ED Provider Notes (Addendum)
HPI Comments: 41 year old lady with a history of sickle cell presenting with concerns about pain all over. Patient says that she normally lives in  [place] but is down here visiting family.   She said last night and yesterday she was exerting herself by helping her father pack up some boxes.  She says that she takes oral Dilaudid but that did not help.  Overall she says her pain is a 9 out of 10. Patient says she hasn't had a crisis bed and up to go to a hospital since January. atient does say she has some chest pain which is normal for her for her sickle cell.  She says she is not having any fevers and she is not coughing anything up.    atient was seen in the ER in [place] in early August for a fall and a headache.    Elements of this note were created using speech recognition software.  As such, errors of speech recognition may be present.    Patient is a 41 y.o. female presenting with sickle cell disease. The history is provided by the patient.   Sickle Cell Crisis    Associated symptoms include chest pain. Pertinent negatives include no fever, no headaches, no abdominal pain, no dysuria and no weakness.        No past medical history on file.    No past surgical history on file.      No family history on file.    Social History     Social History   ??? Marital status: SINGLE     Spouse name: N/A   ??? Number of children: N/A   ??? Years of education: N/A     Occupational History   ??? Not on file.     Social History Main Topics   ??? Smoking status: Not on file   ??? Smokeless tobacco: Not on file   ??? Alcohol use Not on file   ??? Drug use: Not on file   ??? Sexual activity: Not on file     Other Topics Concern   ??? Not on file     Social History Narrative         ALLERGIES: Demerol [meperidine]; Fentanyl; Morphine; and Oxycontin [oxycodone]    Review of Systems   Constitutional: Negative for chills, diaphoresis and fever.   HENT: Negative for congestion, rhinorrhea and sore throat.     Eyes: Negative for redness and visual disturbance.   Respiratory: Negative for cough, chest tightness, shortness of breath and wheezing.    Cardiovascular: Positive for chest pain. Negative for palpitations.   Gastrointestinal: Negative for abdominal pain, blood in stool, diarrhea, nausea and vomiting.   Endocrine: Negative for polydipsia and polyuria.   Genitourinary: Negative for dysuria and hematuria.   Musculoskeletal: Positive for arthralgias, back pain and myalgias. Negative for neck stiffness.   Skin: Negative for rash.   Allergic/Immunologic: Negative for environmental allergies and food allergies.   Neurological: Negative for dizziness, weakness and headaches.   Hematological: Negative for adenopathy. Does not bruise/bleed easily.   Psychiatric/Behavioral: Negative for confusion and sleep disturbance. The patient is not nervous/anxious.        Vitals:    08/10/17 1836   BP: 121/81   Pulse: 84   Resp: 18   Temp: 98.6 ??F (37 ??C)   SpO2: 100%   Weight: 66.2 kg (146 lb)   Height:  (1.727 m)  Physical Exam   Constitutional: She is oriented to person, place, and time. She appears well-developed and well-nourished.   HENT:   Head: Normocephalic and atraumatic.   Eyes: Conjunctivae and EOM are normal. Pupils are equal, round, and reactive to light.   Neck: Normal range of motion.   Cardiovascular: Normal rate and regular rhythm.    Pulmonary/Chest: Effort normal and breath sounds normal. No respiratory distress. She has no wheezes. She has no rales. She exhibits no tenderness.   Abdominal: Soft. Bowel sounds are normal. There is no rebound and no guarding.   Musculoskeletal: Normal range of motion. She exhibits no edema or tenderness.   Lymphadenopathy:     She has no cervical adenopathy.   Neurological: She is alert and oriented to person, place, and time.   Skin: Skin is warm and dry.   Psychiatric: She has a normal mood and affect.   Nursing note and vitals reviewed.       MDM   Number of Diagnoses or Management Options  Diagnosis management comments: I will treat with some IV Benadryl, Dilaudid, and Zofran.        ED Course       Procedures

## 2017-08-10 NOTE — ED Triage Notes (Signed)
Pt reports possible sickle cell crisis, reports pain everywhere

## 2017-08-10 NOTE — ED Notes (Signed)
Pt resting in bed. Pt sts pain is still at 9/10. Pt complaining of increased itching and is requesting more benadryl. RN notified Dr Thomasena Edisollins. Pt provided with ice chips and a warm blanket upon request.

## 2017-08-10 NOTE — ED Notes (Signed)
Pt requesting medication for nausea. Pt expresses pain is now down to a 7/10

## 2017-08-11 MED ORDER — PROMETHAZINE 25 MG/ML INJECTION
25 mg/mL | INTRAMUSCULAR | Status: AC
Start: 2017-08-11 — End: 2017-08-10
  Administered 2017-08-11: 03:00:00 via INTRAMUSCULAR

## 2017-08-11 MED ORDER — DIPHENHYDRAMINE HCL 50 MG/ML IJ SOLN
50 mg/mL | INTRAMUSCULAR | Status: AC
Start: 2017-08-11 — End: 2017-08-10
  Administered 2017-08-11: 01:00:00 via INTRAVENOUS

## 2017-08-11 MED ORDER — HYDROMORPHONE (PF) 1 MG/ML IJ SOLN
1 mg/mL | INTRAMUSCULAR | Status: AC
Start: 2017-08-11 — End: 2017-08-10
  Administered 2017-08-11: 01:00:00 via INTRAVENOUS

## 2017-08-11 MED FILL — HYDROMORPHONE (PF) 1 MG/ML IJ SOLN: 1 mg/mL | INTRAMUSCULAR | Qty: 1

## 2017-08-11 MED FILL — DIPHENHYDRAMINE HCL 50 MG/ML IJ SOLN: 50 mg/mL | INTRAMUSCULAR | Qty: 1

## 2017-08-11 MED FILL — PROMETHAZINE 25 MG/ML INJECTION: 25 mg/mL | INTRAMUSCULAR | Qty: 1

## 2017-08-15 ENCOUNTER — Emergency Department: Admit: 2017-08-15 | Payer: MEDICARE | Primary: Family Medicine

## 2017-08-15 DIAGNOSIS — M79605 Pain in left leg: Secondary | ICD-10-CM | POA: Diagnosis not present

## 2017-08-15 DIAGNOSIS — F329 Major depressive disorder, single episode, unspecified: Secondary | ICD-10-CM | POA: Diagnosis not present

## 2017-08-15 DIAGNOSIS — Z86718 Personal history of other venous thrombosis and embolism: Secondary | ICD-10-CM | POA: Diagnosis not present

## 2017-08-15 DIAGNOSIS — G43909 Migraine, unspecified, not intractable, without status migrainosus: Secondary | ICD-10-CM | POA: Diagnosis not present

## 2017-08-15 DIAGNOSIS — R791 Abnormal coagulation profile: Secondary | ICD-10-CM | POA: Diagnosis not present

## 2017-08-15 DIAGNOSIS — G40909 Epilepsy, unspecified, not intractable, without status epilepticus: Secondary | ICD-10-CM | POA: Diagnosis not present

## 2017-08-15 DIAGNOSIS — Z7901 Long term (current) use of anticoagulants: Secondary | ICD-10-CM | POA: Diagnosis not present

## 2017-08-15 DIAGNOSIS — M7989 Other specified soft tissue disorders: Secondary | ICD-10-CM | POA: Diagnosis not present

## 2017-08-15 DIAGNOSIS — R079 Chest pain, unspecified: Secondary | ICD-10-CM | POA: Diagnosis not present

## 2017-08-15 DIAGNOSIS — R600 Localized edema: Secondary | ICD-10-CM | POA: Diagnosis not present

## 2017-08-15 DIAGNOSIS — J984 Other disorders of lung: Secondary | ICD-10-CM | POA: Diagnosis not present

## 2017-08-15 DIAGNOSIS — M79602 Pain in left arm: Secondary | ICD-10-CM | POA: Diagnosis not present

## 2017-08-15 DIAGNOSIS — K5900 Constipation, unspecified: Secondary | ICD-10-CM | POA: Diagnosis not present

## 2017-08-15 DIAGNOSIS — D5700 Hb-SS disease with crisis, unspecified: Secondary | ICD-10-CM | POA: Diagnosis not present

## 2017-08-15 DIAGNOSIS — Z79899 Other long term (current) drug therapy: Secondary | ICD-10-CM | POA: Diagnosis not present

## 2017-08-15 DIAGNOSIS — Z8673 Personal history of transient ischemic attack (TIA), and cerebral infarction without residual deficits: Secondary | ICD-10-CM | POA: Diagnosis not present

## 2017-08-15 LAB — METABOLIC PANEL, COMPREHENSIVE
A-G Ratio: 0.8 — ABNORMAL LOW (ref 1.2–3.5)
ALT (SGPT): 39 U/L (ref 12–65)
AST (SGOT): 43 U/L — ABNORMAL HIGH (ref 15–37)
Albumin: 4 g/dL (ref 3.5–5.0)
Alk. phosphatase: 134 U/L (ref 50–136)
Anion gap: 9 mmol/L (ref 7–16)
BUN: 5 MG/DL — ABNORMAL LOW (ref 6–23)
Bilirubin, total: 1.1 MG/DL (ref 0.2–1.1)
CO2: 25 mmol/L (ref 21–32)
Calcium: 9.3 MG/DL (ref 8.3–10.4)
Chloride: 106 mmol/L (ref 98–107)
Creatinine: 0.88 MG/DL (ref 0.6–1.0)
GFR est AA: >60 mL/min/{1.73_m2} (ref >60)
GFR est non-AA: >60 mL/min/{1.73_m2} (ref >60)
Globulin: 4.8 g/dL — ABNORMAL HIGH (ref 2.3–3.5)
Glucose: 83 mg/dL (ref 65–100)
Potassium: 3.5 mmol/L (ref 3.5–5.1)
Protein, total: 8.8 g/dL — ABNORMAL HIGH (ref 6.3–8.2)
Sodium: 140 mmol/L (ref 136–145)

## 2017-08-15 LAB — CBC WITH AUTOMATED DIFF
ABS. BASOPHILS: 0 10*3/uL (ref 0.0–0.2)
ABS. EOSINOPHILS: 0 10*3/uL (ref 0.0–0.8)
ABS. IMM. GRANS.: 0 10*3/uL (ref 0.0–0.5)
ABS. LYMPHOCYTES: 2.9 10*3/uL (ref 0.5–4.6)
ABS. MONOCYTES: 1 10*3/uL (ref 0.1–1.3)
ABS. NEUTROPHILS: 4.1 10*3/uL (ref 1.7–8.2)
ABSOLUTE NRBC: 0.12 10*3/uL (ref 0.0–0.2)
BASOPHILS: 0 % (ref 0.0–2.0)
EOSINOPHILS: 0 % — ABNORMAL LOW (ref 0.5–7.8)
HCT: 29.6 % — ABNORMAL LOW (ref 35.8–46.3)
HGB: 10.4 g/dL — ABNORMAL LOW (ref 11.7–15.4)
IMMATURE GRANULOCYTES: 0 % (ref 0.0–5.0)
LYMPHOCYTES: 36 % (ref 13–44)
MCH: 41.6 PG — ABNORMAL HIGH (ref 26.1–32.9)
MCHC: 35.1 g/dL — ABNORMAL HIGH (ref 31.4–35.0)
MCV: 118.4 FL — ABNORMAL HIGH (ref 79.6–97.8)
MONOCYTES: 13 % — ABNORMAL HIGH (ref 4.0–12.0)
MPV: 9.2 FL — ABNORMAL LOW (ref 9.4–12.3)
NEUTROPHILS: 50 % (ref 43–78)
PLATELET: 567 10*3/uL — ABNORMAL HIGH (ref 150–450)
RBC: 2.5 M/uL — ABNORMAL LOW (ref 4.05–5.2)
RDW: 15.9 %
WBC: 8.1 10*3/uL (ref 4.3–11.1)

## 2017-08-15 LAB — TROPONIN I: Troponin-I, Qt.: <0.02 NG/ML — ABNORMAL LOW (ref 0.02–0.05)

## 2017-08-15 LAB — RETICULOCYTE COUNT
Absolute Retic Cnt.: 0.1715 M/ul — ABNORMAL HIGH (ref 0.026–0.095)
Immature Retic Fraction: 27.5 % — ABNORMAL HIGH (ref 3.0–15.9)
Retic Hgb Conc.: 35 pg (ref 29–35)
Reticulocyte count: 6.9 % — ABNORMAL HIGH (ref 0.3–2.0)

## 2017-08-15 NOTE — ED Provider Notes (Signed)
HPI Comments: Presents with complaint of chest back neck and shoulder pain consistent with her sickle cell disease.  She states she got hit in the chest with the door and made it worse.  She is been having a sickle cell crisis for the past week and was seen in the ER.  She states she is out of her home Dilaudid.  [place] here [place].  Still taking Xarelto.      Patient is a 41 y.o. female presenting with chest pain. The history is provided by the patient.   Chest Pain    This is a new problem. The current episode started 12 to 24 hours ago. The problem has been gradually worsening. The problem occurs constantly. The pain is present in the substernal region. The pain is severe. The quality of the pain is described as sharp. Radiates to: neck shoulders and back. Associated symptoms include back pain. Pertinent negatives include no cough, no diaphoresis, no dizziness, no fever, no nausea, no shortness of breath and no vomiting. She has tried nothing for the symptoms. Her past medical history is significant for DVT.       History reviewed. No pertinent past medical history.    History reviewed. No pertinent surgical history.      History reviewed. No pertinent family history.    Social History     Social History   ??? Marital status: SINGLE     Spouse name: N/A   ??? Number of children: N/A   ??? Years of education: N/A     Occupational History   ??? Not on file.     Social History Main Topics   ??? Smoking status: Not on file   ??? Smokeless tobacco: Not on file   ??? Alcohol use Not on file   ??? Drug use: Not on file   ??? Sexual activity: Not on file     Other Topics Concern   ??? Not on file     Social History Narrative   ??? No narrative on file         ALLERGIES: Demerol [meperidine]; Fentanyl; Morphine; and Oxycontin [oxycodone]    Review of Systems   Constitutional: Negative for chills, diaphoresis and fever.   Respiratory: Negative for cough and shortness of breath.     Cardiovascular: Positive for chest pain.   Gastrointestinal: Negative for nausea and vomiting.   Musculoskeletal: Positive for back pain.   Neurological: Negative for dizziness.   All other systems reviewed and are negative.      Vitals:    08/15/17 1849   BP: 127/86   Pulse: 95   Resp: 18   Temp: 98.3 ??F (36.8 ??C)   SpO2: 95%   Weight: 79.8 kg (176 lb)   Height: 5\' 8"  (1.727 m)            Physical Exam   Constitutional: She is oriented to person, place, and time. She appears well-developed and well-nourished. No distress.   HENT:   Head: Normocephalic and atraumatic.   Neck: Normal range of motion. Neck supple.   Cardiovascular: Normal rate and regular rhythm.    Pulmonary/Chest: Effort normal and breath sounds normal. No respiratory distress. She has no wheezes. She has no rales. She exhibits tenderness.   Abdominal: Soft. She exhibits no distension. There is no tenderness. There is no rebound and no guarding.   Musculoskeletal: Normal range of motion. She exhibits no edema.   Neurological: She is alert  and oriented to person, place, and time. No cranial nerve deficit. Coordination normal.   Skin: Skin is warm and dry. No rash noted. She is not diaphoretic. No erythema.   Psychiatric: She has a normal mood and affect. Her behavior is normal.   Nursing note and vitals reviewed.       MDM  Number of Diagnoses or Management Options  Sickle cell crisis [facility]):   Diagnosis management comments: Given 2 of Dilaudid and writhing in pain.  Patient feels like she needs to stay in the hospital.  Admitted to the hospitalist.       Amount and/or Complexity of Data Reviewed  Clinical lab tests: ordered and reviewed  Independent visualization of images, tracings, or specimens: yes (SR no ST elevation normal QRS)    Risk of Complications, Morbidity, and/or Mortality  Presenting problems: high  Diagnostic procedures: moderate  Management options: high    Patient Progress  Patient progress: improved        ED Course        Procedures

## 2017-08-15 NOTE — ED Triage Notes (Signed)
Pt reports chest pain and sickle cell crisis for a week.

## 2017-08-15 NOTE — Progress Notes (Signed)
TRANSFER - IN REPORT:    Verbal report received from Summer RN(name) on [name] Campanileriscilla Bright  being received from ER(unit) for routine progression of care      Report consisted of patient???s Situation, Background, Assessment and   Recommendations(SBAR).     Information from the following report(s) SBAR was reviewed with the receiving nurse.    Opportunity for questions and clarification was provided.      Assessment completed upon patient???s arrival to unit and care assumed.

## 2017-08-15 NOTE — ED Notes (Signed)
TRANSFER - OUT REPORT:    Verbal report given to Reba, RN (name) on [name] Bright  being transferred to 603 (unit) for routine progression of care       Report consisted of patient???s Situation, Background, Assessment and   Recommendations(SBAR).     Information from the following report(s) SBAR, ED Summary, Procedure Summary, Intake/Output, MAR and Recent Results was reviewed with the receiving nurse.    Lines:   Peripheral IV 08/15/17 Left Wrist (Active)   Site Assessment Clean, dry, & intact 08/15/2017 11:53 PM   Phlebitis Assessment 0 08/15/2017 11:53 PM   Infiltration Assessment 0 08/15/2017 11:53 PM   Dressing Status Clean, dry, & intact 08/15/2017 11:53 PM   Dressing Type Transparent 08/15/2017 11:53 PM        Opportunity for questions and clarification was provided.      Patient transported with:   [employer]

## 2017-08-16 ENCOUNTER — Inpatient Hospital Stay
Admit: 2017-08-16 | Discharge: 2017-08-20 | Disposition: A | Discharge status: Home / Self Care | Payer: MEDICARE | Attending: Internal Medicine | Admitting: Internal Medicine

## 2017-08-16 DIAGNOSIS — D5700 Hb-SS disease with crisis, unspecified: Principal | ICD-10-CM

## 2017-08-16 LAB — METABOLIC PANEL, COMPREHENSIVE
A-G Ratio: 0.9 — ABNORMAL LOW (ref 1.2–3.5)
ALT (SGPT): 34 U/L (ref 12–65)
AST (SGOT): 41 U/L — ABNORMAL HIGH (ref 15–37)
Albumin: 3.5 g/dL (ref 3.5–5.0)
Alk. phosphatase: 113 U/L (ref 50–136)
Anion gap: 9 mmol/L (ref 7–16)
BUN: 4 MG/DL — ABNORMAL LOW (ref 6–23)
Bilirubin, total: 1.2 MG/DL — ABNORMAL HIGH (ref 0.2–1.1)
CO2: 23 mmol/L (ref 21–32)
Calcium: 8.7 MG/DL (ref 8.3–10.4)
Chloride: 107 mmol/L (ref 98–107)
Creatinine: 0.76 MG/DL (ref 0.6–1.0)
GFR est AA: >60 mL/min/{1.73_m2} (ref >60)
GFR est non-AA: >60 mL/min/{1.73_m2} (ref >60)
Globulin: 4 g/dL — ABNORMAL HIGH (ref 2.3–3.5)
Glucose: 124 mg/dL — ABNORMAL HIGH (ref 65–100)
Potassium: 4 mmol/L (ref 3.5–5.1)
Protein, total: 7.5 g/dL (ref 6.3–8.2)
Sodium: 139 mmol/L (ref 136–145)

## 2017-08-16 LAB — RETICULOCYTE COUNT
Absolute Retic Cnt.: 0.1421 M/ul — ABNORMAL HIGH (ref 0.026–0.095)
Immature Retic Fraction: 29.4 % — ABNORMAL HIGH (ref 3.0–15.9)
Retic Hgb Conc.: 33 pg (ref 29–35)
Reticulocyte count: 6.8 % — ABNORMAL HIGH (ref 0.3–2.0)

## 2017-08-16 LAB — EKG, 12 LEAD, INITIAL
Atrial Rate: 89 {beats}/min
Calculated P Axis: 65 degrees
Calculated R Axis: 40 degrees
Calculated T Axis: 55 degrees
Diagnosis: NORMAL
P-R Interval: 158 ms
Q-T Interval: 346 ms
QRS Duration: 76 ms
QTC Calculation (Bezet): 420 ms
Ventricular Rate: 89 {beats}/min

## 2017-08-16 LAB — DRUG SCREEN, URINE
AMPHETAMINES: NEGATIVE
BARBITURATES: NEGATIVE
BENZODIAZEPINES: NEGATIVE
COCAINE: NEGATIVE
METHADONE: NEGATIVE
OPIATES: POSITIVE
PCP(PHENCYCLIDINE): NEGATIVE
THC (TH-CANNABINOL): NEGATIVE

## 2017-08-16 LAB — EKG, 12 LEAD, SUBSEQUENT
Atrial Rate: 61 {beats}/min
Calculated P Axis: 24 degrees
Calculated R Axis: 29 degrees
Calculated T Axis: 22 degrees
Diagnosis: NORMAL
P-R Interval: 180 ms
Q-T Interval: 400 ms
QRS Duration: 84 ms
QTC Calculation (Bezet): 402 ms
Ventricular Rate: 61 {beats}/min

## 2017-08-16 LAB — URINALYSIS W/ RFLX MICROSCOPIC
Bilirubin: NEGATIVE
Glucose: NEGATIVE mg/dL
Ketone: NEGATIVE mg/dL
Leukocyte Esterase: NEGATIVE
Nitrites: NEGATIVE
Protein: NEGATIVE mg/dL
Specific gravity: 1.012 (ref 1.001–1.023)
Urobilinogen: 0.2 EU/dL (ref 0.2–1.0)
pH (UA): 6.5 (ref 5.0–9.0)

## 2017-08-16 LAB — CBC W/O DIFF
ABSOLUTE NRBC: 0.09 10*3/uL (ref 0.0–0.2)
HCT: 24.4 % — ABNORMAL LOW (ref 35.8–46.3)
HGB: 8.4 g/dL — ABNORMAL LOW (ref 11.7–15.4)
MCH: 40.4 PG — ABNORMAL HIGH (ref 26.1–32.9)
MCHC: 34.4 g/dL (ref 31.4–35.0)
MCV: 117.3 FL — ABNORMAL HIGH (ref 79.6–97.8)
MPV: 9.4 FL (ref 9.4–12.3)
PLATELET: 448 10*3/uL (ref 150–450)
RBC: 2.08 M/uL — ABNORMAL LOW (ref 4.05–5.2)
RDW: 16.9 %
WBC: 8.1 10*3/uL (ref 4.3–11.1)

## 2017-08-16 LAB — LD: LD: 362 U/L — ABNORMAL HIGH (ref 100–190)

## 2017-08-16 LAB — MAGNESIUM: Magnesium: 2.1 mg/dL (ref 1.8–2.4)

## 2017-08-16 LAB — TROPONIN I: Troponin-I, Qt.: <0.02 NG/ML — ABNORMAL LOW (ref 0.02–0.05)

## 2017-08-16 LAB — EKG 12-LEAD
Atrial Rate: 89 {beats}/min
Atrial Rate: 61 {beats}/min
Diagnosis: NORMAL
Diagnosis: NORMAL
P Axis: 65 degrees
P Axis: 24 degrees
P-R Interval: 158 ms
P-R Interval: 180 ms
Q-T Interval: 346 ms
Q-T Interval: 400 ms
QRS Duration: 76 ms
QRS Duration: 84 ms
QTc Calculation (Bazett): 420 ms
QTc Calculation (Bazett): 402 ms
R Axis: 40 degrees
R Axis: 29 degrees
T Axis: 55 degrees
T Axis: 22 degrees
Ventricular Rate: 89 {beats}/min
Ventricular Rate: 61 {beats}/min

## 2017-08-16 MED ORDER — HYDROMORPHONE (PF) 1 MG/ML IJ SOLN
1 mg/mL | INTRAMUSCULAR | Status: DC | PRN
Start: 2017-08-16 — End: 2017-08-16
  Administered 2017-08-16: 02:00:00 via INTRAVENOUS

## 2017-08-16 MED ORDER — LEVETIRACETAM 500 MG TAB
500 mg | Freq: Two times a day (BID) | ORAL | Status: DC
Start: 2017-08-16 — End: 2017-08-20
  Administered 2017-08-16 – 2017-08-20 (×10): via ORAL

## 2017-08-16 MED ORDER — ONDANSETRON (PF) 4 MG/2 ML INJECTION
4 mg/2 mL | Freq: Four times a day (QID) | INTRAMUSCULAR | Status: DC | PRN
Start: 2017-08-16 — End: 2017-08-20
  Administered 2017-08-16 – 2017-08-19 (×7): via INTRAVENOUS

## 2017-08-16 MED ORDER — HYDROXYUREA 500 MG CAPSULE
500 mg | Freq: Every day | ORAL | Status: DC
Start: 2017-08-16 — End: 2017-08-20
  Administered 2017-08-16 – 2017-08-20 (×5): via ORAL

## 2017-08-16 MED ORDER — FOLIC ACID 1 MG TAB
1 mg | Freq: Every day | ORAL | Status: DC
Start: 2017-08-16 — End: 2017-08-20
  Administered 2017-08-16 – 2017-08-20 (×5): via ORAL

## 2017-08-16 MED ORDER — SODIUM CHLORIDE 0.45 % IV
0.45 % | INTRAVENOUS | Status: DC
Start: 2017-08-16 — End: 2017-08-16
  Administered 2017-08-16 (×2): via INTRAVENOUS

## 2017-08-16 MED ORDER — HYDROMORPHONE (PF) 1 MG/ML IJ SOLN
1 mg/mL | INTRAMUSCULAR | Status: DC | PRN
Start: 2017-08-16 — End: 2017-08-18
  Administered 2017-08-16 – 2017-08-18 (×12): via INTRAVENOUS

## 2017-08-16 MED ORDER — FAMOTIDINE (PF) 20 MG/2 ML IV
20 mg/2 mL | Freq: Two times a day (BID) | INTRAVENOUS | Status: DC
Start: 2017-08-16 — End: 2017-08-20
  Administered 2017-08-16 – 2017-08-20 (×9): via INTRAVENOUS

## 2017-08-16 MED ORDER — HYDROMORPHONE (PF) 1 MG/ML IJ SOLN
1 mg/mL | INTRAMUSCULAR | Status: AC | PRN
Start: 2017-08-16 — End: 2017-08-15
  Administered 2017-08-16 (×2): via INTRAVENOUS

## 2017-08-16 MED ORDER — ONDANSETRON (PF) 4 MG/2 ML INJECTION
4 mg/2 mL | INTRAMUSCULAR | Status: AC
Start: 2017-08-16 — End: 2017-08-15
  Administered 2017-08-16: 01:00:00 via INTRAVENOUS

## 2017-08-16 MED ORDER — DEXTROSE 5%-1/2 NORMAL SALINE IV
INTRAVENOUS | Status: DC
Start: 2017-08-16 — End: 2017-08-17
  Administered 2017-08-16 – 2017-08-17 (×3): via INTRAVENOUS

## 2017-08-16 MED ORDER — DIPHENHYDRAMINE HCL 50 MG/ML IJ SOLN
50 mg/mL | INTRAMUSCULAR | Status: AC
Start: 2017-08-16 — End: 2017-08-15
  Administered 2017-08-16: 01:00:00 via INTRAVENOUS

## 2017-08-16 MED ORDER — RIVAROXABAN 20 MG TAB
20 mg | Freq: Every day | ORAL | Status: DC
Start: 2017-08-16 — End: 2017-08-20
  Administered 2017-08-16 – 2017-08-20 (×5): via ORAL

## 2017-08-16 MED ORDER — LACOSAMIDE 100 MG TAB
100 mg | Freq: Two times a day (BID) | ORAL | Status: DC
Start: 2017-08-16 — End: 2017-08-20
  Administered 2017-08-16 – 2017-08-20 (×9): via ORAL

## 2017-08-16 MED ORDER — DIPHENHYDRAMINE 25 MG CAP
25 mg | Freq: Four times a day (QID) | ORAL | Status: DC | PRN
Start: 2017-08-16 — End: 2017-08-20
  Administered 2017-08-16 – 2017-08-17 (×3): via ORAL

## 2017-08-16 MED ORDER — ACETAMINOPHEN 325 MG TABLET
325 mg | Freq: Four times a day (QID) | ORAL | Status: DC | PRN
Start: 2017-08-16 — End: 2017-08-20

## 2017-08-16 MED ORDER — OXYCODONE 5 MG TAB
5 mg | ORAL | Status: DC | PRN
Start: 2017-08-16 — End: 2017-08-20
  Administered 2017-08-18 – 2017-08-20 (×5): via ORAL

## 2017-08-16 MED ORDER — SODIUM CHLORIDE 0.9% BOLUS IV
0.9 % | Freq: Once | INTRAVENOUS | Status: AC
Start: 2017-08-16 — End: 2017-08-15
  Administered 2017-08-16: 01:00:00 via INTRAVENOUS

## 2017-08-16 MED FILL — XARELTO 20 MG TABLET: 20 mg | ORAL | Qty: 1

## 2017-08-16 MED FILL — HYDROXYUREA 500 MG CAPSULE: 500 mg | ORAL | Qty: 2

## 2017-08-16 MED FILL — HYDROMORPHONE (PF) 1 MG/ML IJ SOLN: 1 mg/mL | INTRAMUSCULAR | Qty: 1

## 2017-08-16 MED FILL — VIMPAT 100 MG TABLET: 100 mg | ORAL | Qty: 1

## 2017-08-16 MED FILL — FOLIC ACID 1 MG TAB: 1 mg | ORAL | Qty: 1

## 2017-08-16 MED FILL — FAMOTIDINE (PF) 20 MG/2 ML IV: 20 mg/2 mL | INTRAVENOUS | Qty: 2

## 2017-08-16 MED FILL — ONDANSETRON (PF) 4 MG/2 ML INJECTION: 4 mg/2 mL | INTRAMUSCULAR | Qty: 2

## 2017-08-16 MED FILL — SODIUM CHLORIDE 0.45 % IV: 0.45 % | INTRAVENOUS | Qty: 1000

## 2017-08-16 MED FILL — DIPHENHYDRAMINE HCL 50 MG/ML IJ SOLN: 50 mg/mL | INTRAMUSCULAR | Qty: 1

## 2017-08-16 MED FILL — DEXTROSE 5%-1/2 NORMAL SALINE IV: INTRAVENOUS | Qty: 1000

## 2017-08-16 MED FILL — DIPHENHYDRAMINE 25 MG CAP: 25 mg | ORAL | Qty: 1

## 2017-08-16 MED FILL — LEVETIRACETAM 500 MG TAB: 500 mg | ORAL | Qty: 3

## 2017-08-16 NOTE — Consults (Signed)
Consults by Lannie Fields, NP at 08/16/17 1237                Author: Lannie Fields, NP  Service: Nurse Practitioner  Author Type: Nurse Practitioner       Filed: 08/16/17 4403  Date of Service: 08/16/17 1237  Status: Attested           Editor: Lannie Fields, NP (Nurse Practitioner)  Cosigner: Rayann Heman, MD at 08/16/17 2137            Consult Orders        1. IP CONSULT TO HEMATOLOGY [[id number]] ordered by Calton Golds, Donna Bernard, MD at 08/15/17 2325                         Attestation signed by Rayann Heman, MD at 08/16/17 2137          Attending Addendum:   I personally evaluated the patient with Lannie Fields, N.P.,  and agree with the assessment, findings and plan as documented.  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  She presents with what she  indicates is a conventional vaso-occlusive crisis for her with pain primarily in her back and legs.  She has had no fever or signs of infection.  There are no signs of the acute chest syndrome.  She is maintained on Hydrea and folate and has a relatively  high hemoglobin raising the question as to whether or not this is truly sickle cell disease or some other heterozygous sickle hemoglobinopathy.  Regardless, the treatment is the same.  Continue hydration and pain medications as needed.  The patient indicates  she is typically only hospitalized once or twice per year.                Rayann Heman MD FACP      Oncology and Hematology Program Director   [place] [address]   P [phone]   F [phone]   [email or web address]         Elements of this note have been dictated using speech recognition software. As a result, errors of speech recognition may have occurred.                                            [facility]          Inpatient Hematology / Oncology Consult Note      Reason for Consult:  Sickle cell  disease [facility])   Referring Physician:  Dewayne Hatch, *      History of Present Illness:   Ms. Whitmyer is a  41 y.o. female admitted on 08/15/2017  with a primary diagnosis of The encounter diagnosis was Sickle cell crisis ([place])..        Ms. 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 She came into  ER 08/15/2017 in crisis with pain to back, neck, shoulder, and chest. She was here one week ago for similar symptoms and was treated with pain medication then discharged. She is a  patient of Dr. Soledad Gerlach at [facility]. She has also seen Dr. Yolanda Bonine at [facility]  a few years ago as well as a physician in [place]. According to notes, she does not have a home and therefore is a traveler which makes her compliance with medical treatment and appointments very poor. She is here in [facility] visiting her father in [place].  She denied fever, chills, cough, abdominal pain or diarrhea. When she arrived into the emergency room her vital signs were blood pressure of 127/86, heart rate 95, respiratory rate of 18, O2 saturation of 95%. ER initial blood workup reported a normal  white blood cell count, stable hemoglobin level 10.4, reticulocyte count of 6.9, normal electrolytes, normal kidney function, normal troponins, normal liver enzymes.  A chest x-ray reported a minor left mid lung zone scaring and blunting of the left  costophrenic angle which is a chronic finding. Her EKG was seen and reported normal sinus rhythm with no evidence of acute ischemia.  The patient received 1 liter of normal saline in the emergency room, she received IV Dilaudid and then admitted for  management of her crisis. We are consulted for recommendations.       Review of Systems:      Constitutional  Denies fever, chills, weight loss, appetite changes, fatigue        HEENT   Denies trauma, blurry vision, hearing loss, ear pain, nosebleeds, sore throat, neck pain and ear discharge.         Skin  Denies lesions or rashes.     Lungs  Denies cough, sputum production or hemoptysis. SOBOE     Cardiovascular  Denies chest pain, palpitations, or lower extremity edema.     Gastrointestinal  Denies nausea, vomiting, changes in bowel habits,  abdominal pain.     Neuro  Denies headaches, visual changes or ataxia. Denies dizziness, tingling, tremors, sensory change, speech change, focal weakness or headaches.        MSK  Generalized pain back, shoulders, legs        Psychiatric/Behavioral  The patient is not nervous/anxious.                Allergies        Allergen  Reactions         ?  Demerol [Meperidine]  Seizures     ?  Fentanyl  Other (comments)             "my doctor said I had a stroke from it"         ?  Morphine  Itching         ?  Oxycontin [Oxycodone]  Vertigo        History reviewed. No pertinent past medical history.   History reviewed. No pertinent surgical history.   History reviewed. No pertinent family history.     Social History          Social History         ?  Marital status:  SINGLE              Spouse name:  N/A         ?  Number of children:  N/A         ?  Years of education:  N/A          Occupational History        ?  Not on file.          Social History Main  Topics         ?  Smoking status:  Not on file     ?  Smokeless tobacco:  Not on file     ?  Alcohol use  Not on file     ?  Drug use:  Not on file         ?  Sexual activity:  Not on file           Other Topics  Concern        ?  Not on file          Social History Narrative        ?  No narrative on file          Current Facility-Administered Medications             Medication  Dose  Route  Frequency  Provider  Last Rate  Last Dose              ?  folic acid (FOLVITE) tablet 1 mg   1 mg  Oral  DAILY  Jose R Calton Golds, MD     1 mg at 08/16/17 0940     ?  hydroxyurea (HYDREA) chemo cap 1,000 mg   1,000 mg  Oral   DAILY  Dewayne Hatch, MD     1,000 mg at 08/16/17 0940     ?  lacosamide (VIMPAT) tablet 100 mg   100 mg  Oral  BID  Dewayne Hatch, MD     100 mg at 08/16/17 0940     ?  levETIRAcetam (KEPPRA) tablet 1,500 mg   1,500 mg  Oral  BID  Dewayne Hatch, MD     1,500 mg at 08/16/17 0940     ?  rivaroxaban (XARELTO) tablet 20 mg   20 mg  Oral  DAILY WITH BREAKFAST  Jose R Calton Golds, MD     20 mg at 08/16/17 0940     ?  famotidine (PF) (PEPCID) 20 mg in sodium chloride 0.9% 10 mL injection   20 mg  IntraVENous  Q12H  Dewayne Hatch, MD     20 mg at 08/16/17 0944     ?  0.45% sodium chloride infusion   125 mL/hr  IntraVENous  CONTINUOUS  Dewayne Hatch, MD  125 mL/hr at 08/16/17 2841  125 mL/hr at 08/16/17 3244     ?  HYDROmorphone (PF) (DILAUDID) injection 1 mg   1 mg  IntraVENous  Q3H PRN  Dewayne Hatch, MD     1 mg at 08/16/17 0940     ?  diphenhydrAMINE (BENADRYL) capsule 25 mg   25 mg  Oral  Q6H PRN  Dewayne Hatch, MD     25 mg at 08/16/17 0257     ?  oxyCODONE IR (ROXICODONE) tablet 5 mg   5 mg  Oral  Q4H PRN  Dewayne Hatch, MD           ?  acetaminophen (TYLENOL) tablet 650 mg   650 mg  Oral  Q6H PRN  Dewayne Hatch, MD                    ?  ondansetron [facility]) injection 4 mg   4 mg  IntraVENous  Q6H PRN  Dewayne Hatch, MD     4 mg at 08/16/17 [phone]  OBJECTIVE:   Patient Vitals for the past 8 hrs:             BP  Temp  Pulse  Resp  SpO2  Weight     08/16/17 0807  97/65  97.8 ??F (36.6 ??C)  78  20  93 %  -     08/16/17 0451  101/59  97.6 ??F (36.4 ??C)  81  20  90 %  179 lb 4.8 oz (81.3 kg)        Temp (24hrs), Avg:97.9 ??F (36.6 ??C), Min:97.6 ??F (36.4 ??C), Max:98.3 ??F (36.8 ??C)             Physical Exam:      Constitutional:  Well developed, well nourished female in no acute  distress, sitting comfortably in the hospital bed.         HEENT:  Normocephalic and atraumatic. Oropharynx is clear, mucous membranes are moist.   Pupils are equal, round, and reactive to light. Extraocular muscles are intact.   Sclerae anicteric. Neck supple without JVD. No thyromegaly present.      Lymph node     No palpable submandibular, cervical, supraclavicular, axillary or inguinal lymph nodes.     Skin  Warm and dry.  No bruising and no rash noted.  No erythema.  No pallor.      Respiratory  Lungs are clear to auscultation bilaterally without wheezes, rales or rhonchi, normal air exchange without accessory muscle use.      CVS  Normal rate, regular rhythm and normal S1 and S2.  No murmurs, gallops, or rubs.     Abdomen  Soft, nontender and nondistended, normoactive bowel sounds.  No palpable mass.  No hepatosplenomegaly.     Neuro  Grossly nonfocal with no obvious sensory or motor deficits.     MSK  Normal range of motion in general.  No edema and no tenderness.     Psych  Appropriate mood and affect.            Labs:       Recent Results (from the past 24 hour(s))     EKG, 12 LEAD, INITIAL          Collection Time: 08/15/17  6:55 PM         Result  Value  Ref Range            Ventricular Rate  89  BPM       Atrial Rate  89  BPM       P-R Interval  158  ms       QRS Duration  76  ms       Q-T Interval  346  ms       QTC Calculation (Bezet)  420  ms       Calculated P Axis  65  degrees       Calculated R Axis  40  degrees       Calculated T Axis  55  degrees       Diagnosis                 Normal sinus rhythm   Normal ECG   No previous ECGs available   Confirmed by [facility]  MD (UC), MATTHEW G ([id number]) on 08/16/2017 6:58:25 AM          CBC WITH AUTOMATED DIFF          Collection Time: 08/15/17  6:58 PM  Result  Value  Ref Range            WBC  8.1  4.3 - 11.1 K/uL       RBC  2.50 (L)  4.05 - 5.2 M/uL       HGB  10.4 (L)  11.7 - 15.4 g/dL       HCT  29.6 (L)  35.8 - 46.3 %       MCV  118.4 (H)  79.6 - 97.8 FL       MCH  41.6 (H)  26.1 - 32.9 PG       MCHC  35.1 (H)  31.4 - 35.0 g/dL       RDW  15.9  %       PLATELET  567 (H)  150 - 450 K/uL        MPV  9.2 (L)  9.4 - 12.3 FL       ABSOLUTE NRBC  0.12  0.0 - 0.2 K/uL       DF  AUTOMATED          NEUTROPHILS  50  43 - 78 %       LYMPHOCYTES  36  13 - 44 %       MONOCYTES  13 (H)  4.0 - 12.0 %       EOSINOPHILS  0 (L)  0.5 - 7.8 %       BASOPHILS  0  0.0 - 2.0 %       IMMATURE GRANULOCYTES  0  0.0 - 5.0 %       ABS. NEUTROPHILS  4.1  1.7 - 8.2 K/UL       ABS. LYMPHOCYTES  2.9  0.5 - 4.6 K/UL       ABS. MONOCYTES  1.0  0.1 - 1.3 K/UL       ABS. EOSINOPHILS  0.0  0.0 - 0.8 K/UL       ABS. BASOPHILS  0.0  0.0 - 0.2 K/UL       ABS. IMM. GRANS.  0.0  0.0 - 0.5 K/UL       METABOLIC PANEL, COMPREHENSIVE          Collection Time: 08/15/17  6:58 PM         Result  Value  Ref Range            Sodium  140  136 - 145 mmol/L       Potassium  3.5  3.5 - 5.1 mmol/L       Chloride  106  98 - 107 mmol/L       CO2  25  21 - 32 mmol/L       Anion gap  9  7 - 16 mmol/L       Glucose  83  65 - 100 mg/dL       BUN  5 (L)  6 - 23 MG/DL       Creatinine  0.88  0.6 - 1.0 MG/DL       GFR est AA  >60  >60 ml/min/1.46m       GFR est non-AA  >60  >60 ml/min/1.738m      Calcium  9.3  8.3 - 10.4 MG/DL       Bilirubin, total  1.1  0.2 - 1.1 MG/DL       ALT (SGPT)  39  12 - 65 U/L       AST (SGOT)  43 (H)  15 - 37 U/L       Alk. phosphatase  134  50 - 136 U/L       Protein, total  8.8 (H)  6.3 - 8.2 g/dL       Albumin  4.0  3.5 - 5.0 g/dL       Globulin  4.8 (H)  2.3 - 3.5 g/dL       A-G Ratio  0.8 (L)  1.2 - 3.5         TROPONIN I          Collection Time: 08/15/17  6:58 PM         Result  Value  Ref Range            Troponin-I, Qt.  <0.02 (L)  0.02 - 0.05 NG/ML       RETICULOCYTE COUNT          Collection Time: 08/15/17  6:58 PM         Result  Value  Ref Range            Reticulocyte count  6.9 (H)  0.3 - 2.0 %       Absolute Retic Cnt.  0.1715 (H)  0.026 - 0.095 M/ul       Immature Retic Fraction  27.5 (H)  3.0 - 15.9 %       Retic Hgb Conc.  35  29 - 35 pg       CBC W/O DIFF          Collection Time: 08/16/17  6:03 AM         Result   Value  Ref Range            WBC  8.1  4.3 - 11.1 K/uL       RBC  2.08 (L)  4.05 - 5.2 M/uL       HGB  8.4 (L)  11.7 - 15.4 g/dL       HCT  24.4 (L)  35.8 - 46.3 %       MCV  117.3 (H)  79.6 - 97.8 FL       MCH  40.4 (H)  26.1 - 32.9 PG       MCHC  34.4  31.4 - 35.0 g/dL       RDW  16.9  %       PLATELET  448  150 - 450 K/uL       MPV  9.4  9.4 - 12.3 FL       ABSOLUTE NRBC  0.09  0.0 - 0.2 K/uL       METABOLIC PANEL, COMPREHENSIVE          Collection Time: 08/16/17  6:03 AM         Result  Value  Ref Range            Sodium  139  136 - 145 mmol/L       Potassium  4.0  3.5 - 5.1 mmol/L       Chloride  107  98 - 107 mmol/L       CO2  23  21 - 32 mmol/L       Anion gap  9  7 - 16 mmol/L       Glucose  124 (H)  65 - 100 mg/dL       BUN  4 (L)  6 - 23 MG/DL  Creatinine  0.76  0.6 - 1.0 MG/DL       GFR est AA  >60  >60 ml/min/1.25m       GFR est non-AA  >60  >60 ml/min/1.736m      Calcium  8.7  8.3 - 10.4 MG/DL       Bilirubin, total  1.2 (H)  0.2 - 1.1 MG/DL       ALT (SGPT)  34  12 - 65 U/L       AST (SGOT)  41 (H)  15 - 37 U/L       Alk. phosphatase  113  50 - 136 U/L       Protein, total  7.5  6.3 - 8.2 g/dL       Albumin  3.5  3.5 - 5.0 g/dL       Globulin  4.0 (H)  2.3 - 3.5 g/dL       A-G Ratio  0.9 (L)  1.2 - 3.5         LD          Collection Time: 08/16/17  6:03 AM         Result  Value  Ref Range            LD  362 (H)  100 - 190 U/L       MAGNESIUM          Collection Time: 08/16/17  6:03 AM         Result  Value  Ref Range            Magnesium  2.1  1.8 - 2.4 mg/dL       RETICULOCYTE COUNT          Collection Time: 08/16/17  6:03 AM         Result  Value  Ref Range            Reticulocyte count  6.8 (H)  0.3 - 2.0 %       Absolute Retic Cnt.  0.1421 (H)  0.026 - 0.095 M/ul       Immature Retic Fraction  29.4 (H)  3.0 - 15.9 %       Retic Hgb Conc.  33  29 - 35 pg       TROPONIN I          Collection Time: 08/16/17  6:03 AM         Result  Value  Ref Range            Troponin-I, Qt.  <0.02 (L)  0.02 -  0.05 NG/ML       DRUG SCREEN, URINE          Collection Time: 08/16/17  7:15 AM         Result  Value  Ref Range            PCP(PHENCYCLIDINE)  NEGATIVE           BENZODIAZEPINES  NEGATIVE           COCAINE  NEGATIVE           AMPHETAMINES  NEGATIVE           METHADONE  NEGATIVE           THC (TH-CANNABINOL)  NEGATIVE           OPIATES  POSITIVE          BARBITURATES  NEGATIVE  URINALYSIS W/ RFLX MICROSCOPIC          Collection Time: 08/16/17  7:15 AM         Result  Value  Ref Range            Color  YELLOW          Appearance  CLEAR          Specific gravity  1.012  1.001 - 1.023         pH (UA)  6.5  5.0 - 9.0         Protein  NEGATIVE   NEG mg/dL       Glucose  NEGATIVE   mg/dL       Ketone  NEGATIVE   NEG mg/dL       Bilirubin  NEGATIVE   NEG         Blood  LARGE (A)  NEG         Urobilinogen  0.2  0.2 - 1.0 EU/dL       Nitrites  NEGATIVE   NEG         Leukocyte Esterase  NEGATIVE   NEG         WBC  5-10  0 /hpf       RBC  3-5  0 /hpf       Epithelial cells  0-3  0 /hpf       Bacteria  TRACE  0 /hpf       Casts  0-3  0 /lpf       EKG, 12 LEAD, SUBSEQUENT          Collection Time: 08/16/17  9:09 AM         Result  Value  Ref Range            Ventricular Rate  61  BPM       Atrial Rate  61  BPM       P-R Interval  180  ms       QRS Duration  84  ms       Q-T Interval  400  ms       QTC Calculation (Bezet)  402  ms       Calculated P Axis  24  degrees       Calculated R Axis  29  degrees       Calculated T Axis  22  degrees       Diagnosis                 Normal sinus rhythm   Normal ECG   When compared with ECG of 15-Aug-2017 18:55,   Nonspecific T wave abnormality now evident in Inferior leads              Imaging:   '@[email or web address]' @      ASSESSMENT:      Problem List   Never Reviewed                Codes  Class  Noted             Sickle cell disease ([place])  ICD-10-CM: D57.1   ICD-9-CM: 282.60    08/15/2017                             ??   RECOMMENDATIONS:      Sickle cell disease with acute pain crisis   9/13  change  to D51/2 NS at 150/hr. Oxygen ordered at 2L. Continue hydrea and folic acid. On dilaudid IV 1 mg q 3 hours and Roxie 5 mg q 4. Hgb 8.4, Retic 6.8.       History of DVT/superior vena cava syndrome   9/13 already on Xarelto-continue      History of seizure disorder   9/13 Continue Keppra and Vimpat         Lab studies and imaging studies were personally reviewed.  Pertinent old records were reviewed.      Thank you for allowing [place] to participate in the care of Ms.  Hassell Done.  Agree with current plan. Her current living situation makes it difficult to arrange for follow up appointment. If she ever decides  to live locally she can follow up with Dr. Yolanda Bonine at [facility] where she has established relationship or she could be referred to our services. We will sign off. Please call with any questions.                Lannie Fields, NP    [facility]   [address] [place] [address]   Office : [phone]   Fax : [phone]

## 2017-08-16 NOTE — Progress Notes (Addendum)
08/16/17 0040   Dual Skin Pressure Injury Assessment   Dual Skin Pressure Injury Assessment WDL   Second Care Provider (Based on Facility Policy) Mardella LaymanLindsey RN   Skin Integumentary   Skin Integumentary (WDL) WDL   Skin Integrity Scars (comment)   Primary Nurse Orpah Meltereba M Miller, RN and Mardella LaymanLindsey, RN performed a dual skin assessment on this patient No impairment noted  Braden score is 22

## 2017-08-16 NOTE — Consults (Signed)
[facility] Hematology & Oncology        Inpatient Hematology / Oncology Consult Note    Reason for Consult:  Sickle cell disease [facility])  Referring Physician:  Dewayne Hatch, *    History of Present Illness:  [name] Bright is a 41 y.o. female admitted on 08/15/2017 with a primary diagnosis of The encounter diagnosis was Sickle cell crisis ([place])..      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 She came into ER 08/15/2017 in crisis with pain to back, neck, shoulder, and chest. She was here one week ago for similar symptoms and was treated with pain medication then discharged. She is a patient of Dr. Soledad Gerlach at [facility]. She has also seen Dr. Yolanda Bonine at [facility] a few years ago as well as a physician in [place]. According to notes, she does not have a home and therefore is a traveler which makes her compliance with medical treatment and appointments very poor. She is here in [facility] visiting her father in [place]. She denied fever, chills, cough, abdominal pain or diarrhea. When she arrived into the emergency room her vital signs were blood pressure of 127/86, heart rate 95, respiratory rate of 18, O2 saturation of 95%. ER initial blood workup reported a normal white blood cell count, stable hemoglobin level 10.4, reticulocyte count of 6.9, normal electrolytes, normal kidney function, normal troponins, normal liver enzymes.  A chest x-ray reported a minor left mid lung zone scaring and blunting of the left costophrenic angle which is a chronic finding. Her EKG was seen and reported normal sinus rhythm with no evidence of acute ischemia.  The patient received 1 liter of normal saline in the emergency room, she received IV Dilaudid and then admitted for management of her crisis. We are consulted for recommendations.      Review of Systems:  Constitutional Denies fever, chills, weight loss, appetite changes, fatigue   HEENT Denies trauma, blurry vision, hearing loss, ear pain, nosebleeds, sore throat, neck pain and ear discharge.    Skin Denies lesions or rashes.   Lungs Denies cough, sputum production or hemoptysis. SOBOE   Cardiovascular Denies chest pain, palpitations, or lower extremity edema.   Gastrointestinal Denies nausea, vomiting, changes in bowel habits,  abdominal pain.   Neuro Denies headaches, visual changes or ataxia. Denies dizziness, tingling, tremors, sensory change, speech change, focal weakness or headaches.     MSK Generalized pain back, shoulders, legs     Psychiatric/Behavioral The patient is not nervous/anxious.         Allergies   Allergen Reactions   ??? Demerol [Meperidine] Seizures   ??? Fentanyl Other (comments)     "my doctor said I had a stroke from it"   ??? Morphine Itching   ??? Oxycontin [Oxycodone] Vertigo     History reviewed. No pertinent past medical history.  History reviewed. No pertinent surgical history.  History reviewed. No pertinent family history.  Social History     Social History   ??? Marital status: SINGLE     Spouse name: N/A   ??? Number of children: N/A   ??? Years of education: N/A     Occupational History   ??? Not on file.     Social History Main Topics   ??? Smoking status: Not on file   ???  Smokeless tobacco: Not on file   ??? Alcohol use Not on file   ??? Drug use: Not on file   ??? Sexual activity: Not on file     Other Topics Concern   ??? Not on file     Social History Narrative   ??? No narrative on file     Current Facility-Administered Medications   Medication Dose Route Frequency Provider Last Rate Last Dose   ??? folic acid (FOLVITE) tablet 1 mg  1 mg Oral DAILY Jose R Calton Golds, MD   1 mg at 08/16/17 0940   ??? hydroxyurea (HYDREA) chemo cap 1,000 mg  1,000 mg Oral DAILY Dewayne Hatch, MD   1,000 mg at 08/16/17 0940   ??? lacosamide (VIMPAT) tablet 100 mg  100 mg Oral BID Dewayne Hatch, MD   100 mg at 08/16/17 0940   ??? levETIRAcetam (KEPPRA) tablet 1,500 mg  1,500 mg Oral BID Dewayne Hatch, MD   1,500 mg at 08/16/17 0940   ??? rivaroxaban (XARELTO) tablet 20 mg  20 mg Oral DAILY WITH BREAKFAST Dewayne Hatch, MD   20 mg at 08/16/17 0940   ??? famotidine (PF) (PEPCID) 20 mg in sodium chloride 0.9% 10 mL injection  20 mg IntraVENous Q12H Dewayne Hatch, MD   20 mg at 08/16/17 0944   ??? 0.45% sodium chloride infusion  125 mL/hr IntraVENous CONTINUOUS Dewayne Hatch, MD 125 mL/hr at 08/16/17 0938 125 mL/hr at 08/16/17 0938   ??? HYDROmorphone (PF) (DILAUDID) injection 1 mg  1 mg IntraVENous Q3H PRN Dewayne Hatch, MD   1 mg at 08/16/17 0940   ??? diphenhydrAMINE (BENADRYL) capsule 25 mg  25 mg Oral Q6H PRN Dewayne Hatch, MD   25 mg at 08/16/17 0257   ??? oxyCODONE IR (ROXICODONE) tablet 5 mg  5 mg Oral Q4H PRN Dewayne Hatch, MD       ??? acetaminophen (TYLENOL) tablet 650 mg  650 mg Oral Q6H PRN Dewayne Hatch, MD       ??? ondansetron [facility]) injection 4 mg  4 mg IntraVENous Q6H PRN Dewayne Hatch, MD   4 mg at 08/16/17 0257       OBJECTIVE:  Patient Vitals for the past 8 hrs:   BP Temp Pulse Resp SpO2 Weight   08/16/17 0807 97/65 97.8 ??F (36.6 ??C) 78 20 93 % -   08/16/17 0451 101/59 97.6 ??F (36.4 ??C) 81 20 90 % 179 lb 4.8 oz (81.3 kg)     Temp (24hrs), Avg:97.9 ??F (36.6 ??C), Min:97.6 ??F (36.4 ??C), Max:98.3 ??F (36.8 ??C)         Physical Exam:  Constitutional: Well developed, well nourished female in no acute distress, sitting comfortably in the hospital bed.    HEENT: Normocephalic and atraumatic. Oropharynx is clear, mucous membranes are moist.  Pupils are equal, round, and reactive to light. Extraocular muscles are intact.  Sclerae anicteric. Neck supple without JVD. No thyromegaly present.    Lymph node   No palpable submandibular, cervical, supraclavicular, axillary or inguinal lymph nodes.    Skin Warm and dry.  No bruising and no rash noted.  No erythema.  No pallor.    Respiratory Lungs are clear to auscultation bilaterally without wheezes, rales or rhonchi, normal air exchange without accessory muscle use.    CVS Normal rate, regular rhythm and normal S1 and S2.  No murmurs, gallops, or rubs.   Abdomen Soft, nontender and nondistended, normoactive bowel sounds.  No palpable mass.  No hepatosplenomegaly.   Neuro Grossly nonfocal with no obvious sensory or motor deficits.   MSK Normal range of motion in general.  No edema and no tenderness.   Psych Appropriate mood and affect.        Labs:    Recent Results (from the past 24 hour(s))   EKG, 12 LEAD, INITIAL    Collection Time: 08/15/17  6:55 PM   Result Value Ref Range    Ventricular Rate 89 BPM    Atrial Rate 89 BPM    P-R Interval 158 ms    QRS Duration 76 ms    Q-T Interval 346 ms    QTC Calculation (Bezet) 420 ms    Calculated P Axis 65 degrees    Calculated R Axis 40 degrees    Calculated T Axis 55 degrees    Diagnosis       Normal sinus rhythm  Normal ECG  No previous ECGs available  Confirmed by [facility]  MD (UC), MATTHEW G ([id number]) on 08/16/2017 6:58:25 AM     CBC WITH AUTOMATED DIFF    Collection Time: 08/15/17  6:58 PM   Result Value Ref Range    WBC 8.1 4.3 - 11.1 K/uL    RBC 2.50 (L) 4.05 - 5.2 M/uL    HGB 10.4 (L) 11.7 - 15.4 g/dL    HCT 29.6 (L) 35.8 - 46.3 %    MCV 118.4 (H) 79.6 - 97.8 FL    MCH 41.6 (H) 26.1 - 32.9 PG    MCHC 35.1 (H) 31.4 - 35.0 g/dL    RDW 15.9 %    PLATELET 567 (H) 150 - 450 K/uL    MPV 9.2 (L) 9.4 - 12.3 FL    ABSOLUTE NRBC 0.12 0.0 - 0.2 K/uL    DF AUTOMATED      NEUTROPHILS 50 43 - 78 %    LYMPHOCYTES 36 13 - 44 %    MONOCYTES 13 (H) 4.0 - 12.0 %    EOSINOPHILS 0 (L) 0.5 - 7.8 %    BASOPHILS 0 0.0 - 2.0 %    IMMATURE GRANULOCYTES 0 0.0 - 5.0 %    ABS. NEUTROPHILS 4.1 1.7 - 8.2 K/UL    ABS. LYMPHOCYTES 2.9 0.5 - 4.6 K/UL    ABS. MONOCYTES 1.0 0.1 - 1.3 K/UL    ABS. EOSINOPHILS 0.0 0.0 - 0.8 K/UL     ABS. BASOPHILS 0.0 0.0 - 0.2 K/UL    ABS. IMM. GRANS. 0.0 0.0 - 0.5 K/UL   METABOLIC PANEL, COMPREHENSIVE    Collection Time: 08/15/17  6:58 PM   Result Value Ref Range    Sodium 140 136 - 145 mmol/L    Potassium 3.5 3.5 - 5.1 mmol/L    Chloride 106 98 - 107 mmol/L    CO2 25 21 - 32 mmol/L    Anion gap 9 7 - 16 mmol/L    Glucose 83 65 - 100 mg/dL    BUN 5 (L) 6 - 23 MG/DL    Creatinine 0.88 0.6 - 1.0 MG/DL    GFR est AA >60 >60 ml/min/1.31m    GFR est non-AA >60 >60 ml/min/1.722m   Calcium 9.3 8.3 - 10.4 MG/DL    Bilirubin, total 1.1 0.2 - 1.1 MG/DL    ALT (SGPT) 39 12 - 65 U/L    AST (SGOT) 43 (H) 15 -  37 U/L    Alk. phosphatase 134 50 - 136 U/L    Protein, total 8.8 (H) 6.3 - 8.2 g/dL    Albumin 4.0 3.5 - 5.0 g/dL    Globulin 4.8 (H) 2.3 - 3.5 g/dL    A-G Ratio 0.8 (L) 1.2 - 3.5     TROPONIN I    Collection Time: 08/15/17  6:58 PM   Result Value Ref Range    Troponin-I, Qt. <0.02 (L) 0.02 - 0.05 NG/ML   RETICULOCYTE COUNT    Collection Time: 08/15/17  6:58 PM   Result Value Ref Range    Reticulocyte count 6.9 (H) 0.3 - 2.0 %    Absolute Retic Cnt. 0.1715 (H) 0.026 - 0.095 M/ul    Immature Retic Fraction 27.5 (H) 3.0 - 15.9 %    Retic Hgb Conc. 35 29 - 35 pg   CBC W/O DIFF    Collection Time: 08/16/17  6:03 AM   Result Value Ref Range    WBC 8.1 4.3 - 11.1 K/uL    RBC 2.08 (L) 4.05 - 5.2 M/uL    HGB 8.4 (L) 11.7 - 15.4 g/dL    HCT 24.4 (L) 35.8 - 46.3 %    MCV 117.3 (H) 79.6 - 97.8 FL    MCH 40.4 (H) 26.1 - 32.9 PG    MCHC 34.4 31.4 - 35.0 g/dL    RDW 16.9 %    PLATELET 448 150 - 450 K/uL    MPV 9.4 9.4 - 12.3 FL    ABSOLUTE NRBC 0.09 0.0 - 0.2 K/uL   METABOLIC PANEL, COMPREHENSIVE    Collection Time: 08/16/17  6:03 AM   Result Value Ref Range    Sodium 139 136 - 145 mmol/L    Potassium 4.0 3.5 - 5.1 mmol/L    Chloride 107 98 - 107 mmol/L    CO2 23 21 - 32 mmol/L    Anion gap 9 7 - 16 mmol/L    Glucose 124 (H) 65 - 100 mg/dL    BUN 4 (L) 6 - 23 MG/DL    Creatinine 0.76 0.6 - 1.0 MG/DL     GFR est AA >60 >60 ml/min/1.73m    GFR est non-AA >60 >60 ml/min/1.754m   Calcium 8.7 8.3 - 10.4 MG/DL    Bilirubin, total 1.2 (H) 0.2 - 1.1 MG/DL    ALT (SGPT) 34 12 - 65 U/L    AST (SGOT) 41 (H) 15 - 37 U/L    Alk. phosphatase 113 50 - 136 U/L    Protein, total 7.5 6.3 - 8.2 g/dL    Albumin 3.5 3.5 - 5.0 g/dL    Globulin 4.0 (H) 2.3 - 3.5 g/dL    A-G Ratio 0.9 (L) 1.2 - 3.5     LD    Collection Time: 08/16/17  6:03 AM   Result Value Ref Range    LD 362 (H) 100 - 190 U/L   MAGNESIUM    Collection Time: 08/16/17  6:03 AM   Result Value Ref Range    Magnesium 2.1 1.8 - 2.4 mg/dL   RETICULOCYTE COUNT    Collection Time: 08/16/17  6:03 AM   Result Value Ref Range    Reticulocyte count 6.8 (H) 0.3 - 2.0 %    Absolute Retic Cnt. 0.1421 (H) 0.026 - 0.095 M/ul    Immature Retic Fraction 29.4 (H) 3.0 - 15.9 %    Retic Hgb Conc. 33 29 - 35 pg   TROPONIN  I    Collection Time: 08/16/17  6:03 AM   Result Value Ref Range    Troponin-I, Qt. <0.02 (L) 0.02 - 0.05 NG/ML   DRUG SCREEN, URINE    Collection Time: 08/16/17  7:15 AM   Result Value Ref Range    PCP(PHENCYCLIDINE) NEGATIVE       BENZODIAZEPINES NEGATIVE       COCAINE NEGATIVE       AMPHETAMINES NEGATIVE       METHADONE NEGATIVE       THC (TH-CANNABINOL) NEGATIVE       OPIATES POSITIVE      BARBITURATES NEGATIVE      URINALYSIS W/ RFLX MICROSCOPIC    Collection Time: 08/16/17  7:15 AM   Result Value Ref Range    Color YELLOW      Appearance CLEAR      Specific gravity 1.012 1.001 - 1.023      pH (UA) 6.5 5.0 - 9.0      Protein NEGATIVE  NEG mg/dL    Glucose NEGATIVE  mg/dL    Ketone NEGATIVE  NEG mg/dL    Bilirubin NEGATIVE  NEG      Blood LARGE (A) NEG      Urobilinogen 0.2 0.2 - 1.0 EU/dL    Nitrites NEGATIVE  NEG      Leukocyte Esterase NEGATIVE  NEG      WBC 5-10 0 /hpf    RBC 3-5 0 /hpf    Epithelial cells 0-3 0 /hpf    Bacteria TRACE 0 /hpf    Casts 0-3 0 /lpf   EKG, 12 LEAD, SUBSEQUENT    Collection Time: 08/16/17  9:09 AM   Result Value Ref Range     Ventricular Rate 61 BPM    Atrial Rate 61 BPM    P-R Interval 180 ms    QRS Duration 84 ms    Q-T Interval 400 ms    QTC Calculation (Bezet) 402 ms    Calculated P Axis 24 degrees    Calculated R Axis 29 degrees    Calculated T Axis 22 degrees    Diagnosis       Normal sinus rhythm  Normal ECG  When compared with ECG of 15-Aug-2017 18:55,  Nonspecific T wave abnormality now evident in Inferior leads         Imaging:  @[email or web address] @    ASSESSMENT:  Problem List  Never Reviewed          Codes Class Noted    Sickle cell disease ([place]) ICD-10-CM: D57.1  ICD-9-CM: 282.60  08/15/2017                ??  RECOMMENDATIONS:    Sickle cell disease with acute pain crisis  9/13 change to D51/2 NS at 150/hr. Oxygen ordered at 2L. Continue hydrea and folic acid. On dilaudid IV 1 mg q 3 hours and Roxie 5 mg q 4. Hgb 8.4, Retic 6.8.     History of DVT/superior vena cava syndrome  9/13 already on Xarelto-continue    History of seizure disorder  9/13 Continue Keppra and Vimpat      Lab studies and imaging studies were personally reviewed.  Pertinent old records were reviewed.    Thank you for allowing [place] to participate in the care of [name] Bright.  Agree with current plan. Her current living situation makes it difficult to arrange for follow up appointment. If she ever decides to live locally she can follow up with Dr.  Walls at [facility] where she has established relationship or she could be referred to our services. We will sign off. Please call with any questions.          Lannie Fields, NP   [facility]  [address] [place] [address]  Office : [phone]  Fax : [phone]

## 2017-08-16 NOTE — H&P (Signed)
[place] [facility]  HISTORY AND PHYSICAL      Name:Bright, [name]  MR#: [id number]  DOB: 1976/08/29  ACCOUNT #: [id number]   ADMIT DATE: 08/15/2017    TIME OF ADMISSION:  11:30 p.m.    CHIEF COMPLAINT:  Severe generalized pain.    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    The patient [facility]  Apparently, she does not have a home and she travels frequently, which makes her compliance with medical treatment and appointments very poor.  Today, she came into the emergency room saying that about one month ago she had an episode of cough with green sputum production.  Since then, she has been having on and off episodes of severe joint pain which has been intensified in the last week.  According to her now she has 10/10 severe sharp pain in her elbows, knees, lower extremities, back and chest.  She denies fever, chills, cough, abdominal pain or diarrhea at the present time.  When she arrived into the emergency room today her vital signs were blood pressure of 127/86, heart rate 95, respiratory rate of 18, O2 saturation of 95%.  She was awake and alert and complaining of severe pain.  Her lungs were clear to auscultation.  Her abdomen was soft and nontender.  She had distended veins in her neck and chest, which according to her is chronic and due to her superior vena cava syndrome.  Her right lower extremity is also larger than the left one, which according to her is also a chronic condition.  Her initial blood workup reported a normal white blood cell count, stable hemoglobin level,  reticulocyte count of 6.9, normal electrolytes, normal kidney function, normal troponins, normal liver enzymes.  A chest x-ray reported a minor left mid lung zone scaring and blunting of the left costophrenic angle which is a chronic finding.  Her EKG was seen and reported normal sinus rhythm with no evidence of acute ischemia.  The patient received 1 liter of normal saline in the emergency room, she received IV Dilaudid and she was presented to the hospitalist team to be admitted.    REVIEW OF SYSTEMS:  A review of  14 systems was performed and it was negative except for the findings that are reported in the HPI.    PAST MEDICAL HISTORY:  1.  Sickle cell disease.  2.  Avascular necrosis of the left femur.  3.  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  4.  Deep venous thrombosis of the right lower extremity.  5.  Migraines.  6.  Seizure disorder.  7.  History of cerebrovascular accident.  8.  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    PAST SURGICAL HISTORY:  1.  C-section.  2.  Cholecystectomy.  3.  Portal vein catheterization.  4.  Tubal ligation.    SOCIAL HISTORY:  Denies smoking, alcohol use or illicit drug use.    FAMILY HISTORY:  Positive for sickle cell disorder.    PHYSICAL EXAMINATION:  VITAL SIGNS:  Blood pressure 127/86, heart rate 84, respiratory rate  of 18, O2 saturation of 95%.  EYES:  PERRLA.  EARS, NOSE,  MOUTH AND THROAT:  There is no evidence of pharyngeal erythema, edema or purulent exudate.  RESPIRATORY:  Clear breath sounds bilaterally.  CARDIOVASCULAR:  Regular rate and rhythm with no murmurs.  GASTROINTESTINAL:  Abdomen is soft and nontender with positive bowel sounds.  GENITOURINARY:  Unremarkable.  MUSCULOSKELETAL:  The patient has a larger diameter of the right lower extremity due to a previous episode of DVT.  This is a chronic finding.  SKIN:  The patient has engorged veins in the neck and anterior part of her  chest, which is a chronic finding secondary to her superior vena cava syndrome.  NEUROLOGIC:  Patient is alert and oriented with no evidence of focal weakness.  PSYCHIATRIC:  Normal mood.  HEMATOLOGIC, LYMPHATIC, IMMUNOLOGIC:  No evidence of active bleeding.  No abnormal lymphadenopathies.    LABORATORY AND IMAGING DATA:  White blood cell count 8.1, hemoglobin 10.4, platelet count 567.  Sodium 140, potassium 3.5, anion gap 9, BUN 5, creatinine 0.88, calcium 9.3, total bilirubin 1.1, ALT 39, AST 43.  Troponin less than 0.02.  Chest x-ray, seen and analyzed by me, left mid lung scarring.  No evidence of acute lung infiltrates.  No pleural effusions, no cardiomegaly.  EKG, seen and analyzed by me, normal sinus rhythm with no evidence of acute ischemia.    ASSESSMENT:  This is a 41 year old female patient with an extensive past medical history including sickle cell disease and several episodes of deep venous thrombosis.  She is at high risk of further complications.  She is going to be admitted to the hospital and her length of stay is calculated to be more than 2 midnights.    PLAN:  1.  Sickle cell disease with acute pain crisis.  The patient will receive IV fluids and IV Dilaudid p.r.n. for pain control.  She will also receive oral medication for pain control.  She is already using hydroxyurea 1000 mg per day and I will continue using this medication.  Hematology has been consulted and they will see her in the morning.  2.  History of seizure disorder.  I will continue using her regular medications, which are Vimpat and Keppra.  3.  History of extensive deep venous thromboses.  The patient has had deep venous thromboses  of the left upper extremity, right subclavian vein and superior vena cava.  I will continue using her regular dose of Xarelto.  4.  Deep venous thrombosis prophylaxis, already on Xarelto.      Rushie Goltz, MD       JRG/LN  D: 08/16/2017 00:07     T: 08/16/2017 00:48  JOB #: [id number]

## 2017-08-16 NOTE — Progress Notes (Signed)
Pt given Zofran for complaints if nausea and dilaudid for pain rated at a 9/10. Will continue to monitor.

## 2017-08-16 NOTE — Progress Notes (Signed)
20g started using us guidance

## 2017-08-16 NOTE — Progress Notes (Signed)
CM met with pt at bedside.  Father also present.  Pt lives with father.  Ramp at entry.  DME in the home includes a cane.  No home oxygen.  Pt reports being independent and still drives.  PCP and insurance verified.  Pt sees Dr. Mindi Junker at [facility].  Pt plans to DC home.  No needs voiced.    Care Management Interventions  PCP Verified by CM: Yes  Transition of Care Consult (CM Consult): Discharge Planning  Current Support Network: Relative's Home  Confirm Follow Up Transport: Family  Plan discussed with Pt/Family/Caregiver: Yes  Freedom of Choice Offered: Yes  Discharge Location  Discharge Placement: Home

## 2017-08-16 NOTE — Progress Notes (Signed)
Progress Note    Patient: [name] Bright MRN: [id number]  SSN: [id number]    Date of Birth: [birth year]  Age: 41 y.o.  Sex: female      Admit Date: 08/15/2017    LOS: 1 day     Subjective:   F/U Sickle cell disease with acute pain crisis    Receiving fluids and pain management.   Patient stating feeling better. States was compliant with home medications but does not go to regular follow ups.   No chest pain or SOB.     Current Facility-Administered Medications   Medication Dose Route Frequency   ??? folic acid (FOLVITE) tablet 1 mg  1 mg Oral DAILY   ??? hydroxyurea (HYDREA) chemo cap 1,000 mg  1,000 mg Oral DAILY   ??? lacosamide (VIMPAT) tablet 100 mg  100 mg Oral BID   ??? levETIRAcetam (KEPPRA) tablet 1,500 mg  1,500 mg Oral BID   ??? rivaroxaban (XARELTO) tablet 20 mg  20 mg Oral DAILY WITH BREAKFAST   ??? famotidine (PF) (PEPCID) 20 mg in sodium chloride 0.9% 10 mL injection  20 mg IntraVENous Q12H   ??? HYDROmorphone (PF) (DILAUDID) injection 1 mg  1 mg IntraVENous Q3H PRN   ??? diphenhydrAMINE (BENADRYL) capsule 25 mg  25 mg Oral Q6H PRN   ??? oxyCODONE IR (ROXICODONE) tablet 5 mg  5 mg Oral Q4H PRN   ??? acetaminophen (TYLENOL) tablet 650 mg  650 mg Oral Q6H PRN   ??? ondansetron (ZOFRAN) injection 4 mg  4 mg IntraVENous Q6H PRN   ??? dextrose 5 % - 0.45% NaCl infusion  150 mL/hr IntraVENous CONTINUOUS       Objective:     Vitals:    08/16/17 0451 08/16/17 0807 08/16/17 1247 08/16/17 1429   BP: 101/59 97/65 91/56  101/62   Pulse: 81 78 67 84   Resp: 20 20 20 20    Temp: 97.6 ??F (36.4 ??C) 97.8 ??F (36.6 ??C) 97.5 ??F (36.4 ??C) 98.1 ??F (36.7 ??C)   SpO2: 90% 93% 90% 90%   Weight: 81.3 kg (179 lb 4.8 oz)      Height:             Intake and Output:  Current Shift: 09/13 0701 - 09/13 1900  In: 240 [P.O.:240]  Out: -   Last three shifts:      Physical Exam:   General:  Alert, cooperative,   Eyes:  Conjunctivae/corneas clear. PERRL, EOMs intact. Fundi benign   Ears:  Normal TMs and external ear canals both ears.    Nose: Nares normal. Septum midline. Mucosa normal. No drainage or sinus tenderness.   Mouth/Throat: Lips, mucosa, and tongue normal. .   Neck:  no JVD.   Back:   deferred   Lungs:   Clear to auscultation bilaterally.   Heart:  Regular rate and rhythm, S1, S2 normal, no murmur, click, rub or gallop.   Abdomen:   Soft, non-tender. Bowel sounds normal. No masses,  No organomegaly.   Extremities: Extremities normal, atraumatic, no cyanosis or edema. Good capillary refilled. Limited flexion of left hand but other wise ROM normal   Pulses: 2+ and symmetric all extremities.   Skin: Skin color, texture, turgor normal. No rashes or lesions   Lymph nodes: Cervical, supraclavicular, and axillary nodes normal.   Neurologic: CNII-XII intact.        Lab/Data Review:    Recent Results (from the past 24 hour(s))   EKG, 12 LEAD, INITIAL    Collection  Time: 08/15/17  6:55 PM   Result Value Ref Range    Ventricular Rate 89 BPM    Atrial Rate 89 BPM    P-R Interval 158 ms    QRS Duration 76 ms    Q-T Interval 346 ms    QTC Calculation (Bezet) 420 ms    Calculated P Axis 65 degrees    Calculated R Axis 40 degrees    Calculated T Axis 55 degrees    Diagnosis       Normal sinus rhythm  Normal ECG  No previous ECGs available  Confirmed by [facility]  MD (UC), MATTHEW G ([id number]) on 08/16/2017 6:58:25 AM     CBC WITH AUTOMATED DIFF    Collection Time: 08/15/17  6:58 PM   Result Value Ref Range    WBC 8.1 4.3 - 11.1 K/uL    RBC 2.50 (L) 4.05 - 5.2 M/uL    HGB 10.4 (L) 11.7 - 15.4 g/dL    HCT 29.6 (L) 35.8 - 46.3 %    MCV 118.4 (H) 79.6 - 97.8 FL    MCH 41.6 (H) 26.1 - 32.9 PG    MCHC 35.1 (H) 31.4 - 35.0 g/dL    RDW 15.9 %    PLATELET 567 (H) 150 - 450 K/uL    MPV 9.2 (L) 9.4 - 12.3 FL    ABSOLUTE NRBC 0.12 0.0 - 0.2 K/uL    DF AUTOMATED      NEUTROPHILS 50 43 - 78 %    LYMPHOCYTES 36 13 - 44 %    MONOCYTES 13 (H) 4.0 - 12.0 %    EOSINOPHILS 0 (L) 0.5 - 7.8 %    BASOPHILS 0 0.0 - 2.0 %    IMMATURE GRANULOCYTES 0 0.0 - 5.0 %     ABS. NEUTROPHILS 4.1 1.7 - 8.2 K/UL    ABS. LYMPHOCYTES 2.9 0.5 - 4.6 K/UL    ABS. MONOCYTES 1.0 0.1 - 1.3 K/UL    ABS. EOSINOPHILS 0.0 0.0 - 0.8 K/UL    ABS. BASOPHILS 0.0 0.0 - 0.2 K/UL    ABS. IMM. GRANS. 0.0 0.0 - 0.5 K/UL   METABOLIC PANEL, COMPREHENSIVE    Collection Time: 08/15/17  6:58 PM   Result Value Ref Range    Sodium 140 136 - 145 mmol/L    Potassium 3.5 3.5 - 5.1 mmol/L    Chloride 106 98 - 107 mmol/L    CO2 25 21 - 32 mmol/L    Anion gap 9 7 - 16 mmol/L    Glucose 83 65 - 100 mg/dL    BUN 5 (L) 6 - 23 MG/DL    Creatinine 0.88 0.6 - 1.0 MG/DL    GFR est AA >60 >60 ml/min/1.21m    GFR est non-AA >60 >60 ml/min/1.736m   Calcium 9.3 8.3 - 10.4 MG/DL    Bilirubin, total 1.1 0.2 - 1.1 MG/DL    ALT (SGPT) 39 12 - 65 U/L    AST (SGOT) 43 (H) 15 - 37 U/L    Alk. phosphatase 134 50 - 136 U/L    Protein, total 8.8 (H) 6.3 - 8.2 g/dL    Albumin 4.0 3.5 - 5.0 g/dL    Globulin 4.8 (H) 2.3 - 3.5 g/dL    A-G Ratio 0.8 (L) 1.2 - 3.5     TROPONIN I    Collection Time: 08/15/17  6:58 PM   Result Value Ref Range    Troponin-I, Qt. <0.02 (L) 0.02 - 0.05  NG/ML   RETICULOCYTE COUNT    Collection Time: 08/15/17  6:58 PM   Result Value Ref Range    Reticulocyte count 6.9 (H) 0.3 - 2.0 %    Absolute Retic Cnt. 0.1715 (H) 0.026 - 0.095 M/ul    Immature Retic Fraction 27.5 (H) 3.0 - 15.9 %    Retic Hgb Conc. 35 29 - 35 pg   CBC W/O DIFF    Collection Time: 08/16/17  6:03 AM   Result Value Ref Range    WBC 8.1 4.3 - 11.1 K/uL    RBC 2.08 (L) 4.05 - 5.2 M/uL    HGB 8.4 (L) 11.7 - 15.4 g/dL    HCT 24.4 (L) 35.8 - 46.3 %    MCV 117.3 (H) 79.6 - 97.8 FL    MCH 40.4 (H) 26.1 - 32.9 PG    MCHC 34.4 31.4 - 35.0 g/dL    RDW 16.9 %    PLATELET 448 150 - 450 K/uL    MPV 9.4 9.4 - 12.3 FL    ABSOLUTE NRBC 0.09 0.0 - 0.2 K/uL   METABOLIC PANEL, COMPREHENSIVE    Collection Time: 08/16/17  6:03 AM   Result Value Ref Range    Sodium 139 136 - 145 mmol/L    Potassium 4.0 3.5 - 5.1 mmol/L    Chloride 107 98 - 107 mmol/L     CO2 23 21 - 32 mmol/L    Anion gap 9 7 - 16 mmol/L    Glucose 124 (H) 65 - 100 mg/dL    BUN 4 (L) 6 - 23 MG/DL    Creatinine 0.76 0.6 - 1.0 MG/DL    GFR est AA >60 >60 ml/min/1.2m    GFR est non-AA >60 >60 ml/min/1.726m   Calcium 8.7 8.3 - 10.4 MG/DL    Bilirubin, total 1.2 (H) 0.2 - 1.1 MG/DL    ALT (SGPT) 34 12 - 65 U/L    AST (SGOT) 41 (H) 15 - 37 U/L    Alk. phosphatase 113 50 - 136 U/L    Protein, total 7.5 6.3 - 8.2 g/dL    Albumin 3.5 3.5 - 5.0 g/dL    Globulin 4.0 (H) 2.3 - 3.5 g/dL    A-G Ratio 0.9 (L) 1.2 - 3.5     LD    Collection Time: 08/16/17  6:03 AM   Result Value Ref Range    LD 362 (H) 100 - 190 U/L   MAGNESIUM    Collection Time: 08/16/17  6:03 AM   Result Value Ref Range    Magnesium 2.1 1.8 - 2.4 mg/dL   RETICULOCYTE COUNT    Collection Time: 08/16/17  6:03 AM   Result Value Ref Range    Reticulocyte count 6.8 (H) 0.3 - 2.0 %    Absolute Retic Cnt. 0.1421 (H) 0.026 - 0.095 M/ul    Immature Retic Fraction 29.4 (H) 3.0 - 15.9 %    Retic Hgb Conc. 33 29 - 35 pg   TROPONIN I    Collection Time: 08/16/17  6:03 AM   Result Value Ref Range    Troponin-I, Qt. <0.02 (L) 0.02 - 0.05 NG/ML   DRUG SCREEN, URINE    Collection Time: 08/16/17  7:15 AM   Result Value Ref Range    PCP(PHENCYCLIDINE) NEGATIVE       BENZODIAZEPINES NEGATIVE       COCAINE NEGATIVE       AMPHETAMINES NEGATIVE       METHADONE  NEGATIVE       THC (TH-CANNABINOL) NEGATIVE       OPIATES POSITIVE      BARBITURATES NEGATIVE      URINALYSIS W/ RFLX MICROSCOPIC    Collection Time: 08/16/17  7:15 AM   Result Value Ref Range    Color YELLOW      Appearance CLEAR      Specific gravity 1.012 1.001 - 1.023      pH (UA) 6.5 5.0 - 9.0      Protein NEGATIVE  NEG mg/dL    Glucose NEGATIVE  mg/dL    Ketone NEGATIVE  NEG mg/dL    Bilirubin NEGATIVE  NEG      Blood LARGE (A) NEG      Urobilinogen 0.2 0.2 - 1.0 EU/dL    Nitrites NEGATIVE  NEG      Leukocyte Esterase NEGATIVE  NEG      WBC 5-10 0 /hpf    RBC 3-5 0 /hpf    Epithelial cells 0-3 0 /hpf     Bacteria TRACE 0 /hpf    Casts 0-3 0 /lpf   EKG, 12 LEAD, SUBSEQUENT    Collection Time: 08/16/17  9:09 AM   Result Value Ref Range    Ventricular Rate 61 BPM    Atrial Rate 61 BPM    P-R Interval 180 ms    QRS Duration 84 ms    Q-T Interval 400 ms    QTC Calculation (Bezet) 402 ms    Calculated P Axis 24 degrees    Calculated R Axis 29 degrees    Calculated T Axis 22 degrees    Diagnosis       Normal sinus rhythm  Normal ECG  When compared with ECG of 15-Aug-2017 18:55,  No significant change was found    Confirmed by [facility]  MD ([facility]), BARBARA A ([id number]) on 08/16/2017 10:49:19 AM         Assessment/ Plan:     Principal Problem:    Sickle cell disease ([place]) (08/15/2017)    Active Problems:    Seizure disorder ([place]) (08/16/2017)      Hypercoagulable state ([place]) (08/16/2017)    Agree with admitting treatment. Pain control, fluids, folic acid and hydroxyurea for now. Limpat for seizure d/o    DVT prophylaxis - Xarelto    Signed By: Scherrie Merritts, DO     August 16, 2017

## 2017-08-16 NOTE — Progress Notes (Signed)
Interdisciplinary Rounds completed 08/16/17. Nursing, Case Management, Physician and PT present.    Plan of care reviewed and updated.  r

## 2017-08-17 MED ORDER — SODIUM CHLORIDE 0.9 % IV
INTRAVENOUS | Status: DC
Start: 2017-08-17 — End: 2017-08-20
  Administered 2017-08-17 – 2017-08-19 (×6): via INTRAVENOUS

## 2017-08-17 MED ORDER — PANTOPRAZOLE 40 MG TAB, DELAYED RELEASE
40 mg | Freq: Every day | ORAL | Status: DC
Start: 2017-08-17 — End: 2017-08-20
  Administered 2017-08-17 – 2017-08-20 (×4): via ORAL

## 2017-08-17 MED FILL — HYDROMORPHONE (PF) 1 MG/ML IJ SOLN: 1 mg/mL | INTRAMUSCULAR | Qty: 1

## 2017-08-17 MED FILL — LEVETIRACETAM 500 MG TAB: 500 mg | ORAL | Qty: 3

## 2017-08-17 MED FILL — DIPHENHYDRAMINE 25 MG CAP: 25 mg | ORAL | Qty: 1

## 2017-08-17 MED FILL — PANTOPRAZOLE 40 MG TAB, DELAYED RELEASE: 40 mg | ORAL | Qty: 1

## 2017-08-17 MED FILL — FAMOTIDINE (PF) 20 MG/2 ML IV: 20 mg/2 mL | INTRAVENOUS | Qty: 2

## 2017-08-17 MED FILL — ONDANSETRON (PF) 4 MG/2 ML INJECTION: 4 mg/2 mL | INTRAMUSCULAR | Qty: 2

## 2017-08-17 MED FILL — FOLIC ACID 1 MG TAB: 1 mg | ORAL | Qty: 1

## 2017-08-17 MED FILL — XARELTO 20 MG TABLET: 20 mg | ORAL | Qty: 1

## 2017-08-17 MED FILL — VIMPAT 100 MG TABLET: 100 mg | ORAL | Qty: 1

## 2017-08-17 MED FILL — SODIUM CHLORIDE 0.9 % IV: INTRAVENOUS | Qty: 1000

## 2017-08-17 MED FILL — HYDROXYUREA 500 MG CAPSULE: 500 mg | ORAL | Qty: 2

## 2017-08-17 NOTE — Progress Notes (Signed)
Referral sent to [facility] RN.  Chip and Celeste notified.

## 2017-08-17 NOTE — Progress Notes (Signed)
[facility]

## 2017-08-17 NOTE — Progress Notes (Signed)
Interdisciplinary Rounds completed 08/17/17. Nursing, Case Management, Physician and PT present.    Plan of care reviewed and updated.

## 2017-08-17 NOTE — Progress Notes (Signed)
Referral sent to ambulatory case management.

## 2017-08-17 NOTE — Progress Notes (Addendum)
Progress Note    Patient: [name] Bright MRN: [id number]  SSN: [id number]    Date of Birth: 10-Apr-1976  Age: 41 y.o.  Sex: female      Admit Date: 08/15/2017    LOS: 2 days     Subjective:   F/U Sickle cell disease with acute pain crisis    Receiving fluids and pain management. Receiving dilaudid every 3-4 hours.     Patient stating feeling better. States was compliant with home medications but does not go to regular follow ups.   No chest pain or SOB. Having some indigestion. Moving left hand more     Current Facility-Administered Medications   Medication Dose Route Frequency   ??? folic acid (FOLVITE) tablet 1 mg  1 mg Oral DAILY   ??? hydroxyurea (HYDREA) chemo cap 1,000 mg  1,000 mg Oral DAILY   ??? lacosamide (VIMPAT) tablet 100 mg  100 mg Oral BID   ??? levETIRAcetam (KEPPRA) tablet 1,500 mg  1,500 mg Oral BID   ??? rivaroxaban (XARELTO) tablet 20 mg  20 mg Oral DAILY WITH BREAKFAST   ??? famotidine (PF) (PEPCID) 20 mg in sodium chloride 0.9% 10 mL injection  20 mg IntraVENous Q12H   ??? HYDROmorphone (PF) (DILAUDID) injection 1 mg  1 mg IntraVENous Q3H PRN   ??? diphenhydrAMINE (BENADRYL) capsule 25 mg  25 mg Oral Q6H PRN   ??? oxyCODONE IR (ROXICODONE) tablet 5 mg  5 mg Oral Q4H PRN   ??? acetaminophen (TYLENOL) tablet 650 mg  650 mg Oral Q6H PRN   ??? ondansetron (ZOFRAN) injection 4 mg  4 mg IntraVENous Q6H PRN   ??? dextrose 5 % - 0.45% NaCl infusion  150 mL/hr IntraVENous CONTINUOUS       Objective:     Vitals:    08/17/17 0719 08/17/17 1052 08/17/17 1057 08/17/17 1231   BP: 102/55   110/57   Pulse: 68   79   Resp: 20   20   Temp: 98.1 ??F (36.7 ??C)   98 ??F (36.7 ??C)   SpO2: 97% 99% 93% 94%   Weight:       Height:             Intake and Output:  Current Shift: 09/14 0701 - 09/14 1900  In: 120 [P.O.:120]  Out: 1600 [Urine:1600]  Last three shifts: 09/12 1901 - 09/14 0700  In: 2998 [P.O.:480; I.V.:2518]  Out: 1050 [Urine:1050]    Physical Exam:   General:  Alert, cooperative,    Eyes:  Conjunctivae/corneas clear. PERRL, EOMs intact. Fundi benign   Ears:  Normal TMs and external ear canals both ears.   Nose: Nares normal. Septum midline. Mucosa normal. No drainage or sinus tenderness.   Mouth/Throat: Lips, mucosa, and tongue normal. .   Neck:  no JVD.   Back:   deferred   Lungs:   Clear to auscultation bilaterally.   Heart:  Regular rate and rhythm, S1, S2 normal, no murmur, click, rub or gallop.   Abdomen:   Soft, non-tender. Bowel sounds normal. No masses,  No organomegaly.   Extremities: Extremities normal, atraumatic, no cyanosis or edema. Good capillary refilled. Moving left hand more today    Pulses: 2+ and symmetric all extremities.   Skin: Skin color, texture, turgor normal. No rashes or lesions   Lymph nodes: Cervical, supraclavicular, and axillary nodes normal.   Neurologic: CNII-XII intact.        Lab/Data Review:    No results found for this or  any previous visit (from the past 24 hour(s)).    Assessment/ Plan:     Principal Problem:    Sickle cell disease ([facility]) (08/15/2017)    Active Problems:    Seizure disorder ([facility]) (08/16/2017)      Hypercoagulable state ([facility]) (08/16/2017)    Pain control, fluids, folic acid and hydroxyurea for now. Vimpat and Keppra for seizure d/o  Add PPI. Moving more today. Keep dilaudid every 3 hours as needed for now. Try to titrate pain medications tomorrow.  Change fluids to NS.     DVT prophylaxis - Xarelto    Signed By: Evlyn ClinesKimberly Lasonja Lakins, DO     August 17, 2017

## 2017-08-17 NOTE — Progress Notes (Signed)
[facility] states they cannot accept patient because PCP will not sign HH orders. MD notified.

## 2017-08-17 NOTE — Progress Notes (Signed)
Hourly rounds performed;all pt needs met. PRN pain medication administered x3 for generalized pain and benadryl given 2 x for complaints of itching. Bed in low position and call light/ personal items within reach. Will continue to monitor and give bedside shift report to oncoming day shift nurse.

## 2017-08-17 NOTE — Progress Notes (Signed)
[facility]  Face to Face Encounter    Patient???s Name: [name] Bright    Date of Birth: 19-Aug-1976    Ordering Physician: Dr. Randa Evens    Primary Diagnosis: Sickle cell disease [facility])    Date of Face to Face:   08/17/2017                                  Face to Face Encounter findings are related to primary reason for home care:   yes.     1. I certify that the patient needs intermittent care as follows: skilled nursing care:  skilled observation/assessment, patient education    2. I certify that this patient is homebound, that is: 1) patient requires the use of a walker device, special transportation, or assistance of another to leave the home; or 2) patient's condition makes leaving the home medically contraindicated; and 3) patient has a normal inability to leave the home and leaving the home requires considerable and taxing effort.  Patient may leave the home for infrequent and short duration for medical reasons, and occasional absences for non-medical reasons. Homebound status is due to the following functional limitations: Patient with strength deficits limiting the performance of all ADL's without caregiver assistance or the use of an assistive device.    3. I certify that this patient is under my care and that I, or a nurse practitioner or physician???s assistant, or clinical nurse specialist, or certified nurse midwife, working with me, had a Face-to-Face Encounter that meets the physician Face-to-Face Encounter requirements.  The following are the clinical findings from the Face-to-Face encounter that support the need for skilled services and is a summary of the encounter: see hospital chart      See summary of the patient's illness      Jacinto Halim, LMSW  08/17/2017      THE FOLLOWING TO BE COMPLETED BY THE COMMUNITY PHYSICIAN:    I concur with the findings described above from the F2F encounter that this patient is homebound and in need of a skilled service.     Certifying Physician: _____________________________________      Printed Certifying Physician Name: _____________________________________    Date: _________________

## 2017-08-18 ENCOUNTER — Inpatient Hospital Stay

## 2017-08-18 ENCOUNTER — Inpatient Hospital Stay: Admit: 2017-08-18 | Payer: MEDICARE | Primary: Family Medicine

## 2017-08-18 LAB — METABOLIC PANEL, BASIC
Anion gap: 9 mmol/L (ref 7–16)
BUN: 3 MG/DL — ABNORMAL LOW (ref 6–23)
CO2: 23 mmol/L (ref 21–32)
Calcium: 8.5 MG/DL (ref 8.3–10.4)
Chloride: 112 mmol/L — ABNORMAL HIGH (ref 98–107)
Creatinine: 0.85 MG/DL (ref 0.6–1.0)
GFR est AA: >60 mL/min/{1.73_m2} (ref >60)
GFR est non-AA: >60 mL/min/{1.73_m2} (ref >60)
Glucose: 102 mg/dL — ABNORMAL HIGH (ref 65–100)
Potassium: 3.7 mmol/L (ref 3.5–5.1)
Sodium: 144 mmol/L (ref 136–145)

## 2017-08-18 LAB — HGB & HCT
HCT: 23.3 % — ABNORMAL LOW (ref 35.8–46.3)
HGB: 8.1 g/dL — ABNORMAL LOW (ref 11.7–15.4)

## 2017-08-18 MED ORDER — DOCUSATE SODIUM 100 MG CAP
100 mg | Freq: Every day | ORAL | Status: DC
Start: 2017-08-18 — End: 2017-08-20
  Administered 2017-08-18 – 2017-08-20 (×3): via ORAL

## 2017-08-18 MED ORDER — HYDROMORPHONE (PF) 1 MG/ML IJ SOLN
1 mg/mL | Freq: Four times a day (QID) | INTRAMUSCULAR | Status: DC | PRN
Start: 2017-08-18 — End: 2017-08-19
  Administered 2017-08-18 – 2017-08-19 (×3): via INTRAVENOUS

## 2017-08-18 MED FILL — PANTOPRAZOLE 40 MG TAB, DELAYED RELEASE: 40 mg | ORAL | Qty: 1

## 2017-08-18 MED FILL — VIMPAT 100 MG TABLET: 100 mg | ORAL | Qty: 1

## 2017-08-18 MED FILL — HYDROMORPHONE (PF) 1 MG/ML IJ SOLN: 1 mg/mL | INTRAMUSCULAR | Qty: 1

## 2017-08-18 MED FILL — DOCUSATE SODIUM 100 MG CAP: 100 mg | ORAL | Qty: 1

## 2017-08-18 MED FILL — HYDROXYUREA 500 MG CAPSULE: 500 mg | ORAL | Qty: 2

## 2017-08-18 MED FILL — XARELTO 20 MG TABLET: 20 mg | ORAL | Qty: 1

## 2017-08-18 MED FILL — LEVETIRACETAM 500 MG TAB: 500 mg | ORAL | Qty: 3

## 2017-08-18 MED FILL — FAMOTIDINE (PF) 20 MG/2 ML IV: 20 mg/2 mL | INTRAVENOUS | Qty: 2

## 2017-08-18 MED FILL — ONDANSETRON (PF) 4 MG/2 ML INJECTION: 4 mg/2 mL | INTRAMUSCULAR | Qty: 2

## 2017-08-18 MED FILL — FOLIC ACID 1 MG TAB: 1 mg | ORAL | Qty: 1

## 2017-08-18 MED FILL — OXYCODONE 5 MG TAB: 5 mg | ORAL | Qty: 1

## 2017-08-18 NOTE — Progress Notes (Signed)
VAT called at 0900 this morning and was notified that this Pt needed an IV. VAT was notified again at 0130. IV was not placed until 1647. Pt received oral roxi for pain this morning and then feel asleep. Pt did not call for pain medication until her IV was placed. Pt rested most of the day with her family at bedside. Report given to [place], [place].

## 2017-08-18 NOTE — Progress Notes (Signed)
20ga iv placed in right forearm using ultrasound guidance.

## 2017-08-18 NOTE — Progress Notes (Addendum)
Progress Note    Patient: [name] Bright MRN: [id number]  SSN: [id number]    Date of Birth: 1976-11-15  Age: 41 y.o.  Sex: female      Admit Date: 08/15/2017    LOS: 3 days     Subjective:   F/U Sickle cell disease with acute pain crisis    Receiving fluids and pain management. Receiving Dilaudid every 6 hours.     Patient stating feeling better. States was compliant with home medications but does not go to regular follow ups.   No chest pain or SOB. Less indigestion from yesterday since starting PPI. Having left arm pain with swelling distal to IV site. IV site has some bloody drainage.     Current Facility-Administered Medications   Medication Dose Route Frequency   ??? docusate sodium (COLACE) capsule 100 mg  100 mg Oral DAILY   ??? HYDROmorphone (PF) (DILAUDID) injection 1 mg  1 mg IntraVENous Q6H PRN   ??? pantoprazole (PROTONIX) tablet 40 mg  40 mg Oral DAILY   ??? 0.9% sodium chloride infusion  125 mL/hr IntraVENous CONTINUOUS   ??? folic acid (FOLVITE) tablet 1 mg  1 mg Oral DAILY   ??? hydroxyurea (HYDREA) chemo cap 1,000 mg  1,000 mg Oral DAILY   ??? lacosamide (VIMPAT) tablet 100 mg  100 mg Oral BID   ??? levETIRAcetam (KEPPRA) tablet 1,500 mg  1,500 mg Oral BID   ??? rivaroxaban (XARELTO) tablet 20 mg  20 mg Oral DAILY WITH BREAKFAST   ??? famotidine (PF) (PEPCID) 20 mg in sodium chloride 0.9% 10 mL injection  20 mg IntraVENous Q12H   ??? diphenhydrAMINE (BENADRYL) capsule 25 mg  25 mg Oral Q6H PRN   ??? oxyCODONE IR (ROXICODONE) tablet 5 mg  5 mg Oral Q4H PRN   ??? acetaminophen (TYLENOL) tablet 650 mg  650 mg Oral Q6H PRN   ??? ondansetron (ZOFRAN) injection 4 mg  4 mg IntraVENous Q6H PRN       Objective:     Vitals:    08/17/17 1940 08/17/17 2325 08/18/17 0455 08/18/17 0729   BP: 115/70 111/69 116/72 107/67   Pulse: 86 88 95 84   Resp: Temp: 98.1 ??F (36.7 ??C) 98.2 ??F (36.8 ??C) 98.1 ??F (36.7 ??C) 98.4 ??F (36.9 ??C)   SpO2: 97% 92% 91% 93%   Weight:       Height:             Intake and Output:  Current Shift:     Last three shifts: 09/13 1901 - 09/15 0700  In: 5362 [P.O.:360; I.V.:5002]  Out: 3450 [Urine:3450]    Physical Exam:   General:  Alert, cooperative,   Eyes:  Conjunctivae/corneas clear. PERRL, EOMs intact. Fundi benign   Ears:  Normal TMs and external ear canals both ears.   Nose: Nares normal. Septum midline. Mucosa normal. No drainage or sinus tenderness.   Mouth/Throat: Lips, mucosa, and tongue normal. .   Neck:  no JVD.   Back:   deferred   Lungs:   Clear to auscultation bilaterally.   Heart:  Regular rate and rhythm, S1, S2 normal, no murmur, click, rub or gallop.   Abdomen:   Soft, non-tender. Bowel sounds normal. No masses,  No organomegaly.   Extremities: Extremities normal, atraumatic. Good capillary refilled. Moving left hand more today. Left UE has mild swelling compared to the right with small amounts of bloody discharge around the IV site. Good left brachial pulse.  Slow left radial pulse    Pulses: 2+ and symmetric all extremities.   Skin: Skin color, texture, turgor normal. No rashes or lesions   Lymph nodes: Cervical, supraclavicular, and axillary nodes normal.   Neurologic: CNII-XII intact.        Lab/Data Review:    Recent Results (from the past 24 hour(s))   HGB & HCT    Collection Time: 08/18/17  5:32 AM   Result Value Ref Range    HGB 8.1 (L) 11.7 - 15.4 g/dL    HCT 11.923.3 (L) 14.735.8 - 46.3 %   METABOLIC PANEL, BASIC    Collection Time: 08/18/17  5:32 AM   Result Value Ref Range    Sodium 144 136 - 145 mmol/L    Potassium 3.7 3.5 - 5.1 mmol/L    Chloride 112 (H) 98 - 107 mmol/L    CO2 23 21 - 32 mmol/L    Anion gap 9 7 - 16 mmol/L    Glucose 102 (H) 65 - 100 mg/dL    BUN 3 (L) 6 - 23 MG/DL    Creatinine 8.290.85 0.6 - 1.0 MG/DL    GFR est AA >56>60 >21>60 HY/QMV/7.84O9ml/min/1.73m2    GFR est non-AA >60 >60 ml/min/1.3673m2    Calcium 8.5 8.3 - 10.4 MG/DL       Assessment/ Plan:     Principal Problem:    Sickle cell disease ([facility]) (08/15/2017)    Active Problems:    Seizure disorder ([facility]) (08/16/2017)       Hypercoagulable state ([facility]) (08/16/2017)      Left arm swelling (08/18/2017)    Pain control, fluids, folic acid and hydroxyurea for now. Vimpat and Keppra for seizure d/o. Goal is to take more oral pain medications and only use IV as needed. Will reduce dilaudid to use every 6 hr as needed. For her left UE, remove IV. Will order left UE US. Patient is on Xarelto so chance of DVT not likely.     DVT prophylaxis - Xarelto    Signed By: Evlyn ClinesKimberly Alliya Marcon, DO     August 18, 2017

## 2017-08-18 NOTE — Progress Notes (Addendum)
Hourly rounds done. Pt c/o pain & nausea, medicated per MAR. Denies vomiting.  Measurable output 700 mL, yellow/straw/clear.  All needs met at this time.     0715 Pt c/o pain/swelling at IV site. IV insertion unsuccessful by this RN. Pt stated "They have a hard time sticking me."  Vascular team called, left voicemail for peripheral IV insertion.

## 2017-08-19 MED FILL — VIMPAT 100 MG TABLET: 100 mg | ORAL | Qty: 1

## 2017-08-19 MED FILL — LEVETIRACETAM 500 MG TAB: 500 mg | ORAL | Qty: 3

## 2017-08-19 MED FILL — FAMOTIDINE (PF) 20 MG/2 ML IV: 20 mg/2 mL | INTRAVENOUS | Qty: 2

## 2017-08-19 MED FILL — OXYCODONE 5 MG TAB: 5 mg | ORAL | Qty: 1

## 2017-08-19 MED FILL — ONDANSETRON (PF) 4 MG/2 ML INJECTION: 4 mg/2 mL | INTRAMUSCULAR | Qty: 2

## 2017-08-19 MED FILL — PANTOPRAZOLE 40 MG TAB, DELAYED RELEASE: 40 mg | ORAL | Qty: 1

## 2017-08-19 MED FILL — FOLIC ACID 1 MG TAB: 1 mg | ORAL | Qty: 1

## 2017-08-19 MED FILL — HYDROXYUREA 500 MG CAPSULE: 500 mg | ORAL | Qty: 2

## 2017-08-19 MED FILL — HYDROMORPHONE (PF) 1 MG/ML IJ SOLN: 1 mg/mL | INTRAMUSCULAR | Qty: 1

## 2017-08-19 MED FILL — DOCUSATE SODIUM 100 MG CAP: 100 mg | ORAL | Qty: 1

## 2017-08-19 MED FILL — XARELTO 20 MG TABLET: 20 mg | ORAL | Qty: 1

## 2017-08-19 NOTE — Progress Notes (Signed)
Progress Note    Patient: [name] Bright MRN: [id number]  SSN: [id number]    Date of Birth: 01-Dec-1976  Age: 41 y.o.  Sex: female      Admit Date: 08/15/2017    LOS: 4 days     Subjective:   F/U Sickle cell disease with acute pain crisis    Receiving fluids and pain management. Receiving Dilaudid      Patient stating feeling better. States was compliant with home medications but does not go to regular follow ups.   No chest pain or SOB.    Current Facility-Administered Medications   Medication Dose Route Frequency   ??? docusate sodium (COLACE) capsule 100 mg  100 mg Oral DAILY   ??? pantoprazole (PROTONIX) tablet 40 mg  40 mg Oral DAILY   ??? 0.9% sodium chloride infusion  50 mL/hr IntraVENous CONTINUOUS   ??? folic acid (FOLVITE) tablet 1 mg  1 mg Oral DAILY   ??? hydroxyurea (HYDREA) chemo cap 1,000 mg  1,000 mg Oral DAILY   ??? lacosamide (VIMPAT) tablet 100 mg  100 mg Oral BID   ??? levETIRAcetam (KEPPRA) tablet 1,500 mg  1,500 mg Oral BID   ??? rivaroxaban (XARELTO) tablet 20 mg  20 mg Oral DAILY WITH BREAKFAST   ??? famotidine (PF) (PEPCID) 20 mg in sodium chloride 0.9% 10 mL injection  20 mg IntraVENous Q12H   ??? diphenhydrAMINE (BENADRYL) capsule 25 mg  25 mg Oral Q6H PRN   ??? oxyCODONE IR (ROXICODONE) tablet 5 mg  5 mg Oral Q4H PRN   ??? acetaminophen (TYLENOL) tablet 650 mg  650 mg Oral Q6H PRN   ??? ondansetron (ZOFRAN) injection 4 mg  4 mg IntraVENous Q6H PRN       Objective:     Vitals:    08/18/17 2329 08/19/17 0510 08/19/17 0724 08/19/17 1057   BP: 111/75 102/63 104/66 99/66   Pulse: 89 93 80 97   Resp: 18 18 18 18    Temp: 98.5 ??F (36.9 ??C) 98.5 ??F (36.9 ??C) 98.2 ??F (36.8 ??C) 98.1 ??F (36.7 ??C)   SpO2: 91% 93% 90% 92%   Weight:       Height:             Intake and Output:  Current Shift:    Last three shifts: 09/14 1901 - 09/16 0700  In: 3698 [P.O.:240; I.V.:3458]  Out: 2400 [Urine:2400]    Physical Exam:   General:  Alert, cooperative,   Eyes:  Conjunctivae/corneas clear. PERRL, EOMs intact. Fundi benign    Ears:  Normal TMs and external ear canals both ears.   Nose: Nares normal. Septum midline.    Mouth/Throat: Lips, mucosa, and tongue normal. .   Neck:  no JVD.   Back:   deferred   Lungs:   Clear to auscultation bilaterally.   Heart:  Regular rate and rhythm, S1, S2 normal, no murmur, click, rub or gallop.   Abdomen:   Soft, non-tender. Bowel sounds normal. No masses,  No organomegaly.   Extremities: Extremities normal, atraumatic. Good capillary refilled. Moving left hand more today. Left forearm has ecchymosis from previous IV site.    Pulses: 2+ and symmetric all extremities.   Skin: Skin color, texture, turgor normal. No rashes or lesions   Lymph nodes: Cervical, supraclavicular, and axillary nodes normal.   Neurologic: CNII-XII intact.        Lab/Data Review:    No results found for this or any previous visit (from the past 24 hour(s)).  Assessment/ Plan:     Principal Problem:    Sickle cell disease (HCC) (08/15/2017)    Active Problems:    Seizure disorder (HCC) (08/16/2017)      Hypercoagulable state (HCC) (08/16/2017)      Left arm swelling (08/18/2017)    Pain control, fluids, folic acid and hydroxyurea for now. Vimpat and Keppra for seizure d/o. Stop IV dilaudid for pain. Use oxycodone as needed. Reduce fluids to 11ml/hr. If pain controlled today, will d/c tomorrow. Encouraged good eating and drinking.     DVT prophylaxis - Xarelto    Signed By: Evlyn Clines, DO     August 19, 2017

## 2017-08-19 NOTE — Progress Notes (Signed)
Hourly rounds completed and all needs were met at this time.   Pt c/o pain, dilaudid given once then stopped per MD. Pt received roxi twice this shift and zofran once. Pt denies any diarrhea or vomiting.    Pt has been encouraged to ambulate with assistance.   Report given to [place], [place].

## 2017-08-19 NOTE — Progress Notes (Addendum)
Pt c/o pain in neck, RUE. Slight jugular distention. No swelling/erythema noted. VS stable.  MD Dolores FrameGuerra aware, ordered Dilaudid 1 mg IV once & continue to monitor.

## 2017-08-19 NOTE — Progress Notes (Signed)
Hourly rounds done. Pt c/o pain, medicated per MAR. Denies nausea, vomiting.  Voiding output 1700 mL, yellow/straw/clear.  All needs met at this time.

## 2017-08-20 MED ORDER — DOCUSATE SODIUM 100 MG CAP
100 mg | ORAL_CAPSULE | Freq: Every day | ORAL | 0 refills | Status: AC
Start: 2017-08-20 — End: 2017-11-18

## 2017-08-20 MED ORDER — PANTOPRAZOLE 40 MG TAB, DELAYED RELEASE
40 mg | ORAL_TABLET | Freq: Every day | ORAL | 0 refills | Status: AC
Start: 2017-08-20 — End: ?

## 2017-08-20 MED ORDER — OXYCODONE 5 MG TAB
5 mg | ORAL_TABLET | Freq: Four times a day (QID) | ORAL | 0 refills | Status: DC | PRN
Start: 2017-08-20 — End: 2020-01-29

## 2017-08-20 MED ORDER — HYDROMORPHONE (PF) 1 MG/ML IJ SOLN
1 mg/mL | Freq: Once | INTRAMUSCULAR | Status: AC
Start: 2017-08-20 — End: 2017-08-19
  Administered 2017-08-20: 01:00:00 via INTRAVENOUS

## 2017-08-20 MED FILL — LEVETIRACETAM 500 MG TAB: 500 mg | ORAL | Qty: 3

## 2017-08-20 MED FILL — PANTOPRAZOLE 40 MG TAB, DELAYED RELEASE: 40 mg | ORAL | Qty: 1

## 2017-08-20 MED FILL — XARELTO 20 MG TABLET: 20 mg | ORAL | Qty: 1

## 2017-08-20 MED FILL — FOLIC ACID 1 MG TAB: 1 mg | ORAL | Qty: 1

## 2017-08-20 MED FILL — VIMPAT 100 MG TABLET: 100 mg | ORAL | Qty: 1

## 2017-08-20 MED FILL — HYDROMORPHONE (PF) 1 MG/ML IJ SOLN: 1 mg/mL | INTRAMUSCULAR | Qty: 1

## 2017-08-20 MED FILL — HYDROXYUREA 500 MG CAPSULE: 500 mg | ORAL | Qty: 2

## 2017-08-20 MED FILL — OXYCODONE 5 MG TAB: 5 mg | ORAL | Qty: 1

## 2017-08-20 MED FILL — DOCUSATE SODIUM 100 MG CAP: 100 mg | ORAL | Qty: 1

## 2017-08-20 MED FILL — MAPAP (ACETAMINOPHEN) 325 MG TABLET: 325 mg | ORAL | Qty: 2

## 2017-08-20 NOTE — Discharge Summary (Signed)
Discharge Summary by Evlyn Clines, DO at 08/20/17 1610                Author: Evlyn Clines, DO  Service: Internal Medicine  Author Type: Physician       Filed: 08/20/17 0717  Date of Service: 08/20/17 0708  Status: Signed          Editor: Evlyn Clines, DO (Physician)                                   Hospitalist Discharge Summary        Patient ID:   [name] Bright   [id number]   41 y.o.   [birth year]   Admit date: 08/15/2017  9:02 PM   Discharge date and time: 08/20/2017   Attending: Rushie Goltz, *   PCP:  Sol Blazing, MD   Treatment Team: Attending Provider: Rushie Goltz, MD; Utilization Review: Stan Head, RN; Care Manager: Jacinto Halim, LMSW; Charge Nurse: Georgiann Mohs, RN      Principal Diagnosis Sickle cell disease [facility])    Principal Problem:     Sickle cell disease (HCC) (08/15/2017)      Active Problems:     Seizure disorder (HCC) (08/16/2017)        Hypercoagulable state (HCC) (08/16/2017)        Left arm swelling (08/18/2017)       Hospital Course:41 year old female patient who has a past medical history of sickle cell disease,  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 Pamalee Leyden from [facility].  Apparently, she does not have a home and she travels frequently, which makes her compliance with medical treatment and appointments very poor.        Patient recently had URI and then started to have joint pain throughout. Patient was given IV fluids, IV pain medications, and continued on home medications of hydroxyurea and folic acid. Hematology consulted. No signs of acute chest syndrome. No further  recommendations from hematology while in this hospital stay. Throughout admission, pain has been controlled  with oxycodone without any adverse effects. Vitals stable. Patient did specifically want Dilaudid orally stating her PCP gives her this and she  has been taking regularly. When checking PMP, last script for dilaudid was in March 2017. Patient also via PMP had been on methadone but last script two years ago. Since patient has improved with oxycodone, will discharge with 30 tablets. To see PCP in  close follow up to discuss further management of her chronic pain.       Patient did have infiltrated IV in left forearm. [place] left arm performed that showed no DVT.    Discharging with PPI and colace due to constipation and indigestion. With medication, these issues have resolved.       If worsening pain, SOB, or chest pain, please come back to the ED. Remain complaint with hydroxyurea and other home medications.       Please refer to the admission H&P for details of presentation. In summary, the patient is stable for discharge.       Significant Diagnostic Studies:  Labs:  Results:                  Chemistry  Recent Labs          08/18/17    0532      GLU   102*      NA   144      K   3.7      CL   112*      CO2   23      Bright   3*      CREA   0.85      CA   8.5      AGAP   9              CBC w/Diff  Recent Labs          08/18/17    0532      HGB   8.1*      HCT   23.3*              Cardiac Enzymes  No results for input(s): CPK, CKND1, MYO in the last 72 hours.      No lab exists for component: CKRMB, TROIP     Coagulation  No results for input(s): PTP, INR, APTT in the last 72 hours.      No lab exists for component: INREXT      Lipid Panel  No results found for: CHOL, CHOLPOCT, CHOLX, CHLST, CHOLV, 884269, HDL, LDL, LDLC, DLDLP, 161096, VLDLC, VLDL, TGLX, TRIGL, TRIGP, TGLPOCT, CHHD, CHHDX     BNP  No results for input(s): BNPP in the last 72 hours.     Liver Enzymes  No results for input(s): TP, ALB, TBIL, AP, SGOT, GPT in the last 72 hours.      No lab exists for component: DBIL     Thyroid Studies  No results  found for: T4, T3U, TSH, TSHEXT             Discharge Exam:     Visit Vitals         ?  BP  111/71 (BP 1 Location: Right leg, BP Patient Position: At rest)     ?  Pulse  93     ?  Temp  98.5 ??F (36.9 ??C)     ?  Resp  18     ?  Ht   (1.727 m)     ?  Wt  83.4 kg (183 lb 14.4 oz)     ?  SpO2  90%         ?  BMI  27.96 kg/m2        General appearance: alert, cooperative, no distress, appears stated age   Lungs: clear to auscultation bilaterally   Heart: regular rate and rhythm, S1, S2 normal, no murmur, click, rub or gallop   Abdomen: soft, non-tender. Bowel sounds normal. No masses,  no organomegaly   Extremities: no cyanosis or edema. Moving hands more today. Mild pain at left forearm at site of ecchymosis    Neurologic: Grossly normal      Disposition: home    Discharge Condition: stable   Patient Instructions: as above      Current Discharge Medication List              START taking these medications          Details        docusate sodium (COLACE) 100  mg capsule  Take 1 Cap by mouth daily for 90 days.   Qty: 30 Cap, Refills:  0               pantoprazole (PROTONIX) 40 mg tablet  Take 1 Tab by mouth daily.   Qty: 30 Tab, Refills:  0               oxyCODONE IR (ROXICODONE) 5 mg immediate release tablet  Take 1 Tab by mouth every six (6) hours as needed. Max Daily Amount: 20 mg.   Qty: 30 Tab, Refills:  0          Associated Diagnoses: Pain                     CONTINUE these medications which have NOT CHANGED          Details        levETIRAcetam (KEPPRA) 750 mg tablet  Take 1,500 mg by mouth two (2) times a day.               lacosamide (VIMPAT) 100 mg tab tablet  Take 100 mg by mouth two (2) times a day.               hydroxyurea (HYDREA) 500 mg capsule  Take 500 mg by mouth daily.               rivaroxaban (XARELTO) 20 mg tab tablet  Take 20 mg by mouth daily (with breakfast).               folic acid (FOLVITE) 1 mg tablet  Take 1 mg by mouth daily.                         Activity: up and lib    Diet: Regular     Wound Care: none       Follow-up PCP within the week    ??       Time spent to discharge patient 35 minutes   Signed:   Evlyn ClinesKimberly Vester [place], DO   08/20/2017   7:08 AM

## 2017-08-20 NOTE — Discharge Summary (Signed)
Hospitalist Discharge Summary     Patient ID:  [name] Bright  [id number]  41 y.o.  1976/04/25  Admit date: 08/15/2017  9:02 PM  Discharge date and time: 08/20/2017  Attending: Rushie Goltz, *  PCP:  Sol Blazing, MD  Treatment Team: Attending Provider: Rushie Goltz, MD; Utilization Review: Stan Head, RN; Care Manager: Jacinto Halim, LMSW; Charge Nurse: Georgiann Mohs, RN    Principal Diagnosis Sickle cell disease [facility])   Principal Problem:    Sickle cell disease (HCC) (08/15/2017)    Active Problems:    Seizure disorder (HCC) (08/16/2017)      Hypercoagulable state (HCC) (08/16/2017)      Left arm swelling (08/18/2017)     Hospital Course:41 year old female patient who has a past medical history of sickle cell disease, 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 Pamalee Leyden from [facility].  Apparently, she does not have a home and she travels frequently, which makes her compliance with medical treatment and appointments very poor.      Patient recently had URI and then started to have joint pain throughout. Patient was given IV fluids, IV pain medications, and continued on home medications of hydroxyurea and folic acid. Hematology consulted. No signs of acute chest syndrome. No further recommendations from hematology while in this hospital stay. Throughout admission, pain has been controlled with oxycodone without any adverse effects. Vitals stable. Patient did specifically want Dilaudid orally stating her PCP gives her this and she has been taking regularly. When checking PMP, last script for dilaudid was in March 2017. Patient also via PMP had been on methadone but last script  two years ago. Since patient has improved with oxycodone, will discharge with 30 tablets. To see PCP in close follow up to discuss further management of her chronic pain.     Patient did have infiltrated IV in left forearm. [place] left arm performed that showed no DVT.   Discharging with PPI and colace due to constipation and indigestion. With medication, these issues have resolved.     If worsening pain, SOB, or chest pain, please come back to the ED. Remain complaint with hydroxyurea and other home medications.     Please refer to the admission H&P for details of presentation. In summary, the patient is stable for discharge.     Significant Diagnostic Studies:       Labs: Results:       Chemistry Recent Labs      08/18/17   0532   GLU  102*   NA  144   K  3.7   CL  112*   CO2  23   BUN  3*   CREA  0.85   CA  8.5   AGAP  9      CBC w/Diff Recent Labs      08/18/17   0532   HGB  8.1*   HCT  23.3*      Cardiac Enzymes No results for input(s): CPK, CKND1, MYO in the last 72 hours.    No lab exists for component: CKRMB, TROIP   Coagulation No results for input(s): PTP, INR, APTT in the last 72 hours.    No lab exists for component: INREXT  Lipid Panel No results found for: CHOL, CHOLPOCT, CHOLX, CHLST, CHOLV, 884269, HDL, LDL, LDLC, DLDLP, 804564, VLDLC, VLDL, TGLX, TRIGL, TRIGP, TGLPOCT, CHHD, CHHDX   BNP No results for input(s): BNPP in the last 72 hours.   Liver Enzymes No results for input(s): TP, ALB, TBIL, AP, SGOT, GPT in the last 72 hours.    No lab exists for component: DBIL   Thyroid Studies No results found for: T4, T3U, TSH, TSHEXT         Discharge Exam:  Visit Vitals   ??? BP 111/71 (BP 1 Location: Right leg, BP Patient Position: At rest)   ??? Pulse 93   ??? Temp 98.5 ??F (36.9 ??C)   ??? Resp 18   ??? Ht 5\' 8"  (1.727 m)   ??? Wt 83.4 kg (183 lb 14.4 oz)   ??? SpO2 90%   ??? BMI 27.96 kg/m2     General appearance: alert, cooperative, no distress, appears stated age  Lungs: clear to auscultation bilaterally   Heart: regular rate and rhythm, S1, S2 normal, no murmur, click, rub or gallop  Abdomen: soft, non-tender. Bowel sounds normal. No masses,  no organomegaly  Extremities: no cyanosis or edema. Moving hands more today. Mild pain at left forearm at site of ecchymosis   Neurologic: Grossly normal    Disposition: home   Discharge Condition: stable  Patient Instructions: as above   Current Discharge Medication List      START taking these medications    Details   docusate sodium (COLACE) 100 mg capsule Take 1 Cap by mouth daily for 90 days.  Qty: 30 Cap, Refills: 0      pantoprazole (PROTONIX) 40 mg tablet Take 1 Tab by mouth daily.  Qty: 30 Tab, Refills: 0      oxyCODONE IR (ROXICODONE) 5 mg immediate release tablet Take 1 Tab by mouth every six (6) hours as needed. Max Daily Amount: 20 mg.  Qty: 30 Tab, Refills: 0    Associated Diagnoses: Pain         CONTINUE these medications which have NOT CHANGED    Details   levETIRAcetam (KEPPRA) 750 mg tablet Take 1,500 mg by mouth two (2) times a day.      lacosamide (VIMPAT) 100 mg tab tablet Take 100 mg by mouth two (2) times a day.      hydroxyurea (HYDREA) 500 mg capsule Take 500 mg by mouth daily.      rivaroxaban (XARELTO) 20 mg tab tablet Take 20 mg by mouth daily (with breakfast).      folic acid (FOLVITE) 1 mg tablet Take 1 mg by mouth daily.             Activity: up and lib   Diet: Regular   Wound Care: none     Follow-up PCP within the week   ??     Time spent to discharge patient 35 minutes  Signed:  Evlyn Clines, DO  08/20/2017  7:08 AM

## 2017-08-20 NOTE — Progress Notes (Signed)
Discharge instructions and prescriptions provided and explained to patient, patient voiced understanding.Medication side effect sheet reviewed with pt. No home meds or valuables to return. Opportunity for questions provided. Instructed to call once ready to leave the floor.

## 2017-08-20 NOTE — Progress Notes (Signed)
Hourly rounds done. Pt c/o pain, medicated per [facility] w/ Dilaudid 22m IV once.  Denies nausea, vomiting.  Voiding output 1475 mL, yellow/straw/clear.  All needs met at this time.

## 2017-08-31 DIAGNOSIS — Z1231 Encounter for screening mammogram for malignant neoplasm of breast: Secondary | ICD-10-CM | POA: Diagnosis not present

## 2017-08-31 DIAGNOSIS — M549 Dorsalgia, unspecified: Secondary | ICD-10-CM | POA: Diagnosis not present

## 2017-08-31 DIAGNOSIS — Z0000 Encounter for general adult medical examination without abnormal findings: Secondary | ICD-10-CM | POA: Diagnosis not present

## 2017-08-31 DIAGNOSIS — D571 Sickle-cell disease without crisis: Secondary | ICD-10-CM | POA: Diagnosis not present

## 2017-08-31 DIAGNOSIS — Z760 Encounter for issue of repeat prescription: Secondary | ICD-10-CM | POA: Diagnosis not present

## 2017-09-04 DIAGNOSIS — R42 Dizziness and giddiness: Secondary | ICD-10-CM | POA: Diagnosis not present

## 2017-09-04 DIAGNOSIS — F330 Major depressive disorder, recurrent, mild: Secondary | ICD-10-CM | POA: Diagnosis not present

## 2017-09-04 DIAGNOSIS — Z0000 Encounter for general adult medical examination without abnormal findings: Secondary | ICD-10-CM | POA: Diagnosis not present

## 2017-09-04 DIAGNOSIS — H5712 Ocular pain, left eye: Secondary | ICD-10-CM | POA: Diagnosis not present

## 2017-09-04 IMAGING — CT CT ANGIO CHEST
2 of 7 series · 18 of 36 positions shown · IV contrast (isovue)
Comparison: CT HS 02/02/2016

CLINICAL DATA: Chest pain and dyspnea.  Sickle cell

EXAM:
CT ANGIOGRAPHY CHEST WITH CONTRAST
TECHNIQUE: Multidetector CT imaging of the chest was performed using the
standard protocol during bolus administration of intravenous
contrast. Multiplanar CT image reconstructions and MIPs were
obtained to evaluate the vascular anatomy.
CONTRAST:  80 mL Isovue 370 IV

[Series 11: pe coronal mpr · coronal · 0.52mm/px · 1 of 129 slices shown]
[im 65/129  mediastinal]
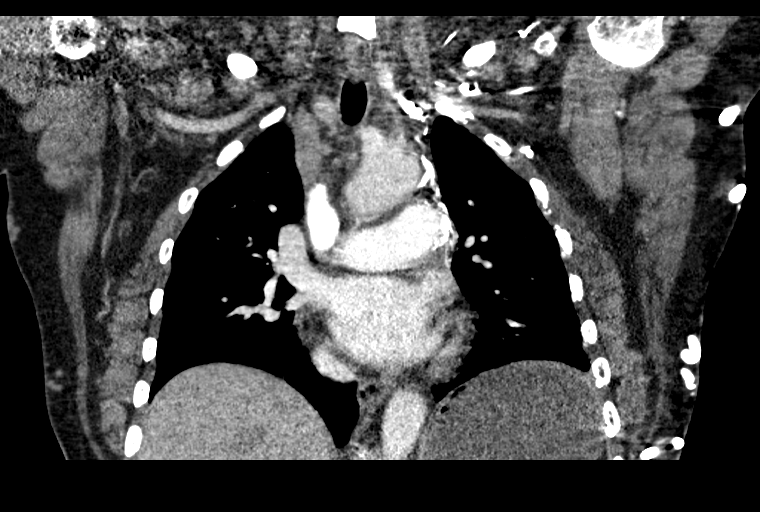

[Series 15: pe thins · axial · 0.75mm/px · z∈[-288,-67]mm · 17 of 248 slices shown]
[im 14/248  lung]
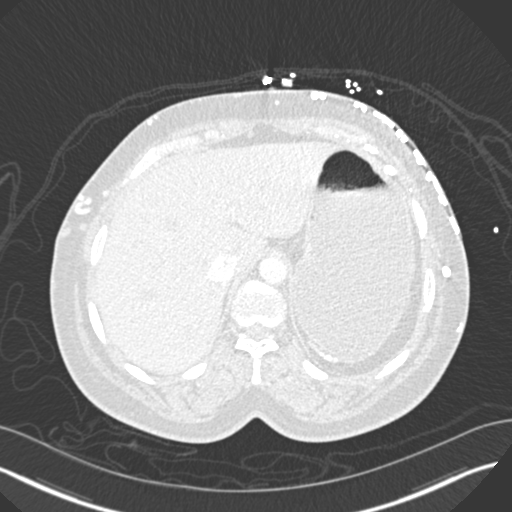
[im 27/248  mediastinal]
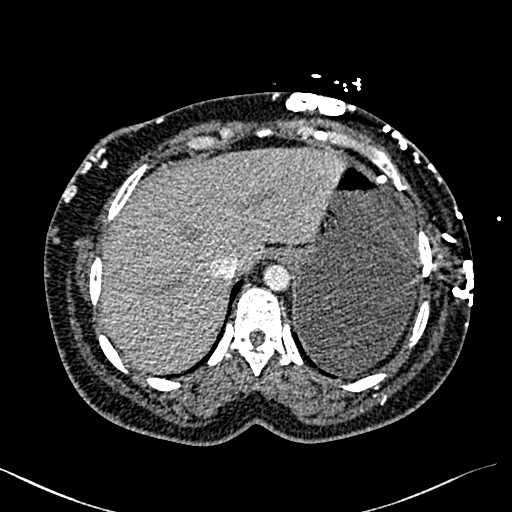
[im 40/248  lung]
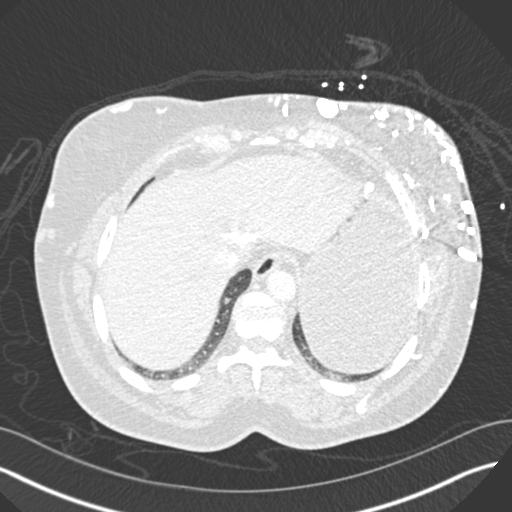
[im 53/248  mediastinal]
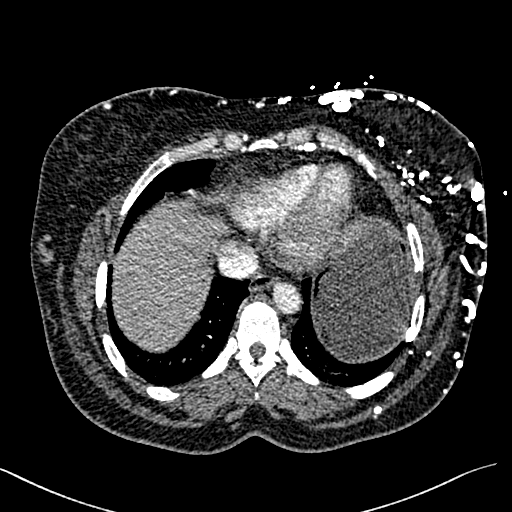
[im 66/248  lung]
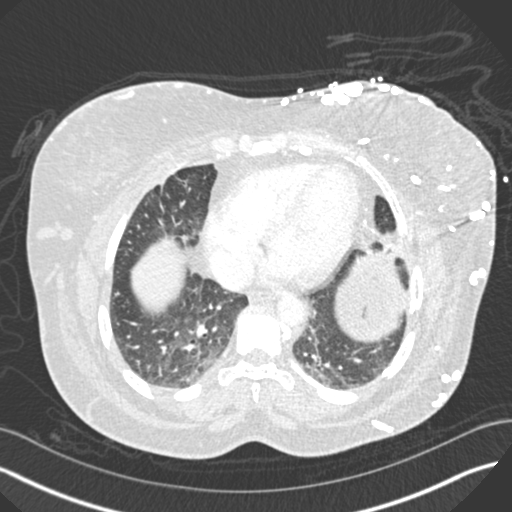
[im 79/248  mediastinal]
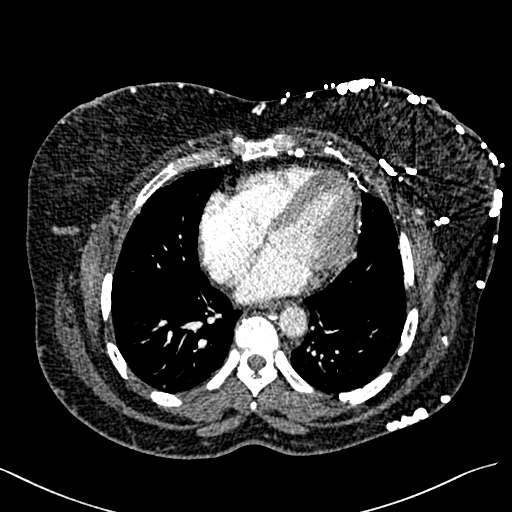
[im 92/248  lung]
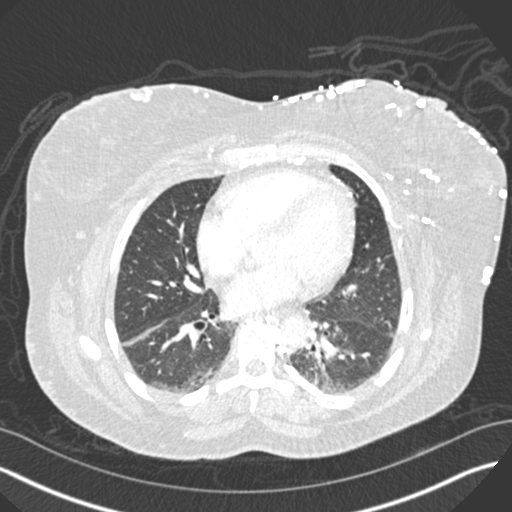
[im 105/248  mediastinal]
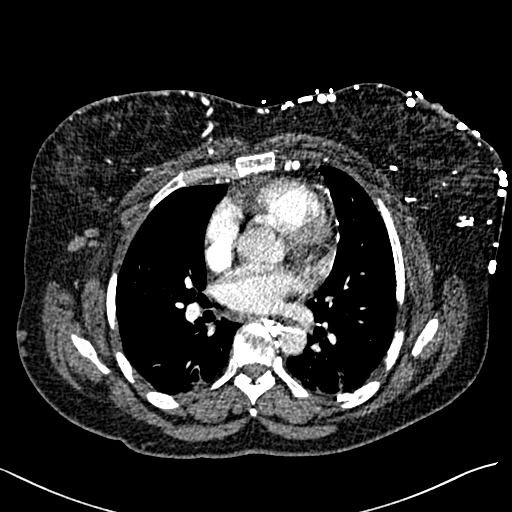
[im 131/248  lung]
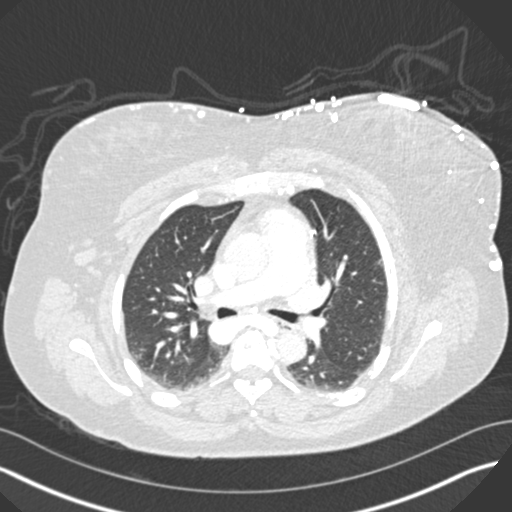
[im 144/248  mediastinal]
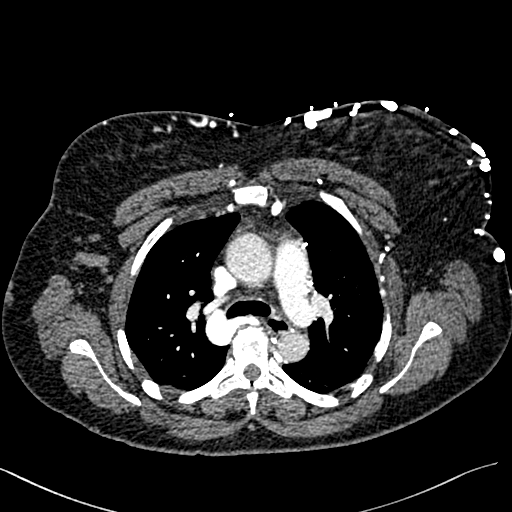
[im 157/248  lung]
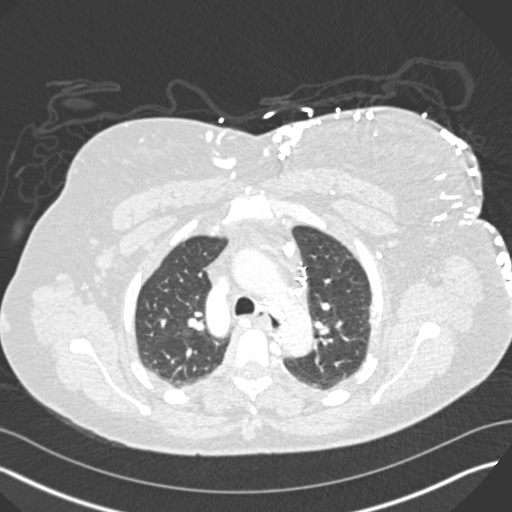
[im 170/248  mediastinal]
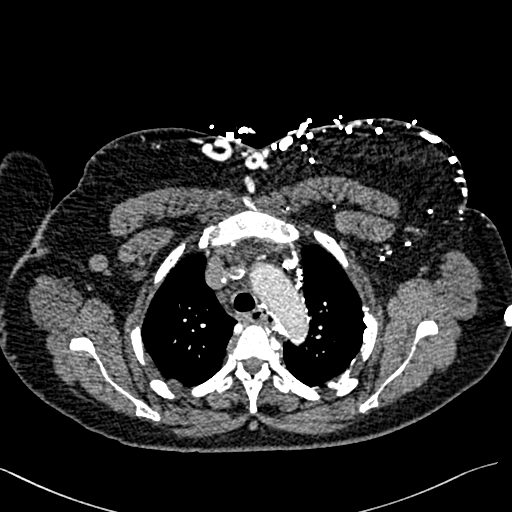
[im 183/248  lung]
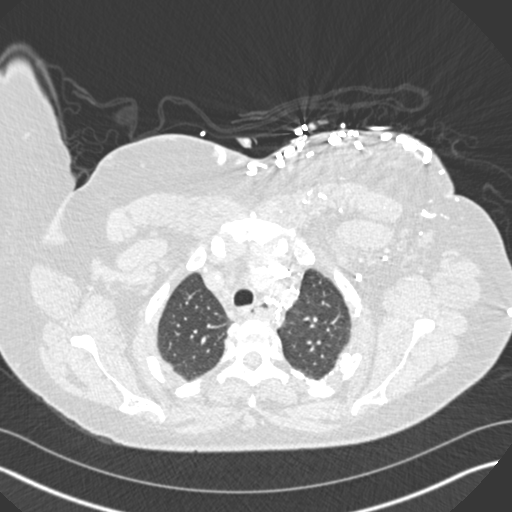
[im 196/248  mediastinal]
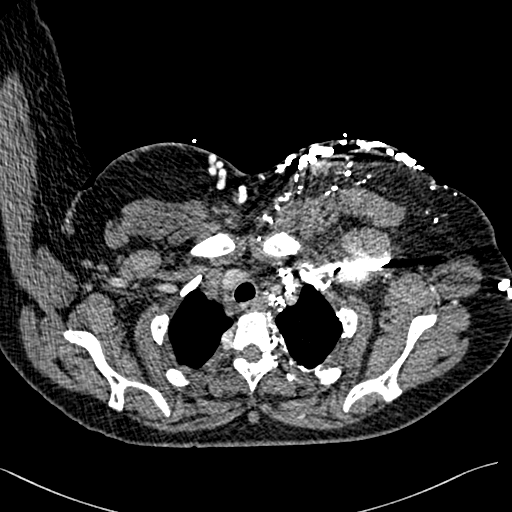
[im 209/248  lung]
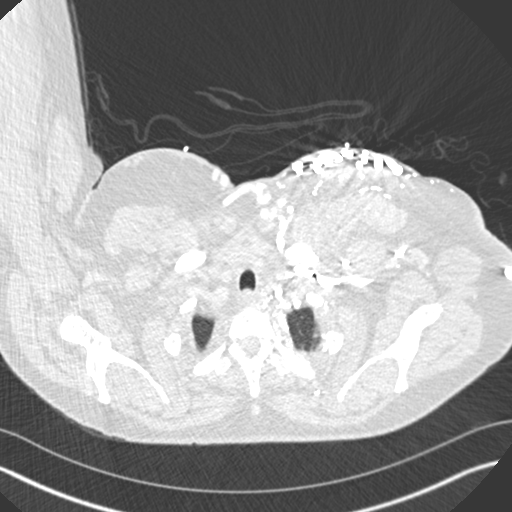
[im 222/248  mediastinal]
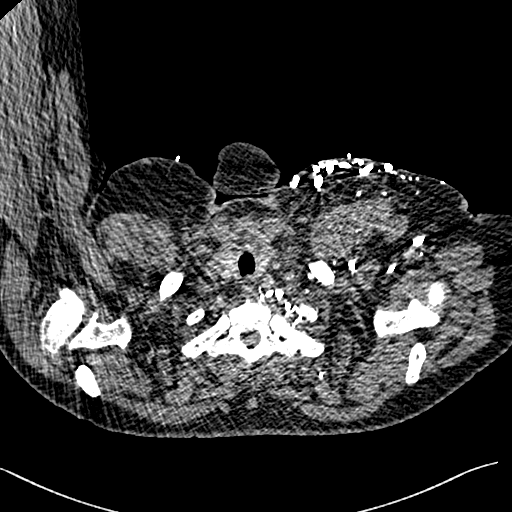
[im 235/248  lung]
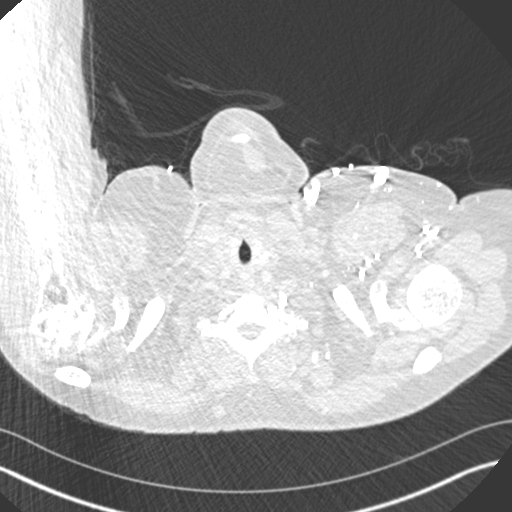

[18 of 36 positions shown; findings below may reference images not displayed]

FINDINGS: Cardiovascular: Left arm injection. Occlusion of the left innominate
vein. Large azygos vein and hemi azygous vein. Numerous large
subcutaneous collateral veins in the chest wall bilaterally left
greater than right similar to the prior study due to SVC occlusion.
Multiple venous collaterals in the superior mediastinum.

Collateral flow causes decreased opacification of the pulmonary
arteries. Allowing for this, no pulmonary emboli identified.
Negative for aortic aneurysm. Heart size within normal limits. No
pericardial effusion

Mediastinum/Nodes: Negative for mass or adenopathy. Numerous
enhancing venous collaterals in the mediastinum due to SVC occlusion

Lungs/Pleura: Mild scarring in the left lower lobe and lingula. Mild
right middle lobe scarring. Negative for infiltrate effusion or
mass.

Upper Abdomen: 2 x 5 mm calcification above the left kidney
unchanged from the prior study. This is not in the adrenal gland but
may be due to prior hematoma.

Musculoskeletal: Sclerotic lesion T3 appears benign and associated
with a venous collateral. Negative for fracture.

Review of the MIP images confirms the above findings.
IMPRESSION: Negative for pulmonary embolism.

Suboptimal study due to SVC chronic occlusion with numerous
collateral veins in the chest wall and mediastinum. This was also
present on the prior CT.

Lungs are clear

## 2017-09-18 DIAGNOSIS — F330 Major depressive disorder, recurrent, mild: Secondary | ICD-10-CM | POA: Diagnosis not present

## 2017-09-18 DIAGNOSIS — Z0000 Encounter for general adult medical examination without abnormal findings: Secondary | ICD-10-CM | POA: Diagnosis not present

## 2017-09-18 DIAGNOSIS — Z1231 Encounter for screening mammogram for malignant neoplasm of breast: Secondary | ICD-10-CM | POA: Diagnosis not present

## 2017-09-18 DIAGNOSIS — H5712 Ocular pain, left eye: Secondary | ICD-10-CM | POA: Diagnosis not present

## 2017-09-18 DIAGNOSIS — R42 Dizziness and giddiness: Secondary | ICD-10-CM | POA: Diagnosis not present

## 2017-09-18 DIAGNOSIS — R040 Epistaxis: Secondary | ICD-10-CM | POA: Diagnosis not present

## 2017-09-19 DIAGNOSIS — F330 Major depressive disorder, recurrent, mild: Secondary | ICD-10-CM | POA: Diagnosis not present

## 2017-09-19 DIAGNOSIS — R42 Dizziness and giddiness: Secondary | ICD-10-CM | POA: Diagnosis not present

## 2017-09-19 DIAGNOSIS — H5712 Ocular pain, left eye: Secondary | ICD-10-CM | POA: Diagnosis not present

## 2017-10-10 DIAGNOSIS — R0781 Pleurodynia: Secondary | ICD-10-CM | POA: Diagnosis not present

## 2017-10-10 DIAGNOSIS — R9431 Abnormal electrocardiogram [ECG] [EKG]: Secondary | ICD-10-CM | POA: Diagnosis not present

## 2017-10-10 DIAGNOSIS — D571 Sickle-cell disease without crisis: Secondary | ICD-10-CM | POA: Diagnosis not present

## 2017-10-10 DIAGNOSIS — K219 Gastro-esophageal reflux disease without esophagitis: Secondary | ICD-10-CM | POA: Diagnosis not present

## 2017-10-10 DIAGNOSIS — R079 Chest pain, unspecified: Secondary | ICD-10-CM | POA: Diagnosis not present

## 2017-11-02 DIAGNOSIS — I679 Cerebrovascular disease, unspecified: Secondary | ICD-10-CM | POA: Diagnosis not present

## 2017-11-02 DIAGNOSIS — R569 Unspecified convulsions: Secondary | ICD-10-CM | POA: Diagnosis not present

## 2017-11-02 DIAGNOSIS — R51 Headache: Secondary | ICD-10-CM | POA: Diagnosis not present

## 2017-11-02 DIAGNOSIS — R9431 Abnormal electrocardiogram [ECG] [EKG]: Secondary | ICD-10-CM | POA: Diagnosis not present

## 2017-11-02 DIAGNOSIS — M542 Cervicalgia: Secondary | ICD-10-CM | POA: Diagnosis not present

## 2017-11-02 DIAGNOSIS — R509 Fever, unspecified: Secondary | ICD-10-CM | POA: Diagnosis not present

## 2017-11-02 DIAGNOSIS — H539 Unspecified visual disturbance: Secondary | ICD-10-CM | POA: Diagnosis not present

## 2017-11-02 DIAGNOSIS — J90 Pleural effusion, not elsewhere classified: Secondary | ICD-10-CM | POA: Diagnosis not present

## 2017-11-02 DIAGNOSIS — G40909 Epilepsy, unspecified, not intractable, without status epilepticus: Secondary | ICD-10-CM | POA: Diagnosis not present

## 2017-11-02 DIAGNOSIS — R4701 Aphasia: Secondary | ICD-10-CM | POA: Diagnosis not present

## 2017-11-02 DIAGNOSIS — K219 Gastro-esophageal reflux disease without esophagitis: Secondary | ICD-10-CM | POA: Diagnosis not present

## 2017-11-02 DIAGNOSIS — F329 Major depressive disorder, single episode, unspecified: Secondary | ICD-10-CM | POA: Diagnosis not present

## 2017-11-02 DIAGNOSIS — R490 Dysphonia: Secondary | ICD-10-CM | POA: Diagnosis not present

## 2017-11-02 DIAGNOSIS — H538 Other visual disturbances: Secondary | ICD-10-CM | POA: Diagnosis not present

## 2017-11-02 DIAGNOSIS — Z7901 Long term (current) use of anticoagulants: Secondary | ICD-10-CM | POA: Diagnosis not present

## 2017-11-02 DIAGNOSIS — G441 Vascular headache, not elsewhere classified: Secondary | ICD-10-CM | POA: Diagnosis not present

## 2017-11-02 DIAGNOSIS — G8929 Other chronic pain: Secondary | ICD-10-CM | POA: Diagnosis not present

## 2017-11-02 DIAGNOSIS — Z86718 Personal history of other venous thrombosis and embolism: Secondary | ICD-10-CM | POA: Diagnosis not present

## 2017-11-02 DIAGNOSIS — I871 Compression of vein: Secondary | ICD-10-CM | POA: Diagnosis not present

## 2017-11-02 DIAGNOSIS — I69353 Hemiplegia and hemiparesis following cerebral infarction affecting right non-dominant side: Secondary | ICD-10-CM | POA: Diagnosis not present

## 2017-11-02 DIAGNOSIS — D539 Nutritional anemia, unspecified: Secondary | ICD-10-CM | POA: Diagnosis not present

## 2017-11-02 DIAGNOSIS — M1990 Unspecified osteoarthritis, unspecified site: Secondary | ICD-10-CM | POA: Diagnosis not present

## 2017-11-02 DIAGNOSIS — Z79899 Other long term (current) drug therapy: Secondary | ICD-10-CM | POA: Diagnosis not present

## 2017-11-02 DIAGNOSIS — G43909 Migraine, unspecified, not intractable, without status migrainosus: Secondary | ICD-10-CM | POA: Diagnosis not present

## 2017-11-02 DIAGNOSIS — D5700 Hb-SS disease with crisis, unspecified: Secondary | ICD-10-CM | POA: Diagnosis not present

## 2017-11-02 DIAGNOSIS — M545 Low back pain: Secondary | ICD-10-CM | POA: Diagnosis not present

## 2017-11-02 DIAGNOSIS — D571 Sickle-cell disease without crisis: Secondary | ICD-10-CM | POA: Diagnosis not present

## 2017-11-03 DIAGNOSIS — Z86718 Personal history of other venous thrombosis and embolism: Secondary | ICD-10-CM | POA: Diagnosis not present

## 2017-11-03 DIAGNOSIS — K219 Gastro-esophageal reflux disease without esophagitis: Secondary | ICD-10-CM | POA: Diagnosis not present

## 2017-11-03 DIAGNOSIS — R51 Headache: Secondary | ICD-10-CM | POA: Diagnosis not present

## 2017-11-03 DIAGNOSIS — R490 Dysphonia: Secondary | ICD-10-CM | POA: Diagnosis not present

## 2017-11-03 DIAGNOSIS — G40909 Epilepsy, unspecified, not intractable, without status epilepticus: Secondary | ICD-10-CM | POA: Diagnosis not present

## 2017-11-03 DIAGNOSIS — D571 Sickle-cell disease without crisis: Secondary | ICD-10-CM | POA: Diagnosis not present

## 2017-11-03 DIAGNOSIS — D539 Nutritional anemia, unspecified: Secondary | ICD-10-CM | POA: Diagnosis not present

## 2017-11-03 DIAGNOSIS — R531 Weakness: Secondary | ICD-10-CM | POA: Diagnosis not present

## 2017-11-07 DIAGNOSIS — F330 Major depressive disorder, recurrent, mild: Secondary | ICD-10-CM | POA: Diagnosis not present

## 2017-11-07 DIAGNOSIS — R51 Headache: Secondary | ICD-10-CM | POA: Diagnosis not present

## 2017-11-12 DIAGNOSIS — G8929 Other chronic pain: Secondary | ICD-10-CM | POA: Diagnosis not present

## 2017-11-12 DIAGNOSIS — Z86718 Personal history of other venous thrombosis and embolism: Secondary | ICD-10-CM | POA: Diagnosis not present

## 2017-11-12 DIAGNOSIS — I871 Compression of vein: Secondary | ICD-10-CM | POA: Diagnosis not present

## 2017-11-12 DIAGNOSIS — R590 Localized enlarged lymph nodes: Secondary | ICD-10-CM | POA: Diagnosis not present

## 2017-11-12 DIAGNOSIS — R042 Hemoptysis: Secondary | ICD-10-CM | POA: Diagnosis not present

## 2017-11-12 DIAGNOSIS — D539 Nutritional anemia, unspecified: Secondary | ICD-10-CM | POA: Diagnosis not present

## 2017-11-12 DIAGNOSIS — D6859 Other primary thrombophilia: Secondary | ICD-10-CM | POA: Diagnosis not present

## 2017-11-12 DIAGNOSIS — Z7901 Long term (current) use of anticoagulants: Secondary | ICD-10-CM | POA: Diagnosis not present

## 2017-11-12 DIAGNOSIS — I878 Other specified disorders of veins: Secondary | ICD-10-CM | POA: Diagnosis not present

## 2017-11-12 DIAGNOSIS — M542 Cervicalgia: Secondary | ICD-10-CM | POA: Diagnosis not present

## 2017-11-12 DIAGNOSIS — D571 Sickle-cell disease without crisis: Secondary | ICD-10-CM | POA: Diagnosis not present

## 2017-11-12 DIAGNOSIS — M1990 Unspecified osteoarthritis, unspecified site: Secondary | ICD-10-CM | POA: Diagnosis not present

## 2017-11-12 DIAGNOSIS — D5700 Hb-SS disease with crisis, unspecified: Secondary | ICD-10-CM | POA: Diagnosis not present

## 2017-11-12 DIAGNOSIS — G40909 Epilepsy, unspecified, not intractable, without status epilepticus: Secondary | ICD-10-CM | POA: Diagnosis not present

## 2017-11-12 DIAGNOSIS — F329 Major depressive disorder, single episode, unspecified: Secondary | ICD-10-CM | POA: Diagnosis not present

## 2017-11-12 DIAGNOSIS — K219 Gastro-esophageal reflux disease without esophagitis: Secondary | ICD-10-CM | POA: Diagnosis not present

## 2017-11-12 DIAGNOSIS — R0602 Shortness of breath: Secondary | ICD-10-CM | POA: Diagnosis not present

## 2017-11-12 DIAGNOSIS — G43909 Migraine, unspecified, not intractable, without status migrainosus: Secondary | ICD-10-CM | POA: Diagnosis not present

## 2017-11-12 DIAGNOSIS — R0789 Other chest pain: Secondary | ICD-10-CM | POA: Diagnosis not present

## 2017-11-12 DIAGNOSIS — Z79899 Other long term (current) drug therapy: Secondary | ICD-10-CM | POA: Diagnosis not present

## 2017-11-12 DIAGNOSIS — R079 Chest pain, unspecified: Secondary | ICD-10-CM | POA: Diagnosis not present

## 2017-11-12 DIAGNOSIS — Z8673 Personal history of transient ischemic attack (TIA), and cerebral infarction without residual deficits: Secondary | ICD-10-CM | POA: Diagnosis not present

## 2017-11-13 DIAGNOSIS — R599 Enlarged lymph nodes, unspecified: Secondary | ICD-10-CM | POA: Diagnosis not present

## 2017-11-13 DIAGNOSIS — R079 Chest pain, unspecified: Secondary | ICD-10-CM | POA: Diagnosis not present

## 2017-11-13 DIAGNOSIS — D5700 Hb-SS disease with crisis, unspecified: Secondary | ICD-10-CM | POA: Diagnosis not present

## 2017-11-13 DIAGNOSIS — R0789 Other chest pain: Secondary | ICD-10-CM | POA: Diagnosis not present

## 2017-11-13 DIAGNOSIS — Z86718 Personal history of other venous thrombosis and embolism: Secondary | ICD-10-CM | POA: Diagnosis not present

## 2017-11-13 DIAGNOSIS — R042 Hemoptysis: Secondary | ICD-10-CM | POA: Diagnosis not present

## 2017-11-13 DIAGNOSIS — R0602 Shortness of breath: Secondary | ICD-10-CM | POA: Diagnosis not present

## 2018-08-12 ENCOUNTER — Inpatient Hospital Stay
Admit: 2018-08-12 | Discharge: 2018-08-12 | Disposition: A | Discharge status: Home / Self Care | Payer: MEDICARE | Attending: Emergency Medicine

## 2018-08-12 DIAGNOSIS — D5700 Hb-SS disease with crisis, unspecified: Secondary | ICD-10-CM

## 2018-08-12 LAB — METABOLIC PANEL, COMPREHENSIVE
A-G Ratio: 0.9 — ABNORMAL LOW (ref 1.2–3.5)
ALT (SGPT): 16 U/L (ref 12–65)
AST (SGOT): 12 U/L — ABNORMAL LOW (ref 15–37)
Albumin: 3.9 g/dL (ref 3.5–5.0)
Alk. phosphatase: 117 U/L (ref 50–136)
Anion gap: 9 mmol/L (ref 7–16)
BUN: 6 MG/DL (ref 6–23)
Bilirubin, total: 0.6 MG/DL (ref 0.2–1.1)
CO2: 25 mmol/L (ref 21–32)
Calcium: 8.9 MG/DL (ref 8.3–10.4)
Chloride: 108 mmol/L — ABNORMAL HIGH (ref 98–107)
Creatinine: 0.99 MG/DL (ref 0.6–1.0)
GFR est AA: >60 mL/min/{1.73_m2} (ref >60)
GFR est non-AA: >60 mL/min/{1.73_m2} (ref >60)
Globulin: 4.4 g/dL — ABNORMAL HIGH (ref 2.3–3.5)
Glucose: 100 mg/dL (ref 65–100)
Potassium: 3.6 mmol/L (ref 3.5–5.1)
Protein, total: 8.3 g/dL — ABNORMAL HIGH (ref 6.3–8.2)
Sodium: 142 mmol/L (ref 136–145)

## 2018-08-12 LAB — CBC WITH AUTOMATED DIFF
ABS. BASOPHILS: 0 10*3/uL (ref 0.0–0.2)
ABS. EOSINOPHILS: 0 10*3/uL (ref 0.0–0.8)
ABS. IMM. GRANS.: 0 10*3/uL (ref 0.0–0.5)
ABS. LYMPHOCYTES: 2 10*3/uL (ref 0.5–4.6)
ABS. MONOCYTES: 0.5 10*3/uL (ref 0.1–1.3)
ABS. NEUTROPHILS: 2.5 10*3/uL (ref 1.7–8.2)
ABSOLUTE NRBC: 1.73 10*3/uL — ABNORMAL HIGH (ref 0.0–0.2)
BASOPHILS: 0 % (ref 0.0–2.0)
EOSINOPHILS: 0 % — ABNORMAL LOW (ref 0.5–7.8)
HCT: 19.7 % — CL (ref 35.8–46.3)
HGB: 7 g/dL — ABNORMAL LOW (ref 11.7–15.4)
IMMATURE GRANULOCYTES: 0 % (ref 0.0–5.0)
LYMPHOCYTES: 40 % (ref 13–44)
MCH: 47.9 PG — ABNORMAL HIGH (ref 26.1–32.9)
MCHC: 35.5 g/dL — ABNORMAL HIGH (ref 31.4–35.0)
MCV: 134.9 FL — ABNORMAL HIGH (ref 79.6–97.8)
MONOCYTES: 10 % (ref 4.0–12.0)
MPV: 10.9 FL (ref 9.4–12.3)
NEUTROPHILS: 50 % (ref 43–78)
PLATELET: 325 10*3/uL (ref 150–450)
RBC: 1.46 M/uL — ABNORMAL LOW (ref 4.05–5.2)
RDW: 16.6 % — ABNORMAL HIGH (ref 11.9–14.6)
WBC: 5.1 10*3/uL (ref 4.3–11.1)

## 2018-08-12 LAB — URINE MICROSCOPIC
BACTERIA, URINE: 0 /hpf
Bacteria: 0 /hpf

## 2018-08-12 LAB — RETICULOCYTE COUNT
Absolute Retic Cnt.: 0.0345 M/ul (ref 0.026–0.095)
Absolute Retic Cnt.: 0.0345 M/ul (ref 0.026–0.095)
Immature Retic Fraction: 23.8 % — ABNORMAL HIGH (ref 3.0–15.9)
Immature Retic Fraction: 23.8 % — ABNORMAL HIGH (ref 3.0–15.9)
Retic Ct Pct: 2.4 % — ABNORMAL HIGH (ref 0.3–2.0)
Retic Hgb Conc.: 38 pg — ABNORMAL HIGH (ref 29–35)
Retic Hgb Conc.: 38 pg — ABNORMAL HIGH (ref 29–35)
Reticulocyte count: 2.4 % — ABNORMAL HIGH (ref 0.3–2.0)

## 2018-08-12 LAB — COMPREHENSIVE METABOLIC PANEL
ALT: 16 U/L (ref 12–65)
AST: 12 U/L — ABNORMAL LOW (ref 15–37)
Albumin/Globulin Ratio: 0.9 — ABNORMAL LOW (ref 1.2–3.5)
Albumin: 3.9 g/dL (ref 3.5–5.0)
Alkaline Phosphatase: 117 U/L (ref 50–136)
Anion Gap: 9 mmol/L (ref 7–16)
BUN: 6 MG/DL (ref 6–23)
CO2: 25 mmol/L (ref 21–32)
Calcium: 8.9 MG/DL (ref 8.3–10.4)
Chloride: 108 mmol/L — ABNORMAL HIGH (ref 98–107)
Creatinine: 0.99 MG/DL (ref 0.6–1.0)
EGFR IF NonAfrican American: >60 mL/min/{1.73_m2} (ref >60)
GFR African American: >60 mL/min/{1.73_m2} (ref >60)
Globulin: 4.4 g/dL — ABNORMAL HIGH (ref 2.3–3.5)
Glucose: 100 mg/dL (ref 65–100)
Potassium: 3.6 mmol/L (ref 3.5–5.1)
Sodium: 142 mmol/L (ref 136–145)
Total Bilirubin: 0.6 MG/DL (ref 0.2–1.1)
Total Protein: 8.3 g/dL — ABNORMAL HIGH (ref 6.3–8.2)

## 2018-08-12 LAB — CBC WITH AUTO DIFFERENTIAL
Basophils %: 0 % (ref 0.0–2.0)
Basophils Absolute: 0 10*3/uL (ref 0.0–0.2)
Eosinophils %: 0 % — ABNORMAL LOW (ref 0.5–7.8)
Eosinophils Absolute: 0 10*3/uL (ref 0.0–0.8)
Granulocyte Absolute Count: 0 10*3/uL (ref 0.0–0.5)
Hematocrit: 19.7 % — CL (ref 35.8–46.3)
Hemoglobin: 7 g/dL — ABNORMAL LOW (ref 11.7–15.4)
Immature Granulocytes: 0 % (ref 0.0–5.0)
Lymphocytes %: 40 % (ref 13–44)
Lymphocytes Absolute: 2 10*3/uL (ref 0.5–4.6)
MCH: 47.9 PG — ABNORMAL HIGH (ref 26.1–32.9)
MCHC: 35.5 g/dL — ABNORMAL HIGH (ref 31.4–35.0)
MCV: 134.9 FL — ABNORMAL HIGH (ref 79.6–97.8)
MPV: 10.9 FL (ref 9.4–12.3)
Monocytes %: 10 % (ref 4.0–12.0)
Monocytes Absolute: 0.5 10*3/uL (ref 0.1–1.3)
NRBC Absolute: 1.73 10*3/uL — ABNORMAL HIGH (ref 0.0–0.2)
Neutrophils %: 50 % (ref 43–78)
Neutrophils Absolute: 2.5 10*3/uL (ref 1.7–8.2)
Platelets: 325 10*3/uL (ref 150–450)
RBC: 1.46 M/uL — ABNORMAL LOW (ref 4.05–5.2)
RDW: 16.6 % — ABNORMAL HIGH (ref 11.9–14.6)
WBC: 5.1 10*3/uL (ref 4.3–11.1)

## 2018-08-12 MED ORDER — SODIUM CHLORIDE 0.9% BOLUS IV
0.9 % | Freq: Once | INTRAVENOUS | Status: AC
Start: 2018-08-12 — End: 2018-08-12
  Administered 2018-08-12: 09:00:00 via INTRAVENOUS

## 2018-08-12 MED ORDER — TRIMETHOPRIM-SULFAMETHOXAZOLE 160 MG-800 MG TAB
160-800 mg | ORAL | Status: DC
Start: 2018-08-12 — End: 2018-08-12

## 2018-08-12 MED ORDER — ONDANSETRON (PF) 4 MG/2 ML INJECTION
4 mg/2 mL | INTRAMUSCULAR | Status: AC
Start: 2018-08-12 — End: 2018-08-12
  Administered 2018-08-12: 09:00:00 via INTRAVENOUS

## 2018-08-12 MED ORDER — TRIMETHOPRIM-SULFAMETHOXAZOLE 160 MG-800 MG TAB
160-800 mg | ORAL_TABLET | Freq: Two times a day (BID) | ORAL | 0 refills | Status: AC
Start: 2018-08-12 — End: 2018-08-19

## 2018-08-12 MED ORDER — KETOROLAC TROMETHAMINE 30 MG/ML INJECTION
30 mg/mL (1 mL) | INTRAMUSCULAR | Status: AC
Start: 2018-08-12 — End: 2018-08-12
  Administered 2018-08-12: 09:00:00 via INTRAVENOUS

## 2018-08-12 MED ORDER — OXYCODONE-ACETAMINOPHEN 5 MG-325 MG TAB
5-325 mg | ORAL_TABLET | ORAL | 0 refills | Status: AC | PRN
Start: 2018-08-12 — End: 2018-08-15

## 2018-08-12 MED ORDER — HYDROMORPHONE (PF) 1 MG/ML IJ SOLN
1 mg/mL | INTRAMUSCULAR | Status: AC
Start: 2018-08-12 — End: 2018-08-12
  Administered 2018-08-12: 09:00:00 via INTRAVENOUS

## 2018-08-12 MED ORDER — DIPHENHYDRAMINE HCL 50 MG/ML IJ SOLN
50 mg/mL | INTRAMUSCULAR | Status: AC
Start: 2018-08-12 — End: 2018-08-12
  Administered 2018-08-12: 09:00:00 via INTRAVENOUS

## 2018-08-12 MED FILL — DIPHENHYDRAMINE HCL 50 MG/ML IJ SOLN: 50 mg/mL | INTRAMUSCULAR | Qty: 1

## 2018-08-12 MED FILL — TRIMETHOPRIM-SULFAMETHOXAZOLE 160 MG-800 MG TAB: 160-800 mg | ORAL | Qty: 1

## 2018-08-12 MED FILL — HYDROMORPHONE (PF) 1 MG/ML IJ SOLN: 1 mg/mL | INTRAMUSCULAR | Qty: 1

## 2018-08-12 MED FILL — ONDANSETRON (PF) 4 MG/2 ML INJECTION: 4 mg/2 mL | INTRAMUSCULAR | Qty: 2

## 2018-08-12 MED FILL — KETOROLAC TROMETHAMINE 30 MG/ML INJECTION: 30 mg/mL (1 mL) | INTRAMUSCULAR | Qty: 1

## 2018-08-12 NOTE — ED Notes (Signed)
Pt states she has been having generalized body aches over the pass few weeks. States she also has strong smelling urine. Denies pain with urination. States some nausea. Denies chest px or SHOB. Denies cough or congestion.

## 2018-08-12 NOTE — ED Notes (Signed)
Pt needs line when in room;no IV access or blood

## 2018-08-12 NOTE — ED Notes (Signed)
Report given to Amanda G., RN for continuation of care

## 2018-08-12 NOTE — ED Notes (Signed)
I have reviewed discharge instructions with the patient.  The patient verbalized understanding.    Patient left ED via Discharge Method: ambulatory to Home with boyfriend.    Opportunity for questions and clarification provided.       Patient given 2 scripts.         To continue your aftercare when you leave the hospital, you may receive an automated call from our care team to check in on how you are doing.  This is a free service and part of our promise to provide the best care and service to meet your aftercare needs." If you have questions, or wish to unsubscribe from this service please call [phone].  Thank you for Choosing our [facility] Emergency Department.

## 2018-08-12 NOTE — ED Provider Notes (Signed)
42 year old female with a history of sickle cell disease  Presents with generalized aches in all 4 extremities and chest wall  She denies any shortness of breath she has no swelling no headache  No recent fever vomiting or diarrhea      The history is provided by the patient.   Generalized Body Aches   This is a recurrent problem. The current episode started 12 to 24 hours ago. The problem occurs constantly. The problem has not changed since onset.Associated symptoms include chest pain. Pertinent negatives include no abdominal pain, no headaches and no shortness of breath. The symptoms are aggravated by standing and exertion. Nothing relieves the symptoms. She has tried nothing for the symptoms.        No past medical history on file.    No past surgical history on file.      No family history on file.    Social History     Socioeconomic History   ??? Marital status: SINGLE     Spouse name: Not on file   ??? Number of children: Not on file   ??? Years of education: Not on file   ??? Highest education level: Not on file   Occupational History   ??? Not on file   Social Needs   ??? Financial resource strain: Not on file   ??? Food insecurity:     Worry: Not on file     Inability: Not on file   ??? Transportation needs:     Medical: Not on file     Non-medical: Not on file   Tobacco Use   ??? Smoking status: Not on file   Substance and Sexual Activity   ??? Alcohol use: Not on file   ??? Drug use: Not on file   ??? Sexual activity: Not on file   Lifestyle   ??? Physical activity:     Days per week: Not on file     Minutes per session: Not on file   ??? Stress: Not on file   Relationships   ??? Social connections:     Talks on phone: Not on file     Gets together: Not on file     Attends religious service: Not on file     Active member of club or organization: Not on file     Attends meetings of clubs or organizations: Not on file     Relationship status: Not on file   ??? Intimate partner violence:     Fear of current or ex partner: Not on file      Emotionally abused: Not on file     Physically abused: Not on file     Forced sexual activity: Not on file   Other Topics Concern   ??? Not on file   Social History Narrative   ??? Not on file         ALLERGIES: Demerol [meperidine]; Fentanyl; Morphine; and Oxycontin [oxycodone]    Review of Systems   Constitutional: Negative for chills and fever.   HENT: Negative for congestion, ear pain and rhinorrhea.    Eyes: Negative for photophobia and discharge.   Respiratory: Negative for cough and shortness of breath.    Cardiovascular: Positive for chest pain. Negative for palpitations.   Gastrointestinal: Negative for abdominal pain, constipation, diarrhea and vomiting.   Endocrine: Negative for cold intolerance and heat intolerance.   Genitourinary: Negative for dysuria and flank pain.   Musculoskeletal: Positive for arthralgias and myalgias. Negative for  neck pain.   Skin: Negative for rash and wound.   Allergic/Immunologic: Negative for environmental allergies and food allergies.   Neurological: Negative for syncope and headaches.   Hematological: Negative for adenopathy. Does not bruise/bleed easily.   Psychiatric/Behavioral: Positive for dysphoric mood. The patient is not nervous/anxious.    All other systems reviewed and are negative.      Vitals:    08/12/18 0110 08/12/18 0322   BP: 122/77 98/64   Pulse: 84 93   Resp: 18 18   Temp: 98.7 ??F (37.1 ??C)    SpO2: 99% 98%   Weight: 74.4 kg (164 lb)    Height: 5\' 8"  (1.727 m)             Physical Exam   Constitutional: She is oriented to person, place, and time. She appears well-developed and well-nourished. She appears distressed.   HENT:   Head: Normocephalic and atraumatic.   Mouth/Throat: Oropharynx is clear and moist. No oropharyngeal exudate.   Eyes: Pupils are equal, round, and reactive to light. Conjunctivae and EOM are normal.   Neck: Normal range of motion. Neck supple. No JVD present.   Cardiovascular: Normal rate, regular rhythm, normal heart sounds and intact  distal pulses. Exam reveals no gallop and no friction rub.   No murmur heard.  Pulmonary/Chest: Effort normal and breath sounds normal.   Chest wall is tender to palpation without crepitance   Abdominal: Soft. Normal appearance and bowel sounds are normal. She exhibits no distension and no mass. There is no hepatosplenomegaly. There is no tenderness.   Musculoskeletal: Normal range of motion. She exhibits tenderness. She exhibits no edema or deformity.   Neurological: She is alert and oriented to person, place, and time. She has normal strength. No cranial nerve deficit or sensory deficit. She displays a negative Romberg sign. Gait normal.   Skin: Skin is warm and dry. Capillary refill takes less than 2 seconds. No rash noted.   Psychiatric: Her speech is normal and behavior is normal. Judgment and thought content normal. Her affect is blunt. Cognition and memory are normal.   Nursing note and vitals reviewed.       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  Patient does not report any dyspnea or other neurologic findings no recent seizures  Patient told me that she does not have a primary care doctor, however, epic records show that she has been seen several times at [facility]  Patient's controlled substance prescription records reviewed @ the [facility] PMP Aware website.  Information will be utilized for accurate and appropriate patient care.  07/04/2018   5   07/04/2018   Oxycodone-Acetaminophen 5-325         30.00  10  Sharlyne Pacas   [id number]   [facility] (1811)   1/1  22.50 [place]   06/05/2018   5   06/05/2018   Oxycodone-Acetaminophen 5-325         30.00  10  Ma Har   [id number]   Wal (1811)   1/1  22.50 [place]   05/26/2018   5   05/26/2018    Oxycodone-Acetaminophen 5-325         20.00  6  Ma Har   [id number]   Wal [phone]   1/1  25.00 [place]   02/28/2018   5   02/26/2018   Hydrocodone-Acetamin 5-325 Mg         15.00  4  He Mil   [id number]   Wal ([id number])   1/1  18.75 [place]   01/04/2018   5   01/04/2018   Oxycodone-Acetaminophen 5-325         20.00  5  Ma She   [id number]   Wal (1811)   1/1  30.00 [place]   11/13/2017   4   11/13/2017   Methadone Hcl 5 Mg Tablet         14.00  7  Ja Shu   [id number]   Wal (1811)   1/1  30.00 [place]   08/20/2017   4   08/20/2017   Oxycodone Hcl 5 Mg Tablet         30.00  7  Ki Edw   [id number]   Wal (1811)   1/1  32.14 [place]   07/18/2017   4   07/16/2017   Hydrocodone-Acetamin 5-325 Mg         15.00  4  Pa Cou   [id number]   Wal (1811)   1/1  18.75 [place]   06/08/2017   1   05/02/2017   Vimpat 100 Mg Tablet         60.00  30  Anda Latina   [id number]   [facility] [id number])   1/1  0.80 LME  Other   [place]   05/04/2017   1   05/02/2017   Vimpat 100 Mg Tablet         60.00  30  Anda Latina   [id number]   [facility] [phone]   1/1  0.80 LME  Other   [place]   04/10/2017   1   03/06/2017   Vimpat 50 Mg Tablet         60.00  30  Anda Latina   [id number]   [facility] [phone])   1/1  0.40 LME  Other   [place]   03/06/2017   1   03/06/2017   Vimpat 50 Mg Tablet         60.00  30  Anda Latina   [id number]   [facility] [id number])   1/1  0.40 LME  Other   [place]   02/05/2017   1   12/24/2016   Vimpat 50 Mg Tablet         120.00  30  Anda Latina   [id number]   [facility]ou [phone])   1/1  0.80 LME  Other   [place]   12/26/2016   1   12/24/2016   Vimpat 50 Mg Tablet         120.00  37  Anda Latina   [id number]   [facility] [phone])   1/1  0.65 LME  [place]   [place]   11/27/2016   3   11/27/2016   Acetaminophen-Cod #3 Tablet         16.00  2  Ra Dav   [id number]   Wal (0429)   1/1  36.00 MME  Other   [place]   10/23/2016   2   10/23/2016   Hydrocodone-Acetamin 7.5-325  18.00  3  Ch Smi   [id number]   Wal (0429)   1/1  45.00 MME  Other   [place]          Amount and/or Complexity of Data Reviewed  Clinical lab tests: ordered and  reviewed  Tests in the medicine section of CPT??: ordered and reviewed  Review and summarize past medical records: yes    Risk of Complications, Morbidity, and/or Mortality  Presenting problems: moderate  Diagnostic procedures: moderate  Management options: moderate  General comments: Elements of this note have been dictated via voice recognition software.  Text and phrases may be limited by the accuracy of the software.  The chart has been reviewed, but errors may still be present.      Patient Progress  Patient progress: improved         Procedures

## 2018-08-12 NOTE — ED Notes (Signed)
Pt needs line when in room;no IV access or blood

## 2018-08-12 NOTE — ED Notes (Signed)
I have reviewed discharge instructions with the patient.  The patient verbalized understanding.    Patient left ED via Discharge Method: ambulatory to Home with boyfriend.    Opportunity for questions and clarification provided.       Patient given 2 scripts.         To continue your aftercare when you leave the hospital, you may receive an automated call from our care team to check in on how you are doing.  This is a free service and part of our promise to provide the best care and service to meet your aftercare needs.??? If you have questions, or wish to unsubscribe from this service please call [phone].  Thank you for Choosing our [facility].

## 2018-08-12 NOTE — ED Triage Notes (Signed)
Pt states she has been having generalized body aches over the pass few weeks. States she also has strong smelling urine. Denies pain with urination. States some nausea. Denies chest px or SHOB. Denies cough or congestion.

## 2018-08-12 NOTE — ED Provider Notes (Signed)
42 year old female with a history of sickle cell disease  Presents with generalized aches in all 4 extremities and chest wall  She denies any shortness of breath she has no swelling no headache  No recent fever vomiting or diarrhea      The history is provided by the patient.   Generalized Body Aches   This is a recurrent problem. The current episode started 12 to 24 hours ago. The problem occurs constantly. The problem has not changed since onset.Associated symptoms include chest pain. Pertinent negatives include no abdominal pain, no headaches and no shortness of breath. The symptoms are aggravated by standing and exertion. Nothing relieves the symptoms. She has tried nothing for the symptoms.        No past medical history on file.    No past surgical history on file.      No family history on file.    Social History     Socioeconomic History   ??? Marital status: SINGLE     Spouse name: Not on file   ??? Number of children: Not on file   ??? Years of education: Not on file   ??? Highest education level: Not on file   Occupational History   ??? Not on file   Social Needs   ??? Financial resource strain: Not on file   ??? Food insecurity:     Worry: Not on file     Inability: Not on file   ??? Transportation needs:     Medical: Not on file     Non-medical: Not on file   Tobacco Use   ??? Smoking status: Not on file   Substance and Sexual Activity   ??? Alcohol use: Not on file   ??? Drug use: Not on file   ??? Sexual activity: Not on file   Lifestyle   ??? Physical activity:     Days per week: Not on file     Minutes per session: Not on file   ??? Stress: Not on file   Relationships   ??? Social connections:     Talks on phone: Not on file     Gets together: Not on file     Attends religious service: Not on file     Active member of club or organization: Not on file     Attends meetings of clubs or organizations: Not on file     Relationship status: Not on file   ??? Intimate partner violence:     Fear of current or ex partner: Not on file      Emotionally abused: Not on file     Physically abused: Not on file     Forced sexual activity: Not on file   Other Topics Concern   ??? Not on file   Social History Narrative   ??? Not on file         ALLERGIES: Demerol [meperidine]; Fentanyl; Morphine; and Oxycontin [oxycodone]    Review of Systems   Constitutional: Negative for chills and fever.   HENT: Negative for congestion, ear pain and rhinorrhea.    Eyes: Negative for photophobia and discharge.   Respiratory: Negative for cough and shortness of breath.    Cardiovascular: Positive for chest pain. Negative for palpitations.   Gastrointestinal: Negative for abdominal pain, constipation, diarrhea and vomiting.   Endocrine: Negative for cold intolerance and heat intolerance.   Genitourinary: Negative for dysuria and flank pain.   Musculoskeletal: Positive for arthralgias and myalgias. Negative for  neck pain.   Skin: Negative for rash and wound.   Allergic/Immunologic: Negative for environmental allergies and food allergies.   Neurological: Negative for syncope and headaches.   Hematological: Negative for adenopathy. Does not bruise/bleed easily.   Psychiatric/Behavioral: Positive for dysphoric mood. The patient is not nervous/anxious.    All other systems reviewed and are negative.      Vitals:    08/12/18 0110 08/12/18 0322   BP: 122/77 98/64   Pulse: 84 93   Resp: 18 18   Temp: 98.7 ??F (37.1 ??C)    SpO2: 99% 98%   Weight: 74.4 kg (164 lb)    Height: 5\' 8"  (1.727 m)             Physical Exam   Constitutional: She is oriented to person, place, and time. She appears well-developed and well-nourished. She appears distressed.   HENT:   Head: Normocephalic and atraumatic.   Mouth/Throat: Oropharynx is clear and moist. No oropharyngeal exudate.   Eyes: Pupils are equal, round, and reactive to light. Conjunctivae and EOM are normal.   Neck: Normal range of motion. Neck supple. No JVD present.   Cardiovascular: Normal rate, regular rhythm, normal heart sounds and  intact distal pulses. Exam reveals no gallop and no friction rub.   No murmur heard.  Pulmonary/Chest: Effort normal and breath sounds normal.   Chest wall is tender to palpation without crepitance   Abdominal: Soft. Normal appearance and bowel sounds are normal. She exhibits no distension and no mass. There is no hepatosplenomegaly. There is no tenderness.   Musculoskeletal: Normal range of motion. She exhibits tenderness. She exhibits no edema or deformity.   Neurological: She is alert and oriented to person, place, and time. She has normal strength. No cranial nerve deficit or sensory deficit. She displays a negative Romberg sign. Gait normal.   Skin: Skin is warm and dry. Capillary refill takes less than 2 seconds. No rash noted.   Psychiatric: Her speech is normal and behavior is normal. Judgment and thought content normal. Her affect is blunt. Cognition and memory are normal.   Nursing note and vitals reviewed.       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  Patient does not report any dyspnea or other neurologic findings no recent seizures  Patient told me that she does not have a primary care doctor, however, epic records show that she has been seen several times at [facility]  Patient's controlled substance prescription records reviewed @ the [facility] PMP Aware website.  Information will be utilized for accurate and appropriate patient care.  07/04/2018   5   07/04/2018   Oxycodone-Acetaminophen 5-325         30.00  10  Sharlyne Pacas   [id number]   Wal (1811)   1/1  22.50 [place]   06/05/2018   5   06/05/2018   Oxycodone-Acetaminophen 5-325         30.00  10  Ma Har   [id number]   Wal (1811)   1/1  22.50 [place]    05/26/2018   5   05/26/2018   Oxycodone-Acetaminophen 5-325         20.00  6  Ma Har   [id number]   Wal [phone]   1/1  25.00 [place]   02/28/2018   5   02/26/2018   Hydrocodone-Acetamin 5-325 Mg         15.00  4  He Mil   [id number]   Wal ([id number])   1/1  18.75 [place]   01/04/2018   5   01/04/2018   Oxycodone-Acetaminophen 5-325         20.00  5  Ma She   [id number]   Wal (1811)   1/1  30.00 [place]   11/13/2017   4   11/13/2017   Methadone Hcl 5 Mg Tablet         14.00  7  Ja Shu   [id number]   Wal (1811)   1/1  30.00 [place]   08/20/2017   4   08/20/2017   Oxycodone Hcl 5 Mg Tablet         30.00  7  Ki Edw   [id number]   Wal (1811)   1/1  32.14 [place]   07/18/2017   4   07/16/2017   Hydrocodone-Acetamin 5-325 Mg         15.00  4  Pa Cou   [id number]   Wal (1811)   1/1  18.75 [place]   06/08/2017   1   05/02/2017   Vimpat 100 Mg Tablet         60.00  30  Anda Latina   [id number]   [facility] [id number])   1/1  0.80 LME  Other   [place]   05/04/2017   1   05/02/2017   Vimpat 100 Mg Tablet         60.00  30  Anda Latina   [id number]   Hou [phone])   1/1  0.80 LME  Other   [place]   04/10/2017   1   03/06/2017   Vimpat 50 Mg Tablet         60.00  30  Anda Latina   [id number]   [facility] [phone])   1/1  0.40 LME  [place]   [place]   03/06/2017   1   03/06/2017   Vimpat 50 Mg Tablet         60.00  30  Anda Latina   [id number]   [facility]ou [id number])   1/1  0.40 LME  [place]   [place]   02/05/2017   1   12/24/2016   Vimpat 50 Mg Tablet         120.00  30  Anda Latina   [id number]   Hou [phone])   1/1  0.80 LME  [place]   [place]   12/26/2016   1   12/24/2016   Vimpat 50 Mg Tablet         120.00  37  Anda Latina   [id number]   [facility]ou [phone])   1/1  0.65 LME  [place]   [place]   11/27/2016   3   11/27/2016   Acetaminophen-Cod #3 Tablet         16.00  2  Ra Dav   [id number]   Wal (0429)   1/1  36.00 M[place]  [place]   [place]   10/23/2016   2   10/23/2016   Hydrocodone-Acetamin 7.5-325  18.00  3  Ch Smi   [id number]   Wal (0429)   1/1  45.00 MME  Other   [place]          Amount and/or Complexity of Data Reviewed   Clinical lab tests: ordered and reviewed  Tests in the medicine section of CPT??: ordered and reviewed  Review and summarize past medical records: yes    Risk of Complications, Morbidity, and/or Mortality  Presenting problems: moderate  Diagnostic procedures: moderate  Management options: moderate  General comments: Elements of this note have been dictated via voice recognition software.  Text and phrases may be limited by the accuracy of the software.  The chart has been reviewed, but errors may still be present.      Patient Progress  Patient progress: improved         Procedures

## 2020-01-28 ENCOUNTER — Emergency Department: Admit: 2020-01-29 | Payer: MEDICARE | Primary: Family Medicine

## 2020-01-28 DIAGNOSIS — D5700 Hb-SS disease with crisis, unspecified: Secondary | ICD-10-CM

## 2020-01-28 NOTE — ED Provider Notes (Signed)
Patient is a 44 year old female with history of sickle cell anemia on chronic anticoagulation because of previous blood clots who presents to the ER tonight complaining of chest pain.  She states that about 5 hours ago she started having substernal chest tightness which radiated to the right arm.  Also felt short of breath.  Denies any trauma or injury.  No fever.  She thinks she might be in sickle crisis.      Chest Pain   This is a new problem. The current episode started 6 to 12 hours ago. The problem has not changed since onset.The problem occurs constantly. The pain is associated with normal activity. The pain is present in the substernal region. The pain is moderate. The quality of the pain is described as tightness. The pain radiates to the right shoulder and right arm. Associated symptoms include shortness of breath. Pertinent negatives include no abdominal pain, no back pain, no cough, no diaphoresis, no fever, no headaches, no malaise/fatigue, no nausea, no palpitations, no sputum production, no vomiting and no weakness. She has tried nothing for the symptoms. The treatment provided no relief. Her past medical history does not include DM or HTN.        No past medical history on file.    No past surgical history on file.      No family history on file.    Social History     Socioeconomic History   ??? Marital status: SINGLE     Spouse name: Not on file   ??? Number of children: Not on file   ??? Years of education: Not on file   ??? Highest education level: Not on file   Occupational History   ??? Not on file   Social Needs   ??? Financial resource strain: Not on file   ??? Food insecurity     Worry: Not on file     Inability: Not on file   ??? Transportation needs     Medical: Not on file     Non-medical: Not on file   Tobacco Use   ??? Smoking status: Not on file   Substance and Sexual Activity   ??? Alcohol use: Not on file   ??? Drug use: Not on file   ??? Sexual activity: Not on file   Lifestyle   ??? Physical activity      Days per week: Not on file     Minutes per session: Not on file   ??? Stress: Not on file   Relationships   ??? Social Wellsite geologist on phone: Not on file     Gets together: Not on file     Attends religious service: Not on file     Active member of club or organization: Not on file     Attends meetings of clubs or organizations: Not on file     Relationship status: Not on file   ??? Intimate partner violence     Fear of current or ex partner: Not on file     Emotionally abused: Not on file     Physically abused: Not on file     Forced sexual activity: Not on file   Other Topics Concern   ??? Not on file   Social History Narrative   ??? Not on file         ALLERGIES: Demerol [meperidine], Fentanyl, Morphine, and Oxycontin [oxycodone]    Review of Systems   Constitutional: Negative for chills,  diaphoresis, fatigue, fever and malaise/fatigue.   HENT: Negative for congestion, rhinorrhea and sore throat.    Eyes: Negative for pain, discharge and visual disturbance.   Respiratory: Positive for shortness of breath. Negative for cough and sputum production.    Cardiovascular: Positive for chest pain. Negative for palpitations.   Gastrointestinal: Negative for abdominal pain, diarrhea, nausea and vomiting.   Endocrine: Negative for polydipsia and polyuria.   Genitourinary: Negative for dysuria, frequency and urgency.   Musculoskeletal: Negative for back pain and neck pain.   Skin: Negative for color change and rash.   Neurological: Negative for seizures, syncope, weakness and headaches.   Hematological: Negative.        Vitals:    01/28/20 2330   BP: 133/70   Pulse: 91   Resp: 18   Temp: 98.4 ??F (36.9 ??C)   SpO2: 98%   Weight: 74.4 kg (164 lb)   Height: 5\' 8"  (1.727 m)            Physical Exam  Vitals signs and nursing note reviewed.   Constitutional:       Appearance: She is well-developed.   HENT:      Head: Normocephalic and atraumatic.   Eyes:      Conjunctiva/sclera: Conjunctivae normal.      Pupils: Pupils are equal,  round, and reactive to light.   Neck:      Musculoskeletal: Normal range of motion and neck supple.   Cardiovascular:      Rate and Rhythm: Normal rate and regular rhythm.      Heart sounds: Normal heart sounds.   Pulmonary:      Effort: Pulmonary effort is normal.      Breath sounds: Normal breath sounds. No wheezing.   Chest:      Chest wall: Tenderness present. No deformity or crepitus.   Abdominal:      Palpations: Abdomen is soft.      Tenderness: There is no abdominal tenderness. There is no guarding or rebound.   Musculoskeletal: Normal range of motion.         General: No deformity.   Lymphadenopathy:      Cervical: No cervical adenopathy.   Skin:     General: Skin is warm and dry.      Capillary Refill: Capillary refill takes less than 2 seconds.      Findings: No rash.   Neurological:      General: No focal deficit present.      Mental Status: She is alert and oriented to person, place, and time.      GCS: GCS eye subscore is 4. GCS verbal subscore is 5. GCS motor subscore is 6.      Cranial Nerves: No cranial nerve deficit.      Sensory: No sensory deficit.      Motor: No weakness.          MDM  Number of Diagnoses or Management Options  Diagnosis management comments: I wore appropriate PPE throughout this patient's ED visit. , MD, 11:38 PM      12:52 AM  differential diagnosis includes acute coronary syndrome, myocardial infarction, chest wall pain, pneumonia, pneumothorax, sickle cell crisis    Chest x-ray normal  White count normal, hemoglobin 10.7  Chemistries unremarkable  High-sensitivity troponin negative  Retake count elevated at 10    Patient was given some Zofran and Dilaudid she states she feels a little bit better but still in pain.  I she was  relieved with the negative work-up.  At this point she does not need transfusion I do not think she needs admission I think this is all from her sickle cell disease and chest wall pain she is asking for some Benadryl for itching and a  second dose of pain medication.      1:33 AM  Feeling better and ready to go home.    Voice dictation software was used during the making of this note.  This software is not perfect and grammatical and other typographical errors may be present.  This note has been proofread, but may still contain errors.  Abbe Amsterdam, MD; 01/29/2020 @1 :33 AM   ===================================================================         Amount and/or Complexity of Data Reviewed  Clinical lab tests: ordered and reviewed  Tests in the radiology section of CPT??: ordered and reviewed  Tests in the medicine section of CPT??: ordered and reviewed  Review and summarize past medical records: yes  Independent visualization of images, tracings, or specimens: yes    Risk of Complications, Morbidity, and/or Mortality  Presenting problems: moderate  Diagnostic procedures: low  Management options: low    Patient Progress  Patient progress: improved         EKG    Date/Time: 01/28/2020 11:39 PM  Performed by: Abbe Amsterdam, MD  Authorized by: Abbe Amsterdam, MD     ECG reviewed by ED Physician in the absence of a cardiologist: yes    Previous ECG:     Previous ECG:  Unavailable  Interpretation:     Interpretation: normal    Rate:     ECG rate:  85    ECG rate assessment: normal    Rhythm:     Rhythm: sinus rhythm    Ectopy:     Ectopy: none    QRS:     QRS axis:  Normal  Conduction:     Conduction: normal    ST segments:     ST segments:  Normal  T waves:     T waves: normal

## 2020-01-28 NOTE — ED Notes (Signed)
Patient states chest pain, shortness of breath, dizziness. States pain radiates to right arm. States this started Monday. States she has attempted tylenol for the pain.

## 2020-01-28 NOTE — ED Provider Notes (Signed)
Patient is a 44 year old female with history of sickle cell anemia on chronic anticoagulation because of previous blood clots who presents to the ER tonight complaining of chest pain.  She states that about 5 hours ago she started having substernal chest tightness which radiated to the right arm.  Also felt short of breath.  Denies any trauma or injury.  No fever.  She thinks she might be in sickle crisis.      Chest Pain   This is a new problem. The current episode started 6 to 12 hours ago. The problem has not changed since onset.The problem occurs constantly. The pain is associated with normal activity. The pain is present in the substernal region. The pain is moderate. The quality of the pain is described as tightness. The pain radiates to the right shoulder and right arm. Associated symptoms include shortness of breath. Pertinent negatives include no abdominal pain, no back pain, no cough, no diaphoresis, no fever, no headaches, no malaise/fatigue, no nausea, no palpitations, no sputum production, no vomiting and no weakness. She has tried nothing for the symptoms. The treatment provided no relief. Her past medical history does not include DM or HTN.        No past medical history on file.    No past surgical history on file.      No family history on file.    Social History     Socioeconomic History   ??? Marital status: SINGLE     Spouse name: Not on file   ??? Number of children: Not on file   ??? Years of education: Not on file   ??? Highest education level: Not on file   Occupational History   ??? Not on file   Social Needs   ??? Financial resource strain: Not on file   ??? Food insecurity     Worry: Not on file     Inability: Not on file   ??? Transportation needs     Medical: Not on file     Non-medical: Not on file   Tobacco Use   ??? Smoking status: Not on file   Substance and Sexual Activity   ??? Alcohol use: Not on file   ??? Drug use: Not on file   ??? Sexual activity: Not on file   Lifestyle   ??? Physical activity      Days per week: Not on file     Minutes per session: Not on file   ??? Stress: Not on file   Relationships   ??? Social Wellsite geologist on phone: Not on file     Gets together: Not on file     Attends religious service: Not on file     Active member of club or organization: Not on file     Attends meetings of clubs or organizations: Not on file     Relationship status: Not on file   ??? Intimate partner violence     Fear of current or ex partner: Not on file     Emotionally abused: Not on file     Physically abused: Not on file     Forced sexual activity: Not on file   Other Topics Concern   ??? Not on file   Social History Narrative   ??? Not on file         ALLERGIES: Demerol [meperidine], Fentanyl, Morphine, and Oxycontin [oxycodone]    Review of Systems   Constitutional: Negative for chills,  diaphoresis, fatigue, fever and malaise/fatigue.   HENT: Negative for congestion, rhinorrhea and sore throat.    Eyes: Negative for pain, discharge and visual disturbance.   Respiratory: Positive for shortness of breath. Negative for cough and sputum production.    Cardiovascular: Positive for chest pain. Negative for palpitations.   Gastrointestinal: Negative for abdominal pain, diarrhea, nausea and vomiting.   Endocrine: Negative for polydipsia and polyuria.   Genitourinary: Negative for dysuria, frequency and urgency.   Musculoskeletal: Negative for back pain and neck pain.   Skin: Negative for color change and rash.   Neurological: Negative for seizures, syncope, weakness and headaches.   Hematological: Negative.        Vitals:    01/28/20 2330   BP: 133/70   Pulse: 91   Resp: 18   Temp: 98.4 ??F (36.9 ??C)   SpO2: 98%   Weight: 74.4 kg (164 lb)   Height: 5\' 8"  (1.727 m)            Physical Exam  Vitals signs and nursing note reviewed.   Constitutional:       Appearance: She is well-developed.   HENT:      Head: Normocephalic and atraumatic.   Eyes:      Conjunctiva/sclera: Conjunctivae normal.       Pupils: Pupils are equal, round, and reactive to light.   Neck:      Musculoskeletal: Normal range of motion and neck supple.   Cardiovascular:      Rate and Rhythm: Normal rate and regular rhythm.      Heart sounds: Normal heart sounds.   Pulmonary:      Effort: Pulmonary effort is normal.      Breath sounds: Normal breath sounds. No wheezing.   Chest:      Chest wall: Tenderness present. No deformity or crepitus.   Abdominal:      Palpations: Abdomen is soft.      Tenderness: There is no abdominal tenderness. There is no guarding or rebound.   Musculoskeletal: Normal range of motion.         General: No deformity.   Lymphadenopathy:      Cervical: No cervical adenopathy.   Skin:     General: Skin is warm and dry.      Capillary Refill: Capillary refill takes less than 2 seconds.      Findings: No rash.   Neurological:      General: No focal deficit present.      Mental Status: She is alert and oriented to person, place, and time.      GCS: GCS eye subscore is 4. GCS verbal subscore is 5. GCS motor subscore is 6.      Cranial Nerves: No cranial nerve deficit.      Sensory: No sensory deficit.      Motor: No weakness.          MDM  Number of Diagnoses or Management Options  Diagnosis management comments: I wore appropriate PPE throughout this patient's ED visit. Abbe Amsterdam, MD, 11:38 PM      12:52 AM  differential diagnosis includes acute coronary syndrome, myocardial infarction, chest wall pain, pneumonia, pneumothorax, sickle cell crisis    Chest x-ray normal  White count normal, hemoglobin 10.7  Chemistries unremarkable  High-sensitivity troponin negative  Retake count elevated at 10     Patient was given some Zofran and Dilaudid she states she feels a little bit better but still in pain.  I she  was relieved with the negative work-up.  At this point she does not need transfusion I do not think she needs admission I think this is all from her sickle cell disease and chest wall pain she is asking for some Benadryl for itching and a second dose of pain medication.      1:33 AM  Feeling better and ready to go home.    Voice dictation software was used during the making of this note.  This software is not perfect and grammatical and other typographical errors may be present.  This note has been proofread, but may still contain errors.  Maryjean Ka, MD; 01/29/2020 @1 :33 AM   ===================================================================         Amount and/or Complexity of Data Reviewed  Clinical lab tests: ordered and reviewed  Tests in the radiology section of CPT??: ordered and reviewed  Tests in the medicine section of CPT??: ordered and reviewed  Review and summarize past medical records: yes  Independent visualization of images, tracings, or specimens: yes    Risk of Complications, Morbidity, and/or Mortality  Presenting problems: moderate  Diagnostic procedures: low  Management options: low    Patient Progress  Patient progress: improved         EKG    Date/Time: 01/28/2020 11:39 PM  Performed by: 01/30/2020, MD  Authorized by: Maryjean Ka, MD     ECG reviewed by ED Physician in the absence of a cardiologist: yes    Previous ECG:     Previous ECG:  Unavailable  Interpretation:     Interpretation: normal    Rate:     ECG rate:  85    ECG rate assessment: normal    Rhythm:     Rhythm: sinus rhythm    Ectopy:     Ectopy: none    QRS:     QRS axis:  Normal  Conduction:     Conduction: normal    ST segments:     ST segments:  Normal  T waves:     T waves: normal

## 2020-01-28 NOTE — ED Triage Notes (Addendum)
Patient states chest pain, shortness of breath, dizziness. States pain radiates to right arm. States this started Monday. States she has attempted tylenol for the pain.

## 2020-01-29 ENCOUNTER — Inpatient Hospital Stay
Admit: 2020-01-29 | Discharge: 2020-01-29 | Disposition: A | Discharge status: Home / Self Care | Payer: MEDICARE | Attending: Emergency Medicine

## 2020-01-29 LAB — METABOLIC PANEL, COMPREHENSIVE
A-G Ratio: 1 — ABNORMAL LOW (ref 1.2–3.5)
ALT (SGPT): 27 U/L (ref 12–65)
AST (SGOT): 35 U/L (ref 15–37)
Albumin: 4.2 g/dL (ref 3.5–5.0)
Alk. phosphatase: 108 U/L (ref 50–136)
Anion gap: 5 mmol/L — ABNORMAL LOW (ref 7–16)
BUN: 7 MG/DL (ref 6–23)
Bilirubin, total: 1.1 MG/DL (ref 0.2–1.1)
CO2: 25 mmol/L (ref 21–32)
Calcium: 9.3 MG/DL (ref 8.3–10.4)
Chloride: 111 mmol/L — ABNORMAL HIGH (ref 98–107)
Creatinine: 0.97 MG/DL (ref 0.6–1.0)
GFR est AA: >60 mL/min/{1.73_m2} (ref >60)
GFR est non-AA: >60 mL/min/{1.73_m2} (ref >60)
Globulin: 4.4 g/dL — ABNORMAL HIGH (ref 2.3–3.5)
Glucose: 106 mg/dL — ABNORMAL HIGH (ref 65–100)
Potassium: 4.1 mmol/L (ref 3.5–5.1)
Protein, total: 8.6 g/dL — ABNORMAL HIGH (ref 6.3–8.2)
Sodium: 141 mmol/L (ref 136–145)

## 2020-01-29 LAB — CBC WITH AUTOMATED DIFF
ABS. BASOPHILS: 0 10*3/uL (ref 0.0–0.2)
ABS. EOSINOPHILS: 0.1 10*3/uL (ref 0.0–0.8)
ABS. IMM. GRANS.: 0 10*3/uL (ref 0.0–0.5)
ABS. LYMPHOCYTES: 2.3 10*3/uL (ref 0.5–4.6)
ABS. MONOCYTES: 0.9 10*3/uL (ref 0.1–1.3)
ABS. NEUTROPHILS: 4.9 10*3/uL (ref 1.7–8.2)
ABSOLUTE NRBC: 0.46 10*3/uL — ABNORMAL HIGH (ref 0.0–0.2)
BASOPHILS: 0 % (ref 0.0–2.0)
EOSINOPHILS: 1 % (ref 0.5–7.8)
HCT: 29.2 % — ABNORMAL LOW (ref 35.8–46.3)
HGB: 10.7 g/dL — ABNORMAL LOW (ref 11.7–15.4)
IMMATURE GRANULOCYTES: 0 % (ref 0.0–5.0)
LYMPHOCYTES: 28 % (ref 13–44)
MCH: 40.7 PG — ABNORMAL HIGH (ref 26.1–32.9)
MCHC: 36.6 g/dL — ABNORMAL HIGH (ref 31.4–35.0)
MCV: 111 FL — ABNORMAL HIGH (ref 79.6–97.8)
MONOCYTES: 11 % (ref 4.0–12.0)
MPV: 8.9 FL — ABNORMAL LOW (ref 9.4–12.3)
NEUTROPHILS: 60 % (ref 43–78)
PLATELET: 411 10*3/uL (ref 150–450)
RBC: 2.63 M/uL — ABNORMAL LOW (ref 4.05–5.2)
RDW: 16.1 % — ABNORMAL HIGH (ref 11.9–14.6)
WBC: 8.2 10*3/uL (ref 4.3–11.1)

## 2020-01-29 LAB — EKG, 12 LEAD, INITIAL
Atrial Rate: 85 {beats}/min
Calculated P Axis: 69 degrees
Calculated R Axis: 55 degrees
Calculated T Axis: 44 degrees
P-R Interval: 148 ms
Q-T Interval: 364 ms
QRS Duration: 72 ms
QTC Calculation (Bezet): 433 ms
Ventricular Rate: 85 {beats}/min

## 2020-01-29 LAB — TROPONIN-HIGH SENSITIVITY: Troponin-High Sensitivity: 8.8 pg/mL (ref 0–14)

## 2020-01-29 LAB — RETICULOCYTE COUNT
Absolute Retic Cnt.: 0.2667 M/ul — ABNORMAL HIGH (ref 0.026–0.095)
Absolute Retic Cnt.: 0.2667 M/ul — ABNORMAL HIGH (ref 0.026–0.095)
Immature Retic Fraction: 32.6 % — ABNORMAL HIGH (ref 3.0–15.9)
Immature Retic Fraction: 32.6 % — ABNORMAL HIGH (ref 3.0–15.9)
Retic Ct Pct: 10.1 % — ABNORMAL HIGH (ref 0.3–2.0)
Retic Hgb Conc.: 40 pg — ABNORMAL HIGH (ref 29–35)
Retic Hgb Conc.: 40 pg — ABNORMAL HIGH (ref 29–35)
Reticulocyte count: 10.1 % — ABNORMAL HIGH (ref 0.3–2.0)

## 2020-01-29 LAB — CBC WITH AUTO DIFFERENTIAL
Basophils %: 0 % (ref 0.0–2.0)
Basophils Absolute: 0 10*3/uL (ref 0.0–0.2)
Eosinophils %: 1 % (ref 0.5–7.8)
Eosinophils Absolute: 0.1 10*3/uL (ref 0.0–0.8)
Granulocyte Absolute Count: 0 10*3/uL (ref 0.0–0.5)
Hematocrit: 29.2 % — ABNORMAL LOW (ref 35.8–46.3)
Hemoglobin: 10.7 g/dL — ABNORMAL LOW (ref 11.7–15.4)
Immature Granulocytes: 0 % (ref 0.0–5.0)
Lymphocytes %: 28 % (ref 13–44)
Lymphocytes Absolute: 2.3 10*3/uL (ref 0.5–4.6)
MCH: 40.7 PG — ABNORMAL HIGH (ref 26.1–32.9)
MCHC: 36.6 g/dL — ABNORMAL HIGH (ref 31.4–35.0)
MCV: 111 FL — ABNORMAL HIGH (ref 79.6–97.8)
MPV: 8.9 FL — ABNORMAL LOW (ref 9.4–12.3)
Monocytes %: 11 % (ref 4.0–12.0)
Monocytes Absolute: 0.9 10*3/uL (ref 0.1–1.3)
NRBC Absolute: 0.46 10*3/uL — ABNORMAL HIGH (ref 0.0–0.2)
Neutrophils %: 60 % (ref 43–78)
Neutrophils Absolute: 4.9 10*3/uL (ref 1.7–8.2)
Platelets: 411 10*3/uL (ref 150–450)
RBC: 2.63 M/uL — ABNORMAL LOW (ref 4.05–5.2)
RDW: 16.1 % — ABNORMAL HIGH (ref 11.9–14.6)
WBC: 8.2 10*3/uL (ref 4.3–11.1)

## 2020-01-29 LAB — EKG 12-LEAD
Atrial Rate: 85 {beats}/min
P Axis: 69 degrees
P-R Interval: 148 ms
Q-T Interval: 364 ms
QRS Duration: 72 ms
QTc Calculation (Bazett): 433 ms
R Axis: 55 degrees
T Axis: 44 degrees
Ventricular Rate: 85 {beats}/min

## 2020-01-29 LAB — TROPONIN, HIGH SENSITIVITY: Troponin, High Sensitivity: 8.8 pg/mL (ref 0–14)

## 2020-01-29 LAB — COMPREHENSIVE METABOLIC PANEL
ALT: 27 U/L (ref 12–65)
AST: 35 U/L (ref 15–37)
Albumin/Globulin Ratio: 1 — ABNORMAL LOW (ref 1.2–3.5)
Albumin: 4.2 g/dL (ref 3.5–5.0)
Alkaline Phosphatase: 108 U/L (ref 50–136)
Anion Gap: 5 mmol/L — ABNORMAL LOW (ref 7–16)
BUN: 7 MG/DL (ref 6–23)
CO2: 25 mmol/L (ref 21–32)
Calcium: 9.3 MG/DL (ref 8.3–10.4)
Chloride: 111 mmol/L — ABNORMAL HIGH (ref 98–107)
Creatinine: 0.97 MG/DL (ref 0.6–1.0)
EGFR IF NonAfrican American: >60 mL/min/{1.73_m2} (ref >60)
GFR African American: >60 mL/min/{1.73_m2} (ref >60)
Globulin: 4.4 g/dL — ABNORMAL HIGH (ref 2.3–3.5)
Glucose: 106 mg/dL — ABNORMAL HIGH (ref 65–100)
Potassium: 4.1 mmol/L (ref 3.5–5.1)
Sodium: 141 mmol/L (ref 136–145)
Total Bilirubin: 1.1 MG/DL (ref 0.2–1.1)
Total Protein: 8.6 g/dL — ABNORMAL HIGH (ref 6.3–8.2)

## 2020-01-29 MED ORDER — HYDROMORPHONE (PF) 1 MG/ML IJ SOLN
1 mg/mL | Freq: Once | INTRAMUSCULAR | Status: AC
Start: 2020-01-29 — End: 2020-01-29
  Administered 2020-01-29: 06:00:00 via INTRAVENOUS

## 2020-01-29 MED ORDER — DIPHENHYDRAMINE HCL 50 MG/ML IJ SOLN
50 mg/mL | INTRAMUSCULAR | Status: AC
Start: 2020-01-29 — End: 2020-01-29
  Administered 2020-01-29: 06:00:00 via INTRAVENOUS

## 2020-01-29 MED ORDER — ONDANSETRON (PF) 4 MG/2 ML INJECTION
4 mg/2 mL | INTRAMUSCULAR | Status: AC
Start: 2020-01-29 — End: 2020-01-28
  Administered 2020-01-29: 05:00:00 via INTRAVENOUS

## 2020-01-29 MED ORDER — SODIUM CHLORIDE 0.9% BOLUS IV
0.9 % | Freq: Once | INTRAVENOUS | Status: AC
Start: 2020-01-29 — End: 2020-01-29
  Administered 2020-01-29: 05:00:00 via INTRAVENOUS

## 2020-01-29 MED ORDER — HYDROMORPHONE (PF) 1 MG/ML IJ SOLN
1 mg/mL | Freq: Once | INTRAMUSCULAR | Status: AC
Start: 2020-01-29 — End: 2020-01-28
  Administered 2020-01-29: 05:00:00 via INTRAVENOUS

## 2020-01-29 MED ORDER — HYDROCODONE-ACETAMINOPHEN 5 MG-325 MG TAB
5-325 mg | ORAL_TABLET | Freq: Three times a day (TID) | ORAL | 0 refills | Status: AC | PRN
Start: 2020-01-29 — End: 2020-01-31

## 2020-01-29 MED FILL — HYDROMORPHONE (PF) 1 MG/ML IJ SOLN: 1 mg/mL | INTRAMUSCULAR | Qty: 1

## 2020-01-29 MED FILL — ONDANSETRON (PF) 4 MG/2 ML INJECTION: 4 mg/2 mL | INTRAMUSCULAR | Qty: 2

## 2020-01-29 MED FILL — DIPHENHYDRAMINE HCL 50 MG/ML IJ SOLN: 50 mg/mL | INTRAMUSCULAR | Qty: 1

## 2020-01-29 NOTE — ED Notes (Signed)
 I have reviewed discharge instructions with the patient.  The patient verbalized understanding.    Patient left ED via Discharge Method: stretcher to Home with husband via pov.    Opportunity for questions and clarification provided.       Patient given 1 scripts.         To continue your aftercare when you leave the hospital, you may receive an automated call from our care team to check in on how you are doing.  This is a free service and part of our promise to provide the best care and service to meet your aftercare needs." If you have questions, or wish to unsubscribe from this service please call [phone].  Thank you for Choosing our [facility] Emergency Department.

## 2020-01-29 NOTE — ED Notes (Signed)
I have reviewed discharge instructions with the patient.  The patient verbalized understanding.    Patient left ED via Discharge Method: stretcher to Home with husband via pov.    Opportunity for questions and clarification provided.       Patient given 1 scripts.         To continue your aftercare when you leave the hospital, you may receive an automated call from our care team to check in on how you are doing.  This is a free service and part of our promise to provide the best care and service to meet your aftercare needs.??? If you have questions, or wish to unsubscribe from this service please call [phone].  Thank you for Choosing our [place] [facility] Department.

## 2020-02-20 NOTE — Progress Notes (Signed)
Progress Notes by Dwana Curd, RN at 02/20/20 2201                Author: Dwana Curd, RN  Service: --  Author Type: Registered Nurse       Filed: 02/20/20 2215  Encounter Date: 02/20/2020  Status: Signed          Editor: Dwana Curd, RN (Registered Nurse)               New Patient Abstract      Reason for Referral: Sickle cell anemia      Referring Provider:  Luster Landsberg, MD      Primary Care Provider: Luster Landsberg, MD      Family History of Cancer/Hematologic Disorders: Family history is significant for brother who died from sickle cell crisis  at the age of 59 and sister with sickle cell trait.       Presenting Symptoms: Establish care for management of sickle cell anemia       Narrative with recent with Results/Procedures/Biopsies and Dates completed: [name] Bright is a 44 year old black female with  a history of migraines, DVT, acute anemia, iron overload, seizure disorder, vitamin D deficiency, depression, superior vena cava occlusion, infarction of spleen, thrombocythemia, GERD, cellulitis, thalassemia minor, AKI, stroke, osteoarthritis, cesarean  section, tubal ligation, exploratory laparotomy, and cholecystectomy. She also has a history of sickle cell disease, type SS. She presented to her PCP on 02/16/20 reporting frequent migraines over the past several days, as well as, daily epistaxis for  the past 2 weeks since moving back to [place] from [place]. She reported that her episodes of epistaxis often lasted up to an hour. Due to her history of occlusion of the left subclavian vein, right subclavian vein, and the superior vena cava,  as well as DVT, she is chronically anticoagulated with Xarelto. Patient was referred to ENT for further evaluation and etiology of epistaxis.       PCP notes that patients sickle cell disease is currently being treated with hydroxyurea 1610 mg and folic acid daily. She is also receiving monthly infusions of Adakveo which appear to have been  initiated in October of 2020. She typically travels  to the [facility] in [place], [place] for her Adakveo infusions, as she is established there with Hematologist, Dr. Les Pou. Patient reports that her most recent sickle cell crisis was approximately one month ago.       Review of patients records indicates that patient is rather transient, traveling from place to place. She has been known to live out of her car. She has seen many different doctors in many different states. Most recent labs drawn by her PCP on  02/16/20 were significant for RBC 2.39, HGB 9.4, HCT 26.0, MCV 108.8, MCH 39.3, MCHC 36.2, RDW 16.7, MPV 9.0, and PTT 25.1.       [name] now presents to [facility], per referral from primary care, for hematology evaluation and continued management of sickle cell anemia.                08/15/2017 18:58  08/16/2017 06:03  08/18/2017 05:32  08/12/2018 03:05  01/28/2020 23:41            WBC  8.1  8.1    5.1  8.2     RBC  2.50 (L)  2.08 (L)    1.46 (L)  2.63 (L)     HGB  10.4 (L)  8.4 (L)  8.1 (L)  7.0 (L)  10.7 (L)     HCT  29.6 (L)  24.4 (L)  23.3 (L)  19.7 (LL)  29.2 (L)     MCV  118.4 (H)  117.3 (H)    134.9 (H)  111.0 (H)     MCH  41.6 (H)  40.4 (H)    47.9 (H)  40.7 (H)     MCHC  35.1 (H)  34.4    35.5 (H)  36.6 (H)     RDW  15.9  16.9    16.6 (H)  16.1 (H)     PLATELET  567 (H)  448    325  411     MPV  9.2 (L)  9.4    10.9  8.9 (L)            NEUTROPHILS  50      50  60            LYMPHOCYTES  36      40  28     MONOCYTES  13 (H)      10  11     EOSINOPHILS  0 (L)      0 (L)  1     BASOPHILS  0      0  0     IMMATURE GRANULOCYTES  0      0  0     DF  AUTOMATED      AUTOMATED  AUTOMATED     ABSOLUTE NRBC  0.12  0.09    1.73 (H)  0.46 (H)     ABS. NEUTROPHILS  4.1      2.5  4.9     ABS. IMM. GRANS.  0.0      0.0  0.0     ABS. LYMPHOCYTES  2.9      2.0  2.3     ABS. MONOCYTES  1.0      0.5  0.9     ABS. EOSINOPHILS  0.0      0.0  0.1     ABS. BASOPHILS  0.0      0.0  0.0      Reticulocyte count  6.9 (H)  6.8 (H)    2.4 (H)  10.1 (H)     Absolute Retic Cnt.  0.1715 (H)  0.1421 (H)    0.0345  0.2667 (H)     Immature Retic Fraction  27.5 (H)  29.4 (H)    23.8 (H)  32.6 (H)            Retic Hgb Conc.  35  33    38 (H)  40 (H)                                                                   08/15/2017 18:58  08/16/2017 06:03  08/18/2017 05:32  08/12/2018 03:05  01/28/2020 23:41     Sodium  140  139  144  142  141     Potassium  3.5  4.0  3.7  3.6  4.1     Chloride  106  107  112 (H)  108 (H)  111 (H)     CO2  25  23  23  25  25     Anion gap  '9  9  9  9  5 ' (L)     Glucose  83  124 (H)  102 (H)  100  106 (H)     BUN  5 (L)  4 (L)  3 (L)  6  7     Creatinine  0.88  0.76  0.85  0.99  0.97     Calcium  9.3  8.7  8.5  8.9  9.3     Magnesium    2.1           GFR est non-AA  >60  >60  >60  >60  >60     GFR est AA  >60  >60  >60  >60  >60     Bilirubin, total  1.1  1.2 (H)    0.6  1.1     Protein, total  8.8 (H)  7.5    8.3 (H)  8.6 (H)     Albumin  4.0  3.5    3.9  4.2     Globulin  4.8 (H)  4.0 (H)    4.4 (H)  4.4 (H)     A-G Ratio  0.8 (L)  0.9 (L)    0.9 (L)  1.0 (L)     ALT  39  34    16  27     AST  43 (H)  41 (H)    12 (L)  35     Alk. phosphatase  134  113    117  108     LD    362 (H)           Troponin-I, Qt.  <0.02 (L)  <0.02 (L)                  Troponin-High Sensitivity          8.8        Notes from Referring Provider: None      Other Pertinent Information: None      Presented at Tumor Board: N/A

## 2020-02-20 NOTE — Progress Notes (Signed)
New Patient Abstract    Reason for Referral: Sickle cell anemia    Referring Provider:  Luster Landsberg, MD    Primary Care Provider: Luster Landsberg, MD    Family History of Cancer/Hematologic Disorders: Family history is significant for brother who died from sickle cell crisis at the age of 37 and sister with sickle cell trait.     Presenting Symptoms: Establish care for management of sickle cell anemia     Narrative with recent with Results/Procedures/Biopsies and Dates completed: [name] Bright is a 44 year old black female with a history of migraines, DVT, acute anemia, iron overload, seizure disorder, vitamin D deficiency, depression, superior vena cava occlusion, infarction of spleen, thrombocythemia, GERD, cellulitis, thalassemia minor, AKI, stroke, osteoarthritis, cesarean section, tubal ligation, exploratory laparotomy, and cholecystectomy. She also has a history of sickle cell disease, type SS. She presented to her PCP on 02/16/20 reporting frequent migraines over the past several days, as well as, daily epistaxis for the past 2 weeks since moving back to [place] from [place]. She reported that her episodes of epistaxis often lasted up to an hour. Due to her history of occlusion of the left subclavian vein, right subclavian vein, and the superior vena cava, as well as DVT, she is chronically anticoagulated with Xarelto. Patient was referred to ENT for further evaluation and etiology of epistaxis.     PCP notes that patient???s sickle cell disease is currently being treated with hydroxyurea 2376 mg and folic acid daily. She is also receiving monthly infusions of Adakveo which appear to have been initiated in October of 2020. She typically travels to the [facility] in [place], [place] for her Adakveo infusions, as she is established there with Hematologist, Dr. Les Pou. Patient reports that her most recent sickle cell crisis was approximately one month ago.     Review  of patient???s records indicates that patient is rather transient, traveling from place to place. She has been known to live out of her car. She has seen many different doctors in many different states. Most recent labs drawn by her PCP on 02/16/20 were significant for RBC 2.39, HGB 9.4, HCT 26.0, MCV 108.8, MCH 39.3, MCHC 36.2, RDW 16.7, MPV 9.0, and PTT 25.1.     [name] Bright now presents to [facility], per referral from primary care, for hematology evaluation and continued management of sickle cell anemia.      08/15/2017 18:58 08/16/2017 06:03 08/18/2017 05:32 08/12/2018 03:05 01/28/2020 23:41   WBC 8.1 8.1  5.1 8.2   RBC 2.50 (L) 2.08 (L)  1.46 (L) 2.63 (L)   HGB 10.4 (L) 8.4 (L) 8.1 (L) 7.0 (L) 10.7 (L)   HCT 29.6 (L) 24.4 (L) 23.3 (L) 19.7 (LL) 29.2 (L)   MCV 118.4 (H) 117.3 (H)  134.9 (H) 111.0 (H)   MCH 41.6 (H) 40.4 (H)  47.9 (H) 40.7 (H)   MCHC 35.1 (H) 34.4  35.5 (H) 36.6 (H)   RDW 15.9 16.9  16.6 (H) 16.1 (H)   PLATELET 567 (H) 448  325 411   MPV 9.2 (L) 9.4  10.9 8.9 (L)   NEUTROPHILS 50   50 60   LYMPHOCYTES 36   40 28   MONOCYTES 13 (H)   10 11   EOSINOPHILS 0 (L)   0 (L) 1   BASOPHILS 0   0 0   IMMATURE GRANULOCYTES 0   0 0   DF AUTOMATED   AUTOMATED AUTOMATED   ABSOLUTE NRBC 0.12  0.09  1.73 (H) 0.46 (H)   ABS. NEUTROPHILS 4.1   2.5 4.9   ABS. IMM. GRANS. 0.0   0.0 0.0   ABS. LYMPHOCYTES 2.9   2.0 2.3   ABS. MONOCYTES 1.0   0.5 0.9   ABS. EOSINOPHILS 0.0   0.0 0.1   ABS. BASOPHILS 0.0   0.0 0.0   Reticulocyte count 6.9 (H) 6.8 (H)  2.4 (H) 10.1 (H)   Absolute Retic Cnt. 0.1715 (H) 0.1421 (H)  0.0345 0.2667 (H)   Immature Retic Fraction 27.5 (H) 29.4 (H)  23.8 (H) 32.6 (H)   Retic Hgb Conc. 35 33  38 (H) 40 (H)                              08/15/2017 18:58 08/16/2017 06:03 08/18/2017 05:32 08/12/2018 03:05 01/28/2020 23:41   Sodium 140 139 144 142 141   Potassium 3.5 4.0 3.7 3.6 4.1   Chloride 106 107 112 (H) 108 (H) 111 (H)   CO2 25 23 23 25 25    Anion gap 9 9 9 9 5  (L)   Glucose 83 124 (H) 102 (H) 100 106 (H)   BUN 5 (L)  4 (L) 3 (L) 6 7   Creatinine 0.88 0.76 0.85 0.99 0.97   Calcium 9.3 8.7 8.5 8.9 9.3   Magnesium  2.1      GFR est non-AA >60 >60 >60 >60 >60   GFR est AA >60 >60 >60 >60 >60   Bilirubin, total 1.1 1.2 (H)  0.6 1.1   Protein, total 8.8 (H) 7.5  8.3 (H) 8.6 (H)   Albumin 4.0 3.5  3.9 4.2   Globulin 4.8 (H) 4.0 (H)  4.4 (H) 4.4 (H)   A-G Ratio 0.8 (L) 0.9 (L)  0.9 (L) 1.0 (L)   ALT 39 34  16 27   AST 43 (H) 41 (H)  12 (L) 35   Alk. phosphatase 134 113  117 108   LD  362 (H)      Troponin-I, Qt. <0.02 (L) <0.02 (L)      Troponin-High Sensitivity     8.8     Notes from Referring Provider: None    Other Pertinent Information: None    Presented at Tumor Board: N/A

## 2020-02-23 ENCOUNTER — Ambulatory Visit: Attending: Hematology | Primary: Student in an Organized Health Care Education/Training Program

## 2020-02-23 ENCOUNTER — Ambulatory Visit: Admit: 2020-02-23 | Discharge: 2020-02-23 | Payer: MEDICARE | Attending: Hematology | Primary: Family Medicine

## 2020-02-23 ENCOUNTER — Inpatient Hospital Stay: Admit: 2020-02-23 | Payer: MEDICARE | Primary: Family Medicine

## 2020-02-23 DIAGNOSIS — D571 Sickle-cell disease without crisis: Secondary | ICD-10-CM

## 2020-02-23 LAB — METABOLIC PANEL, COMPREHENSIVE
A-G Ratio: 1.1 — ABNORMAL LOW (ref 1.2–3.5)
ALT (SGPT): 23 U/L (ref 12–65)
AST (SGOT): 32 U/L (ref 15–37)
Albumin: 4.1 g/dL (ref 3.5–5.0)
Alk. phosphatase: 111 U/L (ref 50–136)
Anion gap: 8 mmol/L (ref 7–16)
BUN: 12 MG/DL (ref 6–23)
Bilirubin, total: 1.2 MG/DL — ABNORMAL HIGH (ref 0.2–1.1)
CO2: 23 mmol/L (ref 21–32)
Calcium: 8.9 MG/DL (ref 8.3–10.4)
Chloride: 110 mmol/L — ABNORMAL HIGH (ref 98–107)
Creatinine: 0.9 MG/DL (ref 0.6–1.0)
GFR est AA: >60 mL/min/{1.73_m2} (ref >60)
GFR est non-AA: >60 mL/min/{1.73_m2} (ref >60)
Globulin: 3.7 g/dL — ABNORMAL HIGH (ref 2.3–3.5)
Glucose: 79 mg/dL (ref 65–100)
Potassium: 3.7 mmol/L (ref 3.5–5.1)
Protein, total: 7.8 g/dL (ref 6.3–8.2)
Sodium: 141 mmol/L (ref 136–145)

## 2020-02-23 LAB — HEPATITIS PANEL, ACUTE
HCV Ab: NONREACTIVE
Hep A IgM: NONREACTIVE
Hep B Core Ab, IgM: NONREACTIVE
Hep B Surface Ag: NONREACTIVE
Hep Bs Ag: NONREACTIVE
Hepatitis A, IgM: NONREACTIVE
Hepatitis B core, IgM: NONREACTIVE
Hepatitis C virus Ab: NONREACTIVE

## 2020-02-23 LAB — VITAMIN B12
Vitamin B-12: 356 pg/mL (ref 193–986)
Vitamin B12: 356 pg/mL (ref 193–986)

## 2020-02-23 LAB — FOLATE
Folate: 16.7 ng/mL (ref 3.1–17.5)
Folate: 16.7 ng/mL (ref 3.1–17.5)

## 2020-02-23 LAB — LD: LD: 349 U/L — ABNORMAL HIGH (ref 100–190)

## 2020-02-23 LAB — VITAMIN D, 25 HYDROXY: Vitamin D 25-Hydroxy: 8.9 ng/mL — ABNORMAL LOW (ref 30.0–100.0)

## 2020-02-23 LAB — FERRITIN
Ferritin: 983 NG/ML — ABNORMAL HIGH (ref 8–388)
Ferritin: 983 NG/ML — ABNORMAL HIGH (ref 8–388)

## 2020-02-23 LAB — COMPREHENSIVE METABOLIC PANEL
ALT: 23 U/L (ref 12–65)
AST: 32 U/L (ref 15–37)
Albumin/Globulin Ratio: 1.1 — ABNORMAL LOW (ref 1.2–3.5)
Albumin: 4.1 g/dL (ref 3.5–5.0)
Alkaline Phosphatase: 111 U/L (ref 50–136)
Anion Gap: 8 mmol/L (ref 7–16)
BUN: 12 MG/DL (ref 6–23)
CO2: 23 mmol/L (ref 21–32)
Calcium: 8.9 MG/DL (ref 8.3–10.4)
Chloride: 110 mmol/L — ABNORMAL HIGH (ref 98–107)
Creatinine: 0.9 MG/DL (ref 0.6–1.0)
EGFR IF NonAfrican American: >60 mL/min/{1.73_m2} (ref >60)
GFR African American: >60 mL/min/{1.73_m2} (ref >60)
Globulin: 3.7 g/dL — ABNORMAL HIGH (ref 2.3–3.5)
Glucose: 79 mg/dL (ref 65–100)
Potassium: 3.7 mmol/L (ref 3.5–5.1)
Sodium: 141 mmol/L (ref 136–145)
Total Bilirubin: 1.2 MG/DL — ABNORMAL HIGH (ref 0.2–1.1)
Total Protein: 7.8 g/dL (ref 6.3–8.2)

## 2020-02-23 LAB — LACTATE DEHYDROGENASE: LD: 349 U/L — ABNORMAL HIGH (ref 100–190)

## 2020-02-23 LAB — VITAMIN D 25 HYDROXY: Vit D, 25-Hydroxy: 8.9 ng/mL — ABNORMAL LOW (ref 30.0–100.0)

## 2020-02-23 NOTE — Progress Notes (Signed)
[address] [facility]  [address]  [place], [place] [address]  Phone: [phone]        02/23/2020  Aalaya Yadao  02/24/1976  [id number]       [name] Bright is a 44 year old African-American female who has been sent to my clinic by Dr. Delfino Lovett for management of Sickle Cell Disease; she is on Hydrea 1000 mg/day and Crizanlizumab 5 mg/kg every 4 weeks.      ALLERGIES:    Latex.      FAMILY HISTORY:    Her brother died of Sickle Cell Crisis.      SOCIAL HISTORY:    She is single and lives alone. She is on SSI. She denies ever using any tobacco products.      PAST MEDICAL HISTORY:     Migraines, DVT, Seizures, Vitamin D deficiency, Depression, splenic infarction, GERD, renal insufficiency, Osteoarthritis, SVC thrombosis and Sickle Cell Disease.      ROS:  The patient complained of fatigue and exertional dyspnea; all other systems negative.      PHYSICAL EXAM:   The patient was alert, awake and oriented, no acute distress was noted. Oral examination did not reveal any mucosal lesions. Lymph node examination did not reveal any adenopathy. Neck examination revealed a supple neck, no thyromegaly or masses were noted. Chest examination revealed normal vesicular breath sounds. Heart examination revealed S-1 and S-2 without any murmurs. Abdominal examination revealed a non-tender abdomen, bowel sounds were positive, no organomegaly could be appreciated. Examination of the extremities did not reveal any tenderness or erythema. Examination of the skin did not reveal any lesions.  Medical problems and test results were reviewed with the patient today.      KPS:    80.      LABORATORY INVESTIGATIONS:  CBC showed a WBC count of 8.2, ANC was 4.9, Hemoglobin was 10.7 and Platelets were 411.      ASSESSMENT:    Sickle Cell Disease without crisis; Vitamin D deficiency. I spent a total of 62 minutes on the day of the visit, managing the care of this patient.      PLAN:    She should stop taking Xarelto and start  taking ASA 81 mg/day, she should continue taking Hydrea 1000 mg/day, Folic acid 1 mg/day and supplemental Vitamin D; her Thalassemia work-up and partial Hypercoagulable work-up are pending. I will arrange for her to receive Crizanlizumab 5mg /kg every 4 weeks.      Thank you for allowing me to participate in the care of this patient.      MD  Hematology/BMT

## 2020-02-23 NOTE — Patient Instructions (Addendum)
Patient Instructions from Today's Visit    Reason for Visit:  New Patient    Diagnosis Information:  https://www.cancer.net/about-us/asco-answers-patient-education-materials/asco-answers-fact-sheets    Plan:  You will have blood work done today  We recommend taking an 81 mg Aspirin daily instead of the Xarelto  Continue taking your Hydrea and Folic Acid as directed  We will arrange for you to receive your Adakveo infusions at our office.    Follow Up:  We will bring you back in April for a follow up with Dr.Khan and lab work prior.    Recent Lab Results:  Lab work done today downstairs    Treatment Summary has been discussed and given to patient: n/a        -------------------------------------------------------------------------------------------------------------------  Please call our office at [phone] if you have any  of the following symptoms:   ?? Fever of 100.5 or greater  ?? Chills  ?? Shortness of breath  ?? Swelling or pain in one leg    After office hours an answering service is available and will contact a provider for emergencies or if you are experiencing any of the above symptoms.    ??? Patient did not express an interest in My Chart.  My Chart log in information explained on the after visit summary printout at the check-out desk.    Aundra Millet D Lenord Fralix

## 2020-02-23 NOTE — Progress Notes (Signed)
[facility]  [address] [place] [address]  Phone: [phone]        02/23/2020  [name] Bright  12/22/1975  [id number]       [name] Bright is a 43 year old African-American female who has been sent to my clinic by Dr. Dean Trantham for management of Sickle Cell Disease; she is on Hydrea 1000 mg/day and Crizanlizumab 5 mg/kg every 4 weeks.      ALLERGIES:    Latex.      FAMILY HISTORY:    Her brother died of Sickle Cell Crisis.      SOCIAL HISTORY:    She is single and lives alone. She is on SSI. She denies ever using any tobacco products.      PAST MEDICAL HISTORY:     Migraines, DVT, Seizures, Vitamin D deficiency, Depression, splenic infarction, GERD, renal insufficiency, Osteoarthritis, SVC thrombosis and Sickle Cell Disease.      ROS:  The patient complained of fatigue and exertional dyspnea; all other systems negative.      PHYSICAL EXAM:   The patient was alert, awake and oriented, no acute distress was noted. Oral examination did not reveal any mucosal lesions. Lymph node examination did not reveal any adenopathy. Neck examination revealed a supple neck, no thyromegaly or masses were noted. Chest examination revealed normal vesicular breath sounds. Heart examination revealed S-1 and S-2 without any murmurs. Abdominal examination revealed a non-tender abdomen, bowel sounds were positive, no organomegaly could be appreciated. Examination of the extremities did not reveal any tenderness or erythema. Examination of the skin did not reveal any lesions.  Medical problems and test results were reviewed with the patient today.      KPS:    80.      LABORATORY INVESTIGATIONS:  CBC showed a WBC count of 8.2, ANC was 4.9, Hemoglobin was 10.7 and Platelets were 411.      ASSESSMENT:    Sickle Cell Disease without crisis; Vitamin D deficiency. I spent a total of 62 minutes on the day of the visit, managing the care of this patient.      PLAN:    She should stop taking Xarelto and start  taking ASA 81 mg/day, she should continue taking Hydrea 1000 mg/day, Folic acid 1 mg/day and supplemental Vitamin D; her Thalassemia work-up and partial Hypercoagulable work-up are pending. I will arrange for her to receive Crizanlizumab 5mg/kg every 4 weeks.      Thank you for allowing me to participate in the care of this patient.      Bevan Vu MD  Hematology/BMT

## 2020-02-24 ENCOUNTER — Encounter: Primary: Family Medicine

## 2020-02-24 LAB — HIV 1/2 AG/AB, 4TH GENERATION,W RFLX CONFIRM: HIV 1/2 Interpretation: NONREACTIVE

## 2020-02-24 LAB — HIV 1/2 ANTIGEN/ANTIBODY, FOURTH GENERATION W/RFL: Interpretation: NONREACTIVE

## 2020-02-25 LAB — PROTEIN ELEC WITH IFE, SERUM
A-G Ratio: 1.2
ALBUMIN: 4.24 g/dL (ref 3.20–5.60)
ALPHA 2: 0.55 g/dL (ref 0.40–1.20)
Albumin/Globulin Ratio: 1.2
Albumin: 4.24 g/dL (ref 3.20–5.60)
Alpha 1 Globulin: 0.22 g/dL (ref 0.10–0.40)
Alpha 2 Globulin: 0.55 g/dL (ref 0.40–1.20)
Alpha-1 globulin: 0.22 g/dL (ref 0.10–0.40)
Beta Globulin: 1.13 g/dL (ref 0.60–1.30)
Beta-globulin: 1.13 g/dL (ref 0.60–1.30)
Gamma Globulin: 1.66 g/dL — ABNORMAL HIGH (ref 0.50–1.60)
Gamma-globulin: 1.66 g/dL — ABNORMAL HIGH (ref 0.50–1.60)
IgG: 1561 mg/dL (ref 610–1616)
IgM: 108 mg/dL (ref 35–242)
Immunoglobulin A,Serum: 438 mg/dL (ref 85–499)
Immunoglobulin A: 438 mg/dL (ref 85–499)
Immunoglobulin G: 1561 mg/dL (ref 610–1616)
Immunoglobulin M: 108 mg/dL (ref 35–242)
Protein, total: 7.8 g/dL (ref 6.3–8.2)
Total Protein: 7.8 g/dL (ref 6.3–8.2)

## 2020-02-25 LAB — CARDIOLIPIN AB PANEL
CARDIOLIPIN AB,IGG,QN, 161811: <9 GPL U/mL (ref 0–14)
CARDIOLIPIN AB,IGM,QN, 161829: <9 MPL U/mL (ref 0–12)
Cardiolipin Ab, IgA: <9 APL U/mL (ref 0–11)
Cardiolipin Ab, IgA: <9 APL U/mL (ref 0–11)
Cardiolipin Ab, IgG: <9 GPL U/mL (ref 0–14)
Cardiolipin Ab, IgM: <9 MPL U/mL (ref 0–12)

## 2020-02-25 NOTE — Telephone Encounter (Signed)
Left message for patient letting her know lab work ordered by Dr.Khan needs to be completed before appt and treatment next week. Asked patient to call office to set up an appt for lab work.

## 2020-02-27 ENCOUNTER — Inpatient Hospital Stay: Admit: 2020-02-27 | Payer: MEDICARE | Primary: Family Medicine

## 2020-02-27 ENCOUNTER — Encounter

## 2020-02-27 DIAGNOSIS — D571 Sickle-cell disease without crisis: Secondary | ICD-10-CM

## 2020-02-27 LAB — CBC WITH AUTOMATED DIFF
ABS. BASOPHILS: 0 10*3/uL (ref 0.0–0.2)
ABS. EOSINOPHILS: 0.1 10*3/uL (ref 0.0–0.8)
ABS. IMM. GRANS.: 0 10*3/uL (ref 0.0–0.5)
ABS. LYMPHOCYTES: 2 10*3/uL (ref 0.5–4.6)
ABS. MONOCYTES: 0.7 10*3/uL (ref 0.1–1.3)
ABS. NEUTROPHILS: 4.1 10*3/uL (ref 1.7–8.2)
ABSOLUTE NRBC: 0.49 10*3/uL — ABNORMAL HIGH (ref 0.0–0.2)
BASOPHILS: 0 % (ref 0.0–2.0)
EOSINOPHILS: 1 % (ref 0.5–7.8)
HCT: 26.7 % — ABNORMAL LOW (ref 35.8–46.3)
HGB: 9.4 g/dL — ABNORMAL LOW (ref 11.7–15.4)
IMMATURE GRANULOCYTES: 0 % (ref 0.0–5.0)
LYMPHOCYTES: 29 % (ref 13–44)
MCH: 39.8 PG — ABNORMAL HIGH (ref 26.1–32.9)
MCHC: 35.2 g/dL — ABNORMAL HIGH (ref 31.4–35.0)
MCV: 113.1 FL — ABNORMAL HIGH (ref 79.6–97.8)
MONOCYTES: 10 % (ref 4.0–12.0)
MPV: 8.9 FL — ABNORMAL LOW (ref 9.4–12.3)
NEUTROPHILS: 59 % (ref 43–78)
PLATELET: 418 10*3/uL (ref 150–450)
RBC: 2.36 M/uL — ABNORMAL LOW (ref 4.05–5.2)
RDW: 17 % — ABNORMAL HIGH (ref 11.9–14.6)
WBC: 6.9 10*3/uL (ref 4.3–11.1)

## 2020-02-27 LAB — METABOLIC PANEL, COMPREHENSIVE
A-G Ratio: 1 — ABNORMAL LOW (ref 1.2–3.5)
ALT (SGPT): 47 U/L (ref 12–65)
AST (SGOT): 37 U/L (ref 15–37)
Albumin: 4 g/dL (ref 3.5–5.0)
Alk. phosphatase: 124 U/L (ref 50–136)
Anion gap: 9 mmol/L (ref 7–16)
BUN: 7 MG/DL (ref 6–23)
Bilirubin, total: 1.6 MG/DL — ABNORMAL HIGH (ref 0.2–1.1)
CO2: 22 mmol/L (ref 21–32)
Calcium: 8.7 MG/DL (ref 8.3–10.4)
Chloride: 107 mmol/L (ref 98–107)
Creatinine: 0.8 MG/DL (ref 0.6–1.0)
GFR est AA: >60 mL/min/{1.73_m2} (ref >60)
GFR est non-AA: >60 mL/min/{1.73_m2} (ref >60)
Globulin: 4.2 g/dL — ABNORMAL HIGH (ref 2.3–3.5)
Glucose: 94 mg/dL (ref 65–100)
Potassium: 3.4 mmol/L — ABNORMAL LOW (ref 3.5–5.1)
Protein, total: 8.2 g/dL (ref 6.3–8.2)
Sodium: 138 mmol/L (ref 136–145)

## 2020-02-27 LAB — CBC WITH AUTO DIFFERENTIAL
Basophils %: 0 % (ref 0.0–2.0)
Basophils Absolute: 0 10*3/uL (ref 0.0–0.2)
Eosinophils %: 1 % (ref 0.5–7.8)
Eosinophils Absolute: 0.1 10*3/uL (ref 0.0–0.8)
Granulocyte Absolute Count: 0 10*3/uL (ref 0.0–0.5)
Hematocrit: 26.7 % — ABNORMAL LOW (ref 35.8–46.3)
Hemoglobin: 9.4 g/dL — ABNORMAL LOW (ref 11.7–15.4)
Immature Granulocytes: 0 % (ref 0.0–5.0)
Lymphocytes %: 29 % (ref 13–44)
Lymphocytes Absolute: 2 10*3/uL (ref 0.5–4.6)
MCH: 39.8 PG — ABNORMAL HIGH (ref 26.1–32.9)
MCHC: 35.2 g/dL — ABNORMAL HIGH (ref 31.4–35.0)
MCV: 113.1 FL — ABNORMAL HIGH (ref 79.6–97.8)
MPV: 8.9 FL — ABNORMAL LOW (ref 9.4–12.3)
Monocytes %: 10 % (ref 4.0–12.0)
Monocytes Absolute: 0.7 10*3/uL (ref 0.1–1.3)
NRBC Absolute: 0.49 10*3/uL — ABNORMAL HIGH (ref 0.0–0.2)
Neutrophils %: 59 % (ref 43–78)
Neutrophils Absolute: 4.1 10*3/uL (ref 1.7–8.2)
Platelets: 418 10*3/uL (ref 150–450)
RBC: 2.36 M/uL — ABNORMAL LOW (ref 4.05–5.2)
RDW: 17 % — ABNORMAL HIGH (ref 11.9–14.6)
WBC: 6.9 10*3/uL (ref 4.3–11.1)

## 2020-02-27 LAB — COMPREHENSIVE METABOLIC PANEL
ALT: 47 U/L (ref 12–65)
AST: 37 U/L (ref 15–37)
Albumin/Globulin Ratio: 1 — ABNORMAL LOW (ref 1.2–3.5)
Albumin: 4 g/dL (ref 3.5–5.0)
Alkaline Phosphatase: 124 U/L (ref 50–136)
Anion Gap: 9 mmol/L (ref 7–16)
BUN: 7 MG/DL (ref 6–23)
CO2: 22 mmol/L (ref 21–32)
Calcium: 8.7 MG/DL (ref 8.3–10.4)
Chloride: 107 mmol/L (ref 98–107)
Creatinine: 0.8 MG/DL (ref 0.6–1.0)
EGFR IF NonAfrican American: >60 mL/min/{1.73_m2} (ref >60)
GFR African American: >60 mL/min/{1.73_m2} (ref >60)
Globulin: 4.2 g/dL — ABNORMAL HIGH (ref 2.3–3.5)
Glucose: 94 mg/dL (ref 65–100)
Potassium: 3.4 mmol/L — ABNORMAL LOW (ref 3.5–5.1)
Sodium: 138 mmol/L (ref 136–145)
Total Bilirubin: 1.6 MG/DL — ABNORMAL HIGH (ref 0.2–1.1)
Total Protein: 8.2 g/dL (ref 6.3–8.2)

## 2020-02-28 LAB — HOMOCYSTEINE, PLASMA
HOMOCYSTEINE, PLASMA: 11.5 umol/L (ref 0.0–14.5)
Homocysteine, plasma: 11.5 umol/L (ref 0.0–14.5)

## 2020-03-02 LAB — FACTOR II  DNA ANALYSIS

## 2020-03-03 ENCOUNTER — Encounter: Attending: Hematology & Oncology | Primary: Family Medicine

## 2020-03-03 LAB — HEMOGLOBIN FRACTIONATION, REFLEX
HEMOGLOBIN E: 0 %
Hemoglobin A1: 0 % — ABNORMAL LOW (ref 96.4–98.8)
Hemoglobin C: 0 %
Hemoglobin F: 16.4 % — ABNORMAL HIGH (ref 0.0–2.0)
Hemoglobin S: 81.2 % — ABNORMAL HIGH
Hgb Variant: 0 %
Hgb solubility: POSITIVE — AB

## 2020-03-03 LAB — HEMOGLOBIN FRACTIONATION
HEMOGLOBIN A2: 2.4 % (ref 1.8–3.2)
Hemoglobin A2: 2.4 % (ref 1.8–3.2)

## 2020-03-03 LAB — FACTOR V LEIDEN

## 2020-03-04 ENCOUNTER — Ambulatory Visit: Attending: Hematology & Oncology | Primary: Student in an Organized Health Care Education/Training Program

## 2020-03-04 ENCOUNTER — Inpatient Hospital Stay: Payer: MEDICARE | Primary: Family Medicine

## 2020-03-04 ENCOUNTER — Ambulatory Visit
Admit: 2020-03-04 | Discharge: 2020-03-04 | Payer: MEDICARE | Attending: Hematology & Oncology | Primary: Family Medicine

## 2020-03-04 ENCOUNTER — Encounter: Primary: Family Medicine

## 2020-03-04 DIAGNOSIS — D571 Sickle-cell disease without crisis: Secondary | ICD-10-CM

## 2020-03-04 NOTE — Progress Notes (Signed)
[facility]  [address]  [place], [place] [address]  Phone: [phone]        03/04/2020  [name] Bright  10-16-1976  [id number]       [name] Bright is a 44 year old African-American female who has been sent to my clinic by Dr. Delfino Lovett for management of Sickle Cell Disease; she is on Hydrea 1000 mg/day and Crizanlizumab 5 mg/kg every 4 weeks.      ALLERGIES:    Latex.      FAMILY HISTORY:    Her brother died of Sickle Cell Crisis.      SOCIAL HISTORY:    She is single and lives alone. She is on SSI. She denies ever using any tobacco products.      PAST MEDICAL HISTORY:     Migraines, DVT, Seizures, Vitamin D deficiency, Depression, splenic infarction, GERD, renal insufficiency, Osteoarthritis, SVC thrombosis and Sickle Cell Disease.      ROS:  The patient complained of fatigue and exertional dyspnea; all other systems negative.      PHYSICAL EXAM:   The patient was alert, awake and oriented, no acute distress was noted. Oral examination did not reveal any mucosal lesions. Lymph node examination did not reveal any adenopathy. Neck examination revealed a supple neck, no thyromegaly or masses were noted. Chest examination revealed normal vesicular breath sounds. Heart examination revealed S-1 and S-2 without any murmurs. Abdominal examination revealed a non-tender abdomen, bowel sounds were positive, no organomegaly could be appreciated. Examination of the extremities did not reveal any tenderness or erythema. Examination of the skin did not reveal any lesions.  Medical problems and test results were reviewed with the patient today.      KPS:    80.      LABORATORY INVESTIGATIONS:  CBC showed a WBC count of 8.2, ANC was 4.9, Hemoglobin was 10.7 and Platelets were 411.      ASSESSMENT:    Sickle Cell Disease without crisis; Vitamin D deficiency, epistaxis      PLAN:    She stopped taking Xarelto and start taking ASA 81 mg/day and saw ENT for catheterization however still have epistaxis  therefore stopped aspirin, instructed to return to ENT for further evaluation and management, continue taking Hydrea 1000 mg/day, Folic acid 1 mg/day and supplemental Vitamin D; insurance had denied Crizanlizumab because of her relocation, consulted financial counselor and tentatively to resume Crizanlizumab 5mg /kg tomorrow and repeat every 4 weeks.      Thank you for allowing me to participate in the care of this patient.      MD  Hematology/BMT

## 2020-03-04 NOTE — Progress Notes (Signed)
I spoke with [name] Bright regarding her current insurance coverage. The patient currently has [facility] Medicaid for [place] (out of network). [name] completed a [facility] Medicaid Application and the FAP. I will send the completed application to the [employer] for review. [name] Bright signed the LPOA.The FAP will be scanned into the system. [name] states she will e-mail the last three months debit card statements (card for Social Security Benefits to be received) to me for the FAP and [facility] Medicaid Application. The patient receives SSI which is $704.00 per month with no assets. [name] does not own a car or a home in her name. [name] does not have a checking or savings account.     We performed a conference call with the [facility] Advantage Plans for a transfer of insurance benefits to [place]. [name] was enrolled into the [employer] PPO Medicare Advantage Plan. The escalations team, supervisor, and manager of the [facility] Medicare Advantage Plan have begun an emergency order to see if this plan can be effective 03/04/2020 (today) instead of 04/03/2020. This process will take 24 to 48 hours to complete if successful. [employer] will contact the patient directly because the insurance company will not contact the provider regarding these changes. I advised [name] to call me when she receives a denial or confirmation from [facility] regarding the new [facility] Medicare Advantage Plan and the effective date of the plan.     The confirmation number for the conference call is [id number]. The phone number of the conference call to [facility] is [phone]. I will follow-up with the patient tomorrow and Monday to see if the [facility] Premier PPO Medicare Advantage Plan is effective.    [name] states her best contact phone number is [phone]. If [name] is approved for [facility] Medicaid, the effective date can be retroactive up to  90 days if deemed necessary by [facility] Medicaid.

## 2020-03-04 NOTE — Progress Notes (Signed)
I spoke with [name] Bright regarding her current insurance coverage. The patient currently has [employer] Medicaid for [name] Bright (out of network). [name] Bright completed a [place] Medicaid Application and the FAP. I will send the completed application to the Central Processing Office for review. [name] Bright signed the LPOA.The FAP will be scanned into the system. [name] Bright states she will e-mail the last three months debit card statements (card for Social Security Benefits to be received) to me for the FAP and [place] Medicaid Application. The patient receives SSI which is $704.00 per month with no assets. [name] Bright does not own a car or a home in her name. [name] Bright does not have a checking or savings account.     We performed a conference call with the [employer] Advantage Plans for a transfer of insurance benefits to [name] Bright. [name] Bright was enrolled into the [employer] Premier PPO Medicare Advantage Plan. The escalations team, supervisor, and manager of the [employer] Medicare Advantage Plan have begun an emergency order to see if this plan can be effective 03/04/2020 (today) instead of 04/03/2020. This process will take 24 to 48 hours to complete if successful. [employer] will contact the patient directly because the insurance company will not contact the provider regarding these changes. I advised [name] Bright to call me when she receives a denial or confirmation from [employer] regarding the new [place] [employer] Medicare Advantage Plan and the effective date of the plan.     The confirmation number for the conference call is [id number]. The phone number of the conference call to [employer] is [phone]. I will follow-up with the patient tomorrow and Monday to see if the [employer] Premier PPO Medicare Advantage Plan is effective.    [name] Bright states her best contact phone number is [phone]. If [name] Bright is approved for [place] Medicaid, the effective date can be retroactive up to  90 days if deemed necessary by [place] Medicaid.

## 2020-03-04 NOTE — Patient Instructions (Addendum)
Patient Instructions from Today's Visit    Reason for Visit:  Follow-up visit       Plan:  -Continue to hold aspirin while you are bleeding.   -Proceed with treatment when approved.     Follow Up:  -As scheduled    Recent Lab Results:  Results for TIFFAY, PINETTE (MRN [id number]) as of 03/04/2020 14:20   Ref. Range 02/27/2020 13:20   WBC Latest Ref Range: 4.3 - 11.1 K/uL 6.9   RBC Latest Ref Range: 4.05 - 5.2 M/uL 2.36 (L)   HGB Latest Ref Range: 11.7 - 15.4 g/dL 9.4 (L)   HCT Latest Ref Range: 35.8 - 46.3 % 26.7 (L)   MCV Latest Ref Range: 79.6 - 97.8 FL 113.1 (H)   MCH Latest Ref Range: 26.1 - 32.9 PG 39.8 (H)   MCHC Latest Ref Range: 31.4 - 35.0 g/dL 35.2 (H)   RDW Latest Ref Range: 11.9 - 14.6 % 17.0 (H)   PLATELET Latest Ref Range: 150 - 450 K/uL 418   MPV Latest Ref Range: 9.4 - 12.3 FL 8.9 (L)   NEUTROPHILS Latest Ref Range: 43 - 78 % 59   LYMPHOCYTES Latest Ref Range: 13 - 44 % 29   MONOCYTES Latest Ref Range: 4.0 - 12.0 % 10   EOSINOPHILS Latest Ref Range: 0.5 - 7.8 % 1   BASOPHILS Latest Ref Range: 0.0 - 2.0 % 0   IMMATURE GRANULOCYTES Latest Ref Range: 0.0 - 5.0 % 0   DF Latest Units:   AUTOMATED   ABSOLUTE NRBC Latest Ref Range: 0.0 - 0.2 K/uL 0.49 (H)   ABS. NEUTROPHILS Latest Ref Range: 1.7 - 8.2 K/UL 4.1   ABS. IMM. GRANS. Latest Ref Range: 0.0 - 0.5 K/UL 0.0   ABS. LYMPHOCYTES Latest Ref Range: 0.5 - 4.6 K/UL 2.0   ABS. MONOCYTES Latest Ref Range: 0.1 - 1.3 K/UL 0.7   ABS. EOSINOPHILS Latest Ref Range: 0.0 - 0.8 K/UL 0.1   ABS. BASOPHILS Latest Ref Range: 0.0 - 0.2 K/UL 0.0   Sodium Latest Ref Range: 136 - 145 mmol/L 138   Potassium Latest Ref Range: 3.5 - 5.1 mmol/L 3.4 (L)   Chloride Latest Ref Range: 98 - 107 mmol/L 107   CO2 Latest Ref Range: 21 - 32 mmol/L 22   Anion gap Latest Ref Range: 7 - 16 mmol/L 9   Glucose Latest Ref Range: 65 - 100 mg/dL 94   BUN Latest Ref Range: 6 - 23 MG/DL 7   Creatinine Latest Ref Range: 0.6 - 1.0 MG/DL 0.80   Calcium Latest Ref Range: 8.3 - 10.4 MG/DL 8.7   GFR  est non-AA Latest Ref Range: >60 ml/min/1.78m >60   GFR est AA Latest Ref Range: >60 ml/min/1.78m>60   Bilirubin, total Latest Ref Range: 0.2 - 1.1 MG/DL 1.6 (H)   Protein, total Latest Ref Range: 6.3 - 8.2 g/dL 8.2   Albumin Latest Ref Range: 3.5 - 5.0 g/dL 4.0   Globulin Latest Ref Range: 2.3 - 3.5 g/dL 4.2 (H)   A-G Ratio Latest Ref Range: 1.2 - 3.5   1.0 (L)   ALT Latest Ref Range: 12 - 65 U/L 47   AST Latest Ref Range: 15 - 37 U/L 37   Alk. phosphatase Latest Ref Range: 50 - 136 U/L 124   Homocysteine, plasma Latest Ref Range: 0.0 - 14.5 umol/L 11.5   FACTOR II  DNA ANALYSIS Unknown Rpt   FACTOR V LEIDEN Unknown Rpt   HEMOGLOBIN E  Latest Ref Range: 0.0 % 0.0   HEMOGLOBIN FRACTIONATION Unknown Rpt   HEMOGLOBIN FRACTIONATION, REFLEX Unknown Rpt (A)       Treatment Summary has been discussed and given to patient: No        -------------------------------------------------------------------------------------------------------------------  Please call our office at [phone] if you have any  of the following symptoms:   ?? Fever of 100.5 or greater  ?? Chills  ?? Shortness of breath  ?? Swelling or pain in one leg    After office hours an answering service is available and will contact a provider for emergencies or if you are experiencing any of the above symptoms.    ??? Patient does express an interest in My Chart.  My Chart log in information explained on the after visit summary printout at the [place] desk.    Vinson Moselle, RN

## 2020-03-04 NOTE — Progress Notes (Signed)
[facility]  [address] [place] [address]  Phone: [phone]        03/04/2020  [name] Bright  12/18/1975  [id number]       [name] Bright is a 43 year old African-American female who has been sent to my clinic by Dr. Dean Trantham for management of Sickle Cell Disease; she is on Hydrea 1000 mg/day and Crizanlizumab 5 mg/kg every 4 weeks.      ALLERGIES:    Latex.      FAMILY HISTORY:    Her brother died of Sickle Cell Crisis.      SOCIAL HISTORY:    She is single and lives alone. She is on SSI. She denies ever using any tobacco products.      PAST MEDICAL HISTORY:     Migraines, DVT, Seizures, Vitamin D deficiency, Depression, splenic infarction, GERD, renal insufficiency, Osteoarthritis, SVC thrombosis and Sickle Cell Disease.      ROS:  The patient complained of fatigue and exertional dyspnea; all other systems negative.      PHYSICAL EXAM:   The patient was alert, awake and oriented, no acute distress was noted. Oral examination did not reveal any mucosal lesions. Lymph node examination did not reveal any adenopathy. Neck examination revealed a supple neck, no thyromegaly or masses were noted. Chest examination revealed normal vesicular breath sounds. Heart examination revealed S-1 and S-2 without any murmurs. Abdominal examination revealed a non-tender abdomen, bowel sounds were positive, no organomegaly could be appreciated. Examination of the extremities did not reveal any tenderness or erythema. Examination of the skin did not reveal any lesions.  Medical problems and test results were reviewed with the patient today.      KPS:    80.      LABORATORY INVESTIGATIONS:  CBC showed a WBC count of 8.2, ANC was 4.9, Hemoglobin was 10.7 and Platelets were 411.      ASSESSMENT:    Sickle Cell Disease without crisis; Vitamin D deficiency, epistaxis      PLAN:    She stopped taking Xarelto and start taking ASA 81 mg/day and saw ENT for catheterization however still have epistaxis  therefore stopped aspirin, instructed to return to ENT for further evaluation and management, continue taking Hydrea 1000 mg/day, Folic acid 1 mg/day and supplemental Vitamin D; insurance had denied Crizanlizumab because of her relocation, consulted financial counselor and tentatively to resume Crizanlizumab 5mg/kg tomorrow and repeat every 4 weeks.      Thank you for allowing me to participate in the care of this patient.      Sharif Khan MD  Hematology/BMT

## 2020-03-05 LAB — MTHFR MUTATION DETECTION

## 2020-03-05 NOTE — Progress Notes (Signed)
I called Andrey Campanile on several occassions today regarding follow-up to [employer] PPO advantage Plan. No answer and I cannot leave a message on her voicemail.

## 2020-03-05 NOTE — Progress Notes (Signed)
I called [name] Bright on several occassions today regarding follow-up to [employer] PPO advantage Plan. No answer and I cannot leave a message on her voicemail.

## 2020-03-08 NOTE — Progress Notes (Signed)
I called [name] Bright and I cannot leave a message on her phone. I called provider services for [facility]. [name] Bright has been enrolled into the [employer] PPO Medicare Advantage Plan effective date 04/04/2019 for the [place]. I am waiting to see if this plan will be retroactive to 03/04/2020. I was provided with a copy of the Plan ID, BIN, PCN, and Group Numbers.

## 2020-03-08 NOTE — Progress Notes (Signed)
I called Jazma Boutelle and I cannot leave a message on her phone. I called provider services for [employer]. Saxon Soman has been enrolled into the [employer] PPO Medicare Advantage Plan effective date 04/04/2019 for the [place]. I am waiting to see if this plan will be retroactive to 03/04/2020. I was provided with a copy of the Plan ID, BIN, PCN, and Group Numbers.

## 2020-03-09 NOTE — Progress Notes (Signed)
I spoke with [name] Bright and the patient stated she is moving back to the state of [place] by the end of March 13, 2020.

## 2020-03-09 NOTE — Progress Notes (Signed)
I spoke with [name] Bright and the patient stated she is moving back to the state of [place] by the end of March 13, 2020.

## 2020-03-11 ENCOUNTER — Inpatient Hospital Stay: Primary: Family Medicine

## 2020-03-12 LAB — ALPHA THALASSEMIA

## 2020-04-02 ENCOUNTER — Encounter: Attending: Hematology | Primary: Family Medicine

## 2020-04-02 ENCOUNTER — Encounter: Primary: Family Medicine

## 2020-04-02 ENCOUNTER — Ambulatory Visit: Primary: Family Medicine

## 2020-04-05 ENCOUNTER — Encounter: Payer: MEDICARE | Attending: Hematology | Primary: Family Medicine

## 2020-04-05 ENCOUNTER — Encounter: Primary: Family Medicine

## 2020-04-05 ENCOUNTER — Inpatient Hospital Stay: Primary: Family Medicine

## 2020-04-06 ENCOUNTER — Encounter: Primary: Family Medicine

## 2020-04-06 ENCOUNTER — Encounter: Payer: MEDICARE | Attending: Hematology | Primary: Family Medicine

## 2020-04-06 ENCOUNTER — Ambulatory Visit: Payer: PRIVATE HEALTH INSURANCE | Primary: Family Medicine

## 2020-04-08 ENCOUNTER — Ambulatory Visit: Primary: Family Medicine

## 2020-04-08 ENCOUNTER — Encounter: Attending: Hematology | Primary: Family Medicine

## 2020-04-08 ENCOUNTER — Encounter: Primary: Family Medicine

## 2020-07-12 DIAGNOSIS — D5700 Hb-SS disease with crisis, unspecified: Secondary | ICD-10-CM

## 2020-07-13 ENCOUNTER — Inpatient Hospital Stay: Admit: 2020-07-13 | Discharge: 2020-07-13 | Payer: MEDICARE | Attending: Emergency Medicine

## 2020-07-13 ENCOUNTER — Inpatient Hospital Stay: Admit: 2020-07-13 | Attending: Emergency Medicine | Primary: Family Medicine

## 2020-07-13 LAB — CBC WITH AUTOMATED DIFF
ABS. BASOPHILS: 0 10*3/uL (ref 0.0–0.2)
ABS. EOSINOPHILS: 0.1 10*3/uL (ref 0.0–0.8)
ABS. IMM. GRANS.: 0 10*3/uL (ref 0.0–0.5)
ABS. LYMPHOCYTES: 3.1 10*3/uL (ref 0.5–4.6)
ABS. MONOCYTES: 0.8 10*3/uL (ref 0.1–1.3)
ABS. NEUTROPHILS: 4.3 10*3/uL (ref 1.7–8.2)
ABSOLUTE NRBC: 0.15 10*3/uL (ref 0.0–0.2)
BASOPHILS: 0 % (ref 0.0–2.0)
EOSINOPHILS: 1 % (ref 0.5–7.8)
HCT: 28.1 % — ABNORMAL LOW (ref 35.8–46.3)
HGB: 9.9 g/dL — ABNORMAL LOW (ref 11.7–15.4)
IMMATURE GRANULOCYTES: 0 % (ref 0.0–5.0)
LYMPHOCYTES: 37 % (ref 13–44)
MCH: 39.1 PG — ABNORMAL HIGH (ref 26.1–32.9)
MCHC: 35.2 g/dL — ABNORMAL HIGH (ref 31.4–35.0)
MCV: 111.1 FL — ABNORMAL HIGH (ref 79.6–97.8)
MONOCYTES: 10 % (ref 4.0–12.0)
MPV: 9.4 FL (ref 9.4–12.3)
NEUTROPHILS: 52 % (ref 43–78)
PLATELET: 397 10*3/uL (ref 150–450)
RBC: 2.53 M/uL — ABNORMAL LOW (ref 4.05–5.2)
RDW: 15.9 % — ABNORMAL HIGH (ref 11.9–14.6)
WBC: 8.4 10*3/uL (ref 4.3–11.1)

## 2020-07-13 LAB — METABOLIC PANEL, COMPREHENSIVE
A-G Ratio: 1.2 (ref 1.2–3.5)
ALT (SGPT): 18 U/L (ref 12–65)
AST (SGOT): 24 U/L (ref 15–37)
Albumin: 4.4 g/dL (ref 3.5–5.0)
Alk. phosphatase: 102 U/L (ref 50–136)
Anion gap: 8 mmol/L (ref 7–16)
BUN: 10 MG/DL (ref 6–23)
Bilirubin, total: 1.8 MG/DL — ABNORMAL HIGH (ref 0.2–1.1)
CO2: 23 mmol/L (ref 21–32)
Calcium: 8.9 MG/DL (ref 8.3–10.4)
Chloride: 111 mmol/L — ABNORMAL HIGH (ref 98–107)
Creatinine: 1.01 MG/DL — ABNORMAL HIGH (ref 0.6–1.0)
GFR est AA: >60 mL/min/{1.73_m2} (ref >60)
GFR est non-AA: >60 mL/min/{1.73_m2} (ref >60)
Globulin: 3.8 g/dL — ABNORMAL HIGH (ref 2.3–3.5)
Glucose: 96 mg/dL (ref 65–100)
Potassium: 3.9 mmol/L (ref 3.5–5.1)
Protein, total: 8.2 g/dL (ref 6.3–8.2)
Sodium: 142 mmol/L (ref 136–145)

## 2020-07-13 LAB — RETICULOCYTE COUNT
Absolute Retic Cnt.: 0.1505 M/ul — ABNORMAL HIGH (ref 0.026–0.095)
Absolute Retic Cnt.: 0.1505 M/ul — ABNORMAL HIGH (ref 0.026–0.095)
Immature Retic Fraction: 22.9 % — ABNORMAL HIGH (ref 3.0–15.9)
Immature Retic Fraction: 22.9 % — ABNORMAL HIGH (ref 3.0–15.9)
Retic Ct Pct: 6.2 % — ABNORMAL HIGH (ref 0.3–2.0)
Reticulocyte count: 6.2 % — ABNORMAL HIGH (ref 0.3–2.0)

## 2020-07-13 LAB — D DIMER: D DIMER: 1.26 ug/ml(FEU) — ABNORMAL HIGH (ref <0.56)

## 2020-07-13 LAB — CBC WITH AUTO DIFFERENTIAL
Basophils %: 0 % (ref 0.0–2.0)
Basophils Absolute: 0 10*3/uL (ref 0.0–0.2)
Eosinophils %: 1 % (ref 0.5–7.8)
Eosinophils Absolute: 0.1 10*3/uL (ref 0.0–0.8)
Granulocyte Absolute Count: 0 10*3/uL (ref 0.0–0.5)
Hematocrit: 28.1 % — ABNORMAL LOW (ref 35.8–46.3)
Hemoglobin: 9.9 g/dL — ABNORMAL LOW (ref 11.7–15.4)
Immature Granulocytes: 0 % (ref 0.0–5.0)
Lymphocytes %: 37 % (ref 13–44)
Lymphocytes Absolute: 3.1 10*3/uL (ref 0.5–4.6)
MCH: 39.1 PG — ABNORMAL HIGH (ref 26.1–32.9)
MCHC: 35.2 g/dL — ABNORMAL HIGH (ref 31.4–35.0)
MCV: 111.1 FL — ABNORMAL HIGH (ref 79.6–97.8)
MPV: 9.4 FL (ref 9.4–12.3)
Monocytes %: 10 % (ref 4.0–12.0)
Monocytes Absolute: 0.8 10*3/uL (ref 0.1–1.3)
NRBC Absolute: 0.15 10*3/uL (ref 0.0–0.2)
Neutrophils %: 52 % (ref 43–78)
Neutrophils Absolute: 4.3 10*3/uL (ref 1.7–8.2)
Platelets: 397 10*3/uL (ref 150–450)
RBC: 2.53 M/uL — ABNORMAL LOW (ref 4.05–5.2)
RDW: 15.9 % — ABNORMAL HIGH (ref 11.9–14.6)
WBC: 8.4 10*3/uL (ref 4.3–11.1)

## 2020-07-13 LAB — COMPREHENSIVE METABOLIC PANEL
ALT: 18 U/L (ref 12–65)
AST: 24 U/L (ref 15–37)
Albumin/Globulin Ratio: 1.2 (ref 1.2–3.5)
Albumin: 4.4 g/dL (ref 3.5–5.0)
Alkaline Phosphatase: 102 U/L (ref 50–136)
Anion Gap: 8 mmol/L (ref 7–16)
BUN: 10 MG/DL (ref 6–23)
CO2: 23 mmol/L (ref 21–32)
Calcium: 8.9 MG/DL (ref 8.3–10.4)
Chloride: 111 mmol/L — ABNORMAL HIGH (ref 98–107)
Creatinine: 1.01 MG/DL — ABNORMAL HIGH (ref 0.6–1.0)
EGFR IF NonAfrican American: >60 mL/min/{1.73_m2} (ref >60)
GFR African American: >60 mL/min/{1.73_m2} (ref >60)
Globulin: 3.8 g/dL — ABNORMAL HIGH (ref 2.3–3.5)
Glucose: 96 mg/dL (ref 65–100)
Potassium: 3.9 mmol/L (ref 3.5–5.1)
Sodium: 142 mmol/L (ref 136–145)
Total Bilirubin: 1.8 MG/DL — ABNORMAL HIGH (ref 0.2–1.1)
Total Protein: 8.2 g/dL (ref 6.3–8.2)

## 2020-07-13 LAB — D-DIMER, QUANTITATIVE: D-Dimer, Quant: 1.26 ug/ml(FEU) — ABNORMAL HIGH (ref <0.56)

## 2020-07-13 NOTE — ED Provider Notes (Signed)
44 year old female with a history of sickle cell anemia having body aches and pain.  Patient never made it to the room I evaluated her in triage started lab work and she decided to leave prior to completion of work-up.    The history is provided by the patient.        Past Medical History:   Diagnosis Date   ??? Seizures (HCC)    ??? Sickle cell disease (HCC)    ??? Stroke [facility])        History reviewed. No pertinent surgical history.      History reviewed. No pertinent family history.    Social History     Socioeconomic History   ??? Marital status: SINGLE     Spouse name: Not on file   ??? Number of children: Not on file   ??? Years of education: Not on file   ??? Highest education level: Not on file   Occupational History   ??? Not on file   Tobacco Use   ??? Smoking status: Never Smoker   ??? Smokeless tobacco: Never Used   Substance and Sexual Activity   ??? Alcohol use: Never   ??? Drug use: Never   ??? Sexual activity: Not on file   Other Topics Concern   ??? Not on file   Social History Narrative   ??? Not on file     Social Determinants of Health     Financial Resource Strain:    ??? Difficulty of Paying Living Expenses:    Food Insecurity:    ??? Worried About Programme researcher, broadcasting/film/video in the Last Year:    ??? Barista in the Last Year:    Transportation Needs:    ??? Freight forwarder (Medical):    ??? Lack of Transportation (Non-Medical):    Physical Activity:    ??? Days of Exercise per Week:    ??? Minutes of Exercise per Session:    Stress:    ??? Feeling of Stress :    Social Connections:    ??? Frequency of Communication with Friends and Family:    ??? Frequency of Social Gatherings with Friends and Family:    ??? Attends Religious Services:    ??? Database administrator or Organizations:    ??? Attends Engineer, structural:    ??? Marital Status:    Intimate Programme researcher, broadcasting/film/video Violence:    ??? Fear of Current or Ex-Partner:    ??? Emotionally Abused:    ??? Physically Abused:    ??? Sexually Abused:          ALLERGIES: Latex, natural rubber; Demerol  [meperidine]; Fentanyl; Flu vaccine 2011 (36 mos+)(pf); Morphine; and Oxycontin [oxycodone]    Review of Systems   Constitutional: Negative.  Negative for activity change.   HENT: Negative.    Eyes: Negative.    Genitourinary: Negative.    Musculoskeletal: Positive for arthralgias, back pain and myalgias.   Skin: Negative.    Neurological: Negative.    Psychiatric/Behavioral: Negative.    All other systems reviewed and are negative.      Vitals:    07/13/20 0039   BP: 128/83   Pulse: 89   Resp: 15   Temp: 99.4 ??F (37.4 ??C)   SpO2: 99%            Physical Exam  Vitals and nursing note reviewed.   Constitutional:       General: She is not in acute distress.  Appearance: She is well-developed.   HENT:      Head: Normocephalic and atraumatic.      Right Ear: External ear normal.      Left Ear: External ear normal.      Nose: Nose normal.   Eyes:      General: No scleral icterus.        Right eye: No discharge.         Left eye: No discharge.      Conjunctiva/sclera: Conjunctivae normal.      Pupils: Pupils are equal, round, and reactive to light.   Cardiovascular:      Rate and Rhythm: Regular rhythm.   Pulmonary:      Effort: Pulmonary effort is normal. No respiratory distress.      Breath sounds: Normal breath sounds. No stridor. No wheezing or rales.   Abdominal:      General: There is no distension.      Tenderness: There is no abdominal tenderness.   Musculoskeletal:         General: Normal range of motion.      Cervical back: Normal range of motion.   Skin:     General: Skin is warm and dry.      Findings: No rash.   Neurological:      Mental Status: She is alert and oriented to person, place, and time.      Motor: No abnormal muscle tone.      Coordination: Coordination normal.   Psychiatric:         Behavior: Behavior normal.          MDM  Number of Diagnoses or Management Options  Diagnosis management comments: Limited exam as it was done in the triage bay patient did ambulate to the room decided to leave  prior to completion of treatment and testing.       Amount and/or Complexity of Data Reviewed  Clinical lab tests: ordered and reviewed  Tests in the medicine section of CPT??: ordered and reviewed  Decide to obtain previous medical records or to obtain history from someone other than the patient: yes  Review and summarize past medical records: yes    Risk of Complications, Morbidity, and/or Mortality  Presenting problems: moderate  Diagnostic procedures: moderate  Management options: moderate    Patient Progress  Patient progress: stable         Procedures

## 2020-07-13 NOTE — ED Notes (Signed)
Not in WR when called.

## 2020-07-16 ENCOUNTER — Emergency Department: Admit: 2020-07-16 | Payer: MEDICARE | Primary: Family Medicine

## 2020-07-16 ENCOUNTER — Inpatient Hospital Stay
Admit: 2020-07-16 | Discharge: 2020-07-16 | Disposition: A | Discharge status: Home / Self Care | Payer: MEDICARE | Attending: Emergency Medicine

## 2020-07-16 DIAGNOSIS — D5700 Hb-SS disease with crisis, unspecified: Secondary | ICD-10-CM

## 2020-07-16 LAB — EKG, 12 LEAD, INITIAL
Atrial Rate: 84 {beats}/min
Calculated P Axis: 72 degrees
Calculated R Axis: 48 degrees
Calculated T Axis: 46 degrees
Diagnosis: NORMAL
P-R Interval: 152 ms
Q-T Interval: 354 ms
QRS Duration: 72 ms
QTC Calculation (Bezet): 418 ms
Ventricular Rate: 84 {beats}/min

## 2020-07-16 LAB — CBC WITH AUTOMATED DIFF
ABS. BASOPHILS: 0 10*3/uL (ref 0.0–0.2)
ABS. EOSINOPHILS: 0.1 10*3/uL (ref 0.0–0.8)
ABS. IMM. GRANS.: 0 10*3/uL (ref 0.0–0.5)
ABS. LYMPHOCYTES: 2.4 10*3/uL (ref 0.5–4.6)
ABS. MONOCYTES: 0.8 10*3/uL (ref 0.1–1.3)
ABS. NEUTROPHILS: 4 10*3/uL (ref 1.7–8.2)
ABSOLUTE NRBC: 0.16 10*3/uL (ref 0.0–0.2)
BASOPHILS: 0 % (ref 0.0–2.0)
EOSINOPHILS: 1 % (ref 0.5–7.8)
HCT: 26.8 % — ABNORMAL LOW (ref 35.8–46.3)
HGB: 9.5 g/dL — ABNORMAL LOW (ref 11.7–15.4)
IMMATURE GRANULOCYTES: 1 % (ref 0.0–5.0)
LYMPHOCYTES: 33 % (ref 13–44)
MCH: 39.6 PG — ABNORMAL HIGH (ref 26.1–32.9)
MCHC: 35.4 g/dL — ABNORMAL HIGH (ref 31.4–35.0)
MCV: 111.7 FL — ABNORMAL HIGH (ref 79.6–97.8)
MONOCYTES: 10 % (ref 4.0–12.0)
MPV: 9.1 FL — ABNORMAL LOW (ref 9.4–12.3)
NEUTROPHILS: 55 % (ref 43–78)
PLATELET: 472 10*3/uL — ABNORMAL HIGH (ref 150–450)
RBC: 2.4 M/uL — ABNORMAL LOW (ref 4.05–5.2)
RDW: 15.9 % — ABNORMAL HIGH (ref 11.9–14.6)
WBC: 7.3 10*3/uL (ref 4.3–11.1)

## 2020-07-16 LAB — MAGNESIUM
Magnesium: 2.1 mg/dL (ref 1.8–2.4)
Magnesium: 2.1 mg/dL (ref 1.8–2.4)

## 2020-07-16 LAB — METABOLIC PANEL, COMPREHENSIVE
A-G Ratio: 1.1 — ABNORMAL LOW (ref 1.2–3.5)
ALT (SGPT): 17 U/L (ref 12–65)
AST (SGOT): 19 U/L (ref 15–37)
Albumin: 4.1 g/dL (ref 3.5–5.0)
Alk. phosphatase: 100 U/L (ref 50–136)
Anion gap: 7 mmol/L (ref 7–16)
BUN: 9 MG/DL (ref 6–23)
Bilirubin, total: 1.4 MG/DL — ABNORMAL HIGH (ref 0.2–1.1)
CO2: 24 mmol/L (ref 21–32)
Calcium: 8.8 MG/DL (ref 8.3–10.4)
Chloride: 109 mmol/L — ABNORMAL HIGH (ref 98–107)
Creatinine: 1.01 MG/DL — ABNORMAL HIGH (ref 0.6–1.0)
GFR est AA: >60 mL/min/{1.73_m2} (ref >60)
GFR est non-AA: >60 mL/min/{1.73_m2} (ref >60)
Globulin: 3.8 g/dL — ABNORMAL HIGH (ref 2.3–3.5)
Glucose: 95 mg/dL (ref 65–100)
Potassium: 3.7 mmol/L (ref 3.5–5.1)
Protein, total: 7.9 g/dL (ref 6.3–8.2)
Sodium: 140 mmol/L (ref 136–145)

## 2020-07-16 LAB — TROPONIN-HIGH SENSITIVITY: Troponin-High Sensitivity: 6.3 pg/mL (ref 0–14)

## 2020-07-16 LAB — RETICULOCYTE COUNT
Absolute Retic Cnt.: 0.1838 M/ul — ABNORMAL HIGH (ref 0.026–0.095)
Absolute Retic Cnt.: 0.1838 M/ul — ABNORMAL HIGH (ref 0.026–0.095)
Immature Retic Fraction: 30.7 % — ABNORMAL HIGH (ref 3.0–15.9)
Immature Retic Fraction: 30.7 % — ABNORMAL HIGH (ref 3.0–15.9)
Retic Ct Pct: 7.7 % — ABNORMAL HIGH (ref 0.3–2.0)
Retic Hgb Conc.: 39 pg — ABNORMAL HIGH (ref 29–35)
Retic Hgb Conc.: 39 pg — ABNORMAL HIGH (ref 29–35)
Reticulocyte count: 7.7 % — ABNORMAL HIGH (ref 0.3–2.0)

## 2020-07-16 LAB — NT-PRO BNP: NT pro-BNP: 210 PG/ML — ABNORMAL HIGH (ref 5–125)

## 2020-07-16 LAB — COVID-19 RAPID TEST: COVID-19 rapid test: NOT DETECTED

## 2020-07-16 LAB — LIPASE
Lipase: 168 U/L (ref 73–393)
Lipase: 168 U/L (ref 73–393)

## 2020-07-16 LAB — CBC WITH AUTO DIFFERENTIAL
Basophils %: 0 % (ref 0.0–2.0)
Basophils Absolute: 0 10*3/uL (ref 0.0–0.2)
Eosinophils %: 1 % (ref 0.5–7.8)
Eosinophils Absolute: 0.1 10*3/uL (ref 0.0–0.8)
Granulocyte Absolute Count: 0 10*3/uL (ref 0.0–0.5)
Hematocrit: 26.8 % — ABNORMAL LOW (ref 35.8–46.3)
Hemoglobin: 9.5 g/dL — ABNORMAL LOW (ref 11.7–15.4)
Immature Granulocytes: 1 % (ref 0.0–5.0)
Lymphocytes %: 33 % (ref 13–44)
Lymphocytes Absolute: 2.4 10*3/uL (ref 0.5–4.6)
MCH: 39.6 PG — ABNORMAL HIGH (ref 26.1–32.9)
MCHC: 35.4 g/dL — ABNORMAL HIGH (ref 31.4–35.0)
MCV: 111.7 FL — ABNORMAL HIGH (ref 79.6–97.8)
MPV: 9.1 FL — ABNORMAL LOW (ref 9.4–12.3)
Monocytes %: 10 % (ref 4.0–12.0)
Monocytes Absolute: 0.8 10*3/uL (ref 0.1–1.3)
NRBC Absolute: 0.16 10*3/uL (ref 0.0–0.2)
Neutrophils %: 55 % (ref 43–78)
Neutrophils Absolute: 4 10*3/uL (ref 1.7–8.2)
Platelets: 472 10*3/uL — ABNORMAL HIGH (ref 150–450)
RBC: 2.4 M/uL — ABNORMAL LOW (ref 4.05–5.2)
RDW: 15.9 % — ABNORMAL HIGH (ref 11.9–14.6)
WBC: 7.3 10*3/uL (ref 4.3–11.1)

## 2020-07-16 LAB — COMPREHENSIVE METABOLIC PANEL
ALT: 17 U/L (ref 12–65)
AST: 19 U/L (ref 15–37)
Albumin/Globulin Ratio: 1.1 — ABNORMAL LOW (ref 1.2–3.5)
Albumin: 4.1 g/dL (ref 3.5–5.0)
Alkaline Phosphatase: 100 U/L (ref 50–136)
Anion Gap: 7 mmol/L (ref 7–16)
BUN: 9 MG/DL (ref 6–23)
CO2: 24 mmol/L (ref 21–32)
Calcium: 8.8 MG/DL (ref 8.3–10.4)
Chloride: 109 mmol/L — ABNORMAL HIGH (ref 98–107)
Creatinine: 1.01 MG/DL — ABNORMAL HIGH (ref 0.6–1.0)
EGFR IF NonAfrican American: >60 mL/min/{1.73_m2} (ref >60)
GFR African American: >60 mL/min/{1.73_m2} (ref >60)
Globulin: 3.8 g/dL — ABNORMAL HIGH (ref 2.3–3.5)
Glucose: 95 mg/dL (ref 65–100)
Potassium: 3.7 mmol/L (ref 3.5–5.1)
Sodium: 140 mmol/L (ref 136–145)
Total Bilirubin: 1.4 MG/DL — ABNORMAL HIGH (ref 0.2–1.1)
Total Protein: 7.9 g/dL (ref 6.3–8.2)

## 2020-07-16 LAB — EKG 12-LEAD
Atrial Rate: 84 {beats}/min
Diagnosis: NORMAL
P Axis: 72 degrees
P-R Interval: 152 ms
Q-T Interval: 354 ms
QRS Duration: 72 ms
QTc Calculation (Bazett): 418 ms
R Axis: 48 degrees
T Axis: 46 degrees
Ventricular Rate: 84 {beats}/min

## 2020-07-16 LAB — TROPONIN, HIGH SENSITIVITY: Troponin, High Sensitivity: 6.3 pg/mL (ref 0–14)

## 2020-07-16 LAB — COVID-19, RAPID: SARS-CoV-2, Rapid: NOT DETECTED

## 2020-07-16 LAB — PROBNP, N-TERMINAL: BNP: 210 PG/ML — ABNORMAL HIGH (ref 5–125)

## 2020-07-16 MED ORDER — ONDANSETRON (PF) 4 MG/2 ML INJECTION
4 mg/2 mL | INTRAMUSCULAR | Status: AC
Start: 2020-07-16 — End: 2020-07-16
  Administered 2020-07-16: 10:00:00 via INTRAVENOUS

## 2020-07-16 MED ORDER — DIPHENHYDRAMINE HCL 50 MG/ML IJ SOLN
50 mg/mL | INTRAMUSCULAR | Status: AC
Start: 2020-07-16 — End: 2020-07-16
  Administered 2020-07-16: 11:00:00 via INTRAVENOUS

## 2020-07-16 MED ORDER — SODIUM CHLORIDE 0.9 % IJ SYRG
Freq: Three times a day (TID) | INTRAMUSCULAR | Status: DC
Start: 2020-07-16 — End: 2020-07-16

## 2020-07-16 MED ORDER — SODIUM CHLORIDE 0.9 % IJ SYRG
INTRAMUSCULAR | Status: DC | PRN
Start: 2020-07-16 — End: 2020-07-16

## 2020-07-16 MED ORDER — HYDROMORPHONE 1 MG/ML INJECTION SOLUTION
1 mg/mL | INTRAMUSCULAR | Status: AC
Start: 2020-07-16 — End: 2020-07-16
  Administered 2020-07-16: 10:00:00 via INTRAVENOUS

## 2020-07-16 MED ORDER — HYDROMORPHONE 1 MG/ML INJECTION SOLUTION
1 mg/mL | INTRAMUSCULAR | Status: AC
Start: 2020-07-16 — End: 2020-07-16
  Administered 2020-07-16: 11:00:00 via INTRAVENOUS

## 2020-07-16 MED ORDER — SODIUM CHLORIDE 0.9% BOLUS IV
0.9 % | Freq: Once | INTRAVENOUS | Status: AC
Start: 2020-07-16 — End: 2020-07-16
  Administered 2020-07-16: 10:00:00 via INTRAVENOUS

## 2020-07-16 MED FILL — ONDANSETRON (PF) 4 MG/2 ML INJECTION: 4 mg/2 mL | INTRAMUSCULAR | Qty: 4

## 2020-07-16 MED FILL — HYDROMORPHONE (PF) 1 MG/ML IJ SOLN: 1 mg/mL | INTRAMUSCULAR | Qty: 1

## 2020-07-16 MED FILL — DIPHENHYDRAMINE HCL 50 MG/ML IJ SOLN: 50 mg/mL | INTRAMUSCULAR | Qty: 1

## 2020-07-16 NOTE — ED Notes (Signed)

## 2020-07-16 NOTE — ED Notes (Signed)
patient states pain all over. States that it started Sunday, attempted to be seen here Monday but left. States she has taken tylenol for the pain. Patient states some shortness of breath as well.

## 2020-07-16 NOTE — ED Provider Notes (Signed)
[place]     [name] Bright is a 44 y.o. female seen on 07/16/2020 in the [facility] EMERGENCY DEPT in room ER10/10.    Chief Complaint   Patient presents with   ??? Chest Pain     HPI: 44 year old African-American with history of sickle cell anemia presented the emergency department with complaints of generalized body aches that began 5 days ago.  Patient states that she was seen here in the emergency department on Monday but left because the wait was too long.  At that time patient had labs performed.  Patient with elevated reticulocyte count and D-dimer.  Patient has has a history of pulmonary embolisms but has been taking her Xarelto as prescribed.  Patient denies any fevers however her temperature upon arrival was 99.8.  Patient has been having chills, cough, shortness of breath, increased fatigue that she has been contributing to a sickle cell flare.  She has been taking Tylenol for the pain.  Patient states that she has chronic right shoulder pain and when her sickle cell flares up it hurts even more. patient has not had Covid nor has she been vaccinated for Covid.  She states that she does not have any Covid exposures because she never leaves the house.  She denies any abdominal pain, nausea, vomiting or diarrhea or changes in bowel or bladder habits.  She has no other complaints at this time.    Historian: Patient/Previous Medical Record    REVIEW OF SYSTEMS     Review of Systems   Constitutional: Positive for chills and fatigue.   HENT: Negative.    Respiratory: Positive for cough, chest tightness and shortness of breath.    Cardiovascular: Positive for chest pain.   Gastrointestinal: Negative.    Genitourinary: Negative.    Musculoskeletal: Positive for arthralgias and myalgias.   Skin: Negative.    Neurological: Negative.    Psychiatric/Behavioral: Negative.    All other systems reviewed and are negative.      PAST MEDICAL HISTORY     Past Medical History:   Diagnosis Date    ??? Seizures ([place])    ??? Sickle cell disease ([place])    ??? Stroke [facility])      No past surgical history on file.  Social History     Socioeconomic History   ??? Marital status: SINGLE     Spouse name: Not on file   ??? Number of children: Not on file   ??? Years of education: Not on file   ??? Highest education level: Not on file   Tobacco Use   ??? Smoking status: Never Smoker   ??? Smokeless tobacco: Never Used   Substance and Sexual Activity   ??? Alcohol use: Never   ??? Drug use: Never     Social Determinants of Company secretary Strain:    ??? Difficulty of Paying Living Expenses:    Food Insecurity:    ??? Worried About Charity fundraiser in the Last Year:    ??? Arboriculturist in the Last Year:    Transportation Needs:    ??? Film/video editor (Medical):    ??? Lack of Transportation (Non-Medical):    Physical Activity:    ??? Days of Exercise per Week:    ??? Minutes of Exercise per Session:    Stress:    ??? Feeling of Stress :    Social Connections:    ??? Frequency of Communication  with Friends and Family:    ??? Frequency of Social Gatherings with Friends and Family:    ??? Attends Religious Services:    ??? Marine scientist or Organizations:    ??? Attends Archivist Meetings:    ??? Marital Status:      Prior to Admission Medications   Prescriptions Last Dose Informant Patient Reported? Taking?   FLUoxetine (PROzac) 20 mg capsule   Yes No   Sig: Take 20 mg by mouth daily.   cetirizine (ZYRTEC) 10 mg tablet   Yes No   Sig: Take  by mouth.   diphenhydrAMINE (BENADRYL) 25 mg tablet   Yes No   Sig: Take 25 mg by mouth.   diphenhydrAMINE-acetaminophen 25-500 mg tab   Yes No   Sig: Take 1 Tab by mouth daily.   folic acid (FOLVITE) 1 mg tablet   Yes No   Sig: Take 1 mg by mouth daily.   hydroxyurea (HYDREA) 500 mg capsule   Yes No   Sig: Take 500 mg by mouth daily.   levETIRAcetam (KEPPRA) 750 mg tablet   Yes No   Sig: Take 1,500 mg by mouth two (2) times a day.   pantoprazole (PROTONIX) 40 mg tablet   No No   Sig: Take 1  Tab by mouth daily.      Facility-Administered Medications: None     Allergies   Allergen Reactions   ??? Latex, Natural Rubber Rash   ??? Demerol [Meperidine] Seizures   ??? Fentanyl Other (comments)     "my doctor said I had a stroke from it"   ??? Flu Vaccine 2011 (36 Mos+)(Pf) Swelling   ??? Morphine Itching   ??? Oxycontin [Oxycodone] Vertigo        PHYSICAL EXAM       Vitals:    07/16/20 0422 07/16/20 0427 07/16/20 0436 07/16/20 0613   BP: 130/74 122/71 121/64 126/72   Pulse: 88 86 84 85   Resp:       Temp:       SpO2: 97% 97% 99% 96%    Vital signs were reviewed.     Physical Exam  Vitals and nursing note reviewed.   Constitutional:       Appearance: She is well-developed. She is ill-appearing (Appears to not feel well).   HENT:      Head: Normocephalic and atraumatic.   Eyes:      Extraocular Movements: Extraocular movements intact.      Pupils: Pupils are equal, round, and reactive to light.   Cardiovascular:      Rate and Rhythm: Normal rate and regular rhythm.      Heart sounds: Normal heart sounds.   Pulmonary:      Effort: Pulmonary effort is normal.      Breath sounds: Normal breath sounds.   Abdominal:      Palpations: Abdomen is soft.      Tenderness: There is no abdominal tenderness.   Musculoskeletal:      Cervical back: Normal range of motion.      Right lower leg: No edema.      Left lower leg: No edema.      Comments: Tenderness diffusely with light palpation.  Pain worse in the right shoulder.  Pain to all joints with range of motion.   Skin:     General: Skin is warm and dry.   Neurological:      General: No focal deficit present.  Mental Status: She is alert and oriented to person, place, and time.          MEDICAL DECISION MAKING     ED Course:    Orders Placed This Encounter   ??? COVID-19 RAPID TEST   ??? XR Chest Port (check if patient is roomed and on cardiac monitor)   ??? Troponin 2 Hour Repeat   ??? CBC   ??? CMP   ??? LIPASE   ??? MAGNESIUM   ??? TROPONIN-HIGH SENSITIVITY   ??? RETICULOCYTE COUNT   ??? NT-PRO  BNP   ??? Cardiac Monitoring   ??? PULSE OXIMETRY CONTINUOUS   ??? NURSING-MISCELLANEOUS: Please draw blue top tube and send to lab ONE TIME   ??? EKG   ??? SALINE LOCK IV ONE TIME Routine   ??? INSERT PERIPHERAL IV ONE TIME STAT   ??? sodium chloride (NS) flush 5-10 mL   ??? sodium chloride (NS) flush 5-10 mL   ??? HYDROmorphone (DILAUDID) injection 1 mg   ??? sodium chloride 0.9 % bolus infusion 1,000 mL   ??? ondansetron (ZOFRAN) injection 8 mg   ??? HYDROmorphone (DILAUDID) injection 1 mg   ??? diphenhydrAMINE (BENADRYL) injection 25 mg     Recent Results (from the past 8 hour(s))   EKG, 12 LEAD, INITIAL    Collection Time: 07/16/20  4:25 AM   Result Value Ref Range    Ventricular Rate 84 BPM    Atrial Rate 84 BPM    P-R Interval 152 ms    QRS Duration 72 ms    Q-T Interval 354 ms    QTC Calculation (Bezet) 418 ms    Calculated P Axis 72 degrees    Calculated R Axis 48 degrees    Calculated T Axis 46 degrees    Diagnosis       !! AGE AND GENDER SPECIFIC ECG ANALYSIS !!  Normal sinus rhythm  Nonspecific T wave abnormality  Abnormal ECG  When compared with ECG of 28-Jan-2020 23:35,  No significant change was found     CBC WITH AUTOMATED DIFF    Collection Time: 07/16/20  5:15 AM   Result Value Ref Range    WBC 7.3 4.3 - 11.1 K/uL    RBC 2.40 (L) 4.05 - 5.2 M/uL    HGB 9.5 (L) 11.7 - 15.4 g/dL    HCT 26.8 (L) 35.8 - 46.3 %    MCV 111.7 (H) 79.6 - 97.8 FL    MCH 39.6 (H) 26.1 - 32.9 PG    MCHC 35.4 (H) 31.4 - 35.0 g/dL    RDW 15.9 (H) 11.9 - 14.6 %    PLATELET 472 (H) 150 - 450 K/uL    MPV 9.1 (L) 9.4 - 12.3 FL    ABSOLUTE NRBC 0.16 0.0 - 0.2 K/uL    DF AUTOMATED      NEUTROPHILS 55 43 - 78 %    LYMPHOCYTES 33 13 - 44 %    MONOCYTES 10 4.0 - 12.0 %    EOSINOPHILS 1 0.5 - 7.8 %    BASOPHILS 0 0.0 - 2.0 %    IMMATURE GRANULOCYTES 1 0.0 - 5.0 %    ABS. NEUTROPHILS 4.0 1.7 - 8.2 K/UL    ABS. LYMPHOCYTES 2.4 0.5 - 4.6 K/UL    ABS. MONOCYTES 0.8 0.1 - 1.3 K/UL    ABS. EOSINOPHILS 0.1 0.0 - 0.8 K/UL    ABS. BASOPHILS 0.0 0.0 - 0.2 K/UL    ABS. IMM.  GRANS.  0.0 0.0 - 0.5 K/UL   METABOLIC PANEL, COMPREHENSIVE    Collection Time: 07/16/20  5:15 AM   Result Value Ref Range    Sodium 140 136 - 145 mmol/L    Potassium 3.7 3.5 - 5.1 mmol/L    Chloride 109 (H) 98 - 107 mmol/L    CO2 24 21 - 32 mmol/L    Anion gap 7 7 - 16 mmol/L    Glucose 95 65 - 100 mg/dL    BUN 9 6 - 23 MG/DL    Creatinine 1.01 (H) 0.6 - 1.0 MG/DL    GFR est AA >60 >60 ml/min/1.21m    GFR est non-AA >60 >60 ml/min/1.775m   Calcium 8.8 8.3 - 10.4 MG/DL    Bilirubin, total 1.4 (H) 0.2 - 1.1 MG/DL    ALT (SGPT) 17 12 - 65 U/L    AST (SGOT) 19 15 - 37 U/L    Alk. phosphatase 100 50 - 136 U/L    Protein, total 7.9 6.3 - 8.2 g/dL    Albumin 4.1 3.5 - 5.0 g/dL    Globulin 3.8 (H) 2.3 - 3.5 g/dL    A-G Ratio 1.1 (L) 1.2 - 3.5     LIPASE    Collection Time: 07/16/20  5:15 AM   Result Value Ref Range    Lipase 168 73 - 393 U/L   MAGNESIUM    Collection Time: 07/16/20  5:15 AM   Result Value Ref Range    Magnesium 2.1 1.8 - 2.4 mg/dL   TROPONIN-HIGH SENSITIVITY    Collection Time: 07/16/20  5:15 AM   Result Value Ref Range    Troponin-High Sensitivity 6.3 0 - 14 pg/mL   RETICULOCYTE COUNT    Collection Time: 07/16/20  5:15 AM   Result Value Ref Range    Reticulocyte count 7.7 (H) 0.3 - 2.0 %    Absolute Retic Cnt. 0.1838 (H) 0.026 - 0.095 M/ul    Immature Retic Fraction 30.7 (H) 3.0 - 15.9 %    Retic Hgb Conc. 39 (H) 29 - 35 pg   NT-PRO BNP    Collection Time: 07/16/20  5:15 AM   Result Value Ref Range    NT pro-BNP 210 (H) 5 - 125 PG/ML   COVID-19 RAPID TEST    Collection Time: 07/16/20  6:13 AM   Result Value Ref Range    Specimen source NASAL      COVID-19 rapid test Not detected NOTD       XR CHEST PORT    Result Date: 07/16/2020  EXAM: XR CHEST PORT HISTORY: shortness of breath.  TECHNIQUE: Frontal chest. COMPARISON: 07/13/2020 FINDINGS: The cardiac silhouette, mediastinum, and pulmonary vasculature are within normal limits. There is no consolidation, pleural effusion, or pneumothorax. Stable mild  blunting of left costophrenic angle. No significant osseous abnormalities are observed.     No evidence of an acute intrathoracic process. Stable blunting of the left costophrenic angle may indicate a tiny pleural effusion or pleural thickening.    EKG interpretation: Rate 84.  Normal sinus rhythm.  Nonspecific T wave abnormalities.  Normal PR and QT intervals.  No ischemic changes.    ED Course as of Jul 17 719   Fri Jul 16, 2020   0717 Patient is feeling better.  Discussed options of staying in the hospital or following up with her doctor.  Patient will be discharged home.  She will return the emergency department for any concerns.    [JL]  ED Course User Index  [JL] Kayren Eaves, DO         MDM  Number of Diagnoses or Management Options  Sickle cell pain crisis [facility])  Diagnosis management comments: 44 year old African-American female with history of sickle cell anemia presented emergency department with 4 to 5days diffuse body aches, chest pain and shortness of breath.  Patient's history exam as well as lab values consistent with sickle cell flare and pain crisis.  Patient feeling better after fluids and pain medicine.  She would like to be discharged home.  I did discuss admission versus discharge and patient elects to be discharged home.  She will follow up with her doctor and will return the emergency department for any concerns.       Amount and/or Complexity of Data Reviewed  Clinical lab tests: ordered and reviewed  Tests in the radiology section of CPT??: ordered and reviewed  Decide to obtain previous medical records or to obtain history from someone other than the patient: yes  Review and summarize past medical records: yes  Independent visualization of images, tracings, or specimens: yes    Patient Progress  Patient progress: improved        Disposition:  Discharged  Diagnosis:     ICD-10-CM ICD-9-CM   1. Sickle cell pain crisis ([place])  D57.00 282.62      ____________________________________________________________________  A portion of this note was generated using voice recognition dictation software. While the note has been reviewed for accuracy, please note certain words and phrases may not be transcribed as intended and some grammatical and/or typographical errors may be present.

## 2020-11-20 ENCOUNTER — Emergency Department: Payer: MEDICARE | Primary: Family Medicine

## 2020-11-20 NOTE — ED Provider Notes (Signed)
ED Provider Notes by Lyda KalataBenetatos, Serrina Minogue D, NP at 11/20/20 2352                Author: Lyda KalataBenetatos, Neola Worrall D, NP  Service: Emergency Medicine  Author Type: Nurse Practitioner       Filed: 11/21/20 0037  Date of Service: 11/20/20 2352  Status: Attested           Editor: Jaquavian Firkus, Bettye BoeckPhilip D, NP (Nurse Practitioner)  Cosigner: Lauris PoagHartzog, Joseph, MD at 11/21/20 0451          Attestation signed by Lauris PoagHartzog, Joseph, MD at 11/21/20 0451          As present and available for consultation on this patient during the beginning of her treatment in the emergency department.  Assumed care of the patient at  shift change when nurse practitioner shift ended.    Was able to gain IV access with ultrasound right antecubital area.  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  Is also note she has right  middle lobe infiltrate indicative of pneumonia.    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    Considering her sickle cell anemia her unimmunized condition and asplenic ask  hospitalist admit for IV antibiotics.  Patient not tachypneic tachycardic or hypoxic time of admission.    Rocephin and azithromycin IV were ordered on this patient however when she began the Rocephin she began to have pruritic complaints.  This  was stopped Benadryl was given further antibiotics be determined in patient.                                 HPI    44 year old female with history significant for PE, DVT, seizures, sickle cell disease, stroke; presents to the emergency department with complaints of productive cough of green-colored expectorate x5 days, left and right-sided chest pain.    States that she is been feeling poorly since onset of symptoms on Monday, but developed left-sided chest pain yesterday and right-sided pain today.  Pain is localized to the lateral ribs.  Pain is increased with inspiration and palpation.   Not toxic  appearing.  Not hypoxic, not tachycardic or tachypneic.  Takes no blood thinners.  Reports prior PE.  Has cardiac history.  States her mother had recent URI symptoms.  She has not vaccinated its Covid or influenza.        Past Medical History:        Diagnosis  Date         ?  Seizures (HCC)       ?  Sickle cell disease (HCC)           ?  Stroke [facility](HCC)             No past surgical history on file.        No family history on file.        Social History          Socioeconomic History         ?  Marital status:  SINGLE              Spouse name:  Not on file         ?  Number of children:  Not on file     ?  Years of education:  Not on file     ?  Highest education level:  Not on file       Occupational History        ?  Not on file       Tobacco Use         ?  Smoking status:  Never Smoker     ?  Smokeless tobacco:  Never Used       Substance and Sexual Activity         ?  Alcohol use:  Never     ?  Drug use:  Never     ?  Sexual activity:  Not on file        Other Topics  Concern        ?  Not on file       Social History Narrative        ?  Not on file          Social Determinants of Health          Financial Resource Strain:         ?  Difficulty of Paying Living Expenses: Not on file       Food Insecurity:         ?  Worried About Running Out of Food in the Last Year: Not on file     ?  Ran Out of Food in the Last Year: Not on file       Transportation Needs:         ?  Lack of Transportation (Medical): Not on file     ?  Lack of Transportation (Non-Medical): Not on file       Physical Activity:         ?  Days of Exercise per Week: Not on file     ?  Minutes of Exercise per Session: Not on file       Stress:         ?  Feeling of Stress : Not on file       Social Connections:         ?  Frequency of Communication with Friends and Family: Not on file     ?  Frequency of Social Gatherings with Friends and Family: Not on file     ?  Attends Religious Services: Not on file     ?  Active Member of Clubs or  Organizations: Not on file     ?  Attends Banker Meetings: Not on file     ?  Marital Status: Not on file       Intimate Partner Violence:         ?  Fear of Current or Ex-Partner: Not on file     ?  Emotionally Abused: Not on file     ?  Physically Abused: Not on file     ?  Sexually Abused: Not on file       Housing Stability:         ?  Unable to Pay for Housing in the Last Year: Not on file     ?  Number of Places Lived in the Last Year: Not on file        ?  Unstable Housing in the Last Year: Not on file              ALLERGIES: Latex, natural rubber; Demerol [meperidine]; Fentanyl; Flu vaccine 2011 (36  mos+)(pf); Morphine; and Oxycontin [oxycodone]      Review of Systems   Constitutional: Negative for fever. Negative for appetite change, chills, diaphoresis and unexpected weight change.     HENT: Negative      Eyes: Negative    Respiratory: As in HPI    cardiovascular: As in HPI    musculoskeletal: As in HPI    skin: Negative      Allergic/Immunologic: Negative   Neurological: Negative                                     Vitals:            11/20/20 2322  11/20/20 2334  11/20/20 2335          BP:  (!) 133/95  126/79       Pulse:  89         Resp:  18         Temp:  99.6 ??F (37.6 ??C)         SpO2:  96%    94%     Weight:  79.8 kg (176 lb)              Height:  5\' 8"  (1.727 m)                    Physical Exam    Constitutional: Oriented to person, place, and time. Appears well-developed and well-nourished. No distress.     HENT:     Head: Normocephalic and atraumatic    Right Ear: External ear normal.     Left Ear: External ear normal.      Nose: Nose normal.    Mouth/Throat: Mouth normal.     Eyes: Conjunctivae are normal. Pupils are equal, round, and reactive to light.    Neck: Supple. No tracheal deviation.    Cardiovascular: Normal rate, intact distal pulses. Brisk capillary refill intact, less than 2 seconds. Regular rhythm present. No pitting edema.     Pulmonary/Chest: Lungs with crackles  bilateral bilaterally.  No respiratory distress.  + Left lateral and right lateral chest wall tenderness, no crepitus or instability   Abdominal: Soft. There is no tenderness.     Musculoskeletal: No obvious deformity, erythema, edema.    Neurological: Alert and oriented to person, place, and time. No numbness/tingling. No loss of sensation. Positive PMS ??4. GCS= 15.     Skin: Skin is warm and dry. Capillary refill takes less than 2 seconds. No abrasion, no lesion, no petechiae and no rash noted. Not diaphoretic. No cyanosis, erythema, or pallor.     Psychiatric: Normal mood and affect. Behavior is normal.     Nursing note and vitals reviewed.       MDM    44 year old female in the ED with active cough, reports green and blood-tinged expectorate.  States symptoms of been present for 5 days.  States  that she began experiencing chest pain yesterday at the left lateral aspect of her chest, now with right chest pain.  Not vaccinations Covid or flu.  History of PE.  History of sickle cell.   Low clinical suspicion of ACS, acute chest or sickle cell crisis.  Consideration given towards acute viral syndrome such as COVID-19, cannot exclude PE.  Will obtain EKG, labs, chest x-ray, Covid and flu swabs.  Chest x-ray with no acute emergent process.   We will  proceed with CT PE.  This with the ED attending.  Patient signed out to ED attending at 0100 conclusion of shift      Procedures

## 2020-11-20 NOTE — ED Notes (Signed)
Pt reports 2 days of productive cough, states she has body aches and chills. Reports mother is sick with same illness in the home.

## 2020-11-21 ENCOUNTER — Inpatient Hospital Stay
Admit: 2020-11-21 | Discharge: 2020-11-24 | Disposition: A | Discharge status: Home / Self Care | Payer: MEDICARE | Attending: Student in an Organized Health Care Education/Training Program | Admitting: Student in an Organized Health Care Education/Training Program

## 2020-11-21 ENCOUNTER — Emergency Department: Admit: 2020-11-21 | Payer: MEDICARE | Primary: Family Medicine

## 2020-11-21 DIAGNOSIS — J189 Pneumonia, unspecified organism: Secondary | ICD-10-CM

## 2020-11-21 LAB — EKG, 12 LEAD, INITIAL
Atrial Rate: 74 {beats}/min
Calculated P Axis: 65 degrees
Calculated R Axis: 52 degrees
Calculated T Axis: 38 degrees
P-R Interval: 145 ms
Q-T Interval: 391 ms
QRS Duration: 83 ms
QTC Calculation (Bezet): 431 ms
Ventricular Rate: 73 {beats}/min

## 2020-11-21 LAB — METABOLIC PANEL, COMPREHENSIVE
A-G Ratio: 0.9 — ABNORMAL LOW (ref 1.2–3.5)
ALT (SGPT): 20 U/L (ref 12–65)
AST (SGOT): 30 U/L (ref 15–37)
Albumin: 3.9 g/dL (ref 3.5–5.0)
Alk. phosphatase: 126 U/L (ref 50–136)
Anion gap: 7 mmol/L (ref 7–16)
BUN: 8 MG/DL (ref 6–23)
Bilirubin, total: 0.9 MG/DL (ref 0.2–1.1)
CO2: 22 mmol/L (ref 21–32)
Calcium: 9.1 MG/DL (ref 8.3–10.4)
Chloride: 113 mmol/L — ABNORMAL HIGH (ref 98–107)
Creatinine: 1.06 MG/DL — ABNORMAL HIGH (ref 0.6–1.0)
GFR est AA: >60 mL/min/{1.73_m2} (ref >60)
GFR est non-AA: 59 mL/min/{1.73_m2} — ABNORMAL LOW (ref >60)
Globulin: 4.5 g/dL — ABNORMAL HIGH (ref 2.3–3.5)
Glucose: 102 mg/dL — ABNORMAL HIGH (ref 65–100)
Potassium: 4.3 mmol/L (ref 3.5–5.1)
Protein, total: 8.4 g/dL — ABNORMAL HIGH (ref 6.3–8.2)
Sodium: 142 mmol/L (ref 136–145)

## 2020-11-21 LAB — CBC WITH AUTOMATED DIFF
ABS. BASOPHILS: 0.1 10*3/uL (ref 0.0–0.2)
ABS. EOSINOPHILS: 0.1 10*3/uL (ref 0.0–0.8)
ABS. IMM. GRANS.: 0.1 10*3/uL (ref 0.0–0.5)
ABS. LYMPHOCYTES: 2.7 10*3/uL (ref 0.5–4.6)
ABS. MONOCYTES: 0.6 10*3/uL (ref 0.1–1.3)
ABS. NEUTROPHILS: 1.8 10*3/uL (ref 1.7–8.2)
ABSOLUTE NRBC: 0.04 10*3/uL (ref 0.0–0.2)
BASOPHILS: 1 % (ref 0.0–2.0)
EOSINOPHILS: 1 % (ref 0.5–7.8)
HCT: 25.2 % — ABNORMAL LOW (ref 35.8–46.3)
HGB: 9 g/dL — ABNORMAL LOW (ref 11.7–15.4)
IMMATURE GRANULOCYTES: 1 % (ref 0.0–5.0)
LYMPHOCYTES: 51 % — ABNORMAL HIGH (ref 13–44)
MCH: 38.6 PG — ABNORMAL HIGH (ref 26.1–32.9)
MCHC: 35.7 g/dL — ABNORMAL HIGH (ref 31.4–35.0)
MCV: 108.2 FL — ABNORMAL HIGH (ref 79.6–97.8)
MONOCYTES: 12 % (ref 4.0–12.0)
MPV: 9.1 FL — ABNORMAL LOW (ref 9.4–12.3)
NEUTROPHILS: 34 % — ABNORMAL LOW (ref 43–78)
PLATELET COMMENTS: ADEQUATE
PLATELET: 360 10*3/uL (ref 150–450)
RBC: 2.33 M/uL — ABNORMAL LOW (ref 4.05–5.2)
RDW: 15.9 % — ABNORMAL HIGH (ref 11.9–14.6)
WBC: 5.4 10*3/uL (ref 4.3–11.1)

## 2020-11-21 LAB — POC URINE MACROSCOPIC
BILIRUBIN, BILUPC: NEGATIVE
Bilirubin (POC): NEGATIVE
GLUCOSE, GLUUPC: NEGATIVE mg/dL
Glucose, urine (POC): NEGATIVE mg/dL
Ketones (POC): NEGATIVE mg/dL
Ketones: NEGATIVE mg/dL
LEUKOCYTE ESTERASE, LEUKPC: NEGATIVE
Leukocyte esterase (POC): NEGATIVE
NITRITE, NITUPC: NEGATIVE
Nitrite (POC): NEGATIVE
PH, PHUPOC: 6 (ref 5.0–9.0)
SPECIFIC GRAVITY, SGUPOC: 1.015 (ref 1.001–1.023)
Spec. gravity (POC): 1.015 (ref 1.001–1.023)
Urobilinogen (POC): 1 EU/dL (ref 0.2–1.0)
Urobilinogen, POC: 1 EU/dL (ref 0.2–1.0)
pH, urine  (POC): 6 (ref 5.0–9.0)

## 2020-11-21 LAB — URINE MICROSCOPIC

## 2020-11-21 LAB — MAGNESIUM
Magnesium: 2.3 mg/dL (ref 1.8–2.4)
Magnesium: 2.3 mg/dL (ref 1.8–2.4)

## 2020-11-21 LAB — RETICULOCYTE COUNT
Absolute Retic Cnt.: 0.1095 M/ul — ABNORMAL HIGH (ref 0.026–0.095)
Absolute Retic Cnt.: 0.1095 M/ul — ABNORMAL HIGH (ref 0.026–0.095)
Immature Retic Fraction: 22.8 % — ABNORMAL HIGH (ref 3.0–15.9)
Immature Retic Fraction: 22.8 % — ABNORMAL HIGH (ref 3.0–15.9)
Retic Ct Pct: 5.9 % — ABNORMAL HIGH (ref 0.3–2.0)
Retic Hgb Conc.: 34 pg (ref 29–35)
Retic Hgb Conc.: 34 pg (ref 29–35)
Reticulocyte count: 5.9 % — ABNORMAL HIGH (ref 0.3–2.0)

## 2020-11-21 LAB — COVID-19 RAPID TEST: COVID-19 rapid test: NOT DETECTED

## 2020-11-21 LAB — INFLUENZA A & B AG (RAPID TEST)
Influenza A Ag: NEGATIVE
Influenza B Ag: NEGATIVE

## 2020-11-21 LAB — EKG 12-LEAD
Atrial Rate: 74 {beats}/min
P Axis: 65 degrees
P-R Interval: 145 ms
Q-T Interval: 391 ms
QRS Duration: 83 ms
QTc Calculation (Bazett): 431 ms
R Axis: 52 degrees
T Axis: 38 degrees
Ventricular Rate: 73 {beats}/min

## 2020-11-21 LAB — CBC WITH AUTO DIFFERENTIAL
Basophils %: 1 % (ref 0.0–2.0)
Basophils Absolute: 0.1 10*3/uL (ref 0.0–0.2)
Eosinophils %: 1 % (ref 0.5–7.8)
Eosinophils Absolute: 0.1 10*3/uL (ref 0.0–0.8)
Granulocyte Absolute Count: 0.1 10*3/uL (ref 0.0–0.5)
Hematocrit: 25.2 % — ABNORMAL LOW (ref 35.8–46.3)
Hemoglobin: 9 g/dL — ABNORMAL LOW (ref 11.7–15.4)
Immature Granulocytes: 1 % (ref 0.0–5.0)
Lymphocytes %: 51 % — ABNORMAL HIGH (ref 13–44)
Lymphocytes Absolute: 2.7 10*3/uL (ref 0.5–4.6)
MCH: 38.6 PG — ABNORMAL HIGH (ref 26.1–32.9)
MCHC: 35.7 g/dL — ABNORMAL HIGH (ref 31.4–35.0)
MCV: 108.2 FL — ABNORMAL HIGH (ref 79.6–97.8)
MPV: 9.1 FL — ABNORMAL LOW (ref 9.4–12.3)
Monocytes %: 12 % (ref 4.0–12.0)
Monocytes Absolute: 0.6 10*3/uL (ref 0.1–1.3)
NRBC Absolute: 0.04 10*3/uL (ref 0.0–0.2)
Neutrophils %: 34 % — ABNORMAL LOW (ref 43–78)
Neutrophils Absolute: 1.8 10*3/uL (ref 1.7–8.2)
Platelet Comment: ADEQUATE
Platelets: 360 10*3/uL (ref 150–450)
RBC: 2.33 M/uL — ABNORMAL LOW (ref 4.05–5.2)
RDW: 15.9 % — ABNORMAL HIGH (ref 11.9–14.6)
WBC: 5.4 10*3/uL (ref 4.3–11.1)

## 2020-11-21 LAB — COMPREHENSIVE METABOLIC PANEL
ALT: 20 U/L (ref 12–65)
AST: 30 U/L (ref 15–37)
Albumin/Globulin Ratio: 0.9 — ABNORMAL LOW (ref 1.2–3.5)
Albumin: 3.9 g/dL (ref 3.5–5.0)
Alkaline Phosphatase: 126 U/L (ref 50–136)
Anion Gap: 7 mmol/L (ref 7–16)
BUN: 8 MG/DL (ref 6–23)
CO2: 22 mmol/L (ref 21–32)
Calcium: 9.1 MG/DL (ref 8.3–10.4)
Chloride: 113 mmol/L — ABNORMAL HIGH (ref 98–107)
Creatinine: 1.06 MG/DL — ABNORMAL HIGH (ref 0.6–1.0)
EGFR IF NonAfrican American: 59 mL/min/{1.73_m2} — ABNORMAL LOW (ref >60)
GFR African American: >60 mL/min/{1.73_m2} (ref >60)
Globulin: 4.5 g/dL — ABNORMAL HIGH (ref 2.3–3.5)
Glucose: 102 mg/dL — ABNORMAL HIGH (ref 65–100)
Potassium: 4.3 mmol/L (ref 3.5–5.1)
Sodium: 142 mmol/L (ref 136–145)
Total Bilirubin: 0.9 MG/DL (ref 0.2–1.1)
Total Protein: 8.4 g/dL — ABNORMAL HIGH (ref 6.3–8.2)

## 2020-11-21 LAB — RAPID INFLUENZA A/B ANTIGENS
Influenza A Ag: NEGATIVE
Influenza B Ag: NEGATIVE

## 2020-11-21 LAB — COVID-19, RAPID: SARS-CoV-2, Rapid: NOT DETECTED

## 2020-11-21 MED ORDER — PREDNISONE 10 MG TABLETS IN A DOSE PACK
10 mg | ORAL_TABLET | ORAL | 0 refills | Status: AC
Start: 2020-11-21 — End: ?

## 2020-11-21 MED ORDER — SODIUM CHLORIDE 0.9 % IJ SYRG
Freq: Three times a day (TID) | INTRAMUSCULAR | Status: DC
Start: 2020-11-21 — End: 2020-11-24
  Administered 2020-11-21 – 2020-11-24 (×11): via INTRAVENOUS

## 2020-11-21 MED ORDER — HYDROCODONE-HOMATROPINE 5 MG-1.5 MG/5 ML (5 ML) ORAL SOLUTION
ORAL | Status: DC | PRN
Start: 2020-11-21 — End: 2020-11-24
  Administered 2020-11-21 – 2020-11-24 (×2): via ORAL

## 2020-11-21 MED ORDER — LEVOFLOXACIN 500 MG TAB
500 mg | Freq: Every day | ORAL | Status: DC
Start: 2020-11-21 — End: 2020-11-24
  Administered 2020-11-21 – 2020-11-24 (×4): via ORAL

## 2020-11-21 MED ORDER — SENNOSIDES-DOCUSATE SODIUM 8.6 MG-50 MG TAB
Freq: Two times a day (BID) | ORAL | Status: DC | PRN
Start: 2020-11-21 — End: 2020-11-24

## 2020-11-21 MED ORDER — HYDROXYUREA 500 MG CAPSULE
500 mg | Freq: Every day | ORAL | Status: DC
Start: 2020-11-21 — End: 2020-11-24
  Administered 2020-11-21 – 2020-11-24 (×4): via ORAL

## 2020-11-21 MED ORDER — LEVOFLOXACIN 500 MG TAB
500 mg | Freq: Every day | ORAL | Status: DC
Start: 2020-11-21 — End: 2020-11-21

## 2020-11-21 MED ORDER — CYANOCOBALAMIN 1,000 MCG TAB
1000 mcg | Freq: Every day | ORAL | Status: DC
Start: 2020-11-21 — End: 2020-11-24
  Administered 2020-11-21 – 2020-11-24 (×4): via ORAL

## 2020-11-21 MED ORDER — CEFDINIR 300 MG CAP
300 mg | ORAL_CAPSULE | Freq: Two times a day (BID) | ORAL | 0 refills | Status: AC
Start: 2020-11-21 — End: ?

## 2020-11-21 MED ORDER — HYDROMORPHONE 1 MG/ML INJECTION SOLUTION
1 mg/mL | INTRAMUSCULAR | Status: DC | PRN
Start: 2020-11-21 — End: 2020-11-24
  Administered 2020-11-21 – 2020-11-24 (×10): via INTRAVENOUS

## 2020-11-21 MED ORDER — HYDROCODONE-ACETAMINOPHEN 5 MG-325 MG TAB
5-325 mg | Freq: Four times a day (QID) | ORAL | Status: DC | PRN
Start: 2020-11-21 — End: 2020-11-21

## 2020-11-21 MED ORDER — HYDROMORPHONE 1 MG/ML INJECTION SOLUTION
1 mg/mL | INTRAMUSCULAR | Status: DC | PRN
Start: 2020-11-21 — End: 2020-11-21
  Administered 2020-11-21: 10:00:00 via INTRAVENOUS

## 2020-11-21 MED ORDER — ACETAMINOPHEN 500 MG TAB
500 mg | ORAL | Status: AC
Start: 2020-11-21 — End: 2020-11-21
  Administered 2020-11-21: 07:00:00 via ORAL

## 2020-11-21 MED ORDER — HYDROCODONE-ACETAMINOPHEN 5 MG-325 MG TAB
5-325 mg | ORAL | Status: DC | PRN
Start: 2020-11-21 — End: 2020-11-24
  Administered 2020-11-21 – 2020-11-24 (×5): via ORAL

## 2020-11-21 MED ORDER — IOPAMIDOL 76 % IV SOLN
76 % | Freq: Once | INTRAVENOUS | Status: AC
Start: 2020-11-21 — End: 2020-11-21
  Administered 2020-11-21: 07:00:00 via INTRAVENOUS

## 2020-11-21 MED ORDER — ACETAMINOPHEN 325 MG TABLET
325 mg | ORAL | Status: DC | PRN
Start: 2020-11-21 — End: 2020-11-24

## 2020-11-21 MED ORDER — ALBUTEROL SULFATE 0.083 % (0.83 MG/ML) SOLN FOR INHALATION
2.5 mg /3 mL (0.083 %) | RESPIRATORY_TRACT | Status: DC | PRN
Start: 2020-11-21 — End: 2020-11-24
  Administered 2020-11-21: 16:00:00 via RESPIRATORY_TRACT

## 2020-11-21 MED ORDER — AZITHROMYCIN 250 MG TAB
250 mg | ORAL_TABLET | ORAL | 0 refills | Status: AC
Start: 2020-11-21 — End: 2020-11-26

## 2020-11-21 MED ORDER — DIPHENHYDRAMINE HCL 50 MG/ML IJ SOLN
50 mg/mL | INTRAMUSCULAR | Status: AC
Start: 2020-11-21 — End: 2020-11-21

## 2020-11-21 MED ORDER — VIAL-MATE ADAPTOR
500 mg | Status: DC
Start: 2020-11-21 — End: 2020-11-21

## 2020-11-21 MED ORDER — SODIUM CHLORIDE 0.9 % IV
INTRAVENOUS | Status: DC
Start: 2020-11-21 — End: 2020-11-24
  Administered 2020-11-21 – 2020-11-24 (×3): via INTRAVENOUS

## 2020-11-21 MED ORDER — NALOXONE 0.4 MG/ML INJECTION
0.4 mg/mL | INTRAMUSCULAR | Status: DC | PRN
Start: 2020-11-21 — End: 2020-11-24

## 2020-11-21 MED ORDER — DEXTROMETHORPHAN POLY COMPLEX SR 30 MG/5 ML ORAL 12 HR SUSP
30 mg/5 mL | Freq: Two times a day (BID) | ORAL | 0 refills | Status: AC
Start: 2020-11-21 — End: 2020-12-01

## 2020-11-21 MED ORDER — FOLIC ACID 1 MG TAB
1 mg | Freq: Every day | ORAL | Status: DC
Start: 2020-11-21 — End: 2020-11-24
  Administered 2020-11-21 – 2020-11-24 (×4): via ORAL

## 2020-11-21 MED ORDER — DIPHENHYDRAMINE HCL 50 MG/ML IJ SOLN
50 mg/mL | INTRAMUSCULAR | Status: AC
Start: 2020-11-21 — End: 2020-11-21
  Administered 2020-11-21: 09:00:00 via INTRAVENOUS

## 2020-11-21 MED ORDER — SODIUM CHLORIDE 0.9 % IJ SYRG
INTRAMUSCULAR | Status: DC | PRN
Start: 2020-11-21 — End: 2020-11-24

## 2020-11-21 MED ORDER — RIVAROXABAN 10 MG TAB
10 mg | Freq: Every day | ORAL | Status: DC
Start: 2020-11-21 — End: 2020-11-24
  Administered 2020-11-21 – 2020-11-24 (×4): via ORAL

## 2020-11-21 MED ORDER — CEFTRIAXONE 1 GRAM SOLUTION FOR INJECTION
1 gram | INTRAMUSCULAR | Status: AC
Start: 2020-11-21 — End: 2020-11-21
  Administered 2020-11-21: 09:00:00 via INTRAVENOUS

## 2020-11-21 MED ORDER — LEVETIRACETAM 500 MG TAB
500 mg | Freq: Two times a day (BID) | ORAL | Status: DC
Start: 2020-11-21 — End: 2020-11-24
  Administered 2020-11-21 – 2020-11-24 (×7): via ORAL

## 2020-11-21 MED ORDER — SODIUM CHLORIDE 0.9 % INJECTION
25 mg/mL | Freq: Four times a day (QID) | INTRAMUSCULAR | Status: DC | PRN
Start: 2020-11-21 — End: 2020-11-24

## 2020-11-21 MED ORDER — LORAZEPAM 0.5 MG TAB
0.5 mg | Freq: Four times a day (QID) | ORAL | Status: DC | PRN
Start: 2020-11-21 — End: 2020-11-24

## 2020-11-21 MED ORDER — ONDANSETRON (PF) 4 MG/2 ML INJECTION
4 mg/2 mL | INTRAMUSCULAR | Status: DC | PRN
Start: 2020-11-21 — End: 2020-11-24
  Administered 2020-11-21: 10:00:00 via INTRAVENOUS

## 2020-11-21 MED ORDER — FAMOTIDINE 20 MG TAB
20 mg | Freq: Two times a day (BID) | ORAL | Status: DC
Start: 2020-11-21 — End: 2020-11-24
  Administered 2020-11-21 – 2020-11-24 (×7): via ORAL

## 2020-11-21 MED ORDER — FLUOXETINE 20 MG CAP
20 mg | Freq: Every day | ORAL | Status: DC
Start: 2020-11-21 — End: 2020-11-24
  Administered 2020-11-21 – 2020-11-24 (×4): via ORAL

## 2020-11-21 MED FILL — HYCODAN 5 MG-1.5 MG/5 ML (5 ML) ORAL SYRUP: ORAL | Qty: 5

## 2020-11-21 MED FILL — FOLIC ACID 1 MG TAB: 1 mg | ORAL | Qty: 1

## 2020-11-21 MED FILL — HYDROMORPHONE (PF) 1 MG/ML IJ SOLN: 1 mg/mL | INTRAMUSCULAR | Qty: 1

## 2020-11-21 MED FILL — FAMOTIDINE 20 MG TAB: 20 mg | ORAL | Qty: 1

## 2020-11-21 MED FILL — XARELTO 20 MG TABLET: 20 mg | ORAL | Qty: 1

## 2020-11-21 MED FILL — LEVETIRACETAM 500 MG TAB: 500 mg | ORAL | Qty: 2

## 2020-11-21 MED FILL — DIPHENHYDRAMINE HCL 50 MG/ML IJ SOLN: 50 mg/mL | INTRAMUSCULAR | Qty: 1

## 2020-11-21 MED FILL — HYDROXYUREA 500 MG CAPSULE: 500 mg | ORAL | Qty: 2

## 2020-11-21 MED FILL — CEFTRIAXONE 1 GRAM SOLUTION FOR INJECTION: 1 gram | INTRAMUSCULAR | Qty: 1

## 2020-11-21 MED FILL — FLUOXETINE 20 MG CAP: 20 mg | ORAL | Qty: 2

## 2020-11-21 MED FILL — LEVOFLOXACIN 250 MG TAB: 250 mg | ORAL | Qty: 1

## 2020-11-21 MED FILL — SODIUM CHLORIDE 0.9 % IV: INTRAVENOUS | Qty: 1000

## 2020-11-21 MED FILL — ONDANSETRON (PF) 4 MG/2 ML INJECTION: 4 mg/2 mL | INTRAMUSCULAR | Qty: 2

## 2020-11-21 MED FILL — STOOL SOFTENER-STIMULANT LAXATIVE 8.6 MG-50 MG TABLET: ORAL | Qty: 2

## 2020-11-21 MED FILL — HYDROCODONE-ACETAMINOPHEN 5 MG-325 MG TAB: 5-325 mg | ORAL | Qty: 1

## 2020-11-21 MED FILL — ALBUTEROL SULFATE 0.083 % (0.83 MG/ML) SOLN FOR INHALATION: 2.5 mg /3 mL (0.083 %) | RESPIRATORY_TRACT | Qty: 1

## 2020-11-21 MED FILL — TYLENOL EXTRA STRENGTH 500 MG TABLET: 500 mg | ORAL | Qty: 2

## 2020-11-21 MED FILL — CYANOCOBALAMIN 1,000 MCG TAB: 1000 mcg | ORAL | Qty: 0.5

## 2020-11-21 NOTE — Progress Notes (Signed)
Problem: Risk for Spread of Infection  Goal: Prevent transmission of infectious organism to others  Description: Prevent the transmission of infectious organisms to other patients, staff members, and visitors.  11/22/2020 0157 by Kristopher Oppenheim, RN  Outcome: Progressing Towards Goal  11/22/2020 0157 by Kristopher Oppenheim, RN  Outcome: Progressing Towards Goal     Problem: Patient Education:  Go to Education Activity  Goal: Patient/Family Education  11/22/2020 0157 by Kristopher Oppenheim, RN  Outcome: Progressing Towards Goal  11/22/2020 0157 by Kristopher Oppenheim, RN  Outcome: Progressing Towards Goal     Problem: Falls - Risk of  Goal: *Absence of Falls  Description: Document Bridgette Habermann Fall Risk and appropriate interventions in the flowsheet.  11/22/2020 0157 by Kristopher Oppenheim, RN  Outcome: Progressing Towards Goal  Note: Fall Risk Interventions:                      History of Falls Interventions: Investigate reason for fall      11/22/2020 0157 by Kristopher Oppenheim, RN  Outcome: Progressing Towards Goal  Note: Fall Risk Interventions:                      History of Falls Interventions: Investigate reason for fall         Problem: Patient Education: Go to Patient Education Activity  Goal: Patient/Family Education  11/22/2020 0157 by Kristopher Oppenheim, RN  Outcome: Progressing Towards Goal  11/22/2020 0157 by Kristopher Oppenheim, RN  Outcome: Progressing Towards Goal

## 2020-11-21 NOTE — Consults (Signed)
Consults by  Lurene Shadow, MD at 11/21/20 1130                Author: Lurene Shadow, MD  Service: Hematology and Oncology  Author Type: Physician       Filed: 11/21/20 1454  Date of Service: 11/21/20 1130  Status: Signed          Editor: Lurene Shadow, MD (Physician)            Consult Orders        1. IP CONSULT TO HEMATOLOGY [[id number]] ordered by Elberta Spaniel, MD at 11/21/20 [facility]: Inpatient Hematology / Oncology Consult Note      Reason for Consult:     Referring Physician:  [place] Livings, Jacqulyn Bath, MD      History of Present Illness:   Ms. Grayer is a  44 y.o. female admitted on 11/20/2020  with a primary diagnosis of PNA and SCD.        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  She p/w cough and rib pain since Mon.  Mother ill with cough and in contact with pt.  Not vaccinated against COVID  -stated that she had a severe rxn to influenza vaccine in the past.  No fevers at home or in ED.  + productive cough x 2 days.  + nausea/lightheadedness.  COVID neg.  Flu neg.  ~3 yearly admissions due to SCD crisis.  Has been on hydroxyurea since ~44yo.   Has FU with PCP and not heme/onc in town recently.  Has seen Dr [name] and Ronny Flurry in 03/2020.  Was recommended to c/w Crizanlizumab.  Of note, she is planning to move to [place] before the end of the year.  She's been on hydroxyurea 1059m daily, FA 115mday and  vit D.  CT in ED w/out PE but patchy infiltrate in RML.  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  Started in ceftriaxone in ED but started itching and [facility]  Currently appears comfortable.  No  acute distress resting in bed.        Review of Systems:      Constitutional  Denies fever or chills.  Denies weight loss or appetite changes. +fatigue. Denies night sweats.        HEENT  Denies trauma,  blurry vision, hearing loss, ear pain, nosebleeds, sore throat, neck pain and ear discharge.         Skin  Denies lesions or rashes.     Lungs  +dyspnea, cough, sputum production, and no hemoptysis.  Rib pain      Cardiovascular  Denies palpitations, or lower extremity edema.     Gastrointestinal  Denies nausea or vomiting. Denies changes in bowel habits.  Denies bloody or black stools. Denies abdominal pain.     GU  Denies dysuria, frequency or hesitancy of urination.     Neuro  Denies visual changes or ataxia. Denies dizziness, tingling, tremors, sensory change, speech change, focal weakness.  Hx of sz d/0      Hematology  Denies bleeding     Endo  Denies heat/cold intolerance     MSK  Pain in arms and legs  is typical for her hx of SCD with crisis         Psychiatric/Behavioral  The patient is not nervous/anxious.                Allergies        Allergen  Reactions         ?  Latex, Natural Rubber  Rash     ?  Demerol [Meperidine]  Seizures     ?  Fentanyl  Other (comments)             "my doctor said I had a stroke from it"         ?  Flu Vaccine 2011 (36 Mos+)(Pf)  Swelling     ?  Morphine  Itching         ?  Oxycontin [Oxycodone]  Vertigo          Past Medical History:        Diagnosis  Date         ?  Seizures ([place])       ?  Sickle cell disease ([place])           ?  Stroke [facility])          No past surgical history on file.   No family history on file.     Social History          Socioeconomic History         ?  Marital status:  SINGLE              Spouse name:  Not on file         ?  Number of children:  Not on file     ?  Years of education:  Not on file     ?  Highest education level:  Not on file       Occupational History        ?  Not on file       Tobacco Use         ?  Smoking status:  Never Smoker     ?  Smokeless tobacco:  Never Used       Substance and Sexual Activity         ?  Alcohol use:  Never     ?  Drug use:  Never     ?  Sexual activity:  Not on file        Other Topics  Concern        ?  Not on  file       Social History Narrative        ?  Not on file          Social Determinants of Health          Financial Resource Strain:         ?  Difficulty of Paying Living Expenses: Not on file       Food Insecurity:         ?  Worried About Running Out of Food in the Last Year: Not on file     ?  Ran Out of Food in the Last Year: Not on file       Transportation Needs:         ?  Lack of Transportation (Medical): Not on file     ?  Lack of Transportation (Non-Medical): Not on file  Physical Activity:         ?  Days of Exercise per Week: Not on file     ?  Minutes of Exercise per Session: Not on file       Stress:         ?  Feeling of Stress : Not on file       Social Connections:         ?  Frequency of Communication with Friends and Family: Not on file     ?  Frequency of Social Gatherings with Friends and Family: Not on file     ?  Attends Religious Services: Not on file     ?  Active Member of Clubs or Organizations: Not on file     ?  Attends Archivist Meetings: Not on file     ?  Marital Status: Not on file       Intimate Partner Violence:         ?  Fear of Current or Ex-Partner: Not on file     ?  Emotionally Abused: Not on file     ?  Physically Abused: Not on file     ?  Sexually Abused: Not on file       Housing Stability:         ?  Unable to Pay for Housing in the Last Year: Not on file     ?  Number of Places Lived in the Last Year: Not on file        ?  Unstable Housing in the Last Year: Not on file          Current Facility-Administered Medications             Medication  Dose  Route  Frequency  Provider  Last Rate  Last Admin              ?  HYDROcodone-homatropine (HYCODAN) 5-1.5 mg/5 mL (5 mL) oral solution 5 mL   5 mL  Oral  Q4H PRN  Leane Call, MD     5 mL at 11/21/20 0407     ?  sodium chloride (NS) flush 5-40 mL   5-40 mL  IntraVENous  Q8H  Elberta Spaniel, MD     10 mL at 11/21/20 0825     ?  sodium chloride (NS) flush 5-40 mL   5-40 mL  IntraVENous  PRN  Elberta Spaniel,  MD           ?  acetaminophen (TYLENOL) tablet 650 mg   650 mg  Oral  Q4H PRN  Elberta Spaniel, MD           ?  HYDROcodone-acetaminophen [facility]) 5-325 mg per tablet 1 Tablet   1 Tablet  Oral  Q4H PRN  Elberta Spaniel, MD     1 Tablet at 11/21/20 1313     ?  naloxone [facility]) injection 0.4 mg   0.4 mg  IntraVENous  PRN  Elberta Spaniel, MD           ?  albuterol (PROVENTIL VENTOLIN) nebulizer solution 2.5 mg   2.5 mg  Nebulization  Q4H PRN  Elberta Spaniel, MD     2.5 mg at 11/21/20 1118     ?  LORazepam (ATIVAN) tablet 0.5-1 mg   0.5-1 mg  Oral  Q6H PRN  Elberta Spaniel, MD           ?  senna-docusate (PERICOLACE) 8.6-50  mg per tablet 2 Tablet   2 Tablet  Oral  BID PRN  Elberta Spaniel, MD           ?  levoFLOXacin [facility]) tablet 750 mg   750 mg  Oral  DAILY  Elberta Spaniel, MD     750 mg at 11/21/20 0442     ?  ondansetron (ZOFRAN) injection 4 mg   4 mg  IntraVENous  Q4H PRN  Elberta Spaniel, MD     4 mg at 11/21/20 0442     ?  promethazine (PHENERGAN) with saline injection 12.5 mg   12.5 mg  IntraVENous  Q6H PRN  Elberta Spaniel, MD           ?  0.9% sodium chloride infusion   100 mL/hr  IntraVENous  CONTINUOUS  Elberta Spaniel, MD     IV Completed at 11/21/20 1428     ?  cyanocobalamin tablet 500 mcg   500 mcg  Oral  DAILY  Elberta Spaniel, MD     500 mcg at 11/21/20 8295     ?  FLUoxetine (PROzac) capsule 40 mg   40 mg  Oral  DAILY  Elberta Spaniel, MD     40 mg at 11/21/20 6213     ?  folic acid (FOLVITE) tablet 1 mg   1 mg  Oral  DAILY  Elberta Spaniel, MD     1 mg at 11/21/20 0865     ?  hydroxyurea (HYDREA) chemo cap 1,000 mg   1,000 mg  Oral  DAILY  Elberta Spaniel, MD     1,000 mg at 11/21/20 1106     ?  levETIRAcetam (KEPPRA) tablet 1,000 mg   1,000 mg  Oral  BID  Elberta Spaniel, MD     1,000 mg at 11/21/20 7846     ?  rivaroxaban (XARELTO) tablet 20 mg   20 mg  Oral  DAILY WITH Hetty Ely, MD     20 mg at 11/21/20 9629     ?  famotidine (PEPCID) tablet 20 mg   20 mg  Oral  BID  Elberta Spaniel, MD      20 mg at 11/21/20 5284     ?  HYDROmorphone (DILAUDID) injection 0.5 mg   0.5 mg  IntraVENous  Q3H PRN  Elberta Spaniel, MD     0.5 mg at 11/21/20 0820              ?  HYDROcodone-acetaminophen (NORCO) 5-325 mg per tablet 1 Tablet   1 Tablet  Oral  Q6H PRN  Elberta Spaniel, MD                Current Outpatient Medications          Medication  Sig  Dispense  Refill           ?  azithromycin (Zithromax Z-Pak) 250 mg tablet  2 pills today 1 pill every day for 4 days.  6 Tablet  0     ?  cefdinir (OMNICEF) 300 mg capsule  Take 1 Capsule by mouth two (2) times a day.  20 Capsule  0     ?  dextromethorphan (Delsym) 30 mg/5 mL liquid  Take 10 mL by mouth two (2) times a day for 10 days.  200 mL  0     ?  predniSONE (STERAPRED DS) 10 mg dose pack  Please take as directed on package. Please clarify any questions with  the Pharmacist.  21 Tablet  0     ?  cyanocobalamin (Vitamin B-12) 100 mcg tablet  Take 100 mcg by mouth daily.               ?  rivaroxaban (Xarelto) 20 mg tab tablet  Take 20 mg by mouth daily (with breakfast).               ?  diphenhydrAMINE-acetaminophen 25-500 mg tab  Take 1 Tab by mouth daily.         ?  cetirizine (ZYRTEC) 10 mg tablet  Take  by mouth.         ?  diphenhydrAMINE (BENADRYL) 25 mg tablet  Take 25 mg by mouth.         ?  FLUoxetine (PROzac) 20 mg capsule  Take 40 mg by mouth daily.         ?  pantoprazole (PROTONIX) 40 mg tablet  Take 1 Tab by mouth daily.  30 Tab  0     ?  levETIRAcetam (KEPPRA) 750 mg tablet  Take 1,000 mg by mouth two (2) times a day.         ?  hydroxyurea (HYDREA) 500 mg capsule  Take 1,000 mg by mouth daily.               ?  folic acid (FOLVITE) 1 mg tablet  Take 1 mg by mouth daily.               OBJECTIVE:   Patient Vitals for the past 8 hrs:           BP  Pulse  Resp  SpO2           11/21/20 1345  107/72  79  12  --           11/21/20 1331  110/80  81  15  92 %     11/21/20 1314  98/66  86  19  92 %     11/21/20 1244  (!) 90/59  78  --  (!) 89 %     11/21/20 1201   111/70  82  17  91 %     11/21/20 1145  127/80  --  --  --     11/21/20 1130  126/80  72  9  98 %     11/21/20 1118  --  --  --  93 %     11/21/20 1115  113/68  74  --  96 %     11/21/20 1045  103/68  64  --  90 %     11/21/20 1023  99/74  --  --  --     11/21/20 0953  107/73  78  --  93 %     11/21/20 0853  (!) 104/57  73  --  93 %     11/21/20 0823  106/60  81  --  93 %     11/21/20 0753  104/74  69  --  90 %           11/21/20 0745  101/64  77  11  92 %        Temp (24hrs), Avg:99.6 ??F (37.6 ??C), Min:99.6 ??F (37.6 ??C), Max:99.6 ??F (37.6 ??C)      12/19 0701 - 12/19 1900   In: 968.3 [I.V.:968.3]   Out: -       Physical Exam:  Constitutional:  ECOG - 1   Well developed, well nourished female in no acute distress, sitting comfortably in the hospital bed.         HEENT:  Normocephalic and atraumatic. Oropharynx is clear, mucous membranes are moist.  Pupils are equal, round, and reactive to light. Extraocular muscles are intact.   Sclerae anicteric. Neck supple      Lymph node     No palpable submandibular, cervical, supraclavicular lymph nodes.     Skin  Warm and dry.  No bruising and no rash noted.  No erythema.  No pallor.      Respiratory  Lungs w scattered rhonchi on R, no resp distress, no cough on deep inspiration      CVS  Normal rate, regular rhythm and normal S1 and S2.      Abdomen  Soft, nontender and nondistended, normoactive bowel sounds.  No palpable mass.     Neuro  Grossly nonfocal with no obvious sensory or motor deficits.     MSK  Normal range of motion in general.  No edema and no tenderness.     Psych  Appropriate mood and affect.         Labs:       Recent Results (from the past 24 hour(s))     EKG, 12 LEAD, INITIAL          Collection Time: 11/21/20 12:20 AM         Result  Value  Ref Range            Ventricular Rate  73  BPM       Atrial Rate  74  BPM       P-R Interval  145  ms       QRS Duration  83  ms       Q-T Interval  391  ms       QTC Calculation (Bezet)  431  ms       Calculated P  Axis  65  degrees       Calculated R Axis  52  degrees       Calculated T Axis  38  degrees       Diagnosis                 Sinus rhythm   Abnormal R-wave progression, early transition      Confirmed by SENFIELD, JEFFERY ([id number]) on 11/21/2020 10:35:56 AM          INFLUENZA A & B AG (RAPID TEST)          Collection Time: 11/21/20 12:33 AM         Result  Value  Ref Range            Influenza A Ag  Negative  NEG         Influenza B Ag  Negative  NEG         Source  Nasopharyngeal          COVID-19 RAPID TEST          Collection Time: 11/21/20 12:33 AM         Result  Value  Ref Range            Specimen source  Nasopharyngeal          COVID-19 rapid test  Not detected  NOTD         CBC WITH AUTOMATED DIFF  Collection Time: 11/21/20  1:17 AM         Result  Value  Ref Range            WBC  5.4  4.3 - 11.1 K/uL       RBC  2.33 (L)  4.05 - 5.2 M/uL       HGB  9.0 (L)  11.7 - 15.4 g/dL       HCT  25.2 (L)  35.8 - 46.3 %       MCV  108.2 (H)  79.6 - 97.8 FL       MCH  38.6 (H)  26.1 - 32.9 PG       MCHC  35.7 (H)  31.4 - 35.0 g/dL       RDW  15.9 (H)  11.9 - 14.6 %       PLATELET  360  150 - 450 K/uL       MPV  9.1 (L)  9.4 - 12.3 FL       ABSOLUTE NRBC  0.04  0.0 - 0.2 K/uL       NEUTROPHILS  34 (L)  43 - 78 %       LYMPHOCYTES  51 (H)  13 - 44 %       MONOCYTES  12  4.0 - 12.0 %       EOSINOPHILS  1  0.5 - 7.8 %       BASOPHILS  1  0.0 - 2.0 %       IMMATURE GRANULOCYTES  1  0.0 - 5.0 %       ABS. NEUTROPHILS  1.8  1.7 - 8.2 K/UL       ABS. LYMPHOCYTES  2.7  0.5 - 4.6 K/UL       ABS. MONOCYTES  0.6  0.1 - 1.3 K/UL       ABS. EOSINOPHILS  0.1  0.0 - 0.8 K/UL       ABS. BASOPHILS  0.1  0.0 - 0.2 K/UL       ABS. IMM. GRANS.  0.1  0.0 - 0.5 K/UL       RBC COMMENTS  SLIGHT   ANISOCYTOSIS + POIKILOCYTOSIS             RBC COMMENTS  OCCASIONAL   TARGET CELLS             RBC COMMENTS  SLIGHT   MACROCYTOSIS             RBC COMMENTS  SLIGHT   POLYCHROMASIA             WBC COMMENTS  Result Confirmed By Smear          PLATELET  COMMENTS  ADEQUATE          DF  AUTOMATED          METABOLIC PANEL, COMPREHENSIVE          Collection Time: 11/21/20  1:17 AM         Result  Value  Ref Range            Sodium  142  136 - 145 mmol/L       Potassium  4.3  3.5 - 5.1 mmol/L       Chloride  113 (H)  98 - 107 mmol/L       CO2  22  21 - 32 mmol/L       Anion gap  7  7 -  16 mmol/L       Glucose  102 (H)  65 - 100 mg/dL       BUN  8  6 - 23 MG/DL       Creatinine  1.06 (H)  0.6 - 1.0 MG/DL       GFR est AA  >60  >60 ml/min/1.6m       GFR est non-AA  59 (L)  >60 ml/min/1.747m      Calcium  9.1  8.3 - 10.4 MG/DL       Bilirubin, total  0.9  0.2 - 1.1 MG/DL       ALT (SGPT)  20  12 - 65 U/L       AST (SGOT)  30  15 - 37 U/L       Alk. phosphatase  126  50 - 136 U/L       Protein, total  8.4 (H)  6.3 - 8.2 g/dL       Albumin  3.9  3.5 - 5.0 g/dL       Globulin  4.5 (H)  2.3 - 3.5 g/dL       A-G Ratio  0.9 (L)  1.2 - 3.5         MAGNESIUM          Collection Time: 11/21/20  1:17 AM         Result  Value  Ref Range            Magnesium  2.3  1.8 - 2.4 mg/dL       RETICULOCYTE COUNT          Collection Time: 11/21/20  1:17 AM         Result  Value  Ref Range            Reticulocyte count  5.9 (H)  0.3 - 2.0 %       Absolute Retic Cnt.  0.1095 (H)  0.026 - 0.095 M/ul       Immature Retic Fraction  22.8 (H)  3.0 - 15.9 %       Retic Hgb Conc.  34  29 - 35 pg       POC URINE MACROSCOPIC          Collection Time: 11/21/20  2:05 AM         Result  Value  Ref Range            Spec. gravity (POC)  1.015  1.001 - 1.023         pH, urine  (POC)  6.0  5.0 - 9.0         Protein (POC)  TRACE (A)  NEG mg/dL       Glucose, urine (POC)  Negative  NEG mg/dL       Ketones (POC)  Negative  NEG mg/dL       Bilirubin (POC)  Negative  NEG         Blood (POC)  Trace Intact (A)  NEG         Urobilinogen (POC)  1.0  0.2 - 1.0 EU/dL       Nitrite (POC)  Negative  NEG         Leukocyte esterase (POC)  Negative  NEG         Performed by  [facility]           Collection Time:  11/21/20  2:10 AM         Result  Value  Ref Range            WBC  0-3  0 /hpf       RBC  0-3  0 /hpf       Epithelial cells  0-3  0 /hpf       Bacteria  TRACE  0 /hpf            Casts  0-3  0 /lpf           Imaging:           11/21/2020  02:11  11/21/2020 ??2:17 AM  [id number] ??2:17 AM             Narrative & Impression       EXAM: CT Chest with IV contrast - PE protocol.   ??   INDICATION: Cough and chills.   ??   COMPARISON: None.   ??   TECHNIQUE: Axial CT images of the chest were obtained after the intravenous   injection of 100 mL Isovue 370 CT contrast. Radiation dose reduction techniques   were used for this study.  Our CT scanners use one or all of the following:    Automated exposure control, adjustment of the mA and/or kV according to patient   size, iterative reconstruction.   ??   FINDINGS:   - Pleura/pericardium: Within normal limits.   - Lungs: There is minimal patchy infiltrate in the right middle lobe, suspect   for early acute pneumonia. The left lung is clear.   - Hila/Mediastinum: Contrast was injected in the right arm. The right   brachiocephalic vein is occluded with thrombus. Collateral vessels are seen   along the anterior and right lateral chest wall.   - Tracheobronchial tree: Within normal limits.   - Aorta/pulmonary arteries: Within normal limits.   - Heart: Within normal limits.   - Coronary arteries: No coronary artery calcifications.   - Chest wall: Within normal limits.   - Spine/bones: No acute process.   - Additional comments: Scans through the upper abdomen demonstrate a calcified   and shrunken spleen, consistent with the reported history of sickle cell   disease.   ??   IMPRESSION   1. No evidence of pulmonary embolism.   2. Minimal patchy infiltrate in the right middle lobe, suspect for early acute   pneumonia.   3. Thrombosed right brachiocephalic vein, with collateral vessels along the   anterior and right lateral chest wall, of indeterminate age.   4. Shrunken and  and calcified spleen, consistent with the reported history of   sickle cell disease.   ??              ASSESSMENT:      Principal Problem:     CAP (community acquired pneumonia) (11/21/2020)      Active Problems:     Sickle cell disease ([place]) (08/15/2017)        Seizure disorder ([place]) (08/16/2017)        Hypercoagulable state ([place]) (08/16/2017)        Sickle cell crisis ([place]) (11/21/2020)            PLAN / RECOMMENDATIONS:   Lab studies and imaging studies were personally reviewed.   Pertinent old records were reviewed .       1. SCD - HbSS - baseline Hb ~9 with crisis    - her regimen includes hydroxyurea  and FA, has been on Crizanlizumab in the past. Lost to FU with [facility] Dr Zada Girt.  Recently at [facility] with  pain crisis in 10/2020.  Moved to [place] and now planning to move to [place].     - Hb relatively stable    - checking Hb F with Hb electrophoresis    - started on IVF/Dilaudid and Norco (pt on acetaminophen as needed outpt/recently at [facility] and advised to cut back - was taking >6/day).        2. CAP    - ill contact - mother; started on ceftriaxone --> switched to LVQ due to rxn, COVID/flu neg    - no fever/O2 sat's adequate, not c/w ACS, no need for exchange transfusion at this time.  Hb also stable at 9.        3. sz d/o - c/w Keppra      4. hypercoag state - hx of DVT/PE & SVC occlusion   - has been on Xarelto and ASA (previously)    - monitor for bleeding       Dr [name] will take over heme/onc service in am.     Thank you for allowing me to participate in the care of Ms.  Hassell Done.              Lurene Shadow, MD   [facility] [place] [address]   Office : [phone]   Fax : [phone]

## 2020-11-21 NOTE — Progress Notes (Signed)
Shevawn Langenberg is a 44 year old female with a PMH of sickle cell disease, asplenia, SVC occlusion, h/o DVT/PE on Xarelto, Seizure Disorder, GERD, Depression/Anxiety who presented with cough and pleuritic chest pain  that started this past Monday. CTPE in ER showing no evidence of PE but with minimal patchy infiltrate in the right middle lobe, suspect for early acute pneumonia, thrombosed right brachiocephalic vein, with collateral vessels along theanterior and right lateral chest wall, of indeterminate age, and shrunken spleen. Patient is on Xarelto. Patient did have adverse reaction to Rocephin and is now on Levaquin. Remains in pain with sickle cell crisis. Will continue plan as outlined in H&P with adjustments prn

## 2020-11-21 NOTE — H&P (Signed)
H&P by Elberta Spaniel,  MD at 11/21/20 [phone]                Author: Elberta Spaniel, MD  Service: Internal Medicine  Author Type: Physician       Filed: 11/21/20 0533  Date of Service: 11/21/20 0513  Status: Signed          Editor: Elberta Spaniel, MD (Physician)                          Hospitalist History and Physical     Admit Date:  11/20/2020 11:18 PM    Name:  [name] Bright    Age:  44 y.o.   Sex:  female   DOB:  1976/11/01    MRN:  [id number]    Room:  ER29/29     Presenting Complaint: Cough      Reason(s) for Admission: CAP (community acquired pneumonia) [J18.9]   Sickle cell crisis ([place]) [D57.00]         History of Present Illness:     [name] Bright is a 44 y.o.  female with medical history of sickle cell disease, asplenia, SVC occlusion, h/o DVT/PE on Xarelto, Seizure Disorder, GERD, Depression/Anxiety  who presented with cough and pain in rib cage that started this past Monday. Patient dates that she was having new cough with green mucus production that worsened about 2 days ago.  She started having significant bilateral pleuritic chest pain, nausea  and lightheadedness.  She denies any fevers, shaking chills, anorexia, palpitations, chest pressure, diaphoresis, near-syncope or syncope.  She states she has had about 3 admissions within the last 1 to 2 years for sickle cell crisis.  She usually has  pain in her back, legs and lower abdomen when she has sickle cell pain.  She initially did not have any sickle cell pain at home over the last 2 days but is significantly suffering from pain in those areas on my evaluation.  She moved from [place] earlier  this year and states that she has not been set up with hematologist in [place] although on chart review it appears she has seen multiple hematologist in the area, most recently at [facility].   In the ED, patient afebrile with stable vital signs.  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  Patient started suffering from new pruritus  as well as increased pain in her back, legs and lower abdomen after receiving IV Rocephin for her CAP. She received some benadryl with improvement in pruritus. Hospitalist was asked to admit for CAP in setting of SSS/asplenia. Pt in significant pain and  distress on my evaluation.       Review of Systems:   10 systems reviewed and negative except as noted in HPI.     Assessment & Plan:        Principal Problem:     #CAP    - afebrile, HDS without signs of sepsis   - CT chest showing RML infiltrate   - COVID and flu negative   - did not tolerate Rocephin, start Levaquin 71m PO daily       #Sickle cell crisis    - start aggressive IVF hydration   - start dilaudid and norco prn    - start zofran prn for nausea   - cont home hydrea, folic acid   -  no ACS at this time as patient afebrile, satting well on RA   - consult hematology in the AM           #Seizure disorder     - cont home Keppra BID      #Hypercoagulable state   #H/o DVT/PE and SVC occlusion     - cont home xarelto daily   - monitor for severe bleeding with frequent epistaxis      #GERD   - start pepcid BID      #Anxiety/Depression   - cont home prozac        Dispo/Discharge Planning:    Likely 1-2 midnight hospital stay pending clinical course.       Diet: No diet orders on file   VTE ppx: xarelto   Code status: No Order         Hospital Problems  as of 11/21/2020  Date Reviewed:  03/04/2020                         Codes  Class  Noted - Resolved  POA              Sickle cell crisis ([place])  ICD-10-CM: D57.00   ICD-9-CM: 282.62    11/21/2020 - Present  Unknown                        * (Principal) CAP (community acquired pneumonia)  ICD-10-CM: J18.9   ICD-9-CM: 696    11/21/2020 - Present  Unknown                        Seizure disorder ([place])  ICD-10-CM: G40.909   ICD-9-CM: 345.90    08/16/2017 - Present  Yes                        Hypercoagulable state  ([place])  ICD-10-CM: E95.28   ICD-9-CM: 289.81    08/16/2017 - Present  Yes                        Sickle cell disease ([place])  ICD-10-CM: D57.1   ICD-9-CM: 282.60    08/15/2017 - Present  Yes                          Past History:     Past Medical History:        Diagnosis  Date         ?  Seizures ([place])       ?  Sickle cell disease ([place])           ?  Stroke [facility])          No past surgical history on file.      Allergies        Allergen  Reactions         ?  Latex, Natural Rubber  Rash     ?  Demerol [Meperidine]  Seizures     ?  Fentanyl  Other (comments)             "my doctor said I had a stroke from it"         ?  Flu Vaccine 2011 (36 Mos+)(Pf)  Swelling     ?  Morphine  Itching         ?  Oxycontin [  Oxycodone]  Vertigo           Social History          Tobacco Use         ?  Smoking status:  Never Smoker     ?  Smokeless tobacco:  Never Used       Substance Use Topics         ?  Alcohol use:  Never         No family history on file.    Family history reviewed and negative except as otherwise noted.         There is no immunization history on file for this patient.   Prior to Admit Medications:     Current Outpatient Medications        Medication  Instructions         ?  azithromycin (Zithromax Z-Pak) 250 mg tablet  2 pills today 1 pill every day for 4 days.     ?  cefdinir (OMNICEF)  300 mg, Oral, 2 TIMES DAILY     ?  cetirizine (ZYRTEC) 10 mg tablet  Oral     ?  cyanocobalamin (VITAMIN B-12)  100 mcg, Oral, DAILY     ?  dextromethorphan (DELSYM)  60 mg, Oral, 2 TIMES DAILY     ?  diphenhydrAMINE (BENADRYL)  25 mg, Oral     ?  diphenhydrAMINE-acetaminophen 25-500 mg tab  1 Tablet, Oral, DAILY     ?  FLUoxetine (PROZAC)  40 mg, Oral, DAILY     ?  folic acid (FOLVITE)  1 mg, Oral, DAILY     ?  hydroxyurea (HYDREA)  1,000 mg, Oral, DAILY     ?  levETIRAcetam (KEPPRA)  1,000 mg, Oral, 2 TIMES DAILY     ?  pantoprazole (PROTONIX)  40 mg, Oral, DAILY     ?  predniSONE (STERAPRED DS) 10 mg dose pack  Please take as directed  on package. Please clarify any questions with the Pharmacist.         ?  rivaroxaban (XARELTO)  20 mg, Oral, DAILY WITH BREAKFAST             Objective:        Patient Vitals for the past 24 hrs:            Temp  Pulse  Resp  BP  SpO2            11/21/20 0119  --  75  20  131/85  --            11/21/20 0015  --  --  --  (!) 115/104  --     11/21/20 0000  --  --  --  105/72  95 %     11/20/20 2335  --  --  --  --  94 %     11/20/20 2334  --  --  --  126/79  --            11/20/20 2322  99.6 ??F (37.6 ??C)  89  18  (!) 133/95  96 %        Oxygen Therapy   O2 Sat (%): 95 % (11/21/20 0000)   Pulse via Oximetry: 76 beats per minute (11/21/20 0000)   O2 Device: None (Room air) (11/20/20 2335)      Estimated body mass index is 26.76 kg/m?? as calculated from the following:     Height as  of this encounter: 5' 8" (1.727 m).     Weight as of this encounter: 79.8 kg (176 lb).   No intake or output data in the 24 hours ending 11/21/20 0513        Physical Exam:   Blood pressure 131/85, pulse 75, temperature 99.6 ??F (37.6 ??C), resp. rate 20, height 5' 8" (1.727 m), weight 79.8 kg (176 lb), SpO2 95 %.   General:    Well nourished.  Moderate distress moaning in pain   Head:  Normocephalic, atraumatic   Eyes:  Sclerae appear normal.  Pupils equally round.   ENT:  Nares appear normal, no drainage.  Moist oral mucosa   Neck:  No restricted ROM.  Trachea midline    CV:   RRR.  No m/r/g.  No jugular venous distension.   Lungs:   Mild R mid crackles.  No wheezing, rhonchi.  Respirations even, unlabored   Abdomen:   Bowel sounds present.  Soft, tender to palpation lower quadrants, nondistended.   Extremities: No cyanosis or clubbing.  No edema. Painful to touch BLLE   Skin:     No rashes and normal coloration.   Warm and dry.     Neuro:  CN II-XII grossly intact.  Sensation intact.  A&Ox3   Psych:  Normal mood and affect      I have reviewed ordered lab tests and independently visualized imaging below:      Last 24hr Labs:     Recent  Results (from the past 24 hour(s))     INFLUENZA A & B AG (RAPID TEST)          Collection Time: 11/21/20 12:33 AM         Result  Value  Ref Range            Influenza A Ag  Negative  NEG         Influenza B Ag  Negative  NEG         Source  Nasopharyngeal          COVID-19 RAPID TEST          Collection Time: 11/21/20 12:33 AM         Result  Value  Ref Range            Specimen source  Nasopharyngeal          COVID-19 rapid test  Not detected  NOTD         CBC WITH AUTOMATED DIFF          Collection Time: 11/21/20  1:17 AM         Result  Value  Ref Range            WBC  5.4  4.3 - 11.1 K/uL       RBC  2.33 (L)  4.05 - 5.2 M/uL       HGB  9.0 (L)  11.7 - 15.4 g/dL       HCT  25.2 (L)  35.8 - 46.3 %            MCV  108.2 (H)  79.6 - 97.8 FL            MCH  38.6 (H)  26.1 - 32.9 PG       MCHC  35.7 (H)  31.4 - 35.0 g/dL       RDW  15.9 (H)  11.9 - 14.6 %       PLATELET  360  150 - 450 K/uL       MPV  9.1 (L)  9.4 - 12.3 FL       ABSOLUTE NRBC  0.04  0.0 - 0.2 K/uL       NEUTROPHILS  34 (L)  43 - 78 %       LYMPHOCYTES  51 (H)  13 - 44 %       MONOCYTES  12  4.0 - 12.0 %       EOSINOPHILS  1  0.5 - 7.8 %       BASOPHILS  1  0.0 - 2.0 %       IMMATURE GRANULOCYTES  1  0.0 - 5.0 %       ABS. NEUTROPHILS  1.8  1.7 - 8.2 K/UL       ABS. LYMPHOCYTES  2.7  0.5 - 4.6 K/UL       ABS. MONOCYTES  0.6  0.1 - 1.3 K/UL       ABS. EOSINOPHILS  0.1  0.0 - 0.8 K/UL       ABS. BASOPHILS  0.1  0.0 - 0.2 K/UL       ABS. IMM. GRANS.  0.1  0.0 - 0.5 K/UL       RBC COMMENTS  SLIGHT   ANISOCYTOSIS + POIKILOCYTOSIS             RBC COMMENTS  OCCASIONAL   TARGET CELLS             RBC COMMENTS  SLIGHT   MACROCYTOSIS             RBC COMMENTS  SLIGHT   POLYCHROMASIA             WBC COMMENTS  Result Confirmed By Smear          PLATELET COMMENTS  ADEQUATE               DF  AUTOMATED          METABOLIC PANEL, COMPREHENSIVE          Collection Time: 11/21/20  1:17 AM         Result  Value  Ref Range            Sodium  142  136 - 145 mmol/L        Potassium  4.3  3.5 - 5.1 mmol/L       Chloride  113 (H)  98 - 107 mmol/L       CO2  22  21 - 32 mmol/L       Anion gap  7  7 - 16 mmol/L       Glucose  102 (H)  65 - 100 mg/dL       BUN  8  6 - 23 MG/DL       Creatinine  1.06 (H)  0.6 - 1.0 MG/DL       GFR est AA  >60  >60 ml/min/1.7m       GFR est non-AA  59 (L)  >60 ml/min/1.749m      Calcium  9.1  8.3 - 10.4 MG/DL       Bilirubin, total  0.9  0.2 - 1.1 MG/DL       ALT (SGPT)  20  12 - 65 U/L       AST (SGOT)  30  15 - 37 U/L       Alk. phosphatase  126  50 -  136 U/L       Protein, total  8.4 (H)  6.3 - 8.2 g/dL       Albumin  3.9  3.5 - 5.0 g/dL       Globulin  4.5 (H)  2.3 - 3.5 g/dL       A-G Ratio  0.9 (L)  1.2 - 3.5         MAGNESIUM          Collection Time: 11/21/20  1:17 AM         Result  Value  Ref Range            Magnesium  2.3  1.8 - 2.4 mg/dL       RETICULOCYTE COUNT          Collection Time: 11/21/20  1:17 AM         Result  Value  Ref Range            Reticulocyte count  5.9 (H)  0.3 - 2.0 %       Absolute Retic Cnt.  0.1095 (H)  0.026 - 0.095 M/ul       Immature Retic Fraction  22.8 (H)  3.0 - 15.9 %       Retic Hgb Conc.  34  29 - 35 pg       POC URINE MACROSCOPIC          Collection Time: 11/21/20  2:05 AM         Result  Value  Ref Range            Spec. gravity (POC)  1.015  1.001 - 1.023         pH, urine  (POC)  6.0  5.0 - 9.0         Protein (POC)  TRACE (A)  NEG mg/dL       Glucose, urine (POC)  Negative  NEG mg/dL       Ketones (POC)  Negative  NEG mg/dL       Bilirubin (POC)  Negative  NEG         Blood (POC)  Trace Intact (A)  NEG         Urobilinogen (POC)  1.0  0.2 - 1.0 EU/dL       Nitrite (POC)  Negative  NEG         Leukocyte esterase (POC)  Negative  NEG         Performed by  Tomasa Hose         URINE MICROSCOPIC          Collection Time: 11/21/20  2:10 AM         Result  Value  Ref Range            WBC  0-3  0 /hpf       RBC  0-3  0 /hpf       Epithelial cells  0-3  0 /hpf       Bacteria  TRACE  0 /hpf            Casts   0-3  0 /lpf             All Micro Results               Procedure  Component  Value  Units  Date/Time           COVID-19 RAPID TEST [[id number]]  Collected: 11/21/20 0033  Order Status: Completed  Specimen: Nasopharyngeal  Updated: 11/21/20 0108                Specimen source  Nasopharyngeal              COVID-19 rapid test  Not detected                  Comment:        The specimen is NEGATIVE for SARS-CoV-2, the novel coronavirus associated with COVID-19.   A negative result does not rule out COVID-19.         This test has been authorized by the FDA under an Emergency Use Authorization (EUA) for use by authorized laboratories.          Fact sheet for Healthcare Providers: LittleDVDs.dk   Fact sheet for Patients: SatelliteRebate.it         Methodology: Isothermal Nucleic Acid Amplification                        INFLUENZA A & B AG (RAPID TEST) [[id number]]  Collected: 11/21/20 0033            Order Status: Completed  Specimen: Nasopharyngeal from Nasal washing  Updated: 11/21/20 0107                Influenza A Ag  Negative                  Comment:  NEGATIVE FOR THE PRESENCE OF INFLUENZA A ANTIGEN   INFECTION DUE TO INFLUENZA A CANNOT BE RULED OUT.   BECAUSE THE ANTIGEN PRESENT IN THE SAMPLE MAY BE BELOW   THE DETECTION LIMIT OF THE TEST.   A NEGATIVE TEST IS PRESUMPTIVE AND IT IS RECOMMENDED THAT THESE RESULTS BE CONFIRMED BY VIRAL CULTURE OR AN FDA-CLEARED INFLUENZA A AND B MOLECULAR ASSAY.                             Influenza B Ag  Negative                  Comment:  NEGATIVE FOR THE PRESENCE OF INFLUENZA B ANTIGEN   INFECTION DUE TO INFLUENZA B CANNOT BE RULED OUT.   BECAUSE THE ANTIGEN PRESENT IN THE SAMPLE MAY BE BELOW   THE DETECTION LIMIT OF THE TEST.   A NEGATIVE TEST IS PRESUMPTIVE AND IT IS RECOMMENDED THAT THESE RESULTS BE CONFIRMED BY VIRAL CULTURE OR AN FDA-CLEARED INFLUENZA A AND B MOLECULAR ASSAY.                             Source   Nasopharyngeal                     Other Studies:   XR CHEST PA LAT      Result Date: 11/21/2020   EXAM: Chest x-ray. INDICATION: Cough. COMPARISON: Prior chest x-ray on July 16, 2020. TECHNIQUE: Frontal and lateral view chest x-ray. FINDINGS: The lungs are clear. The cardiac size, mediastinal contour and pulmonary vasculature are normal. No pneumothorax  or pleural effusion is seen. There is chronic blunting of the left costophrenic angle. Mild degenerative changes are seen in the spine.       No acute process.      CT CHEST PULMONARY EMBOLISM      Result Date: 11/21/2020   EXAM:  CT Chest with IV contrast - PE protocol. INDICATION: Cough and chills. COMPARISON: None. TECHNIQUE: Axial CT images of the chest were obtained after the intravenous injection of 100 mL Isovue 370 CT contrast. Radiation dose reduction techniques  were used for this study.  Our CT scanners use one or all of the following: Automated exposure control, adjustment of the mA and/or kV according to patient size, iterative reconstruction. FINDINGS: - Pleura/pericardium: Within normal limits. - Lungs:  There is minimal patchy infiltrate in the right middle lobe, suspect for early acute pneumonia. The left lung is clear. - Hila/Mediastinum: Contrast was injected in the right arm. The right brachiocephalic vein is occluded with thrombus. Collateral vessels  are seen along the anterior and right lateral chest wall. - Tracheobronchial tree: Within normal limits. - Aorta/pulmonary arteries: Within normal limits. - Heart: Within normal limits. - Coronary arteries: No coronary artery calcifications. - Chest wall:  Within normal limits. - Spine/bones: No acute process. - Additional comments: Scans through the upper abdomen demonstrate a calcified and shrunken spleen, consistent with the reported history of sickle cell disease.       1. No evidence of pulmonary embolism. 2. Minimal patchy infiltrate in the right middle lobe, suspect for early acute  pneumonia. 3. Thrombosed right brachiocephalic vein, with collateral vessels along the anterior and right lateral chest wall, of indeterminate  age. 4. Shrunken and and calcified spleen, consistent with the reported history of sickle cell disease.            Medications Administered           0.9% sodium chloride infusion         Admin Date   11/21/2020  Action   New Bag  Dose   100 mL/hr  Rate   100 mL/hr  Route   IntraVENous  Administered By   Devona Konig, RN                         acetaminophen (TYLENOL) tablet 1,000 mg         Admin Date   11/21/2020  Action   Given  Dose   1,000 mg  Route   Oral  Administered By   Tomasa Hose, RN                         cefTRIAXone (ROCEPHIN) 1 g in 0.9% sodium chloride (MBP/ADV) 50 mL MBP         Admin Date   11/21/2020  Action   New Bag  Dose   1 g  Rate   100 mL/hr  Route   IntraVENous  Administered By   Devona Konig, RN                         diphenhydrAMINE (BENADRYL) injection 25 mg         Admin Date   11/21/2020  Action   Given  Dose   25 mg  Route   IntraVENous  Administered By   Louis Meckel, RN                         HYDROcodone-homatropine (HYCODAN) 5-1.5 mg/5 mL (5 mL) oral solution 5 mL         Admin Date   11/21/2020  Action   Given  Dose   5 mL  Route   Oral  Administered  By   Devona Konig, RN                         HYDROmorphone (DILAUDID) injection 1 mg         Admin Date   11/21/2020  Action   Given  Dose   1 mg  Route   IntraVENous  Administered By   Devona Konig, RN                         iopamidoL (ISOVUE-370) 76 % injection 100 mL         Admin Date   11/21/2020  Action   Given  Dose   100 mL  Route   IntraVENous  Administered By   Myrla Halsted                         levoFLOXacin [facility]) tablet 750 mg         Admin Date   11/21/2020  Action   Given  Dose   750 mg  Route   Oral  Administered By   Devona Konig, RN                         ondansetron [facility]) injection 4 mg         Admin Date    11/21/2020  Action   Given  Dose   4 mg  Route   IntraVENous  Administered By   Devona Konig, RN                                 Signed:   Elberta Spaniel, MD     Part of this note may have been written by using a voice dictation software.  The note has been proof read but may still contain some grammatical/other typographical errors.

## 2020-11-21 NOTE — Progress Notes (Signed)
Problem: Risk for Spread of Infection  Goal: Prevent transmission of infectious organism to others  Description: Prevent the transmission of infectious organisms to other patients, staff members, and visitors.  Outcome: Progressing Towards Goal     Problem: Patient Education:  Go to Education Activity  Goal: Patient/Family Education  Outcome: Progressing Towards Goal     Problem: Falls - Risk of  Goal: *Absence of Falls  Description: Document [name] Bright Fall Risk and appropriate interventions in the flowsheet.  Outcome: Progressing Towards Goal  Note: Fall Risk Interventions:                      History of Falls Interventions: Investigate reason for fall         Problem: Patient Education: Go to Patient Education Activity  Goal: Patient/Family Education  Outcome: Progressing Towards Goal

## 2020-11-21 NOTE — Progress Notes (Signed)
Brief visit with patient    Calm    Will follow up upon transfer to floor

## 2020-11-21 NOTE — ED Notes (Signed)
Report given to Emily, RN. Care transferred at this time.

## 2020-11-21 NOTE — Progress Notes (Signed)
TRANSFER - IN REPORT:    Verbal report received from [place] on Breely Chant  being received from ED for routine progression of care      Report consisted of patient's Situation, Background, Assessment and   Recommendations(SBAR).     Information from the following report(s) SBAR, Kardex and ED Summary was reviewed with the receiving nurse.    Opportunity for questions and clarification was provided.      Assessment completed upon patient's arrival to unit and care assumed.

## 2020-11-21 NOTE — ED Notes (Signed)
 TRANSFER - OUT REPORT:    Verbal report given to Kaitlyn on [name] Bright  being transferred to 5th for routine progression of care       Report consisted of patient's Situation, Background, Assessment and   Recommendations(SBAR).     Information from the following report(s) ED Summary was reviewed with the receiving nurse.    Lines:   Peripheral IV 11/21/20 Right Arm (Active)   Site Assessment Clean, dry, & intact 11/21/20 0123   Phlebitis Assessment 0 11/21/20 0123   Infiltration Assessment 0 11/21/20 0123   Dressing Status Clean, dry, & intact 11/21/20 0123        Opportunity for questions and clarification was provided.      Patient transported with:    transport

## 2020-11-21 NOTE — Progress Notes (Signed)
Patient being admitted to floor from ER    Lives [facility]    Father - Bethann Berkshire    Full code    No directives

## 2020-11-22 LAB — CBC W/O DIFF
ABSOLUTE NRBC: 0.62 10*3/uL — ABNORMAL HIGH (ref 0.0–0.2)
HCT: 22.9 % — ABNORMAL LOW (ref 35.8–46.3)
HGB: 8.2 g/dL — ABNORMAL LOW (ref 11.7–15.4)
MCH: 37.3 PG — ABNORMAL HIGH (ref 26.1–32.9)
MCHC: 35.8 g/dL — ABNORMAL HIGH (ref 31.4–35.0)
MCV: 104.1 FL — ABNORMAL HIGH (ref 79.6–97.8)
MPV: 8.9 FL — ABNORMAL LOW (ref 9.4–12.3)
PLATELET: 333 10*3/uL (ref 150–450)
RBC: 2.2 M/uL — ABNORMAL LOW (ref 4.05–5.2)
RDW: 17.5 % — ABNORMAL HIGH (ref 11.9–14.6)
WBC: 6.2 10*3/uL (ref 4.3–11.1)

## 2020-11-22 LAB — METABOLIC PANEL, BASIC
Anion gap: 7 mmol/L (ref 7–16)
BUN: 7 MG/DL (ref 6–23)
CO2: 23 mmol/L (ref 21–32)
Calcium: 8.9 MG/DL (ref 8.3–10.4)
Chloride: 112 mmol/L — ABNORMAL HIGH (ref 98–107)
Creatinine: 1.02 MG/DL — ABNORMAL HIGH (ref 0.6–1.0)
GFR est AA: >60 mL/min/{1.73_m2} (ref >60)
GFR est non-AA: >60 mL/min/{1.73_m2} (ref >60)
Glucose: 127 mg/dL — ABNORMAL HIGH (ref 65–100)
Potassium: 4 mmol/L (ref 3.5–5.1)
Sodium: 142 mmol/L (ref 136–145)

## 2020-11-22 LAB — BASIC METABOLIC PANEL
Anion Gap: 7 mmol/L (ref 7–16)
BUN: 7 MG/DL (ref 6–23)
CO2: 23 mmol/L (ref 21–32)
Calcium: 8.9 MG/DL (ref 8.3–10.4)
Chloride: 112 mmol/L — ABNORMAL HIGH (ref 98–107)
Creatinine: 1.02 MG/DL — ABNORMAL HIGH (ref 0.6–1.0)
EGFR IF NonAfrican American: >60 mL/min/{1.73_m2} (ref >60)
GFR African American: >60 mL/min/{1.73_m2} (ref >60)
Glucose: 127 mg/dL — ABNORMAL HIGH (ref 65–100)
Potassium: 4 mmol/L (ref 3.5–5.1)
Sodium: 142 mmol/L (ref 136–145)

## 2020-11-22 LAB — CBC
Hematocrit: 22.9 % — ABNORMAL LOW (ref 35.8–46.3)
Hemoglobin: 8.2 g/dL — ABNORMAL LOW (ref 11.7–15.4)
MCH: 37.3 PG — ABNORMAL HIGH (ref 26.1–32.9)
MCHC: 35.8 g/dL — ABNORMAL HIGH (ref 31.4–35.0)
MCV: 104.1 FL — ABNORMAL HIGH (ref 79.6–97.8)
MPV: 8.9 FL — ABNORMAL LOW (ref 9.4–12.3)
NRBC Absolute: 0.62 10*3/uL — ABNORMAL HIGH (ref 0.0–0.2)
Platelets: 333 10*3/uL (ref 150–450)
RBC: 2.2 M/uL — ABNORMAL LOW (ref 4.05–5.2)
RDW: 17.5 % — ABNORMAL HIGH (ref 11.9–14.6)
WBC: 6.2 10*3/uL (ref 4.3–11.1)

## 2020-11-22 MED FILL — HYDROMORPHONE (PF) 1 MG/ML IJ SOLN: 1 mg/mL | INTRAMUSCULAR | Qty: 1

## 2020-11-22 MED FILL — XARELTO 10 MG TABLET: 10 mg | ORAL | Qty: 2

## 2020-11-22 MED FILL — LEVETIRACETAM 500 MG TAB: 500 mg | ORAL | Qty: 2

## 2020-11-22 MED FILL — FAMOTIDINE 20 MG TAB: 20 mg | ORAL | Qty: 1

## 2020-11-22 MED FILL — CYANOCOBALAMIN 1,000 MCG TAB: 1000 mcg | ORAL | Qty: 1

## 2020-11-22 MED FILL — LEVOFLOXACIN 250 MG TAB: 250 mg | ORAL | Qty: 1

## 2020-11-22 MED FILL — FLUOXETINE 20 MG CAP: 20 mg | ORAL | Qty: 2

## 2020-11-22 MED FILL — FOLIC ACID 1 MG TAB: 1 mg | ORAL | Qty: 1

## 2020-11-22 MED FILL — HYDROCODONE-ACETAMINOPHEN 5 MG-325 MG TAB: 5-325 mg | ORAL | Qty: 1

## 2020-11-22 MED FILL — HYDROXYUREA 500 MG CAPSULE: 500 mg | ORAL | Qty: 2

## 2020-11-22 NOTE — Progress Notes (Signed)
Progress Notes by Argie Ramming, MD at 11/22/20 1420                Author: Argie Ramming, MD  Service: Internal Medicine  Author Type: Physician       Filed: 11/22/20 1424  Date of Service: 11/22/20 1420  Status: Signed          Editor: Argie Ramming, MD (Physician)                           Hospitalist Progress Note     Admit Date:  11/20/2020 11:18 PM    Name:  [name] Bright    Age:  44 y.o.   Sex:  female   DOB:  August 29, 1976    MRN:  [id number]    Room:  534/01      Presenting Complaint: Cough      Reason(s) for Admission: CAP (community acquired pneumonia) [J18.9]   Sickle cell crisis [facility]) [D57.00]         Hospital Course & Interval History:     [name] Bright is a 44 y.o. female with medical history of sickle cell disease, asplenia, SVC occlusion, h/o DVT/PE on Xarelto, Seizure Disorder, GERD,  Depression/Anxiety who presented with cough and pain in rib cage that started this past Monday. 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        Subjective (11/22/20 ):   Cough +        Assessment & Plan:        Principal Problem:     #CAP    - afebrile, HDS without signs of sepsis   - CT chest showing RML infiltrate   - COVID and flu negative   -cont Levaquin 728m PO daily    ??   #Sickle cell crisis    - start aggressive IVF hydration   - start dilaudid and norco prn    - start zofran prn for nausea   - cont home hydrea, folic acid   - no ACS at this time as patient afebrile, satting well on RA   - consult hematology    ??   ??    #Seizure disorder     - cont home Keppra BID   ??   #Hypercoagulable state   #H/o DVT/PE and SVC occlusion     - cont home xarelto daily   - monitor for severe bleeding with frequent epistaxis   ??   #GERD   - start pepcid BID   ??    #Anxiety/Depression   - cont home prozac     Dispo/Discharge Planning:     Home      Diet:  ADULT DIET Regular   DVT PPx: xarelto   Code status: Full Code         Hospital Problems  as of 11/22/2020  Date Reviewed:  03/04/2020                         Codes  Class  Noted - Resolved  POA              Sickle cell crisis ([facility]  ICD-10-CM: D57.00   ICD-9-CM: 282.62    11/21/2020 - Present  Unknown                        * (Principal) CAP (community acquired pneumonia)  ICD-10-CM: J18.9   ICD-9-CM: 486    11/21/2020 - Present  Unknown                        Seizure disorder ([place])  ICD-10-CM: G40.909   ICD-9-CM: 345.90    08/16/2017 - Present  Yes                        Hypercoagulable state ([place])  ICD-10-CM: V56.43   ICD-9-CM: 289.81    08/16/2017 - Present  Yes                        Sickle cell disease ([place])  ICD-10-CM: D57.1   ICD-9-CM: 282.60    08/15/2017 - Present  Yes                            Objective:        Patient Vitals for the past 24 hrs:            Temp  Pulse  Resp  BP  SpO2            11/22/20 1142  98.1 ??F (36.7 ??C)  86  --  100/64  (!) 89 %            11/22/20 0812  98.3 ??F (36.8 ??C)  77  --  104/67  92 %     11/22/20 0311  98.2 ??F (36.8 ??C)  78  14  111/73  94 %     11/22/20 0041  98 ??F (36.7 ??C)  78  16  (!) 98/55  92 %     11/21/20 2057  98.2 ??F (36.8 ??C)  81  20  101/64  93 %            11/21/20 1511  99.1 ??F (37.3 ??C)  78  14  106/63  90 %        Oxygen Therapy   O2 Sat (%): (!) 89 % (11/22/20 1142)   Pulse via Oximetry: 82 beats per minute (11/21/20 1331)   O2 Device: None (Room air) (11/21/20 1118)      Estimated body mass index is 25.21 kg/m?? as calculated from the following:     Height as of this encounter: '5\' 8"'  (1.727 m).     Weight as of this encounter: 75.2 kg (165 lb 12.8 oz).      Intake/Output Summary (Last 24 hours) at 11/22/2020 1420   Last data filed at 11/22/2020 0530     Gross per 24 hour        Intake  1448.33 ml        Output  --        Net  1448.33 ml             Physical Exam:        Blood pressure 100/64, pulse 86, temperature 98.1 ??F (36.7 ??C), resp. rate 14, height '5\' 8"'  (1.727 m), weight 75.2 kg (165 lb 12.8 oz), SpO2  (!) 89 %.   General:    Well nourished.  No overt distress   Head:  Normocephalic, atraumatic   Eyes:  Sclerae appear normal.  Pupils equally round.  ENT:  Nares appear normal, no drainage.  Moist oral mucosa   Neck:  No restricted ROM.  Trachea midline    CV:   RRR.  No m/r/g.  No jugular venous distension.   Lungs:   CTAB.  No wheezing, rhonchi, or rales.  Respirations even, unlabored   Abdomen:   Bowel sounds present.  Soft, nontender, nondistended.   Extremities: No cyanosis or clubbing.  No edema   Skin:     No rashes and normal coloration.   Warm and dry.     Neuro:  CN II-XII grossly intact.   Sensation intact.  A&Ox3   Psych:  Normal mood and affect.        I have reviewed ordered lab tests and independently visualized imaging below:      Recent Labs:     Recent Results (from the past 48 hour(s))     EKG, 12 LEAD, INITIAL          Collection Time: 11/21/20 12:20 AM         Result  Value  Ref Range            Ventricular Rate  73  BPM       Atrial Rate  74  BPM       P-R Interval  145  ms       QRS Duration  83  ms       Q-T Interval  391  ms       QTC Calculation (Bezet)  431  ms       Calculated P Axis  65  degrees       Calculated R Axis  52  degrees       Calculated T Axis  38  degrees       Diagnosis                 Sinus rhythm   Abnormal R-wave progression, early transition      Confirmed by SENFIELD, JEFFERY ([id number]) on 11/21/2020 10:35:56 AM          INFLUENZA A & B AG (RAPID TEST)          Collection Time: 11/21/20 12:33 AM         Result  Value  Ref Range            Influenza A Ag  Negative  NEG         Influenza B Ag  Negative  NEG         Source  Nasopharyngeal          COVID-19 RAPID TEST          Collection Time: 11/21/20 12:33 AM         Result  Value  Ref Range            Specimen source  Nasopharyngeal          COVID-19 rapid test  Not detected  NOTD          CBC WITH AUTOMATED DIFF          Collection Time: 11/21/20  1:17 AM         Result  Value  Ref Range            WBC  5.4  4.3 - 11.1 K/uL       RBC  2.33 (L)  4.05 - 5.2 M/uL       HGB  9.0 (L)  11.7 - 15.4  g/dL       HCT  25.2 (L)  35.8 - 46.3 %       MCV  108.2 (H)  79.6 - 97.8 FL       MCH  38.6 (H)  26.1 - 32.9 PG       MCHC  35.7 (H)  31.4 - 35.0 g/dL       RDW  15.9 (H)  11.9 - 14.6 %       PLATELET  360  150 - 450 K/uL       MPV  9.1 (L)  9.4 - 12.3 FL       ABSOLUTE NRBC  0.04  0.0 - 0.2 K/uL       NEUTROPHILS  34 (L)  43 - 78 %       LYMPHOCYTES  51 (H)  13 - 44 %       MONOCYTES  12  4.0 - 12.0 %       EOSINOPHILS  1  0.5 - 7.8 %       BASOPHILS  1  0.0 - 2.0 %       IMMATURE GRANULOCYTES  1  0.0 - 5.0 %       ABS. NEUTROPHILS  1.8  1.7 - 8.2 K/UL       ABS. LYMPHOCYTES  2.7  0.5 - 4.6 K/UL       ABS. MONOCYTES  0.6  0.1 - 1.3 K/UL       ABS. EOSINOPHILS  0.1  0.0 - 0.8 K/UL       ABS. BASOPHILS  0.1  0.0 - 0.2 K/UL       ABS. IMM. GRANS.  0.1  0.0 - 0.5 K/UL       RBC COMMENTS  SLIGHT   ANISOCYTOSIS + POIKILOCYTOSIS             RBC COMMENTS  OCCASIONAL   TARGET CELLS             RBC COMMENTS  SLIGHT   MACROCYTOSIS             RBC COMMENTS  SLIGHT   POLYCHROMASIA             WBC COMMENTS  Result Confirmed By Smear          PLATELET COMMENTS  ADEQUATE          DF  AUTOMATED          METABOLIC PANEL, COMPREHENSIVE          Collection Time: 11/21/20  1:17 AM         Result  Value  Ref Range            Sodium  142  136 - 145 mmol/L       Potassium  4.3  3.5 - 5.1 mmol/L       Chloride  113 (H)  98 - 107 mmol/L       CO2  22  21 - 32 mmol/L       Anion gap  7  7 - 16 mmol/L       Glucose  102 (H)  65 - 100 mg/dL       BUN  8  6 - 23 MG/DL       Creatinine  1.06 (H)  0.6 - 1.0 MG/DL       GFR est AA  >60  >60 ml/min/1.6m       GFR est non-AA  59 (L)  >60 ml/min/1.6m       Calcium  9.1  8.3 - 10.4 MG/DL       Bilirubin, total  0.9  0.2 - 1.1 MG/DL       ALT (SGPT)  20  12 - 65 U/L       AST (SGOT)  30   15 - 37 U/L       Alk. phosphatase  126  50 - 136 U/L       Protein, total  8.4 (H)  6.3 - 8.2 g/dL       Albumin  3.9  3.5 - 5.0 g/dL       Globulin  4.5 (H)  2.3 - 3.5 g/dL       A-G Ratio  0.9 (L)  1.2 - 3.5         MAGNESIUM          Collection Time: 11/21/20  1:17 AM         Result  Value  Ref Range            Magnesium  2.3  1.8 - 2.4 mg/dL       RETICULOCYTE COUNT          Collection Time: 11/21/20  1:17 AM         Result  Value  Ref Range            Reticulocyte count  5.9 (H)  0.3 - 2.0 %       Absolute Retic Cnt.  0.1095 (H)  0.026 - 0.095 M/ul       Immature Retic Fraction  22.8 (H)  3.0 - 15.9 %       Retic Hgb Conc.  34  29 - 35 pg       POC URINE MACROSCOPIC          Collection Time: 11/21/20  2:05 AM         Result  Value  Ref Range            Spec. gravity (POC)  1.015  1.001 - 1.023         pH, urine  (POC)  6.0  5.0 - 9.0         Protein (POC)  TRACE (A)  NEG mg/dL       Glucose, urine (POC)  Negative  NEG mg/dL       Ketones (POC)  Negative  NEG mg/dL       Bilirubin (POC)  Negative  NEG         Blood (POC)  Trace Intact (A)  NEG         Urobilinogen (POC)  1.0  0.2 - 1.0 EU/dL       Nitrite (POC)  Negative  NEG         Leukocyte esterase (POC)  Negative  NEG         Performed by  MTomasa Hose        URINE MICROSCOPIC          Collection Time: 11/21/20  2:10 AM         Result  Value  Ref Range            WBC  0-3  0 /hpf       RBC  0-3  0 /hpf       Epithelial cells  0-3  0 /hpf       Bacteria  TRACE  0 /hpf       Casts  0-3  0 /lpf       METABOLIC PANEL, BASIC          Collection Time: 11/22/20  6:09 AM         Result  Value  Ref Range            Sodium  142  136 - 145 mmol/L       Potassium  4.0  3.5 - 5.1 mmol/L       Chloride  112 (H)  98 - 107 mmol/L       CO2  23  21 - 32 mmol/L       Anion gap  7  7 - 16 mmol/L       Glucose  127 (H)  65 - 100 mg/dL       BUN  7  6 - 23 MG/DL       Creatinine  1.02 (H)  0.6 - 1.0 MG/DL       GFR est AA  >60  >60 ml/min/1.73m       GFR est non-AA  >60   >60 ml/min/1.764m      Calcium  8.9  8.3 - 10.4 MG/DL       CBC W/O DIFF          Collection Time: 11/22/20  6:09 AM         Result  Value  Ref Range            WBC  6.2  4.3 - 11.1 K/uL       RBC  2.20 (L)  4.05 - 5.2 M/uL       HGB  8.2 (L)  11.7 - 15.4 g/dL       HCT  22.9 (L)  35.8 - 46.3 %       MCV  104.1 (H)  79.6 - 97.8 FL       MCH  37.3 (H)  26.1 - 32.9 PG       MCHC  35.8 (H)  31.4 - 35.0 g/dL       RDW  17.5 (H)  11.9 - 14.6 %       PLATELET  333  150 - 450 K/uL       MPV  8.9 (L)  9.4 - 12.3 FL            ABSOLUTE NRBC  0.62 (H)  0.0 - 0.2 K/uL             All Micro Results               Procedure  Component  Value  Units  Date/Time           COVID-19 RAPID TEST [[id number]]Collected: 11/21/20 0033            Order Status: Completed  Specimen: Nasopharyngeal  Updated: 11/21/20 0108                Specimen source  Nasopharyngeal              COVID-19 rapid test  Not detected                  Comment:        The specimen is NEGATIVE for SARS-CoV-2, the novel coronavirus associated with COVID-19.   A negative result does not rule out COVID-19.         This test  has been authorized by the FDA under an Emergency Use Authorization (EUA) for use by authorized laboratories.          Fact sheet for Healthcare Providers: LittleDVDs.dk   Fact sheet for Patients: SatelliteRebate.it         Methodology: Isothermal Nucleic Acid Amplification                        INFLUENZA A & B AG (RAPID TEST) [[id number]]  Collected: 11/21/20 0033            Order Status: Completed  Specimen: Nasopharyngeal from Nasal washing  Updated: 11/21/20 0107                Influenza A Ag  Negative                  Comment:  NEGATIVE FOR THE PRESENCE OF INFLUENZA A ANTIGEN   INFECTION DUE TO INFLUENZA A CANNOT BE RULED OUT.   BECAUSE THE ANTIGEN PRESENT IN THE SAMPLE MAY BE BELOW   THE DETECTION LIMIT OF THE TEST.   A NEGATIVE TEST IS PRESUMPTIVE AND IT IS RECOMMENDED THAT THESE RESULTS BE  CONFIRMED BY VIRAL CULTURE OR AN FDA-CLEARED INFLUENZA A AND B MOLECULAR ASSAY.                             Influenza B Ag  Negative                  Comment:  NEGATIVE FOR THE PRESENCE OF INFLUENZA B ANTIGEN   INFECTION DUE TO INFLUENZA B CANNOT BE RULED OUT.   BECAUSE THE ANTIGEN PRESENT IN THE SAMPLE MAY BE BELOW   THE DETECTION LIMIT OF THE TEST.   A NEGATIVE TEST IS PRESUMPTIVE AND IT IS RECOMMENDED THAT THESE RESULTS BE CONFIRMED BY VIRAL CULTURE OR AN FDA-CLEARED INFLUENZA A AND B MOLECULAR ASSAY.                             Source  Nasopharyngeal                     Other Studies:   No results found.      Current Meds:     Current Facility-Administered Medications          Medication  Dose  Route  Frequency           ?  HYDROcodone-homatropine (HYCODAN) 5-1.5 mg/5 mL (5 mL) oral solution 5 mL   5 mL  Oral  Q4H PRN     ?  sodium chloride (NS) flush 5-40 mL   5-40 mL  IntraVENous  Q8H     ?  sodium chloride (NS) flush 5-40 mL   5-40 mL  IntraVENous  PRN     ?  acetaminophen (TYLENOL) tablet 650 mg   650 mg  Oral  Q4H PRN     ?  HYDROcodone-acetaminophen (NORCO) 5-325 mg per tablet 1 Tablet   1 Tablet  Oral  Q4H PRN     ?  naloxone (NARCAN) injection 0.4 mg   0.4 mg  IntraVENous  PRN           ?  albuterol (PROVENTIL VENTOLIN) nebulizer solution 2.5 mg   2.5 mg  Nebulization  Q4H PRN           ?  LORazepam (  ATIVAN) tablet 0.5-1 mg   0.5-1 mg  Oral  Q6H PRN     ?  senna-docusate (PERICOLACE) 8.6-50 mg per tablet 2 Tablet   2 Tablet  Oral  BID PRN     ?  levoFLOXacin (LEVAQUIN) tablet 750 mg   750 mg  Oral  DAILY     ?  ondansetron (ZOFRAN) injection 4 mg   4 mg  IntraVENous  Q4H PRN     ?  promethazine (PHENERGAN) with saline injection 12.5 mg   12.5 mg  IntraVENous  Q6H PRN     ?  0.9% sodium chloride infusion   100 mL/hr  IntraVENous  CONTINUOUS     ?  cyanocobalamin tablet 500 mcg   500 mcg  Oral  DAILY     ?  FLUoxetine (PROzac) capsule 40 mg   40 mg  Oral  DAILY     ?  folic acid (FOLVITE) tablet 1 mg   1  mg  Oral  DAILY     ?  hydroxyurea (HYDREA) chemo cap 1,000 mg   1,000 mg  Oral  DAILY     ?  levETIRAcetam (KEPPRA) tablet 1,000 mg   1,000 mg  Oral  BID     ?  rivaroxaban (XARELTO) tablet 20 mg   20 mg  Oral  DAILY WITH BREAKFAST     ?  famotidine (PEPCID) tablet 20 mg   20 mg  Oral  BID           ?  HYDROmorphone (DILAUDID) injection 0.5 mg   0.5 mg  IntraVENous  Q3H PRN           Signed:   Argie Ramming, MD      Part of this note may have been written by using a voice dictation software.  The note has been proof read but may still contain some grammatical/other typographical errors.

## 2020-11-22 NOTE — Progress Notes (Signed)
Comprehensive Nutrition Assessment    Type and Reason for Visit: Initial,Positive nutrition screen  Best Practice Alert for EN/PN PTA      Patient seen reclined in bed. Admitted for pneumonia and sickle cell crisis. He states that she had TF back in 1998 when she had her daughter as she was in a coma. She states none since. She states that she was eating normally prior to admission and has been eating well since. She denies any needs or questions.    Nutrition to continue to follow per protocol.    Candelaria Stagers RD, CSO, LD on 11/22/2020 at 12:04 PM  Contact: [phone]

## 2020-11-22 NOTE — Progress Notes (Signed)
Problem: Risk for Spread of Infection  Goal: Prevent transmission of infectious organism to others  Description: Prevent the transmission of infectious organisms to other patients, staff members, and visitors.  Outcome: Progressing Towards Goal     Problem: Patient Education:  Go to Education Activity  Goal: Patient/Family Education  Outcome: Progressing Towards Goal     Problem: Falls - Risk of  Goal: *Absence of Falls  Description: Document Bridgette Habermann Fall Risk and appropriate interventions in the flowsheet.  Outcome: Progressing Towards Goal  Note: Fall Risk Interventions:            Medication Interventions: Patient to call before getting OOB,Teach patient to arise slowly         History of Falls Interventions: Room close to nurse's station,Door open when patient unattended         Problem: Patient Education: Go to Patient Education Activity  Goal: Patient/Family Education  Outcome: Progressing Towards Goal

## 2020-11-22 NOTE — Progress Notes (Signed)
Progress  Notes by Real Cons, MD at 11/22/20 1326                Author: Real Cons, MD  Service: Hematology and Oncology  Author Type: Physician       Filed: 11/22/20 1752  Date of Service: 11/22/20 1326  Status: Addendum          Editor: Real Cons, MD (Physician)          Related Notes: Original Note by Trisha Mangle, NP (Nurse Practitioner) filed at 11/22/20 1327                       [facility] Hematology & Oncology          Inpatient Hematology / Oncology Progress Note         Admission Date: 11/20/2020 11:18 PM   Reason for Admission/Hospital Course: CAP (community acquired pneumonia) [J18.9]   Sickle cell crisis (HCC) [D57.00]         24 Hour Events:   Afebrile, VSS   Hgb 8.2   On LVQ for pneumonia      ROS:   Constitutional: Negative for fever, chills.   CV: Negative for chest pain, palpitations, edema.   Respiratory: +dyspnea, cough. Negative for wheezing.   GI: +nausea. Negative for diarrhea.   MSK: +back, arms, legs, and lower abd pain c/w previous crises      10 point review of systems is otherwise negative with the exception of the elements mentioned above in the HPI.         Allergies        Allergen  Reactions         ?  Latex, Natural Rubber  Rash     ?  Rocephin [Ceftriaxone]  Other (comments)     ?  Demerol [Meperidine]  Seizures     ?  Fentanyl  Other (comments)             "my doctor said I had a stroke from it"         ?  Flu Vaccine 2011 (36 Mos+)(Pf)  Swelling     ?  Morphine  Itching         ?  Oxycontin [Oxycodone]  Vertigo           OBJECTIVE:   Patient Vitals for the past 8 hrs:             BP  Temp  Pulse  Resp  SpO2  Weight             11/22/20 0812  104/67  98.3 ??F (36.8 ??C)  77  --  92 %  --             11/22/20 0311  111/73  98.2 ??F (36.8 ??C)  78  14  94 %  165 lb 12.8 oz (75.2 kg)        Temp (24hrs), Avg:98.4 ??F (36.9 ??C), Min:98 ??F (36.7 ??C), Max:99.1 ??F (37.3 ??C)      No intake/output data recorded.      Physical Exam:      Constitutional:  Well developed, well  nourished female in no acute  distress, lying comfortably in the hospital bed.         HEENT:  Normocephalic and atraumatic. Oropharynx is clear, mucous membranes are moist.  Extraocular muscles are intact.  Sclerae anicteric. Neck supple without JVD.  No thyromegaly present.  Skin  Warm and dry.  No bruising and no rash noted.  No erythema.  No pallor.      Respiratory  Lungs CTAB, normal air exchange without accessory muscle use.      CVS  Normal rate, regular rhythm and normal S1 and S2.  No murmurs, gallops, or rubs.     Abdomen  Soft, nontender and nondistended, normoactive bowel sounds.  No palpable mass.  No hepatosplenomegaly.     Neuro  Grossly nonfocal with no obvious sensory or motor deficits.     MSK  Normal range of motion in general.  No edema and no tenderness.     Psych  Appropriate mood and affect.            Labs:          Recent Labs            11/22/20   0609  11/21/20   0117     WBC  6.2  5.4     RBC  2.20*  2.33*     HGB  8.2*  9.0*     HCT  22.9*  25.2*     MCV  104.1*  108.2*     MCH  37.3*  38.6*     MCHC  35.8*  35.7*     RDW  17.5*  15.9*     PLT  333  360     GRANS   --   34*     LYMPH   --   51*     MONOS   --   12     EOS   --   1     BASOS   --   1     IG   --   1     DF   --   AUTOMATED     ANEU   --   1.8     ABL   --   2.7     ABM   --   0.6     ABE   --   0.1     ABB   --   0.1         AIG   --   0.1              Recent Labs            11/22/20   0609  11/21/20   0117     NA  142  142     K  4.0  4.3     CL  112*  113*     CO2  23  22     AGAP  7  7     GLU  127*  102*     BUN  7  8     CREA  1.02*  1.06*     GFRAA  >60  >60     GFRNA  >60  59*     CA  8.9  9.1     AP   --   126     TP   --   8.4*     ALB   --   3.9     GLOB   --   4.5*     AGRAT   --   0.9*         MG   --   2.3  Imaging:      CT CHEST PULMONARY EMBOLISM [[id number]]  Collected: 11/21/20 0217     Order Status: Completed  Updated: 11/21/20 0229       Narrative: ??       EXAM: CT Chest with IV contrast  - PE protocol.     INDICATION: Cough and chills.     COMPARISON: None.     TECHNIQUE: Axial  CT images of the chest were obtained after the intravenous   injection of 100 mL Isovue 370 CT contrast. Radiation dose reduction techniques   were used for this study. ??Our CT scanners use one or all of the following:   Automated exposure  control, adjustment of the mA and/or kV according to patient   size, iterative reconstruction.     FINDINGS:   - Pleura/pericardium: Within normal limits.   - Lungs: There is minimal patchy infiltrate in the right middle lobe, suspect    for early acute pneumonia. The left lung is clear.   - Hila/Mediastinum: Contrast was injected in the right arm. The right   brachiocephalic vein is occluded with thrombus. Collateral vessels are seen   along the anterior and right lateral  chest wall.   - Tracheobronchial tree: Within normal limits.   - Aorta/pulmonary arteries: Within normal limits.   - Heart: Within normal limits.   - Coronary arteries: No coronary artery calcifications.   - Chest wall: Within normal  limits.   - Spine/bones: No acute process.   - Additional comments: Scans through the upper abdomen demonstrate a calcified   and shrunken spleen, consistent with the reported history of sickle cell   disease.        Impression: ??     1. No evidence of pulmonary embolism.   2. Minimal patchy infiltrate in the right middle lobe, suspect for early acute   pneumonia.    3. Thrombosed right brachiocephalic vein, with collateral vessels along the   anterior and right lateral chest wall, of indeterminate age.   4. Shrunken and and calcified spleen, consistent with the reported history of   sickle cell disease.         XR CHEST PA LAT [[id number]]  Collected: 11/21/20 0017     Order Status: Completed  Updated: 11/21/20 0020       Narrative: ??     EXAM: Chest x-ray.     INDICATION: Cough.     COMPARISON: Prior chest x-ray on July 16, 2020.     TECHNIQUE: Frontal and lateral  view chest x-ray.      FINDINGS: The lungs are clear. The cardiac size, mediastinal contour and   pulmonary vasculature are normal. No pneumothorax or pleural effusion is seen.   There is chronic blunting of the left costophrenic angle.  Mild degenerative   changes are seen in the spine.      Impression: ??     No acute process.              Medications:     Current Facility-Administered Medications          Medication  Dose  Route  Frequency           ?  HYDROcodone-homatropine (HYCODAN) 5-1.5 mg/5 mL (5 mL) oral solution 5 mL   5 mL  Oral  Q4H PRN     ?  sodium chloride (NS) flush 5-40 mL   5-40 mL  IntraVENous  Q8H     ?  sodium chloride (NS)  flush 5-40 mL   5-40 mL  IntraVENous  PRN     ?  acetaminophen (TYLENOL) tablet 650 mg   650 mg  Oral  Q4H PRN     ?  HYDROcodone-acetaminophen (NORCO) 5-325 mg per tablet 1 Tablet   1 Tablet  Oral  Q4H PRN     ?  naloxone (NARCAN) injection 0.4 mg   0.4 mg  IntraVENous  PRN           ?  albuterol (PROVENTIL VENTOLIN) nebulizer solution 2.5 mg   2.5 mg  Nebulization  Q4H PRN           ?  LORazepam (ATIVAN) tablet 0.5-1 mg   0.5-1 mg  Oral  Q6H PRN     ?  senna-docusate (PERICOLACE) 8.6-50 mg per tablet 2 Tablet   2 Tablet  Oral  BID PRN     ?  levoFLOXacin (LEVAQUIN) tablet 750 mg   750 mg  Oral  DAILY     ?  ondansetron (ZOFRAN) injection 4 mg   4 mg  IntraVENous  Q4H PRN     ?  promethazine (PHENERGAN) with saline injection 12.5 mg   12.5 mg  IntraVENous  Q6H PRN     ?  0.9% sodium chloride infusion   100 mL/hr  IntraVENous  CONTINUOUS     ?  cyanocobalamin tablet 500 mcg   500 mcg  Oral  DAILY     ?  FLUoxetine (PROzac) capsule 40 mg   40 mg  Oral  DAILY     ?  folic acid (FOLVITE) tablet 1 mg   1 mg  Oral  DAILY     ?  hydroxyurea (HYDREA) chemo cap 1,000 mg   1,000 mg  Oral  DAILY     ?  levETIRAcetam (KEPPRA) tablet 1,000 mg   1,000 mg  Oral  BID     ?  rivaroxaban (XARELTO) tablet 20 mg   20 mg  Oral  DAILY WITH BREAKFAST     ?  famotidine (PEPCID) tablet 20 mg   20 mg  Oral  BID            ?  HYDROmorphone (DILAUDID) injection 0.5 mg   0.5 mg  IntraVENous  Q3H PRN              ASSESSMENT:         Problem List   Date Reviewed:  03/19/20                        Codes  Class  Noted             Sickle cell crisis (HCC)  ICD-10-CM: D57.00   ICD-9-CM: 282.62    11/21/2020                       * (Principal) CAP (community acquired pneumonia)  ICD-10-CM: J18.9   ICD-9-CM: 486    11/21/2020                       Left arm swelling  ICD-10-CM: M79.89   ICD-9-CM: 729.81    08/18/2017                       Seizure disorder (HCC)  ICD-10-CM: G40.909   ICD-9-CM: 345.90    08/16/2017  Hypercoagulable state [facility])  ICD-10-CM: W09.81   ICD-9-CM: 289.81    08/16/2017                       Sickle cell disease [facility])  ICD-10-CM: D57.1   ICD-9-CM: 282.60    08/15/2017                       Ms. Youngren is a 44 y.o. female admitted on 11/20/2020 with a primary diagnosis of PNA and SCD.     ??   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  She p/w cough and rib pain since Mon.  Mother ill with cough and in contact with pt.  Not vaccinated against  COVID -stated that she had a severe rxn to influenza vaccine in the past.  No fevers at home or in ED.  + productive cough x 2 days.  + nausea/lightheadedness.  COVID neg.  Flu neg.  ~3 yearly admissions due to SCD crisis.  Has been on hydroxyurea  since ~44yo.  Has FU with PCP and not heme/onc in town recently.  Has seen Dr Welton Flakes and Threasa Beards in 03/2020.  Was recommended to c/w Crizanlizumab.  Of note, she is planning to move to [place] before the end of the year.  She's been on hydroxyurea  daily,  FA /day and vit D.  CT in ED w/out PE but patchy infiltrate in RML.  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  Started in ceftriaxone in ED but started itching and switched over to [facility] for CAP.  Currently appears  comfortable.  No acute distress  resting in bed.       PLAN:   SCD - HbSS - baseline Hb ~9 with crisis    - her regimen includes hydroxyurea and FA, has been on Crizanlizumab in the past.  Lost to FU with [facility] Dr Kristopher Glee.  Recently at [facility] with pain crisis in 10/2020.  Moved to [place] and now planning to move to [place].     - Hb relatively stable    - checking Hb F with Hb electrophoresis    - started on IVF/Dilaudid and Norco (pt on acetaminophen as needed outpt/recently at [facility] and advised to cut back - was taking >6/day).     12/20 Hgb 8.2.  Hgb elect pending.   ??   CAP    - ill contact - mother; started on ceftriaxone --> switched to LVQ due to rxn, COVID/flu neg    - no fever/O2 sat's adequate, not c/w ACS, no need for exchange transfusion at this time.  Hb also stable at 9.     12/20 On LVQ.   ??   Seizure d/o - c/w Keppra   ??   Hx of DVT/PE & SVC occlusion   - has been on Xarelto and ASA (previously)    - monitor for bleeding       Goals and plan of care reviewed with the patient.  All questions answered to the best of our ability.  Thank you for allowing [place] to participate in the care of Ms. Daphine Deutscher.  We will sign off; please do not hesitate to call with questions.  Ms. Kasal states  she does not want to f/u with a local hematologist and will arrange f/u on her own when she moves to [place] soon.  Trisha Mangle, NP    [facility] Hematology & Oncology   [address]   [place] [address]   Office : [phone]   Fax : [phone]             Attending Addendum:   I have personally performed a face to face diagnostic evaluation on this patient. I have reviewed and agree with the care plan as documented by Trisha Mangle, N.P. My findings are as follows: She has Sickle Cell Disease and Pneumonia, appears weak, heart  rate regular without murmurs, abdomen is non-tender, bowel sounds are positive, we will sign off.                   Sharin Grave, MD           [facility] Hematology/Oncology   [address]    [place] [address]   Office : [phone]   Fax : [phone]

## 2020-11-23 LAB — METABOLIC PANEL, BASIC
Anion gap: 7 mmol/L (ref 7–16)
BUN: 8 MG/DL (ref 6–23)
CO2: 23 mmol/L (ref 21–32)
Calcium: 9.2 MG/DL (ref 8.3–10.4)
Chloride: 110 mmol/L — ABNORMAL HIGH (ref 98–107)
Creatinine: 0.93 MG/DL (ref 0.6–1.0)
GFR est AA: >60 mL/min/{1.73_m2} (ref >60)
GFR est non-AA: >60 mL/min/{1.73_m2} (ref >60)
Glucose: 96 mg/dL (ref 65–100)
Potassium: 3.9 mmol/L (ref 3.5–5.1)
Sodium: 140 mmol/L (ref 136–145)

## 2020-11-23 LAB — CBC W/O DIFF
ABSOLUTE NRBC: 0.98 10*3/uL — ABNORMAL HIGH (ref 0.0–0.2)
HCT: 22.6 % — ABNORMAL LOW (ref 35.8–46.3)
HGB: 8.2 g/dL — ABNORMAL LOW (ref 11.7–15.4)
MCH: 39 PG — ABNORMAL HIGH (ref 26.1–32.9)
MCHC: 36.3 g/dL — ABNORMAL HIGH (ref 31.4–35.0)
MCV: 107.6 FL — ABNORMAL HIGH (ref 79.6–97.8)
MPV: 9.2 FL — ABNORMAL LOW (ref 9.4–12.3)
PLATELET: 365 10*3/uL (ref 150–450)
RBC: 2.1 M/uL — ABNORMAL LOW (ref 4.05–5.2)
RDW: 18.2 % — ABNORMAL HIGH (ref 11.9–14.6)
WBC: 6.7 10*3/uL (ref 4.3–11.1)

## 2020-11-23 LAB — LD: LD: 361 U/L — ABNORMAL HIGH (ref 100–190)

## 2020-11-23 LAB — RETICULOCYTE COUNT
Absolute Retic Cnt.: 0.206 M/ul — ABNORMAL HIGH (ref 0.026–0.095)
Absolute Retic Cnt.: 0.206 M/ul — ABNORMAL HIGH (ref 0.026–0.095)
Immature Retic Fraction: 23.9 % — ABNORMAL HIGH (ref 3.0–15.9)
Immature Retic Fraction: 23.9 % — ABNORMAL HIGH (ref 3.0–15.9)
Retic Ct Pct: 9.8 % — ABNORMAL HIGH (ref 0.3–2.0)
Retic Hgb Conc.: 35 pg (ref 29–35)
Retic Hgb Conc.: 35 pg (ref 29–35)
Reticulocyte count: 9.8 % — ABNORMAL HIGH (ref 0.3–2.0)

## 2020-11-23 LAB — BASIC METABOLIC PANEL
Anion Gap: 7 mmol/L (ref 7–16)
BUN: 8 MG/DL (ref 6–23)
CO2: 23 mmol/L (ref 21–32)
Calcium: 9.2 MG/DL (ref 8.3–10.4)
Chloride: 110 mmol/L — ABNORMAL HIGH (ref 98–107)
Creatinine: 0.93 MG/DL (ref 0.6–1.0)
EGFR IF NonAfrican American: >60 mL/min/{1.73_m2} (ref >60)
GFR African American: >60 mL/min/{1.73_m2} (ref >60)
Glucose: 96 mg/dL (ref 65–100)
Potassium: 3.9 mmol/L (ref 3.5–5.1)
Sodium: 140 mmol/L (ref 136–145)

## 2020-11-23 LAB — CBC
Hematocrit: 22.6 % — ABNORMAL LOW (ref 35.8–46.3)
Hemoglobin: 8.2 g/dL — ABNORMAL LOW (ref 11.7–15.4)
MCH: 39 PG — ABNORMAL HIGH (ref 26.1–32.9)
MCHC: 36.3 g/dL — ABNORMAL HIGH (ref 31.4–35.0)
MCV: 107.6 FL — ABNORMAL HIGH (ref 79.6–97.8)
MPV: 9.2 FL — ABNORMAL LOW (ref 9.4–12.3)
NRBC Absolute: 0.98 10*3/uL — ABNORMAL HIGH (ref 0.0–0.2)
Platelets: 365 10*3/uL (ref 150–450)
RBC: 2.1 M/uL — ABNORMAL LOW (ref 4.05–5.2)
RDW: 18.2 % — ABNORMAL HIGH (ref 11.9–14.6)
WBC: 6.7 10*3/uL (ref 4.3–11.1)

## 2020-11-23 LAB — LACTATE DEHYDROGENASE: LD: 361 U/L — ABNORMAL HIGH (ref 100–190)

## 2020-11-23 MED FILL — HYDROCODONE-ACETAMINOPHEN 5 MG-325 MG TAB: 5-325 mg | ORAL | Qty: 1

## 2020-11-23 MED FILL — FAMOTIDINE 20 MG TAB: 20 mg | ORAL | Qty: 1

## 2020-11-23 MED FILL — LEVETIRACETAM 500 MG TAB: 500 mg | ORAL | Qty: 2

## 2020-11-23 MED FILL — FOLIC ACID 1 MG TAB: 1 mg | ORAL | Qty: 1

## 2020-11-23 MED FILL — HYDROMORPHONE (PF) 1 MG/ML IJ SOLN: 1 mg/mL | INTRAMUSCULAR | Qty: 1

## 2020-11-23 MED FILL — HYDROXYUREA 500 MG CAPSULE: 500 mg | ORAL | Qty: 2

## 2020-11-23 MED FILL — FLUOXETINE 20 MG CAP: 20 mg | ORAL | Qty: 2

## 2020-11-23 MED FILL — LEVOFLOXACIN 250 MG TAB: 250 mg | ORAL | Qty: 1

## 2020-11-23 MED FILL — XARELTO 10 MG TABLET: 10 mg | ORAL | Qty: 2

## 2020-11-23 MED FILL — CYANOCOBALAMIN 1,000 MCG TAB: 1000 mcg | ORAL | Qty: 1

## 2020-11-23 NOTE — Progress Notes (Signed)
Emergency Contact Veleka Djordjevic [phone]  RNCM during ACT rounds spoke with patient with MD about possible discharge tomorrow.   No needs from patient. Patient is planning to move [place] and will establish a new PCP and oncology to mange cycle cell   Will continue to follow for discharge planning needs  Please consult case manager if any new issues arise      Discharge plan is no further needs. Home with family assitance

## 2020-11-23 NOTE — Consults (Signed)
Consults by Trisha Mangle, NP at 11/23/20 0740                Author: Trisha Mangle, NP  Service: Hematology and Oncology  Author Type: Nurse Practitioner       Filed: 11/23/20 0741  Date of Service: 11/23/20 0740  Status: Signed          Editor: Trisha Mangle, NP (Nurse Practitioner)            Consult Orders        1. IP CONSULT TO HEMATOLOGY [[id number]] ordered by Lutricia Horsfall, MD at 11/22/20 1425                              This is a duplicate consult.  Please see consult note from 12/19 and progress note from 12/20.  Please call if we can be of further assistance.           Trisha Mangle, NP   [facility] Hematology & Oncology

## 2020-11-23 NOTE — ACP (Advance Care Planning) (Signed)
 Advance Care Planning     General Advance Care Planning (ACP) Conversation      Date of Conversation: 11/20/2020  Conducted with: Patient with Decision Making Capacity    Healthcare Decision Maker:     Primary Decision Maker: Nicol, Herbig - Father - [phone]  Click here to complete Diplomatic Services operational officer including selection of the Healthcare Decision Maker Relationship (ie Primary)          Content/Action Overview:   DECLINED ACP conversation - will revisit periodically   Reviewed DNR/DNI and patient elects Full Code (Attempt Resuscitation)         Length of Voluntary ACP Conversation in minutes:  <16 minutes (Non-Billable)    Katheryn DELENA Haff, RN

## 2020-11-23 NOTE — Progress Notes (Signed)
Progress Notes by Lutricia Horsfall, MD at 11/23/20 1452                Author: Lutricia Horsfall, MD  Service: Internal Medicine  Author Type: Physician       Filed: 11/23/20 1453  Date of Service: 11/23/20 1452  Status: Signed          Editor: Lutricia Horsfall, MD (Physician)                           Hospitalist Progress Note     Admit Date:  11/20/2020 11:18 PM    Name:  [name] Bright    Age:  44 y.o.   Sex:  female   DOB:  [birth year]    MRN:  [id number]    Room:  534/01      Presenting Complaint: Cough      Reason(s) for Admission: CAP (community acquired pneumonia) [J18.9]   Sickle cell crisis [facility]) [D57.00]         Hospital Course & Interval History:     [name] Bright is a 44 y.o. female with medical history of sickle cell disease, asplenia, SVC occlusion, h/o DVT/PE on Xarelto, Seizure Disorder, GERD,  Depression/Anxiety who presented with cough and pain in rib cage that started this past Monday. 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        Subjective (11/23/20 ):   Cough +           Assessment & Plan:        Principal Problem:     #CAP    - afebrile, HDS without signs of sepsis   - CT chest showing RML infiltrate   - COVID and flu negative   -cont Levaquin 750mg  PO daily    ??   #Sickle cell crisis    - start aggressive IVF hydration   - start dilaudid and norco prn    - start zofran prn for nausea   - cont home hydrea, folic acid   - no ACS at this time as patient afebrile, satting well on RA   - Hematology consult appreciated   ??   ??    #Seizure disorder     - cont home Keppra BID   ??   #Hypercoagulable state   #H/o DVT/PE and SVC occlusion     - cont home xarelto daily   - monitor for severe bleeding with frequent epistaxis   ??   #GERD   - start pepcid BID   ??    #Anxiety/Depression   - cont home prozac     Dispo/Discharge Planning:     Home tomorrow if hgb stable      Diet:  ADULT DIET Regular   DVT PPx: xarelto   Code status: Full Code         Hospital Problems  as of 11/23/2020  Date Reviewed:  03/04/2020                         Codes  Class  Noted - Resolved  POA              Sickle cell crisis (HCC)  ICD-10-CM: D57.00   ICD-9-CM:  282.62    11/21/2020 - Present  Unknown                        * (Principal) CAP (community acquired pneumonia)  ICD-10-CM: J18.9   ICD-9-CM: 486    11/21/2020 - Present  Unknown                        Seizure disorder (HCC)  ICD-10-CM: G40.909   ICD-9-CM: 345.90    08/16/2017 - Present  Yes                        Hypercoagulable state (HCC)  ICD-10-CM: Z61.09   ICD-9-CM: 289.81    08/16/2017 - Present  Yes                        Sickle cell disease (HCC)  ICD-10-CM: D57.1   ICD-9-CM: 282.60    08/15/2017 - Present  Yes                            Objective:        Patient Vitals for the past 24 hrs:            Temp  Pulse  Resp  BP  SpO2            11/23/20 1132  98.3 ??F (36.8 ??C)  84  16  102/63  94 %            11/23/20 0751  98.6 ??F (37 ??C)  76  16  108/63  93 %     11/23/20 0423  99.1 ??F (37.3 ??C)  82  14  110/69  93 %     11/23/20 0015  98 ??F (36.7 ??C)  82  14  115/79  93 %     11/22/20 1941  98.1 ??F (36.7 ??C)  84  14  115/77  92 %            11/22/20 1540  98.3 ??F (36.8 ??C)  87  --  102/61  94 %        Oxygen Therapy   O2 Sat (%): 94 % (11/23/20 1132)   Pulse via Oximetry: 82 beats per minute (11/21/20 1331)   O2 Device: None (Room air) (11/21/20 1118)      Estimated body mass index is 25.21 kg/m?? as calculated from the following:     Height as of this encounter: 5\' 8"  (1.727 m).     Weight as of this encounter: 75.2 kg (165 lb 12.8 oz).   No intake or output data in the 24 hours ending 11/23/20 1452        Physical Exam:       Blood pressure 102/63, pulse 84, temperature 98.3 ??F (36.8 ??C), resp. rate 16, height 5\' 8"  (1.727 m), weight 75.2 kg  (165 lb 12.8 oz), SpO2 94 %.   General:    Well nourished.  No overt distress   Head:  Normocephalic, atraumatic   Eyes:  Sclerae appear normal.  Pupils equally round.   ENT:  Nares appear normal, no drainage.  Moist oral mucosa   Neck:  No restricted ROM.  Trachea midline    CV:   RRR.  No m/r/g.  No jugular venous distension.   Lungs:   CTAB.  No wheezing, rhonchi, or rales.  Respirations even, unlabored   Abdomen:   Bowel sounds present.  Soft, nontender, nondistended.   Extremities: No cyanosis or clubbing.  No edema   Skin:     No rashes and normal coloration.   Warm and dry.     Neuro:  CN II-XII grossly intact.   Sensation intact.  A&Ox3   Psych:  Normal mood and affect.        I have reviewed ordered lab tests and independently visualized imaging below:      Recent Labs:     Recent Results (from the past 48 hour(s))     METABOLIC PANEL, BASIC          Collection Time: 11/22/20  6:09 AM         Result  Value  Ref Range            Sodium  142  136 - 145 mmol/L       Potassium  4.0  3.5 - 5.1 mmol/L       Chloride  112 (H)  98 - 107 mmol/L       CO2  23  21 - 32 mmol/L       Anion gap  7  7 - 16 mmol/L       Glucose  127 (H)  65 - 100 mg/dL       BUN  7  6 - 23 MG/DL       Creatinine  1.61 (H)  0.6 - 1.0 MG/DL       GFR est AA  >09  >60 ml/min/1.58m2       GFR est non-AA  >60  >60 ml/min/1.73m2       Calcium  8.9  8.3 - 10.4 MG/DL       CBC W/O DIFF          Collection Time: 11/22/20  6:09 AM         Result  Value  Ref Range            WBC  6.2  4.3 - 11.1 K/uL       RBC  2.20 (L)  4.05 - 5.2 M/uL       HGB  8.2 (L)  11.7 - 15.4 g/dL       HCT  60.4 (L)  54.0 - 46.3 %       MCV  104.1 (H)  79.6 - 97.8 FL       MCH  37.3 (H)  26.1 - 32.9 PG       MCHC  35.8 (H)  31.4 - 35.0 g/dL       RDW  98.1 (H)  19.1 - 14.6 %       PLATELET  333  150 - 450 K/uL       MPV  8.9 (L)  9.4 - 12.3 FL       ABSOLUTE NRBC  0.62 (H)  0.0 - 0.2 K/uL       CBC W/O DIFF          Collection Time: 11/23/20  5:06 AM         Result  Value   Ref Range            WBC  6.7  4.3 - 11.1 K/uL       RBC  2.10 (L)  4.05 - 5.2 M/uL       HGB  8.2 (L)  11.7 - 15.4 g/dL  HCT  22.6 (L)  35.8 - 46.3 %       MCV  107.6 (H)  79.6 - 97.8 FL       MCH  39.0 (H)  26.1 - 32.9 PG       MCHC  36.3 (H)  31.4 - 35.0 g/dL       RDW  53.9 (H)  76.7 - 14.6 %       PLATELET  365  150 - 450 K/uL       MPV  9.2 (L)  9.4 - 12.3 FL       ABSOLUTE NRBC  0.98 (H)  0.0 - 0.2 K/uL       METABOLIC PANEL, BASIC          Collection Time: 11/23/20  5:06 AM         Result  Value  Ref Range            Sodium  140  136 - 145 mmol/L       Potassium  3.9  3.5 - 5.1 mmol/L       Chloride  110 (H)  98 - 107 mmol/L       CO2  23  21 - 32 mmol/L       Anion gap  7  7 - 16 mmol/L       Glucose  96  65 - 100 mg/dL       BUN  8  6 - 23 MG/DL       Creatinine  3.41  0.6 - 1.0 MG/DL       GFR est AA  >93  >60 ml/min/1.70m2       GFR est non-AA  >60  >60 ml/min/1.24m2       Calcium  9.2  8.3 - 10.4 MG/DL       LD          Collection Time: 11/23/20  5:06 AM         Result  Value  Ref Range            LD  361 (H)  100 - 190 U/L       RETICULOCYTE COUNT          Collection Time: 11/23/20  5:06 AM         Result  Value  Ref Range            Reticulocyte count  9.8 (H)  0.3 - 2.0 %       Absolute Retic Cnt.  0.2060 (H)  0.026 - 0.095 M/ul       Immature Retic Fraction  23.9 (H)  3.0 - 15.9 %            Retic Hgb Conc.  35  29 - 35 pg             All Micro Results               Procedure  Component  Value  Units  Date/Time           COVID-19 RAPID TEST [[id number]]  Collected: 11/21/20 0033            Order Status: Completed  Specimen: Nasopharyngeal  Updated: 11/21/20 0108                Specimen source  Nasopharyngeal              COVID-19 rapid test  Not detected  Comment:        The specimen is NEGATIVE for SARS-CoV-2, the novel coronavirus associated with COVID-19.   A negative result does not rule out COVID-19.         This test has been authorized by the FDA under an Emergency Use  Authorization (EUA) for use by authorized laboratories.          Fact sheet for Healthcare Providers: FlickSafe.gl   Fact sheet for Patients: [place].at         Methodology: Isothermal Nucleic Acid Amplification                        INFLUENZA A & B AG (RAPID TEST) [[id number]]  Collected: 11/21/20 0033            Order Status: Completed  Specimen: Nasopharyngeal from Nasal washing  Updated: 11/21/20 0107                Influenza A Ag  Negative                  Comment:  NEGATIVE FOR THE PRESENCE OF INFLUENZA A ANTIGEN   INFECTION DUE TO INFLUENZA A CANNOT BE RULED OUT.   BECAUSE THE ANTIGEN PRESENT IN THE SAMPLE MAY BE BELOW   THE DETECTION LIMIT OF THE TEST.   A NEGATIVE TEST IS PRESUMPTIVE AND IT IS RECOMMENDED THAT THESE RESULTS BE CONFIRMED BY VIRAL CULTURE OR AN FDA-CLEARED INFLUENZA A AND B MOLECULAR ASSAY.                             Influenza B Ag  Negative                  Comment:  NEGATIVE FOR THE PRESENCE OF INFLUENZA B ANTIGEN   INFECTION DUE TO INFLUENZA B CANNOT BE RULED OUT.   BECAUSE THE ANTIGEN PRESENT IN THE SAMPLE MAY BE BELOW   THE DETECTION LIMIT OF THE TEST.   A NEGATIVE TEST IS PRESUMPTIVE AND IT IS RECOMMENDED THAT THESE RESULTS BE CONFIRMED BY VIRAL CULTURE OR AN FDA-CLEARED INFLUENZA A AND B MOLECULAR ASSAY.                             Source  Nasopharyngeal                     Other Studies:   No results found.      Current Meds:     Current Facility-Administered Medications          Medication  Dose  Route  Frequency           ?  HYDROcodone-homatropine (HYCODAN) 5-1.5 mg/5 mL (5 mL) oral solution 5 mL   5 mL  Oral  Q4H PRN     ?  sodium chloride (NS) flush 5-40 mL   5-40 mL  IntraVENous  Q8H     ?  sodium chloride (NS) flush 5-40 mL   5-40 mL  IntraVENous  PRN     ?  acetaminophen (TYLENOL) tablet 650 mg   650 mg  Oral  Q4H PRN     ?  HYDROcodone-acetaminophen (NORCO) 5-325 mg per tablet 1 Tablet   1 Tablet  Oral  Q4H PRN            ?  naloxone (NARCAN) injection 0.4 mg  0.4 mg  IntraVENous  PRN           ?  albuterol (PROVENTIL VENTOLIN) nebulizer solution 2.5 mg   2.5 mg  Nebulization  Q4H PRN     ?  LORazepam (ATIVAN) tablet 0.5-1 mg   0.5-1 mg  Oral  Q6H PRN     ?  senna-docusate (PERICOLACE) 8.6-50 mg per tablet 2 Tablet   2 Tablet  Oral  BID PRN     ?  levoFLOXacin (LEVAQUIN) tablet 750 mg   750 mg  Oral  DAILY     ?  ondansetron (ZOFRAN) injection 4 mg   4 mg  IntraVENous  Q4H PRN     ?  promethazine (PHENERGAN) with saline injection 12.5 mg   12.5 mg  IntraVENous  Q6H PRN     ?  0.9% sodium chloride infusion   100 mL/hr  IntraVENous  CONTINUOUS     ?  cyanocobalamin tablet 500 mcg   500 mcg  Oral  DAILY     ?  FLUoxetine (PROzac) capsule 40 mg   40 mg  Oral  DAILY     ?  folic acid (FOLVITE) tablet 1 mg   1 mg  Oral  DAILY     ?  hydroxyurea (HYDREA) chemo cap 1,000 mg   1,000 mg  Oral  DAILY     ?  levETIRAcetam (KEPPRA) tablet 1,000 mg   1,000 mg  Oral  BID     ?  rivaroxaban (XARELTO) tablet 20 mg   20 mg  Oral  DAILY WITH BREAKFAST     ?  famotidine (PEPCID) tablet 20 mg   20 mg  Oral  BID           ?  HYDROmorphone (DILAUDID) injection 0.5 mg   0.5 mg  IntraVENous  Q3H PRN           Signed:   Lutricia Horsfallamodhar Nickolaus Bordelon, MD      Part of this note may have been written by using a voice dictation software.  The note has been proof read but may still contain some grammatical/other typographical errors.

## 2020-11-23 NOTE — Progress Notes (Signed)
Problem: Falls - Risk of  Goal: *Absence of Falls  Description: Document Schmid Fall Risk and appropriate interventions in the flowsheet.  Outcome: Progressing Towards Goal  Note: Fall Risk Interventions:            Medication Interventions: Teach patient to arise slowly,Patient to call before getting OOB         History of Falls Interventions: Room close to nurse's station         Problem: Patient Education: Go to Patient Education Activity  Goal: Patient/Family Education  Outcome: Progressing Towards Goal

## 2020-11-24 LAB — CBC W/O DIFF
ABSOLUTE NRBC: 1.48 10*3/uL — ABNORMAL HIGH (ref 0.0–0.2)
HCT: 22 % — ABNORMAL LOW (ref 35.8–46.3)
HGB: 7.8 g/dL — ABNORMAL LOW (ref 11.7–15.4)
MCH: 38.6 PG — ABNORMAL HIGH (ref 26.1–32.9)
MCHC: 35.5 g/dL — ABNORMAL HIGH (ref 31.4–35.0)
MCV: 108.9 FL — ABNORMAL HIGH (ref 79.6–97.8)
MPV: 9.5 FL (ref 9.4–12.3)
PLATELET: 260 10*3/uL (ref 150–450)
RBC: 2.02 M/uL — ABNORMAL LOW (ref 4.05–5.2)
RDW: 19 % — ABNORMAL HIGH (ref 11.9–14.6)
WBC: 7.2 10*3/uL (ref 4.3–11.1)

## 2020-11-24 LAB — METABOLIC PANEL, BASIC
Anion gap: 5 mmol/L — ABNORMAL LOW (ref 7–16)
BUN: 8 MG/DL (ref 6–23)
CO2: 23 mmol/L (ref 21–32)
Calcium: 8.8 MG/DL (ref 8.3–10.4)
Chloride: 113 mmol/L — ABNORMAL HIGH (ref 98–107)
Creatinine: 0.86 MG/DL (ref 0.6–1.0)
GFR est AA: >60 mL/min/{1.73_m2} (ref >60)
GFR est non-AA: >60 mL/min/{1.73_m2} (ref >60)
Glucose: 93 mg/dL (ref 65–100)
Potassium: 4.3 mmol/L (ref 3.5–5.1)
Sodium: 141 mmol/L (ref 136–145)

## 2020-11-24 LAB — BASIC METABOLIC PANEL
Anion Gap: 5 mmol/L — ABNORMAL LOW (ref 7–16)
BUN: 8 MG/DL (ref 6–23)
CO2: 23 mmol/L (ref 21–32)
Calcium: 8.8 MG/DL (ref 8.3–10.4)
Chloride: 113 mmol/L — ABNORMAL HIGH (ref 98–107)
Creatinine: 0.86 MG/DL (ref 0.6–1.0)
EGFR IF NonAfrican American: >60 mL/min/{1.73_m2} (ref >60)
GFR African American: >60 mL/min/{1.73_m2} (ref >60)
Glucose: 93 mg/dL (ref 65–100)
Potassium: 4.3 mmol/L (ref 3.5–5.1)
Sodium: 141 mmol/L (ref 136–145)

## 2020-11-24 LAB — CBC
Hematocrit: 22 % — ABNORMAL LOW (ref 35.8–46.3)
Hemoglobin: 7.8 g/dL — ABNORMAL LOW (ref 11.7–15.4)
MCH: 38.6 PG — ABNORMAL HIGH (ref 26.1–32.9)
MCHC: 35.5 g/dL — ABNORMAL HIGH (ref 31.4–35.0)
MCV: 108.9 FL — ABNORMAL HIGH (ref 79.6–97.8)
MPV: 9.5 FL (ref 9.4–12.3)
NRBC Absolute: 1.48 10*3/uL — ABNORMAL HIGH (ref 0.0–0.2)
Platelets: 260 10*3/uL (ref 150–450)
RBC: 2.02 M/uL — ABNORMAL LOW (ref 4.05–5.2)
RDW: 19 % — ABNORMAL HIGH (ref 11.9–14.6)
WBC: 7.2 10*3/uL (ref 4.3–11.1)

## 2020-11-24 MED ORDER — LEVOFLOXACIN 750 MG TAB
750 mg | ORAL_TABLET | Freq: Every day | ORAL | 0 refills | Status: AC
Start: 2020-11-24 — End: 2020-11-26

## 2020-11-24 MED FILL — CYANOCOBALAMIN 1,000 MCG TAB: 1000 mcg | ORAL | Qty: 1

## 2020-11-24 MED FILL — HYDROMORPHONE (PF) 1 MG/ML IJ SOLN: 1 mg/mL | INTRAMUSCULAR | Qty: 1

## 2020-11-24 MED FILL — LEVETIRACETAM 500 MG TAB: 500 mg | ORAL | Qty: 2

## 2020-11-24 MED FILL — FAMOTIDINE 20 MG TAB: 20 mg | ORAL | Qty: 1

## 2020-11-24 MED FILL — FLUOXETINE 20 MG CAP: 20 mg | ORAL | Qty: 2

## 2020-11-24 MED FILL — FOLIC ACID 1 MG TAB: 1 mg | ORAL | Qty: 1

## 2020-11-24 MED FILL — HYDROCODONE-ACETAMINOPHEN 5 MG-325 MG TAB: 5-325 mg | ORAL | Qty: 1

## 2020-11-24 MED FILL — LEVOFLOXACIN 250 MG TAB: 250 mg | ORAL | Qty: 1

## 2020-11-24 MED FILL — XARELTO 10 MG TABLET: 10 mg | ORAL | Qty: 2

## 2020-11-24 MED FILL — HYCODAN 5 MG-1.5 MG/5 ML (5 ML) ORAL SYRUP: ORAL | Qty: 5

## 2020-11-24 MED FILL — HYDROXYUREA 500 MG CAPSULE: 500 mg | ORAL | Qty: 2

## 2020-11-24 NOTE — Progress Notes (Signed)
Discharge information completed, pt instructed med were called to her pharmacy.  She denies any questions.  Pt escort via w/c, condition appear stable.

## 2020-11-24 NOTE — Discharge Summary (Signed)
Discharge Summary by Verdis Prime, MD at 11/24/20 1731                Author: Verdis Prime, MD  Service: Internal Medicine  Author Type: Physician       Filed: 11/24/20 1815  Date of Service: 11/24/20 1731  Status: Signed          Editor: Verdis Prime, MD (Physician)                      Hospitalist Discharge Summary     Admit Date:  11/20/2020 11:18 PM    DC Note date: 11/24/2020   Name:  [name] Bright    Age:  44 y.o.   Sex:  female   DOB:  February 01, 1976    MRN:  [id number]    Room:  534/01   PCP:  Other, Phys, MD      Presenting Complaint: Cough      Initial Admission Diagnosis: CAP (community acquired pneumonia) [J18.9]   Sickle cell crisis (HCC) [D57.00]       Problem List for this Hospitalization:      Hospital Problems  as of 11/24/2020  Date Reviewed:  03/04/2020                         Codes  Class  Noted - Resolved  POA              Sickle cell crisis (HCC)  ICD-10-CM: D57.00   ICD-9-CM: 282.62    11/21/2020 - Present  Unknown                        * (Principal) CAP (community acquired pneumonia)  ICD-10-CM: J18.9   ICD-9-CM: 486    11/21/2020 - Present  Unknown                        Seizure disorder (HCC)  ICD-10-CM: G40.909   ICD-9-CM: 345.90    08/16/2017 - Present  Yes                        Hypercoagulable state (HCC)  ICD-10-CM: H47.42   ICD-9-CM: 289.81    08/16/2017 - Present  Yes                        Sickle cell disease (HCC)  ICD-10-CM: D57.1   ICD-9-CM: 282.60    08/15/2017 - Present  Yes                       Did Patient have Sepsis (YES OR NO): no      Hospital Course: [name] Bright??is a 44 y.o.??female??with  medical history of sickle cell disease, asplenia, SVC occlusion, h/o DVT/PE on Xarelto, Seizure Disorder, GERD, Depression/Anxiety??who presented with cough and pain in rib cage that started 3 days back. 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 She was treated  with antibiotics.  Hemoglobin is stable 7.8 and no signs of bleeding.  patient is hemodynamically stable for discharge.    ??      Disposition: Home or Self Care   Diet: ADULT DIET Regular   Code Status: Full Code      Follow Up Orders:  Follow-up Appointments       Procedures        ?  FOLLOW UP VISIT Appointment in: 3 - 5 Days Follow-up with PCP in 3 to 5 days Follow-up with heme oncologist in 7 to 10 days             Follow-up with PCP in 3 to 5 days   Follow-up with heme oncologist in 7 to 10 days              Standing Status:    Standing         Number of Occurrences:    1         Order Specific Question:    Appointment in              Answer:    3 - 5 Days             Follow-up Information               Follow up With  Specialties  Details  Why  Contact Info              [facility] EMERGENCY DEPT  Emergency Medicine    If symptoms worsen  [address]   [place] [address]   [phone]              [facility] [place]      Follow-up with this or other PCP in 2 days  [address]   [place] [address]   [phone]       Other, Phys, MD        Patient can only remember the practice name and not the physician                       Please follow up with PCP 3-5 days.  Follow up with heme oncologist with in 7 days.                  Follow up labs/diagnostics (ultimately defer to outpatient provider):   Follow-up with PCP in 3 to 5 days   Follow-up with heme-onc in 7 to 10 days for sickle cell anemia    continue taking Levaquinfor 3 more days         Time spent in patient discharge and coordination 35 minutes.      Plan was discussed with patient.  all questions answered.  Patient was stable at time of discharge.  Instructions given to call a physician or  return if any concerns.      Discharge Info:      Discharge Medication List as of 11/24/2020  4:39 PM              START taking these medications          Details        levoFLOXacin (LEVAQUIN) 750 mg  tablet  Take 1 Tablet by mouth daily for 1 dose., Normal, Disp-1 Tablet, R-0               azithromycin (Zithromax Z-Pak) 250 mg tablet  2 pills today 1 pill every day for 4 days., Print, Disp-6 Tablet, R-0               cefdinir (OMNICEF) 300 mg capsule  Take 1 Capsule by mouth two (2) times a day., Print, Disp-20 Capsule, R-0  dextromethorphan (Delsym) 30 mg/5 mL liquid  Take 10 mL by mouth two (2) times a day for 10 days., Normal, Disp-200 mL, R-0               predniSONE (STERAPRED DS) 10 mg dose pack  Please take as directed on package. Please clarify any questions with the Pharmacist., Print, Disp-21 Tablet, R-0                     CONTINUE these medications which have NOT CHANGED          Details        cyanocobalamin (Vitamin B-12) 100 mcg tablet  Take 100 mcg by mouth daily., Historical Med               rivaroxaban (Xarelto) 20 mg tab tablet  Take 20 mg by mouth daily (with breakfast)., Historical Med               diphenhydrAMINE-acetaminophen 25-500 mg tab  Take 1 Tab by mouth daily., Historical Med               cetirizine (ZYRTEC) 10 mg tablet  Take  by mouth., Historical Med               diphenhydrAMINE (BENADRYL) 25 mg tablet  Take 25 mg by mouth., Historical Med               FLUoxetine (PROzac) 20 mg capsule  Take 40 mg by mouth daily., Historical Med               pantoprazole (PROTONIX) 40 mg tablet  Take 1 Tab by mouth daily., Print, Disp-30 Tab, R-0               levETIRAcetam (KEPPRA) 750 mg tablet  Take 1,000 mg by mouth two (2) times a day., Historical Med               hydroxyurea (HYDREA) 500 mg capsule  Take 1,000 mg by mouth daily., Historical Med               folic acid (FOLVITE) 1 mg tablet  Take 1 mg by mouth daily., Historical Med                         Procedures done this admission:   * No surgery found *      Consults this admission:   IP CONSULT TO HEMATOLOGY   IP CONSULT TO HEMATOLOGY      Echocardiogram/EKG results:   No results found for this or any previous  visit.            EKG Results               Procedure  720  Value  Units  Date/Time           EKG, 12 LEAD, INITIAL [[id number]]  Collected: 11/21/20 0020       Order Status: Completed  Updated: 11/21/20 1036                Ventricular Rate  73  BPM           Atrial Rate  74  BPM           P-R Interval  145  ms           QRS Duration  83  ms  Q-T Interval  391  ms           QTC Calculation (Bezet)  431  ms           Calculated P Axis  65  degrees           Calculated R Axis  52  degrees           Calculated T Axis  38  degrees                Diagnosis  --             Sinus rhythm   Abnormal R-wave progression, early transition      Confirmed by SENFIELD, JEFFERY ([id number]) on 11/21/2020 10:35:56 AM                   Diagnostic Imaging/Tests:    XR CHEST PA LAT      Result Date: 11/21/2020   EXAM: Chest x-ray. INDICATION: Cough. COMPARISON: Prior chest x-ray on July 16, 2020. TECHNIQUE: Frontal and lateral view chest x-ray. FINDINGS: The lungs are clear. The cardiac size, mediastinal contour and pulmonary vasculature are normal. No pneumothorax  or pleural effusion is seen. There is chronic blunting of the left costophrenic angle. Mild degenerative changes are seen in the spine.       No acute process.      CT CHEST PULMONARY EMBOLISM      Result Date: 11/21/2020   EXAM: CT Chest with IV contrast - PE protocol. INDICATION: Cough and chills. COMPARISON: None. TECHNIQUE: Axial CT images of the chest were obtained after the intravenous injection of 100 mL Isovue 370 CT contrast. Radiation dose reduction techniques  were used for this study.  Our CT scanners use one or all of the following: Automated exposure control, adjustment of the mA and/or kV according to patient size, iterative reconstruction. FINDINGS: - Pleura/pericardium: Within normal limits. - Lungs:  There is minimal patchy infiltrate in the right middle lobe, suspect for early acute pneumonia. The left lung is clear. - Hila/Mediastinum: Contrast was  injected in the right arm. The right brachiocephalic vein is occluded with thrombus. Collateral vessels  are seen along the anterior and right lateral chest wall. - Tracheobronchial tree: Within normal limits. - Aorta/pulmonary arteries: Within normal limits. - Heart: Within normal limits. - Coronary arteries: No coronary artery calcifications. - Chest wall:  Within normal limits. - Spine/bones: No acute process. - Additional comments: Scans through the upper abdomen demonstrate a calcified and shrunken spleen, consistent with the reported history of sickle cell disease.       1. No evidence of pulmonary embolism. 2. Minimal patchy infiltrate in the right middle lobe, suspect for early acute pneumonia. 3. Thrombosed right brachiocephalic vein, with collateral vessels along the anterior and right lateral chest wall, of indeterminate  age. 4. Shrunken and and calcified spleen, consistent with the reported history of sickle cell disease.            All Micro Results               Procedure  Component  Value  Units  Date/Time           COVID-19 RAPID TEST [[id number]]  Collected: 11/21/20 0033            Order Status: Completed  Specimen: Nasopharyngeal  Updated: 11/21/20 0108                Specimen source  Nasopharyngeal  COVID-19 rapid test  Not detected                  Comment:        The specimen is NEGATIVE for SARS-CoV-2, the novel coronavirus associated with COVID-19.   A negative result does not rule out COVID-19.         This test has been authorized by the FDA under an Emergency Use Authorization (EUA) for use by authorized laboratories.          Fact sheet for Healthcare Providers: FlickSafe.gl   Fact sheet for Patients: [place].at         Methodology: Isothermal Nucleic Acid Amplification                        INFLUENZA A & B AG (RAPID TEST) [[id number]]  Collected: 11/21/20 0033            Order Status: Completed  Specimen:  Nasopharyngeal from Nasal washing  Updated: 11/21/20 0107                Influenza A Ag  Negative                  Comment:  NEGATIVE FOR THE PRESENCE OF INFLUENZA A ANTIGEN   INFECTION DUE TO INFLUENZA A CANNOT BE RULED OUT.   BECAUSE THE ANTIGEN PRESENT IN THE SAMPLE MAY BE BELOW   THE DETECTION LIMIT OF THE TEST.   A NEGATIVE TEST IS PRESUMPTIVE AND IT IS RECOMMENDED THAT THESE RESULTS BE CONFIRMED BY VIRAL CULTURE OR AN FDA-CLEARED INFLUENZA A AND B MOLECULAR ASSAY.                             Influenza B Ag  Negative                  Comment:  NEGATIVE FOR THE PRESENCE OF INFLUENZA B ANTIGEN   INFECTION DUE TO INFLUENZA B CANNOT BE RULED OUT.   BECAUSE THE ANTIGEN PRESENT IN THE SAMPLE MAY BE BELOW   THE DETECTION LIMIT OF THE TEST.   A NEGATIVE TEST IS PRESUMPTIVE AND IT IS RECOMMENDED THAT THESE RESULTS BE CONFIRMED BY VIRAL CULTURE OR AN FDA-CLEARED INFLUENZA A AND B MOLECULAR ASSAY.                             Source  Nasopharyngeal                        Labs:  Results:                  BMP, Mg, Phos  Recent Labs         11/24/20   0530  11/23/20   0506  11/22/20   0609      NA  141  140  142      K  4.3  3.9  4.0      CL  113*  110*  112*      CO2  23  23  23       AGAP  5*  7  7      BUN  8  8  7       CREA  0.86  0.93  1.02*      CA  8.8  9.2  8.9  GLU  93  96  127*              CBC  Recent Labs         11/24/20   0530  11/23/20   0506  11/22/20   0609      WBC  7.2  6.7  6.2      RBC  2.02*  2.10*  2.20*      HGB  7.8*  8.2*  8.2*      HCT  22.0*  22.6*  22.9*      PLT  260  365  333              LFT  No results for input(s): ALT, TBIL, AP, TP, ALB, GLOB, AGRAT in the last 72 hours.      No lab exists for component: SGOT, GPT        Cardiac Testing  Lab Results      Component  Value  Date/Time        Troponin-I, Qt.  <0.02 (L)  08/16/2017 06:03 AM        Troponin-I, Qt.  <0.02 (L)  08/15/2017 06:58 PM                 Coagulation Tests  No results found for: PTP, INR, APTT, INREXT     A1c  No results  found for: HBA1C, HBA1CEXT     Lipid Panel  No results found for: CHOL, CHOLPOCT, CHOLX, CHLST, CHOLV, 884269, HDL, HDLP, LDL, LDLC, DLDLP, 804564, VLDLC, VLDL, TGLX, TRIGL, TRIGP, TGLPOCT, CHHD,  CHHDX     Thyroid Panel  No results found for: TSH, T4, FT4, TT3, T3U, TSHEXT          Most Recent UA  Lab Results      Component  Value  Date/Time        Color  YELLOW  08/16/2017 07:15 AM        Appearance  CLEAR  08/16/2017 07:15 AM        Specific gravity  1.012  08/16/2017 07:15 AM        pH (UA)  6.5  08/16/2017 07:15 AM        Protein  NEGATIVE   08/16/2017 07:15 AM        Glucose  NEGATIVE   08/16/2017 07:15 AM        Ketone  NEGATIVE   08/16/2017 07:15 AM        Bilirubin  NEGATIVE   08/16/2017 07:15 AM        Blood  LARGE (A)  08/16/2017 07:15 AM        Urobilinogen  0.2  08/16/2017 07:15 AM        Nitrites  NEGATIVE   08/16/2017 07:15 AM        Leukocyte Esterase  NEGATIVE   08/16/2017 07:15 AM        WBC  0-3  11/21/2020 02:10 AM        RBC  0-3  11/21/2020 02:10 AM        Epithelial cells  0-3  11/21/2020 02:10 AM        Bacteria  TRACE  11/21/2020 02:10 AM        Casts  0-3  11/21/2020 02:10 AM                    All Labs from Last 24 Hrs:     Recent Results (from the past  24 hour(s))     METABOLIC PANEL, BASIC          Collection Time: 11/24/20  5:30 AM         Result  Value  Ref Range            Sodium  141  136 - 145 mmol/L       Potassium  4.3  3.5 - 5.1 mmol/L       Chloride  113 (H)  98 - 107 mmol/L       CO2  23  21 - 32 mmol/L       Anion gap  5 (L)  7 - 16 mmol/L       Glucose  93  65 - 100 mg/dL       BUN  8  6 - 23 MG/DL       Creatinine  5.99  0.6 - 1.0 MG/DL            GFR est AA  >60  >60 ml/min/1.69m2            GFR est non-AA  >60  >60 ml/min/1.43m2       Calcium  8.8  8.3 - 10.4 MG/DL       CBC W/O DIFF          Collection Time: 11/24/20  5:30 AM         Result  Value  Ref Range            WBC  7.2  4.3 - 11.1 K/uL       RBC  2.02 (L)  4.05 - 5.2 M/uL       HGB  7.8 (L)  11.7 - 15.4 g/dL        HCT  35.7 (L)  01.7 - 46.3 %       MCV  108.9 (H)  79.6 - 97.8 FL       MCH  38.6 (H)  26.1 - 32.9 PG       MCHC  35.5 (H)  31.4 - 35.0 g/dL       RDW  79.3 (H)  90.3 - 14.6 %       PLATELET  260  150 - 450 K/uL       MPV  9.5  9.4 - 12.3 FL            ABSOLUTE NRBC  1.48 (H)  0.0 - 0.2 K/uL           Current Med List in Hospital:      Current Facility-Administered Medications          Medication  Dose  Route  Frequency           ?  HYDROcodone-homatropine (HYCODAN) 5-1.5 mg/5 mL (5 mL) oral solution 5 mL   5 mL  Oral  Q4H PRN     ?  sodium chloride (NS) flush 5-40 mL   5-40 mL  IntraVENous  Q8H     ?  sodium chloride (NS) flush 5-40 mL   5-40 mL  IntraVENous  PRN     ?  acetaminophen (TYLENOL) tablet 650 mg   650 mg  Oral  Q4H PRN     ?  HYDROcodone-acetaminophen (NORCO) 5-325 mg per tablet 1 Tablet   1 Tablet  Oral  Q4H PRN     ?  naloxone (NARCAN) injection 0.4 mg   0.4 mg  IntraVENous  PRN     ?  albuterol (PROVENTIL VENTOLIN) nebulizer solution 2.5 mg   2.5 mg  Nebulization  Q4H PRN     ?  LORazepam (ATIVAN) tablet 0.5-1 mg   0.5-1 mg  Oral  Q6H PRN     ?  senna-docusate (PERICOLACE) 8.6-50 mg per tablet 2 Tablet   2 Tablet  Oral  BID PRN     ?  levoFLOXacin (LEVAQUIN) tablet 750 mg   750 mg  Oral  DAILY     ?  ondansetron (ZOFRAN) injection 4 mg   4 mg  IntraVENous  Q4H PRN     ?  promethazine (PHENERGAN) with saline injection 12.5 mg   12.5 mg  IntraVENous  Q6H PRN     ?  0.9% sodium chloride infusion   100 mL/hr  IntraVENous  CONTINUOUS     ?  cyanocobalamin tablet 500 mcg   500 mcg  Oral  DAILY     ?  FLUoxetine (PROzac) capsule 40 mg   40 mg  Oral  DAILY     ?  folic acid (FOLVITE) tablet 1 mg   1 mg  Oral  DAILY     ?  hydroxyurea (HYDREA) chemo cap 1,000 mg   1,000 mg  Oral  DAILY     ?  levETIRAcetam (KEPPRA) tablet 1,000 mg   1,000 mg  Oral  BID     ?  rivaroxaban (XARELTO) tablet 20 mg   20 mg  Oral  DAILY WITH BREAKFAST     ?  famotidine (PEPCID) tablet 20 mg   20 mg  Oral  BID           ?   HYDROmorphone (DILAUDID) injection 0.5 mg   0.5 mg  IntraVENous  Q3H PRN          Current Outpatient Medications        Medication  Sig         ?  [START ON 11/25/2020] levoFLOXacin (LEVAQUIN) 750 mg tablet  Take 1 Tablet by mouth daily for 1 dose.     ?  azithromycin (Zithromax Z-Pak) 250 mg tablet  2 pills today 1 pill every day for 4 days.     ?  cefdinir (OMNICEF) 300 mg capsule  Take 1 Capsule by mouth two (2) times a day.     ?  dextromethorphan (Delsym) 30 mg/5 mL liquid  Take 10 mL by mouth two (2) times a day for 10 days.     ?  predniSONE (STERAPRED DS) 10 mg dose pack  Please take as directed on package. Please clarify any questions with the Pharmacist.     ?  cyanocobalamin (Vitamin B-12) 100 mcg tablet  Take 100 mcg by mouth daily.     ?  rivaroxaban (Xarelto) 20 mg tab tablet  Take 20 mg by mouth daily (with breakfast).     ?  diphenhydrAMINE-acetaminophen 25-500 mg tab  Take 1 Tab by mouth daily.     ?  cetirizine (ZYRTEC) 10 mg tablet  Take  by mouth.     ?  diphenhydrAMINE (BENADRYL) 25 mg tablet  Take 25 mg by mouth.     ?  FLUoxetine (PROzac) 20 mg capsule  Take 40 mg by mouth daily.     ?  pantoprazole (PROTONIX) 40 mg tablet  Take 1 Tab by mouth daily.     ?  levETIRAcetam (KEPPRA) 750 mg tablet  Take 1,000 mg by mouth two (2) times a day.     ?  hydroxyurea (HYDREA) 500 mg capsule  Take 1,000 mg by mouth daily.         ?  folic acid (FOLVITE) 1 mg tablet  Take 1 mg by mouth daily.             Allergies        Allergen  Reactions         ?  Latex, Natural Rubber  Rash     ?  Rocephin [Ceftriaxone]  Other (comments)     ?  Demerol [Meperidine]  Seizures     ?  Fentanyl  Other (comments)             "my doctor said I had a stroke from it"         ?  Flu Vaccine 2011 (36 Mos+)(Pf)  Swelling     ?  Morphine  Itching         ?  Oxycontin [Oxycodone]  Vertigo           There is no immunization history on file for this patient.      Recent Vital Data:   Patient Vitals for the past 24 hrs:             Temp  Pulse  Resp  BP  SpO2            11/24/20 1102  98 ??F (36.7 ??C)  86  --  (!) 96/58  93 %            11/24/20 0812  98.3 ??F (36.8 ??C)  80  --  99/64  95 %     11/24/20 0328  98.5 ??F (36.9 ??C)  82  18  108/64  93 %     11/23/20 2304  98.7 ??F (37.1 ??C)  81  20  104/71  93 %            11/23/20 1954  98.3 ??F (36.8 ??C)  80  18  (!) 103/55  94 %        Oxygen Therapy   O2 Sat (%): 93 % (11/24/20 1102)   Pulse via Oximetry: 82 beats per minute (11/21/20 1331)   O2 Device: None (Room air) (11/23/20 2304)      Estimated body mass index is 25.21 kg/m?? as calculated from the following:     Height as of this encounter:  (1.727 m).     Weight as of this encounter: 75.2 kg (165 lb 12.8 oz).   No intake or output data in the 24 hours ending 11/24/20 1810        Physical Exam:      General:    Well nourished.  No overt distress   Head:  Normocephalic, atraumatic   Eyes:  Sclerae appear normal.  Pupils equally round.     HENT:  Nares appear normal, no drainage.  Moist mucous membranes   Neck:  No restricted ROM.  Trachea midline   CV:   RRR.  No m/r/g.  No JVD   Lungs:   CTAB.  No wheezing, rhonchi, or rales.  Even, unlabored   Abdomen:   Soft, nontender, nondistended.     Extremities: Warm and dry.  No cyanosis or clubbing.  No edema.     Skin:     No rashes.  Normal coloration   Neuro:  CN II-XII grossly intact.   Psych:  Normal mood and affect.         Signed:  Humberto Leep, MD      Part of this note may have been written by using a voice dictation software.  The note has been proof read but may still contain some grammatical/other typographical errors.

## 2020-11-24 NOTE — Progress Notes (Signed)
Pt is for discharge home today with no needs/supportive care orders received for CM at this time.  Patient is planning to move to [place] and will resume care there.  Pt will have close follow up PCP    Milestones met    Care Management Interventions  PCP Verified by CM: Yes  Mode of Transport at Discharge: Self  Transition of Care Consult (CM Consult): Discharge Planning  Discharge Durable Medical Equipment: No  Physical Therapy Consult: No  Occupational Therapy Consult: No  Speech Therapy Consult: No  Support Systems: Parent(s)  Confirm Follow Up Transport: Family  The Plan for Transition of Care is Related to the Following Treatment Goals : no further needs  The Patient and/or Patient Representative was Provided with a Choice of Provider and Agrees with the Discharge Plan?: Yes  Name of the Patient Representative Who was Provided with a Choice of Provider and Agrees with the Discharge Plan: patient  Freedom of Choice List was Provided with Basic Dialogue that Supports the Patient's Individualized Plan of Care/Goals, Treatment Preferences and Shares the Quality Data Associated with the Providers?: Yes  Veteran Resource Information Provided?: No  Discharge Location  Discharge Placement: Home with family assistance

## 2020-11-25 LAB — HEMOGLOBIN FRACTIONATION, REFLEX
HEMOGLOBIN E: 0 %
Hemoglobin A1: 0 % — ABNORMAL LOW (ref 96.4–98.8)
Hemoglobin C: 0 %
Hemoglobin F: 16.5 % — ABNORMAL HIGH (ref 0.0–2.0)
Hemoglobin S: 81 % — ABNORMAL HIGH
Hgb Variant: 0 %
Hgb solubility: POSITIVE — AB

## 2020-11-25 LAB — HEMOGLOBIN FRACTIONATION
HEMOGLOBIN A2: 2.5 % (ref 1.8–3.2)
Hemoglobin A2: 2.5 % (ref 1.8–3.2)

## 2021-03-18 ENCOUNTER — Inpatient Hospital Stay
Admit: 2021-03-18 | Discharge: 2021-03-18 | Disposition: A | Discharge status: Home / Self Care | Payer: MEDICARE | Attending: Emergency Medicine

## 2021-03-18 ENCOUNTER — Emergency Department: Admit: 2021-03-18 | Payer: MEDICARE | Primary: Family Medicine

## 2021-03-18 DIAGNOSIS — M542 Cervicalgia: Secondary | ICD-10-CM

## 2021-03-18 MED ORDER — NAPROXEN 500 MG EC TAB, DELAYED RELEASE
500 mg | ORAL_TABLET | Freq: Two times a day (BID) | ORAL | 0 refills | Status: AC
Start: 2021-03-18 — End: ?

## 2021-03-18 MED ORDER — LIDOCAINE 4 % TOPICAL PATCH (12 HOUR DURATION)
4 % | CUTANEOUS | 0 refills | Status: AC
Start: 2021-03-18 — End: ?

## 2021-03-18 MED ORDER — HYDROCODONE-ACETAMINOPHEN 5 MG-325 MG TAB
5-325 mg | ORAL_TABLET | Freq: Three times a day (TID) | ORAL | 0 refills | Status: AC | PRN
Start: 2021-03-18 — End: 2021-03-21

## 2021-03-18 NOTE — ED Notes (Signed)
Patient ambulatory to triage with mask in place. Patient reports neck pain that radiates down her right arm x 3 months. Pt reports she fell a couple months ago and injured it.

## 2021-03-18 NOTE — ED Notes (Signed)
 I have reviewed discharge instructions with the patient.  The patient verbalized understanding.    Patient left ED via Discharge Method: ambulatory to Home with self.    Opportunity for questions and clarification provided.       Patient given 3 scripts.         To continue your aftercare when you leave the hospital, you may receive an automated call from our care team to check in on how you are doing.  This is a free service and part of our promise to provide the best care and service to meet your aftercare needs." If you have questions, or wish to unsubscribe from this service please call [phone].  Thank you for Choosing our [facility] Emergency Department.
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
## 2021-06-14 ENCOUNTER — Inpatient Hospital Stay: Admit: 2021-06-14 | Payer: MEDICARE | Primary: Student in an Organized Health Care Education/Training Program

## 2021-06-14 ENCOUNTER — Inpatient Hospital Stay: Payer: MEDICARE | Primary: Student in an Organized Health Care Education/Training Program

## 2021-06-14 ENCOUNTER — Emergency Department: Admit: 2021-06-14 | Payer: MEDICARE | Primary: Student in an Organized Health Care Education/Training Program

## 2021-06-14 ENCOUNTER — Inpatient Hospital Stay
Admission: EM | Admit: 2021-06-14 | Discharge: 2021-06-19 | Disposition: A | Discharge status: Home / Self Care | Payer: MEDICARE | Admitting: Family Medicine

## 2021-06-14 DIAGNOSIS — N920 Excessive and frequent menstruation with regular cycle: Principal | ICD-10-CM

## 2021-06-14 LAB — EKG 12-LEAD
Atrial Rate: 109 {beats}/min
Atrial Rate: 85 {beats}/min
P Axis: 50 degrees
P Axis: 53 degrees
P-R Interval: 128 ms
P-R Interval: 175 ms
Q-T Interval: 464 ms
Q-T Interval: 381 ms
QRS Duration: 72 ms
QRS Duration: 90 ms
QTc Calculation (Bazett): 624 ms
QTc Calculation (Bazett): 451 ms
R Axis: 21 degrees
R Axis: 16 degrees
T Axis: 47 degrees
T Axis: -8 degrees
Ventricular Rate: 109 {beats}/min
Ventricular Rate: 85 {beats}/min

## 2021-06-14 LAB — BASIC METABOLIC PANEL
Anion Gap: 15 mmol/L (ref 7–16)
BUN: 10 MG/DL (ref 6–23)
CO2: 16 mmol/L — ABNORMAL LOW (ref 21–32)
Calcium: 9 MG/DL (ref 8.3–10.4)
Chloride: 110 mmol/L — ABNORMAL HIGH (ref 98–107)
Creatinine: 1.1 MG/DL — ABNORMAL HIGH (ref 0.6–1.0)
GFR African American: >60 mL/min/{1.73_m2} (ref >60)
GFR Non-African American: 57 mL/min/{1.73_m2} — ABNORMAL LOW (ref >60)
Glucose: 117 mg/dL — ABNORMAL HIGH (ref 65–100)
Potassium: 4.2 mmol/L (ref 3.5–5.1)
Sodium: 141 mmol/L (ref 136–145)

## 2021-06-14 LAB — CBC
Hematocrit: 23.9 % — ABNORMAL LOW (ref 35.8–46.3)
Hemoglobin: 8.4 g/dL — ABNORMAL LOW (ref 11.7–15.4)
MCH: 43.3 PG — ABNORMAL HIGH (ref 26.1–32.9)
MCHC: 35.1 g/dL — ABNORMAL HIGH (ref 31.4–35.0)
MCV: 123.2 FL — ABNORMAL HIGH (ref 79.6–97.8)
MPV: 9.4 FL (ref 9.4–12.3)
Platelets: 385 10*3/uL (ref 150–450)
RBC: 1.94 M/uL — ABNORMAL LOW (ref 4.05–5.2)
RDW: 17.7 % — ABNORMAL HIGH (ref 11.9–14.6)
WBC: 6.9 10*3/uL (ref 4.3–11.1)
nRBC: 0.92 10*3/uL — ABNORMAL HIGH (ref 0.0–0.2)

## 2021-06-14 LAB — RETICULOCYTES
Absolute Retic #: 0.0927 M/ul (ref 0.026–0.095)
Immature Retic Fraction: 28 % — ABNORMAL HIGH (ref 3.0–15.9)
Retic Hemoglobin conc.: 41 pg — ABNORMAL HIGH (ref 29–35)
Reticulocyte Count,Automated: 5 % — ABNORMAL HIGH (ref 0.3–2.0)

## 2021-06-14 LAB — PROTIME-INR
INR: 1.5
Protime: 18.4 s — ABNORMAL HIGH (ref 12.6–14.5)

## 2021-06-14 LAB — D-DIMER, QUANTITATIVE: D-Dimer, Quant: 0.51 ug/ml(FEU) (ref <0.56)

## 2021-06-14 LAB — POC PREGNANCY UR-QUAL: Preg Test, Ur: NEGATIVE

## 2021-06-14 MED ORDER — HEPARIN SODIUM (PORCINE) 1000 UNIT/ML IJ SOLN
1000 | INTRAMUSCULAR | Status: DC | PRN
Start: 2021-06-14 — End: 2021-06-14

## 2021-06-14 MED ORDER — MEDROXYPROGESTERONE ACETATE 10 MG PO TABS
10 MG | Freq: Three times a day (TID) | ORAL | Status: DC
Start: 2021-06-14 — End: 2021-06-18
  Administered 2021-06-14 – 2021-06-18 (×12): 10 mg via ORAL

## 2021-06-14 MED ORDER — HEPARIN SODIUM (PORCINE) 1000 UNIT/ML IJ SOLN
1000 | INTRAMUSCULAR | Status: DC | PRN
Start: 2021-06-14 — End: 2021-06-15
  Administered 2021-06-14: 15:00:00 6170 [IU]/kg via INTRAVENOUS

## 2021-06-14 MED ORDER — ACETAMINOPHEN 325 MG PO TABS
325 MG | Freq: Four times a day (QID) | ORAL | Status: DC | PRN
Start: 2021-06-14 — End: 2021-06-19
  Administered 2021-06-15 – 2021-06-16 (×2): 650 mg via ORAL

## 2021-06-14 MED ORDER — SODIUM CHLORIDE 0.9 % IV BOLUS
0.9 % | INTRAVENOUS | Status: AC
Start: 2021-06-14 — End: 2021-06-14
  Administered 2021-06-14: 10:00:00 1000 mL via INTRAVENOUS

## 2021-06-14 MED ORDER — SODIUM CHLORIDE 0.9 % IV SOLN
0.9 % | INTRAVENOUS | Status: DC
Start: 2021-06-14 — End: 2021-06-15
  Administered 2021-06-15: 04:00:00 via INTRAVENOUS

## 2021-06-14 MED ORDER — HEPARIN SODIUM (PORCINE) 1000 UNIT/ML IJ SOLN
1000 UNIT/ML | INTRAMUSCULAR | Status: DC | PRN
Start: 2021-06-14 — End: 2021-06-15

## 2021-06-14 MED ORDER — HEPARIN SOD (PORCINE) IN D5W 100 UNIT/ML IV SOLN
100 | INTRAVENOUS | Status: DC
Start: 2021-06-14 — End: 2021-06-15
  Administered 2021-06-14: 15:00:00 18 [IU]/kg/h via INTRAVENOUS

## 2021-06-14 MED ORDER — HYDROXYUREA 500 MG PO CAPS
500 MG | Freq: Every day | ORAL | Status: DC
Start: 2021-06-14 — End: 2021-06-19
  Administered 2021-06-15 – 2021-06-19 (×5): 1000 mg via ORAL

## 2021-06-14 MED ORDER — HEPARIN SOD (PORCINE) IN D5W 100 UNIT/ML IV SOLN
100 UNIT/ML | INTRAVENOUS | Status: DC
Start: 2021-06-14 — End: 2021-06-14

## 2021-06-14 MED ORDER — LIDOCAINE 4 % EX PTCH
4 % | Freq: Every day | CUTANEOUS | Status: DC
Start: 2021-06-14 — End: 2021-06-19
  Administered 2021-06-14 – 2021-06-19 (×6): 1 via TRANSDERMAL

## 2021-06-14 MED ORDER — SODIUM CHLORIDE 0.9 % IV SOLN
0.9 | INTRAVENOUS | Status: DC | PRN
Start: 2021-06-14 — End: 2021-06-19

## 2021-06-14 MED ORDER — HEPARIN SODIUM (PORCINE) 1000 UNIT/ML IJ SOLN
1000 UNIT/ML | INTRAMUSCULAR | Status: DC | PRN
Start: 2021-06-14 — End: 2021-06-14

## 2021-06-14 MED ORDER — POLYETHYLENE GLYCOL 3350 17 G PO PACK
17 | Freq: Every day | ORAL | Status: DC | PRN
Start: 2021-06-14 — End: 2021-06-19

## 2021-06-14 MED ORDER — NORMAL SALINE FLUSH 0.9 % IV SOLN
0.9 | INTRAVENOUS | Status: DC | PRN
Start: 2021-06-14 — End: 2021-06-19

## 2021-06-14 MED ORDER — HYDROMORPHONE HCL PF 1 MG/ML IJ SOLN
1 MG/ML | INTRAMUSCULAR | Status: DC | PRN
Start: 2021-06-14 — End: 2021-06-18
  Administered 2021-06-14 – 2021-06-18 (×15): 1 mg via INTRAVENOUS

## 2021-06-14 MED ORDER — NORMAL SALINE FLUSH 0.9 % IV SOLN
0.9 % | Freq: Two times a day (BID) | INTRAVENOUS | Status: DC
Start: 2021-06-14 — End: 2021-06-19
  Administered 2021-06-15 (×2): 10 mL via INTRAVENOUS
  Administered 2021-06-15: 5 mL via INTRAVENOUS
  Administered 2021-06-16 – 2021-06-19 (×6): 10 mL via INTRAVENOUS

## 2021-06-14 MED ORDER — ONDANSETRON HCL 4 MG/2ML IJ SOLN
42 MG/2ML | INTRAMUSCULAR | Status: AC
Start: 2021-06-14 — End: 2021-06-14
  Administered 2021-06-14: 09:00:00 4 mg via INTRAVENOUS

## 2021-06-14 MED ORDER — ONDANSETRON 4 MG PO TBDP
4 MG | Freq: Three times a day (TID) | ORAL | Status: DC | PRN
Start: 2021-06-14 — End: 2021-06-19

## 2021-06-14 MED ORDER — OXYCODONE-ACETAMINOPHEN 5-325 MG PO TABS
5-325 MG | ORAL | Status: DC | PRN
Start: 2021-06-14 — End: 2021-06-19
  Administered 2021-06-15 – 2021-06-18 (×6): 1 via ORAL

## 2021-06-14 MED ORDER — SODIUM CHLORIDE 0.9 % IV BOLUS
0.9 % | INTRAVENOUS | Status: AC
Start: 2021-06-14 — End: 2021-06-14
  Administered 2021-06-14: 12:00:00 1000 mL via INTRAVENOUS

## 2021-06-14 MED ORDER — ONDANSETRON HCL 4 MG/2ML IJ SOLN
4 | Freq: Four times a day (QID) | INTRAMUSCULAR | Status: DC | PRN
Start: 2021-06-14 — End: 2021-06-19
  Administered 2021-06-14 (×2): 4 mg via INTRAVENOUS

## 2021-06-14 MED ORDER — HYDROMORPHONE HCL PF 1 MG/ML IJ SOLN
1 MG/ML | INTRAMUSCULAR | Status: AC
Start: 2021-06-14 — End: 2021-06-14
  Administered 2021-06-14: 09:00:00 1 mg via INTRAVENOUS

## 2021-06-14 MED ORDER — LEVETIRACETAM 500 MG PO TABS
500 MG | Freq: Two times a day (BID) | ORAL | Status: DC
Start: 2021-06-14 — End: 2021-06-19
  Administered 2021-06-14 – 2021-06-19 (×11): 1500 mg via ORAL

## 2021-06-14 MED ORDER — ACETAMINOPHEN 650 MG RE SUPP
650 | Freq: Four times a day (QID) | RECTAL | Status: DC | PRN
Start: 2021-06-14 — End: 2021-06-19

## 2021-06-14 MED FILL — ONDANSETRON HCL 4 MG/2ML IJ SOLN: 4 MG/2ML | INTRAMUSCULAR | Qty: 2

## 2021-06-14 MED FILL — HYDROMORPHONE HCL 1 MG/ML IJ SOLN: 1 mg/mL | INTRAMUSCULAR | Qty: 1

## 2021-06-14 MED FILL — LEVETIRACETAM 500 MG PO TABS: 500 mg | ORAL | Qty: 3

## 2021-06-14 MED FILL — HEPARIN SODIUM (PORCINE) 1000 UNIT/ML IJ SOLN: 1000 [IU]/mL | INTRAMUSCULAR | Qty: 10

## 2021-06-14 MED FILL — SALONPAS PAIN RELIEVING 4 % EX PTCH: 4 % | CUTANEOUS | Qty: 1

## 2021-06-14 MED FILL — HEPARIN SOD (PORCINE) IN D5W 100 UNIT/ML IV SOLN: 100 [IU]/mL | INTRAVENOUS | Qty: 250

## 2021-06-14 MED FILL — HYDREA 500 MG PO CAPS: 500 mg | ORAL | Qty: 2

## 2021-06-14 MED FILL — MEDROXYPROGESTERONE ACETATE 10 MG PO TABS: 10 mg | ORAL | Qty: 1

## 2021-06-14 NOTE — ED Triage Notes (Signed)
Pt ambulatory to triage for reports of heavy vaginal bleeding x 3 days with clots. Pt with hx of sickle cell & blood clots, on xarelto at home. Pt states she is currently on her cycle but it has never been this heavy. Pt endorsing 10/10 pain at this time. Pt a&ox4.

## 2021-06-14 NOTE — H&P (Signed)
Hospitalist History and Physical   Admit Date:  06/14/2021  1:21 AM   Name:  [name] Bright   Age:  45 y.o.  Sex:  female  DOB:  01/19/1976   MRN:  [id number]   Room:  ER03/03    Presenting Complaint: Vaginal Bleeding     Reason(s) for Admission: Acute chest wall pain [R07.89]     History of Present Illness:   [name] Bright is a 45 y.o. 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    C/o  Increase menstrual flow with clots since past 2 days.  Mild dizziness/lightheadedness yesterday, none today  Mild lower abdominal discomfort.  New rt upper chest pain, increased on movement and better at rest.Felt brought by stress.  chronic rt neck pain radiating to rt shoulder with difficulty of abduction movement going on for 1 yr    No shortness of breath or nausea or vomiting.    Hb 8.4,D-dimer 0.51,Pregnancy test negative.  Initial ekg sinus with QTC prolongation, repeat EKG sinus , mild t wave inversion lead lll,normal qtc.      Pt will be  Admitted for menorrhagia, chest wall vs sickle cell crisis      Review of Systems:  10 systems reviewed and negative except as noted in HPI.  Assessment & Plan:   Menorrhagia  Pelvic ultrasound  Gynecology consult    Rt chest wall pain- reproducible via palpation vs sickle cell crisis  D-dimer normal  Cont pain medications, ivf  Cont hydrea 1000 mg. Folic acid levels high.    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 *       Past History:     Past Medical History:   Diagnosis Date   ??? Seizures (HCC)    ??? Sickle cell disease (HCC)    ???  Stroke [facility])      History reviewed. No pertinent surgical history.   Social History     Tobacco Use   ??? Smoking status: Never Smoker   ??? Smokeless tobacco: Never Used   Substance Use Topics   ??? Alcohol use: Never      Social History     Substance and Sexual Activity   Drug Use Never      family history - htn   Family history reviewed and negative except as noted above.        There is no immunization history on file for this patient.  Allergies   Allergen Reactions   ??? Ceftriaxone Other (See Comments)   ??? Fentanyl Other (See Comments)     "my doctor said I had a stroke from it"   ??? Meperidine Other (See Comments)   ??? Morphine Itching   ??? Oxycodone Other (See Comments)     Prior to Admit Medications:  Current Outpatient Medications   Medication Instructions   ??? cefdinir (OMNICEF) 300 mg, Oral, 2 TIMES DAILY   ??? cetirizine (ZYRTEC) 10 MG tablet Oral   ???  cyanocobalamin 100 mcg, Oral, DAILY   ??? diphenhydrAMINE (SOMINEX) 25 mg, Oral   ??? diphenhydrAMINE-APAP, sleep, (TYLENOL PM EXTRA STRENGTH) 25-500 MG tablet 1 tablet, Oral, DAILY   ??? FLUoxetine (PROZAC) 40 mg, Oral, DAILY   ??? folic acid (FOLVITE) 1 mg, Oral, DAILY   ??? hydroxyurea (HYDREA) 1,000 mg, Oral, DAILY   ??? levETIRAcetam (KEPPRA) 1,000 mg, Oral, 2 TIMES DAILY   ??? lidocaine 4 % external patch Apply 1 patch to painful area, leave on for 12 hours, then remove for 12 hours.   ??? naproxen (EC NAPROSYN) 500 mg, Oral, 2 TIMES DAILY WITH MEALS   ??? pantoprazole (PROTONIX) 40 mg, Oral, DAILY   ??? predniSONE 10 MG (21) TBPK Please take as directed on package. Please clarify any questions with the Pharmacist.   ??? rivaroxaban (XARELTO) 20 mg, Oral, DAILY WITH BREAKFAST         Objective:     Patient Vitals for the past 24 hrs:   Temp Pulse Resp BP SpO2   06/14/21 1530 -- -- -- 109/70 94 %   06/14/21 1430 -- -- -- 106/71 96 %   06/14/21 1330 -- -- -- 104/71 94 %   06/14/21 1230 -- -- -- 102/73 95 %   06/14/21 0447 -- -- -- 105/83 98 %   06/14/21 0437 -- -- -- -- 98 %   06/14/21  0407 -- 91 12 -- 97 %   06/14/21 0357 -- 91 15 -- 98 %   06/14/21 0347 -- 95 18 -- 98 %   06/14/21 0337 -- 90 15 -- 98 %   06/14/21 0327 -- 92 17 -- 97 %   06/14/21 0317 -- 89 14 -- 98 %   06/14/21 0207 -- 98 22 -- 98 %   06/14/21 0152 -- 96 15 -- 97 %   06/14/21 0137 -- (!) 101 19 -- 98 %   06/14/21 0112 98.6 ??F (37 ??C) (!) 111 20 (!) 124/93 97 %       Oxygen Therapy  SpO2: 94 %  O2 Device: None (Room air)    Estimated body mass index is 25.85 kg/m?? as calculated from the following:    Height as of this encounter: 5\' 8"  (1.727 m).    Weight as of this encounter: 170 lb (77.1 kg).  No intake or output data in the 24 hours ending 06/14/21 1756      Physical Exam:    Blood pressure 109/70, pulse 91, temperature 98.6 ??F (37 ??C), temperature source Oral, resp. rate 12, height 5\' 8"  (1.727 m), weight 170 lb (77.1 kg), SpO2 94 %.  General:    Well nourished.    Head:  Normocephalic, atraumatic  Eyes:  Sclerae appear normal.  Pupils equally round.  ENT:  Nares appear normal, no drainage.  Moist oral mucosa  Neck:  No restricted ROM.  Trachea midline   CV:   RRR.  No m/r/g.  No jugular venous distension.  Lungs:   CTAB.  No wheezing, rhonchi, or rales.  Symmetric expansion.  Rt sided chest wall pain  Abdomen:   Bowel sounds present.  Soft, nontender, nondistended.  Extremities: Cannot do abduction rt upper extremity above shoulder level.  Musculoskeletal tenderness on palpation rt shoulder, rt arm and rt clavicular region  Skin:     No rashes and normal coloration.   Warm and dry.    Neuro:  CN II-XII grossly intact.  Sensation intact.  A&Ox3  Psych:  Normal mood and  affect.      I have reviewed ordered lab tests and independently visualized imaging below:    Last 24hr Labs:  Recent Results (from the past 24 hour(s))   EKG 12 Lead    Collection Time: 06/14/21  1:13 AM   Result Value Ref Range    Ventricular Rate 109 BPM    Atrial Rate 109 BPM    P-R Interval 128 ms    QRS Duration 72 ms    Q-T Interval 464 ms    QTc  Calculation (Bazett) 624 ms    P Axis 50 degrees    R Axis 21 degrees    T Axis 47 degrees    Diagnosis Sinus tachycardia    Basic Metabolic Panel    Collection Time: 06/14/21  1:29 AM   Result Value Ref Range    Sodium 141 136 - 145 mmol/L    Potassium 4.2 3.5 - 5.1 mmol/L    Chloride 110 (H) 98 - 107 mmol/L    CO2 16 (L) 21 - 32 mmol/L    Anion Gap 15 7 - 16 mmol/L    Glucose 117 (H) 65 - 100 mg/dL    BUN 10 6 - 23 MG/DL    CREATININE 4.16 (H) 0.6 - 1.0 MG/DL    GFR African American >60 >60 ml/min/1.47m2    GFR Non-African American 57 (L) >60 ml/min/1.78m2    Calcium 9.0 8.3 - 10.4 MG/DL   CBC    Collection Time: 06/14/21  1:29 AM   Result Value Ref Range    WBC 6.9 4.3 - 11.1 K/uL    RBC 1.94 (L) 4.05 - 5.2 M/uL    Hemoglobin 8.4 (L) 11.7 - 15.4 g/dL    Hematocrit 60.6 (L) 35.8 - 46.3 %    MCV 123.2 (H) 79.6 - 97.8 FL    MCH 43.3 (H) 26.1 - 32.9 PG    MCHC 35.1 (H) 31.4 - 35.0 g/dL    RDW 30.1 (H) 60.1 - 14.6 %    Platelets 385 150 - 450 K/uL    MPV 9.4 9.4 - 12.3 FL    nRBC 0.92 (H) 0.0 - 0.2 K/uL   D-Dimer, Quantitative    Collection Time: 06/14/21  1:29 AM   Result Value Ref Range    D-Dimer, Quant 0.51 <0.56 ug/ml(FEU)   Protime-INR    Collection Time: 06/14/21  2:45 AM   Result Value Ref Range    Protime 18.4 (H) 12.6 - 14.5 sec    INR 1.5     Reticulocytes    Collection Time: 06/14/21  2:45 AM   Result Value Ref Range    Reticulocyte Count,Automated 5.0 (H) 0.3 - 2.0 %    Absolute Retic # [id number] 0.026 - 0.095 M/ul    Immature Retic Fraction 28.0 (H) 3.0 - 15.9 %    Retic Hemoglobin conc. 41 (H) 29 - 35 pg   EKG 12 Lead    Collection Time: 06/14/21  7:47 AM   Result Value Ref Range    Ventricular Rate 85 BPM    Atrial Rate 85 BPM    P-R Interval 175 ms    QRS Duration 90 ms    Q-T Interval 381 ms    QTc Calculation (Bazett) 451 ms    P Axis 53 degrees    R Axis 16 degrees    T Axis -8 degrees    Diagnosis Sinus rhythm    POC Pregnancy Urine Qual    Collection  Time: 06/14/21  8:47 AM   Result Value Ref Range     Preg Test, Ur Negative NEG         Other Studies:  XR CHEST (2 VW)    Result Date: 06/14/2021  EXAM: XR CHEST (2 VW) HISTORY: sickle cell.  TECHNIQUE: Frontal and lateral chest. COMPARISON: 11/21/2020 FINDINGS: The cardiac silhouette, mediastinum, and pulmonary vasculature are within normal limits. There is no consolidation, pleural effusion, or pneumothorax. No significant osseous abnormalities are observed.     No evidence of an acute intrathoracic process.     US PELVIS COMPLETE    Result Date: 06/14/2021  PELVIC ULTRASOUND. HISTORY:  Pelvic pain TECHNIQUE:  Transabdominal and endovaginal  images were obtained of the pelvis. FINDINGS:  - UTERUS:  7.5 cm in length.  Endometrial stripe measures 13 mm.  Endometrium is slightly heterogeneous.  No discrete myometrial or endometrial mass is seen. -  RIGHT OVARY: Never identified.  No significant right pelvic mass.  -  LEFT OVARY:  3.1 x 1.3 x 2.3 cm.  No significant adnexal mass.  Normal blood flow. No significant free fluid.     Slight heterogeneity of endometrium and borderline thickening. Consider follow-up to exclude residual tumor/hyperplasia.       Echocardiogram:  No results found for this or any previous visit.      Meds previously ordered:  Orders Placed This Encounter   Medications   ??? 0.9 % sodium chloride bolus   ??? HYDROmorphone HCl PF (DILAUDID) injection 1 mg   ??? ondansetron (ZOFRAN) injection 4 mg   ??? 0.9 % sodium chloride bolus   ??? hydroxyurea (HYDREA) chemo capsule 1,000 mg   ??? levETIRAcetam (KEPPRA) tablet 1,500 mg   ??? lidocaine 4 % external patch 1 patch   ??? sodium chloride flush 0.9 % injection 5-40 mL   ??? sodium chloride flush 0.9 % injection 5-40 mL   ??? 0.9 % sodium chloride infusion   ??? OR Linked Order Group    ??? ondansetron (ZOFRAN-ODT) disintegrating tablet 4 mg    ??? ondansetron (ZOFRAN) injection 4 mg   ??? polyethylene glycol (GLYCOLAX) packet 17 g   ??? OR Linked Order Group    ??? acetaminophen (TYLENOL) tablet 650 mg    ??? acetaminophen  (TYLENOL) suppository 650 mg   ??? HYDROmorphone HCl PF (DILAUDID) injection 1 mg   ??? oxyCODONE-acetaminophen (PERCOCET) 5-325 MG per tablet 1 tablet   ??? 0.9 % sodium chloride infusion   ??? DISCONTD: heparin (porcine) injection 6,170 Units   ??? DISCONTD: heparin (porcine) injection 3,080 Units   ??? DISCONTD: heparin 25,000 units in dextrose 5% 250 mL (premix) infusion   ??? heparin (porcine) injection 6,170 Units   ??? heparin (porcine) injection 3,080 Units   ??? heparin 25,000 units in dextrose 5% 250 mL (premix) infusion       Blood Product Consent:  I have discussed with the patient the rationale for blood component transfusion; its benefits in treating or preventing fatigue, organ damage, or death; and its risk which includes mild transfusion reactions, rare risk of blood borne infection, or more serious but rare reactions. I have discussed the alternatives to transfusion, including the risk and consequences of not receiving transfusion. The patient had an opportunity to ask questions and had agreed to proceed with transfusion of blood components.    Signed:  Britta Mccreedy, MD

## 2021-06-14 NOTE — ED Notes (Signed)
Report given to [place], RN     [place], [place]  06/14/21 [phone]

## 2021-06-14 NOTE — Consults (Addendum)
Gynecology Consult Note    Name: [name] Bright MRN: [id number] SSN: [id number]    Date of Birth: 28-Jul-1976  Age: 45 y.o.  Sex: female       Subjective:      Chief complaint:  Abnormal uterine bleeding    [name] Bright is a 45 y.o. African American female G1P1 with a history of abnormal uterine bleeding. She reports her menstrual period began 2 days ago (Sunday) and was heavier than usual. This morning she passed a clot larger than her first. She has continued to pass clots, but not as large. She has heavy periods at baseline, but never this heavy. She has had mild dizziness/lightheadedness yesterday, but does not have that now. She has sickle cell disease and feels this heavy bleeding induced a pain crisis, as she developed right upper chest pain. She reports periods are regular and monthly. She has not missed any periods.  Ultrasound of pelvis noted normal size uterus with EMS of 93mm. Endometrium described as herterogeneous. Previous treatment measures include none. She has not been on birth control in recent years. She had a Norplant many years ago, but was removed after she had a PE. She has not had major menstrual issues after this time, until now. Last pap many years ago, unknown time to patient. Does not see a GYN.     PMH: Sickle cell disease, hx stroke, hx PE. She is currently on anticoagulation with Xarelto.    OB hx: G1 Emergency C/S delivered perterm, Post operative complication - had to have e-lap post op for concern for bowel injury, none found.    The current method of family planning is tubal ligation.    GYN [place] 06/14/21:  HISTORY: ??Pelvic pain   ??   TECHNIQUE: ??Transabdominal and endovaginal ??images were obtained of the pelvis.   ??   FINDINGS: ??   - UTERUS: ??7.5 cm in length. ??Endometrial stripe measures 13 mm. ??Endometrium is   slightly heterogeneous. ??No discrete myometrial or endometrial mass is seen.   ??   - ??RIGHT OVARY: Never identified. ??No significant right pelvic mass. ??   ??   - ??LEFT OVARY:  ??3.1 x 1.3 x 2.3 cm. ??No significant adnexal mass. ??Normal blood   flow.   ??   No significant free fluid.         Past Medical History:   Diagnosis Date   ??? Seizures (HCC)    ??? Sickle cell disease (HCC)    ??? Stroke [facility])      History reviewed. No pertinent surgical history.  OB History    No obstetric history on file.       Allergies   Allergen Reactions   ??? Ceftriaxone Other (See Comments)   ??? Fentanyl Other (See Comments)     "my doctor said I had a stroke from it"   ??? Meperidine Other (See Comments)   ??? Morphine Itching   ??? Oxycodone Other (See Comments)     Current Facility-Administered Medications   Medication Dose Route Frequency Provider Last Rate Last Admin   ??? hydroxyurea (HYDREA) chemo capsule 1,000 mg  1,000 mg Oral Daily Britta Mccreedy, MD       ??? levETIRAcetam (KEPPRA) tablet 1,500 mg  1,500 mg Oral BID Britta Mccreedy, MD   1,500 mg at 06/14/21 1055   ??? lidocaine 4 % external patch 1 patch  1 patch TransDERmal Daily Britta Mccreedy, MD   1 patch at 06/14/21 1058   ??? sodium chloride flush 0.9 %  injection 5-40 mL  5-40 mL IntraVENous 2 times per day Britta MccreedySubhose Bathina, MD       ??? sodium chloride flush 0.9 % injection 5-40 mL  5-40 mL IntraVENous PRN Britta MccreedySubhose Bathina, MD       ??? 0.9 % sodium chloride infusion   IntraVENous PRN Britta MccreedySubhose Bathina, MD       ??? ondansetron (ZOFRAN-ODT) disintegrating tablet 4 mg  4 mg Oral Q8H PRN Britta MccreedySubhose Bathina, MD        Or   ??? ondansetron (ZOFRAN) injection 4 mg  4 mg IntraVENous Q6H PRN Britta MccreedySubhose Bathina, MD   4 mg at 06/14/21 1053   ??? polyethylene glycol (GLYCOLAX) packet 17 g  17 g Oral Daily PRN Britta MccreedySubhose Bathina, MD       ??? acetaminophen (TYLENOL) tablet 650 mg  650 mg Oral Q6H PRN Britta MccreedySubhose Bathina, MD        Or   ??? acetaminophen (TYLENOL) suppository 650 mg  650 mg Rectal Q6H PRN Britta MccreedySubhose Bathina, MD       ??? HYDROmorphone HCl PF (DILAUDID) injection 1 mg  1 mg IntraVENous Q4H PRN Britta MccreedySubhose Bathina, MD   1 mg at 06/14/21 1052   ??? oxyCODONE-acetaminophen (PERCOCET) 5-325 MG per  tablet 1 tablet  1 tablet Oral Q4H PRN Britta MccreedySubhose Bathina, MD       ??? 0.9 % sodium chloride infusion   IntraVENous Continuous Britta MccreedySubhose Bathina, MD       ??? heparin (porcine) injection 6,170 Units  80 Units/kg IntraVENous PRN Britta MccreedySubhose Bathina, MD   6,170 Units at 06/14/21 1049   ??? heparin (porcine) injection 3,080 Units  40 Units/kg IntraVENous PRN Britta MccreedySubhose Bathina, MD       ??? heparin 25,000 units in dextrose 5% 250 mL (premix) infusion  5-30 Units/kg/hr IntraVENous Continuous Britta MccreedySubhose Bathina, MD 13.9 mL/hr at 06/14/21 1050 18 Units/kg/hr at 06/14/21 1050     Current Outpatient Medications   Medication Sig Dispense Refill   ??? cefdinir (OMNICEF) 300 MG capsule Take 300 mg by mouth 2 times daily     ??? cetirizine (ZYRTEC) 10 MG tablet Take by mouth     ??? cyanocobalamin 100 MCG tablet Take 100 mcg by mouth daily     ??? diphenhydrAMINE (SOMINEX) 25 MG tablet Take 25 mg by mouth     ??? diphenhydrAMINE-APAP, sleep, (TYLENOL PM EXTRA STRENGTH) 25-500 MG tablet Take 1 tablet by mouth daily     ??? FLUoxetine (PROZAC) 20 MG capsule Take 40 mg by mouth daily     ??? folic acid (FOLVITE) 1 MG tablet Take 1 mg by mouth daily     ??? hydroxyurea (HYDREA) 500 MG chemo capsule Take 1,000 mg by mouth daily     ??? levETIRAcetam (KEPPRA) 750 MG tablet Take 1,000 mg by mouth 2 times daily     ??? lidocaine 4 % external patch Apply 1 patch to painful area, leave on for 12 hours, then remove for 12 hours.     ??? naproxen (EC NAPROSYN) 500 MG EC tablet Take 500 mg by mouth 2 times daily (with meals)     ??? pantoprazole (PROTONIX) 40 MG tablet Take 40 mg by mouth daily     ??? predniSONE 10 MG (21) TBPK Please take as directed on package. Please clarify any questions with the Pharmacist.     ??? rivaroxaban (XARELTO) 20 MG TABS tablet Take 20 mg by mouth daily (with breakfast)         No family history on  file.  Social History     Socioeconomic History   ??? Marital status: Single     Spouse name: Not on file   ??? Number of children: Not on file   ??? Years of  education: Not on file   ??? Highest education level: Not on file   Occupational History   ??? Not on file   Tobacco Use   ??? Smoking status: Never Smoker   ??? Smokeless tobacco: Never Used   Substance and Sexual Activity   ??? Alcohol use: Never   ??? Drug use: Never   ??? Sexual activity: Not on file   Other Topics Concern   ??? Not on file   Social History Narrative   ??? Not on file     Social Determinants of Health     Financial Resource Strain:    ??? Difficulty of Paying Living Expenses: Not on file   Food Insecurity:    ??? Worried About Running Out of Food in the Last Year: Not on file   ??? Ran Out of Food in the Last Year: Not on file   Transportation Needs:    ??? Lack of Transportation (Medical): Not on file   ??? Lack of Transportation (Non-Medical): Not on file   Physical Activity:    ??? Days of Exercise per Week: Not on file   ??? Minutes of Exercise per Session: Not on file   Stress:    ??? Feeling of Stress : Not on file   Social Connections:    ??? Frequency of Communication with Friends and Family: Not on file   ??? Frequency of Social Gatherings with Friends and Family: Not on file   ??? Attends Religious Services: Not on file   ??? Active Member of Clubs or Organizations: Not on file   ??? Attends Banker Meetings: Not on file   ??? Marital Status: Not on file   Intimate Partner Violence:    ??? Fear of Current or Ex-Partner: Not on file   ??? Emotionally Abused: Not on file   ??? Physically Abused: Not on file   ??? Sexually Abused: Not on file   Housing Stability:    ??? Unable to Pay for Housing in the Last Year: Not on file   ??? Number of Places Lived in the Last Year: Not on file   ??? Unstable Housing in the Last Year: Not on file       Review of Systems:  Complete ROS negative except as noted in the HPI.     Objective:     Vitals:    06/14/21 1230 06/14/21 1330 06/14/21 1430 06/14/21 1530   BP: 102/73 104/71 106/71 109/70   Pulse:       Resp:       Temp:       TempSrc:       SpO2: 95% 94% 96% 94%   Weight:       Height:            General:  alert, appears stated age and cooperative, appears uncomfortable (notes right shoulder/chest pain)   Skin:  normal   Eyes: conjunctivae/corneas clear. PERRL, EOM's intact. Fundi benign.   Mouth: MMM no lesions   Lymph Nodes:  Cervical, supraclavicular, and axillary nodes normal.   Lungs:  clear to auscultation bilaterally   Heart:  regular rate and rhythm, S1, S2 normal, no murmur, click, rub or gallop   Abdomen: soft, non-tender; bowel sounds normal; no masses,  no organomegaly   CVA:  absent   Genitourinary: Normal external genitalia, no lesions, no discharge, no CMT, no adnexal masses or tenderness, bleeding scant at time of exam   Extremities:  extremities normal, atraumatic, no cyanosis or edema   Neurologic:     Psychiatric:  Normal affect     Assessment:     Abnormal uterine bleeding     Currently admission due to sickle crisis pain and anemia     Plan:      Abnormal uterine bleeding, on anticoagulant     - Pelvic US noted normal uterus size, but slightly thickened endometrium that is heterogeneous.   - Hgb 8.4 on admission, transfusion PRN per primary team  - Current bleeding scant on exam  - We discussed possible causes of AUB based on findings to date including endometrial hyperplasia, malignancy of uterus or cervix, endometrial polyp, hormonal issues (thyroid abnormalities).   - I recommend patient have endometrial sampling and pap smear. Given scant bleeding on exam, will defer biopsy to the office, as a better exam and biopsy can be performed in this setting. IF bleeding increases again, and in not controlled by treatment plan below, would perform D&C during admission.  - Start Provera taper 10mg  TID x 3 days, then BID x 3 days, then continue daily until follow up in my office  - TSH ordered for AM labs, CBC already ordered by primary team  - Okay to continue anticoagulation   - Follow up at [facility] at discharge.     Sickle crisis, pain  - Per primary team    I will  continue to follow along, and happy to re-evaluate for any concern for worsening status of bleeding.    [facility], MD  [phone]

## 2021-06-14 NOTE — ED Provider Notes (Signed)
[facility] Emergency Department Provider Note                   PCP:                Lupita RaiderHarris Raza, DO               Age: 45 y.o.      Sex: female       ICD-10-CM    1. Sickle cell anemia with crisis (HCC)  D57.00    2. Vaginal bleeding  N93.9    3. Dehydration  E86.0        DISPOSITION Decision To Admit 06/14/2021 05:48:16 AM       New Prescriptions    No medications on file       Orders Placed This Encounter   Procedures   ??? XR CHEST (2 VW)   ??? Basic Metabolic Panel   ??? CBC   ??? D-Dimer, Quantitative   ??? Protime-INR   ??? Reticulocytes   ??? POCT Urine Dipstick   ??? POC Pregnancy Urine Qual   ??? EKG 12 Lead   ??? Saline lock IV         [name] Bright is a 45 y.o. female who presents to the Emergency Department with chief complaint of    Chief Complaint   Patient presents with   ??? 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  She reports some nausea.          Review of Systems   Constitutional: Positive for activity change and fatigue. Negative for appetite change, chills, diaphoresis and fever.   HENT: Negative for  congestion, facial swelling, nosebleeds and rhinorrhea.    Eyes: Negative for pain, discharge and redness.   Respiratory: Positive for chest tightness and shortness of breath. Negative for apnea, cough, choking, wheezing and stridor.    Cardiovascular: Positive for chest pain. Negative for palpitations and leg swelling.   Gastrointestinal: Negative for abdominal pain, constipation, diarrhea, nausea and vomiting.   Genitourinary: Positive for vaginal bleeding. Negative for decreased urine volume, dysuria, frequency, hematuria, pelvic pain, urgency, vaginal discharge and vaginal pain.   Musculoskeletal: Negative for back pain, gait problem, myalgias and neck pain.   Skin: Negative for pallor, rash and wound.   Neurological: Positive for dizziness, weakness (generalized) and light-headedness. Negative for tremors, numbness and headaches.   Psychiatric/Behavioral: Negative for agitation, behavioral problems, confusion and hallucinations. The patient is not nervous/anxious and is not hyperactive.       All other systems reviewed and are negative.      Past Medical History:   Diagnosis Date   ???  Seizures (HCC)    ??? Sickle cell disease (HCC)    ??? Stroke [facility])         History reviewed. No pertinent surgical history.     No family history on file.        Social Connections:    ??? Frequency of Communication with Friends and Family: Not on file   ??? Frequency of Social Gatherings with Friends and Family: Not on file   ??? Attends Religious Services: Not on file   ??? Active Member of Clubs or Organizations: Not on file   ??? Attends Banker Meetings: Not on file   ??? Marital Status: Not on file        Allergies   Allergen Reactions   ??? Ceftriaxone Other (See Comments)   ??? Fentanyl Other (See Comments)     "my doctor said I had a stroke from it"   ??? Meperidine Other (See Comments)   ??? Morphine Itching   ??? Oxycodone Other (See Comments)        Vitals signs and nursing note reviewed.   Patient Vitals for the past 4 hrs:    Pulse Resp BP SpO2   06/14/21 0447 -- -- 105/83 98 %   06/14/21 0437 -- -- -- 98 %   06/14/21 0407 91 12 -- 97 %   06/14/21 0357 91 15 -- 98 %   06/14/21 0347 95 18 -- 98 %   06/14/21 0337 90 15 -- 98 %   06/14/21 0327 92 17 -- 97 %   06/14/21 0317 89 14 -- 98 %   06/14/21 0207 98 22 -- 98 %   06/14/21 0152 96 15 -- 97 %          Physical Exam  Vitals and nursing note reviewed.   Constitutional:       General: She is not in acute distress.     Appearance: Normal appearance. She is not toxic-appearing or diaphoretic.      Comments: Lying on stretcher in semireclined position in no acute distress.  Appears mildly uncomfortable.  Nontoxic-appearing.  Not acutely ill-appearing.  Vital signs are within normal limits.   HENT:      Head: Normocephalic and atraumatic.      Right Ear: External ear normal.      Left Ear: External ear normal.      Nose: Nose normal. No congestion or rhinorrhea.      Mouth/Throat:      Mouth: Mucous membranes are moist.      Pharynx: No oropharyngeal exudate or posterior oropharyngeal erythema.   Eyes:      General: No scleral icterus.        Right eye: No discharge.         Left eye: No discharge.      Extraocular Movements: Extraocular movements intact.      Pupils: Pupils are equal, round, and reactive to light.   Cardiovascular:      Rate and Rhythm: Normal rate.      Pulses: Normal pulses.      Heart sounds: Normal heart sounds. No murmur heard.  No friction rub. No gallop.    Pulmonary:      Effort: Pulmonary effort is normal. No respiratory distress.      Breath sounds: Normal breath sounds. No stridor. No wheezing, rhonchi or rales.   Chest:      Chest wall: Tenderness (associated right shoulder tenderness to palpation that fully reproduces patient's experience  of pain in that area.) present.   Abdominal:      General: Abdomen is flat. Bowel sounds are normal. There is no distension.      Palpations: Abdomen is soft. There is no mass.      Tenderness: There is no abdominal tenderness.  There is no right CVA tenderness, left CVA tenderness or guarding.   Musculoskeletal:         General: No swelling, tenderness, deformity or signs of injury. Normal range of motion.      Cervical back: Normal range of motion and neck supple. No rigidity or tenderness.      Right lower leg: No edema.      Left lower leg: No edema.   Lymphadenopathy:      Cervical: No cervical adenopathy.   Skin:     General: Skin is warm.      Capillary Refill: Capillary refill takes less than 2 seconds.      Coloration: Skin is not jaundiced or pale.      Findings: No bruising or erythema.   Neurological:      General: No focal deficit present.      Mental Status: She is alert and oriented to person, place, and time.      Motor: No weakness.      Coordination: Coordination normal.      Gait: Gait normal.   Psychiatric:         Mood and Affect: Mood normal.         Behavior: Behavior normal.         Thought Content: Thought content normal.         Judgment: Judgment normal.          MDM  Number of Diagnoses or Management Options  Dehydration  Sickle cell anemia with crisis (HCC)  Vaginal bleeding  Diagnosis management comments: Appears to be in acute sickle crisis with associated pain and significant vaginal bleeding.  Hemoglobin is actually in the eights where it often is in the sevens.  Pain improved with treatment.  Requires admission for ongoing hydration, monitoring, pain control.  Of note, bicarb is 16 where it is normally in the mid 20s.  Discussed this patient with Dr. Dairl Ponder, attending hospitalist on-call, to request evaluation for admission.  I sincerely appreciate her involvement in the ongoing care of this patient.       Amount and/or Complexity of Data Reviewed  Clinical lab tests: ordered and reviewed  Tests in the medicine section of CPT??: ordered and reviewed  Decide to obtain previous medical records or to obtain history from someone other than the patient: yes  Review and summarize past medical records: yes  Discuss  the patient with other providers: yes  Independent visualization of images, tracings, or specimens: yes        EKG 12 Lead    Date/Time: 06/14/2021 2:29 AM  Performed by: Lily Lovings, MD  Authorized by: Lily Lovings, MD     ECG reviewed by ED Physician in the absence of a cardiologist: yes    Previous ECG:     Previous ECG:  Unavailable  Interpretation:     Interpretation: normal    Rate:     ECG rate:  96    ECG rate assessment: normal    Rhythm:     Rhythm: sinus rhythm    Ectopy:     Ectopy: none    QRS:     QRS axis:  Normal  Conduction:     Conduction: normal    ST segments:     ST segments:  Normal  T waves:     T waves: normal    Comments:      No pathologic Q waves noted.  Normal QT interval.        Labs Reviewed   BASIC METABOLIC PANEL - Abnormal; Notable for the following components:       Result Value    Chloride 110 (*)     CO2 16 (*)     Glucose 117 (*)     CREATININE 1.10 (*)     GFR Non-African American 57 (*)     All other components within normal limits   CBC - Abnormal; Notable for the following components:    RBC 1.94 (*)     Hemoglobin 8.4 (*)     Hematocrit 23.9 (*)     MCV 123.2 (*)     MCH 43.3 (*)     MCHC 35.1 (*)     RDW 17.7 (*)     nRBC 0.92 (*)     All other components within normal limits   PROTIME-INR - Abnormal; Notable for the following components:    Protime 18.4 (*)     All other components within normal limits   RETICULOCYTES - Abnormal; Notable for the following components:    Reticulocyte Count,Automated 5.0 (*)     Immature Retic Fraction 28.0 (*)     Retic Hemoglobin conc. 41 (*)     All other components within normal limits   D-DIMER, QUANTITATIVE   POC PREGNANCY UR-QUAL        XR CHEST (2 VW)   Final Result   No evidence of an acute intrathoracic process.                  Glasgow Coma Scale  Eye Opening: Spontaneous  Best Verbal Response: Oriented  Best Motor Response: Obeys commands  Glasgow Coma Scale Score: 15                     Voice dictation software was used  during the making of this note.  This software is not perfect and grammatical and other typographical errors may be present.  This note has not been completely proofread for errors.       Lily Lovings, MD  06/14/21 [phone]

## 2021-06-14 NOTE — Progress Notes (Signed)
CM met with patient at bedside.  Patient alert/orient x4.  Patient verified demographic no changes.  Verified her PCP and insurance.  Patient states she lives with her mother in a one level apartment with elevator entrance to the apartment.  Patient state she's independent with her ADL's and has no DME's in the home.  Patient has no hx with HH / rehab.  Patient has no needs identified at this time.  CM will continue to follow for any needs or concern that may occur.               06/14/21 1041   Service Assessment   Patient Orientation Alert and Oriented;Person;Place;Situation;Self   Cognition Alert   History Provided By Patient   Primary [place] Parent   Patient's Healthcare Decision Maker is: Legal Next of Kin   PCP Verified by CM Yes   Last Visit to PCP Within last 3 months   Prior Functional Level Independent in ADLs/IADLs   Current Functional Level Independent in ADLs/IADLs   Can patient return to prior living arrangement Yes   Ability to make needs known: Good   Family able to assist with home care needs: Yes   Would you like for me to discuss the discharge plan with any other family members/significant others, and if so, who? Yes   Social/Functional History   Type of Home Apartment   Home Layout One level   Home Access Elevator   Bathroom Shower/Tub Tub/Shower unit;Walk-in Chemical engineer Help From Family   ADL Assistance Independent   Homemaking Assistance Independent   Homemaking Responsibilities Yes   Ambulation Assistance Independent   Transfer Assistance Independent   Active Driver Yes   Mode of Transportation Car   Occupation On disability   Discharge Planning   Potential Assistance Purchasing Medications No   Patient expects to be discharged to: Apartment   One/Two Story Residence One story   History of falls? 0   Services At/After Discharge   Services At/After Discharge None   [employer] Information  Provided? No   Mode of Transport at Discharge Other (see comment)   Confirm Follow Up Transport Family

## 2021-06-14 NOTE — ED Notes (Signed)
Report given to [place], RN     Roberts Gaudy, RN  06/14/21 [phone]

## 2021-06-14 NOTE — Procedures (Signed)
Name: [name] Bright MRN: [id number]   DOB: 08-Oct-1976 [facility]: [facility]   Date: 06/14/2021        US Guided Intravenous Access Note        I was called to patient's bedside to evaluate for IV access due to difficulty obtaining IV access.  Multiple nursing staff had attempted without success.     20-gauge peripheral IV was placed in the left basilic vein under ultrasound guidance on first attempt with no complications.  Placement was confirmed with good drawback and flushing without resistance. Patient tolerated well.  Loss of blood less than 2 mL.      Lily Lovings, MD    5:51 AM

## 2021-06-14 NOTE — ED Notes (Signed)
Spoke w/ [facility], Dr. Fenton Malling paged for consult.     Clayborn Heron, RN  06/14/21 1520

## 2021-06-14 NOTE — Progress Notes (Signed)
Patient arrived to [place] department and stated she was not given water to fill her bladder. Checked patient at 9:20am, bladder not full; sent patient back to ED and notified RN that patient needs to drink water to fill bladder for exam. SN

## 2021-06-15 ENCOUNTER — Inpatient Hospital Stay: Admit: 2021-06-15 | Payer: MEDICARE | Primary: Student in an Organized Health Care Education/Training Program

## 2021-06-15 LAB — CBC WITH AUTO DIFFERENTIAL
Absolute Eos #: 0.1 10*3/uL (ref 0.0–0.8)
Absolute Immature Granulocyte: 0 10*3/uL (ref 0.0–0.5)
Absolute Lymph #: 2.4 10*3/uL (ref 0.5–4.6)
Absolute Mono #: 0.7 10*3/uL (ref 0.1–1.3)
Basophils Absolute: 0 10*3/uL (ref 0.0–0.2)
Basophils: 0 % (ref 0.0–2.0)
Eosinophils %: 1 % (ref 0.5–7.8)
Hematocrit: 19.5 % — ABNORMAL LOW (ref 35.8–46.3)
Hemoglobin: 7 g/dL — ABNORMAL LOW (ref 11.7–15.4)
Immature Granulocytes: 1 % (ref 0.0–5.0)
Lymphocytes: 30 % (ref 13–44)
MCH: 43.8 PG — ABNORMAL HIGH (ref 26.1–32.9)
MCHC: 35.9 g/dL — ABNORMAL HIGH (ref 31.4–35.0)
MCV: 121.9 FL — ABNORMAL HIGH (ref 79.6–97.8)
MPV: 10.3 FL (ref 9.4–12.3)
Monocytes: 9 % (ref 4.0–12.0)
Platelets: 259 10*3/uL (ref 150–450)
RBC: 1.6 M/uL — ABNORMAL LOW (ref 4.05–5.2)
RDW: 17.8 % — ABNORMAL HIGH (ref 11.9–14.6)
Seg Neutrophils: 59 % (ref 43–78)
Segs Absolute: 4.6 10*3/uL (ref 1.7–8.2)
WBC: 7.8 10*3/uL (ref 4.3–11.1)
nRBC: 0.66 10*3/uL — ABNORMAL HIGH (ref 0.0–0.2)

## 2021-06-15 LAB — BASIC METABOLIC PANEL W/ REFLEX TO MG FOR LOW K
Anion Gap: 5 mmol/L — ABNORMAL LOW (ref 7–16)
BUN: 8 MG/DL (ref 6–23)
CO2: 23 mmol/L (ref 21–32)
Calcium: 8.7 MG/DL (ref 8.3–10.4)
Chloride: 107 mmol/L (ref 98–107)
Creatinine: 0.8 MG/DL (ref 0.6–1.0)
GFR African American: >60 mL/min/{1.73_m2} (ref >60)
GFR Non-African American: >60 mL/min/{1.73_m2} (ref >60)
Glucose: 110 mg/dL — ABNORMAL HIGH (ref 65–100)
Potassium: 4.6 mmol/L (ref 3.5–5.1)
Sodium: 135 mmol/L — ABNORMAL LOW (ref 136–145)

## 2021-06-15 LAB — APTT
PTT: 157.3 s — ABNORMAL HIGH (ref 24.1–35.1)
PTT: 193.7 s (ref 24.1–35.1)
PTT: 92.1 s — ABNORMAL HIGH (ref 24.1–35.1)

## 2021-06-15 LAB — ANTI-XA, UNFRACTIONATED HEPARIN: Anti-XA Unfrac Heparin: >1.1 IU/mL — ABNORMAL HIGH (ref 0.3–0.7)

## 2021-06-15 LAB — TSH WITH REFLEX: TSH w Free Thyroid if Abnormal: 2 u[IU]/mL (ref 0.358–3.740)

## 2021-06-15 MED ORDER — NORMAL SALINE FLUSH 0.9 % IV SOLN
0.9 % | Freq: Once | INTRAVENOUS | Status: AC | PRN
Start: 2021-06-15 — End: 2021-06-15
  Administered 2021-06-15: 16:00:00 10 mL via INTRAVENOUS

## 2021-06-15 MED ORDER — DEXTROSE-NACL 5-0.45 % IV SOLN
5-0.45 % | INTRAVENOUS | Status: DC
Start: 2021-06-15 — End: 2021-06-18
  Administered 2021-06-15 – 2021-06-18 (×7): via INTRAVENOUS

## 2021-06-15 MED ORDER — DIPHENHYDRAMINE HCL 25 MG PO CAPS
25 MG | Freq: Four times a day (QID) | ORAL | Status: DC | PRN
Start: 2021-06-15 — End: 2021-06-19
  Administered 2021-06-15 – 2021-06-16 (×2): 25 mg via ORAL

## 2021-06-15 MED ORDER — FOLIC ACID 1 MG PO TABS
1 MG | Freq: Every day | ORAL | Status: DC
Start: 2021-06-15 — End: 2021-06-19
  Administered 2021-06-15 – 2021-06-19 (×5): 1 mg via ORAL

## 2021-06-15 MED ORDER — IOPAMIDOL 76 % IV SOLN
76 % | Freq: Once | INTRAVENOUS | Status: AC | PRN
Start: 2021-06-15 — End: 2021-06-15
  Administered 2021-06-15: 16:00:00 80 mL via INTRAVENOUS

## 2021-06-15 MED ORDER — RIVAROXABAN 20 MG PO TABS
20 MG | Freq: Every day | ORAL | Status: DC
Start: 2021-06-15 — End: 2021-06-19
  Administered 2021-06-15 – 2021-06-19 (×3): 20 mg via ORAL

## 2021-06-15 MED ORDER — SODIUM CHLORIDE 0.9 % IV BOLUS
0.9 % | Freq: Once | INTRAVENOUS | Status: AC | PRN
Start: 2021-06-15 — End: 2021-06-15
  Administered 2021-06-15: 16:00:00 100 mL via INTRAVENOUS

## 2021-06-15 MED FILL — MEDROXYPROGESTERONE ACETATE 10 MG PO TABS: 10 mg | ORAL | Qty: 1

## 2021-06-15 MED FILL — LEVETIRACETAM 500 MG PO TABS: 500 mg | ORAL | Qty: 3

## 2021-06-15 MED FILL — FOLIC ACID 1 MG PO TABS: 1 mg | ORAL | Qty: 1

## 2021-06-15 MED FILL — HYDROXYUREA 500 MG PO CAPS: 500 mg | ORAL | Qty: 2

## 2021-06-15 MED FILL — XARELTO 20 MG PO TABS: 20 mg | ORAL | Qty: 1

## 2021-06-15 MED FILL — OXYCODONE-ACETAMINOPHEN 5-325 MG PO TABS: 5-325 mg | ORAL | Qty: 1

## 2021-06-15 MED FILL — ACETAMINOPHEN 325 MG PO TABS: 325 mg | ORAL | Qty: 2

## 2021-06-15 MED FILL — DIPHENHYDRAMINE HCL 25 MG PO CAPS: 25 mg | ORAL | Qty: 1

## 2021-06-15 MED FILL — HYDROMORPHONE HCL 1 MG/ML IJ SOLN: 1 mg/mL | INTRAMUSCULAR | Qty: 1

## 2021-06-15 MED FILL — LIDOCAINE PAIN RELIEF 4 % EX PTCH: 4 % | CUTANEOUS | Qty: 1

## 2021-06-15 NOTE — Plan of Care (Signed)
Problem: Pain  Goal: Verbalizes/displays adequate comfort level or baseline comfort level  Outcome: Progressing     Problem: Safety - Adult  Goal: Free from fall injury  Outcome: Progressing  Flowsheets (Taken 06/15/2021 0811)  Free From Fall Injury: Instruct family/caregiver on patient safety     Problem: ABCDS Injury Assessment  Goal: Absence of physical injury  Outcome: Progressing  Flowsheets (Taken 06/15/2021 0811)  Absence of Physical Injury: Implement safety measures based on patient assessment

## 2021-06-15 NOTE — Progress Notes (Signed)
Hospitalist Progress Note   Admit Date:  06/14/2021  1:21 AM   Name:  [name] Bright   Age:  45 y.o.  Sex:  female  DOB:  1976-09-30   MRN:  [id number]   Room:  537/01      Reason(s) for Admission: Dehydration [E86.0]  Vaginal bleeding [N93.9]  Sickle cell anemia with crisis (HCC) [D57.00]  Acute chest wall pain [R07.89]     Hospital Course & Interval History:   [name] Bright is a 45 y.o. 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 She C/o  Increase menstrual flow with clots. It is associated with Mild dizziness/lightheadedness, Mild lower abdominal discomfort.  New pleuritic chest pain, increased on movement and better at rest. Hb 8.4,D-dimer 0.51,Pregnancy test negative. Initial ekg sinus with QTC prolongation, repeat EKG sinus , mild t wave inversion lead lll,normal qtc.    Subjective/24hr Events (06/15/21):  Patient is complaining of severe pleuritic chest pain.  It is associated with nausea.  Patient denies vomiting, diarrhea, dizziness, palpitations.      Assessment & Plan:   Menorrhagia  Ultrasound showed slight heterogeneity of endometrium and borderline thickening.   Follow-up with OB/GYN as outpatient for endometrial sampling and pap smear  - Started Provera taper  TID x 3 days, then BID x 3 days, then continue daily until follow up with OB/[facility]N at [facility].   TSH normal  Gynecology following    Sickle cell crisis  - EKG with SR  - CXR neg  Continue IV fluids  Pain management with the Dilaudid  Follow-up with hemoglobin fractionation.  Follow-up with CT of chest  Continue home medication Hydrea and resume Folic acid    Anemia  Hemoglobin 8.4 on admission  Currently with minimal bleeding    ??Rt chest wall pain- reproducible via palpation vs sickle cell crisis  D-dimer normal  ??  H/O  DVT/PE  On xarelto at home , says has been for 10 odd years  Heparin drip is discontinued   Resumed Xarelto  on 7/13  ??  Seizure  Keppra 1500 mg bid  ??  Anxiety /depression  prozac 40 mg q daily    Diet:  ADULT DIET; Regular  DVT PPx: Xarelto  Code status: Full Code    Hospital Problems           Last Modified POA    * (Principal) Acute chest wall pain 06/14/2021 Yes    Sickle cell anemia with crisis (HCC) 06/15/2021 Yes          Objective:     Patient Vitals for the past 24 hrs:   Temp Pulse Resp BP SpO2   06/15/21 1228 -- -- 18 -- --   06/15/21 1217 97.9 ??F (36.6 ??C) (!) 102 -- (!) 134/97 97 %   06/15/21 0901 98 ??F (36.7 ??C) 100 18 110/77 94 %   06/15/21 0227 97.9 ??F (36.6 ??C) 84 20 112/78 97 %   06/14/21 2300 97.8 ??F (36.6 ??C) (!) 109 20 112/63 97 %   06/14/21 2144 98.4 ??F (36.9 ??C) 93 18 119/81 100 %   06/14/21 1840 -- -- -- 113/78 98 %   06/14/21 1730 -- -- -- 118/81 96 %   06/14/21 1630 -- -- -- 104/78 95 %   06/14/21 1530 -- -- -- 109/70  94 %   06/14/21 1430 -- -- -- 106/71 96 %   06/14/21 1330 -- -- -- 104/71 94 %       Estimated body mass index is 25.76 kg/m?? as calculated from the following:    Height as of this encounter: 5\' 8"  (1.727 m).    Weight as of this encounter: 169 lb 6.4 oz (76.8 kg).    Intake/Output Summary (Last 24 hours) at 06/15/2021 1321  Last data filed at 06/15/2021 0901  Gross per 24 hour   Intake 120 ml   Output --   Net 120 ml         Physical Exam:   Blood pressure (!) 134/97, pulse (!) 102, temperature 97.9 ??F (36.6 ??C), resp. rate 18, height 5\' 8"  (1.727 m), weight 169 lb 6.4 oz (76.8 kg), SpO2 97 %.  General:    Well nourished. No overt distress.  Head:  Normocephalic, atraumatic  Eyes:  Sclerae appear normal. Pupils equally round.  ENT:  Nares appear normal, no drainage. Moist oral mucosa  Neck:  No restricted ROM. Trachea midline.   CV:   RRR.  No m/r/g. No jugular venous distension.  Lungs:   CTAB.  No wheezing, rhonchi, or rales. Respirations even, unlabored.  Abdomen:   Bowel  sounds present. Soft, nontender, nondistended.  Extremities: No cyanosis or clubbing. No edema.  Skin:     No rashes and normal coloration. Warm and dry.    Neuro:  CN II-XII grossly intact. Sensation intact.  A&Ox3  Psych:  Normal mood and affect.      I have reviewed ordered lab tests and independently visualized imaging below:    Recent Labs:  Recent Results (from the past 48 hour(s))   EKG 12 Lead    Collection Time: 06/14/21  1:13 AM   Result Value Ref Range    Ventricular Rate 109 BPM    Atrial Rate 109 BPM    P-R Interval 128 ms    QRS Duration 72 ms    Q-T Interval 464 ms    QTc Calculation (Bazett) 624 ms    P Axis 50 degrees    R Axis 21 degrees    T Axis 47 degrees    Diagnosis Sinus tachycardia    Basic Metabolic Panel    Collection Time: 06/14/21  1:29 AM   Result Value Ref Range    Sodium 141 136 - 145 mmol/L    Potassium 4.2 3.5 - 5.1 mmol/L    Chloride 110 (H) 98 - 107 mmol/L    CO2 16 (L) 21 - 32 mmol/L    Anion Gap 15 7 - 16 mmol/L    Glucose 117 (H) 65 - 100 mg/dL    BUN 10 6 - 23 MG/DL    CREATININE 08/15/21 (H) 0.6 - 1.0 MG/DL    GFR African American >60 >60 ml/min/1.85m2    GFR Non-African American 57 (L) >60 ml/min/1.48m2    Calcium 9.0 8.3 - 10.4 MG/DL   CBC    Collection Time: 06/14/21  1:29 AM   Result Value Ref Range    WBC 6.9 4.3 - 11.1 K/uL    RBC 1.94 (L) 4.05 - 5.2 M/uL    Hemoglobin 8.4 (L) 11.7 - 15.4 g/dL    Hematocrit 75m (L) 35.8 - 46.3 %    MCV 123.2 (H) 79.6 - 97.8 FL    MCH 43.3 (H) 26.1 - 32.9 PG    MCHC 35.1 (H) 31.4 - 35.0 g/dL  RDW 17.7 (H) 11.9 - 14.6 %    Platelets 385 150 - 450 K/uL    MPV 9.4 9.4 - 12.3 FL    nRBC 0.92 (H) 0.0 - 0.2 K/uL   D-Dimer, Quantitative    Collection Time: 06/14/21  1:29 AM   Result Value Ref Range    D-Dimer, Quant 0.51 <0.56 ug/ml(FEU)   Protime-INR    Collection Time: 06/14/21  2:45 AM   Result Value Ref Range    Protime 18.4 (H) 12.6 - 14.5 sec    INR 1.5     Reticulocytes    Collection Time: 06/14/21  2:45 AM   Result Value Ref Range     Reticulocyte Count,Automated 5.0 (H) 0.3 - 2.0 %    Absolute Retic # 0.0927 0.026 - 0.095 M/ul    Immature Retic Fraction 28.0 (H) 3.0 - 15.9 %    Retic Hemoglobin conc. 41 (H) 29 - 35 pg   EKG 12 Lead    Collection Time: 06/14/21  7:47 AM   Result Value Ref Range    Ventricular Rate 85 BPM    Atrial Rate 85 BPM    P-R Interval 175 ms    QRS Duration 90 ms    Q-T Interval 381 ms    QTc Calculation (Bazett) 451 ms    P Axis 53 degrees    R Axis 16 degrees    T Axis -8 degrees    Diagnosis Sinus rhythm    POC Pregnancy Urine Qual    Collection Time: 06/14/21  8:47 AM   Result Value Ref Range    Preg Test, Ur Negative NEG     Anti-Xa, Unfractionated Heparin    Collection Time: 06/14/21  4:15 PM   Result Value Ref Range    Anti-XA Unfrac Heparin >1.1 (H) 0.3 - 0.7 IU/mL   APTT    Collection Time: 06/14/21  4:15 PM   Result Value Ref Range    PTT 157.3 (H) 24.1 - 35.1 SEC   APTT    Collection Time: 06/14/21 11:59 PM   Result Value Ref Range    PTT 193.7 (HH) 24.1 - 35.1 SEC   Basic Metabolic Panel w/ Reflex to MG    Collection Time: 06/15/21  6:31 AM   Result Value Ref Range    Sodium 135 (L) 136 - 145 mmol/L    Potassium 4.6 3.5 - 5.1 mmol/L    Chloride 107 98 - 107 mmol/L    CO2 23 21 - 32 mmol/L    Anion Gap 5 (L) 7 - 16 mmol/L    Glucose 110 (H) 65 - 100 mg/dL    BUN 8 6 - 23 MG/DL    CREATININE 8.41 0.6 - 1.0 MG/DL    GFR African American >60 >60 ml/min/1.16m2    GFR Non-African American >60 >60 ml/min/1.53m2    Calcium 8.7 8.3 - 10.4 MG/DL   CBC with Auto Differential    Collection Time: 06/15/21  6:31 AM   Result Value Ref Range    WBC 7.8 4.3 - 11.1 K/uL    RBC 1.60 (L) 4.05 - 5.2 M/uL    Hemoglobin 7.0 (L) 11.7 - 15.4 g/dL    Hematocrit 32.4 (L) 35.8 - 46.3 %    MCV 121.9 (H) 79.6 - 97.8 FL    MCH 43.8 (H) 26.1 - 32.9 PG    MCHC 35.9 (H) 31.4 - 35.0 g/dL    RDW 40.1 (H) 02.7 - 14.6 %  Platelets 259 150 - 450 K/uL    MPV 10.3 9.4 - 12.3 FL    nRBC 0.66 (H) 0.0 - 0.2 K/uL    Differential Type AUTOMATED      Seg  Neutrophils 59 43 - 78 %    Lymphocytes 30 13 - 44 %    Monocytes 9 4.0 - 12.0 %    Eosinophils % 1 0.5 - 7.8 %    Basophils 0 0.0 - 2.0 %    Immature Granulocytes 1 0.0 - 5.0 %    Segs Absolute 4.6 1.7 - 8.2 K/UL    Absolute Lymph # 2.4 0.5 - 4.6 K/UL    Absolute Mono # 0.7 0.1 - 1.3 K/UL    Absolute Eos # 0.1 0.0 - 0.8 K/UL    Basophils Absolute 0.0 0.0 - 0.2 K/UL    Absolute Immature Granulocyte 0.0 0.0 - 0.5 K/UL   APTT    Collection Time: 06/15/21  6:31 AM   Result Value Ref Range    PTT 92.1 (H) 24.1 - 35.1 SEC   TSH with Reflex    Collection Time: 06/15/21 10:16 AM   Result Value Ref Range    TSH w Free Thyroid if Abnormal 2.00 0.358 - 3.740 UIU/ML         Other Studies:  XR CHEST (2 VW)    Result Date: 06/14/2021  EXAM: XR CHEST (2 VW) HISTORY: sickle cell.  TECHNIQUE: Frontal and lateral chest. COMPARISON: 11/21/2020 FINDINGS: The cardiac silhouette, mediastinum, and pulmonary vasculature are within normal limits. There is no consolidation, pleural effusion, or pneumothorax. No significant osseous abnormalities are observed.     No evidence of an acute intrathoracic process.     US PELVIS COMPLETE    Result Date: 06/14/2021  PELVIC ULTRASOUND. HISTORY:  Pelvic pain TECHNIQUE:  Transabdominal and endovaginal  images were obtained of the pelvis. FINDINGS:  - UTERUS:  7.5 cm in length.  Endometrial stripe measures 13 mm.  Endometrium is slightly heterogeneous.  No discrete myometrial or endometrial mass is seen. -  RIGHT OVARY: Never identified.  No significant right pelvic mass.  -  LEFT OVARY:  3.1 x 1.3 x 2.3 cm.  No significant adnexal mass.  Normal blood flow. No significant free fluid.     Slight heterogeneity of endometrium and borderline thickening. Consider follow-up to exclude residual tumor/hyperplasia.       Current Meds:  Current Facility-Administered Medications   Medication Dose Route Frequency   ??? diphenhydrAMINE (BENADRYL) capsule 25 mg  25 mg Oral Q6H PRN   ??? rivaroxaban (XARELTO) tablet 20 mg   20 mg Oral Daily with breakfast   ??? dextrose 5 % and 0.45 % sodium chloride infusion   IntraVENous Continuous   ??? folic acid (FOLVITE) tablet 1 mg  1 mg Oral Daily   ??? 0.9 % sodium chloride bolus  100 mL IntraVENous ONCE PRN   ??? hydroxyurea (HYDREA) chemo capsule 1,000 mg  1,000 mg Oral Daily   ??? levETIRAcetam (KEPPRA) tablet 1,500 mg  1,500 mg Oral BID   ??? lidocaine 4 % external patch 1 patch  1 patch TransDERmal Daily   ??? sodium chloride flush 0.9 % injection 5-40 mL  5-40 mL IntraVENous 2 times per day   ??? sodium chloride flush 0.9 % injection 5-40 mL  5-40 mL IntraVENous PRN   ??? 0.9 % sodium chloride infusion   IntraVENous PRN   ??? ondansetron (ZOFRAN-ODT) disintegrating tablet 4 mg  4 mg Oral  Q8H PRN    Or   ??? ondansetron (ZOFRAN) injection 4 mg  4 mg IntraVENous Q6H PRN   ??? polyethylene glycol (GLYCOLAX) packet 17 g  17 g Oral Daily PRN   ??? acetaminophen (TYLENOL) tablet 650 mg  650 mg Oral Q6H PRN    Or   ??? acetaminophen (TYLENOL) suppository 650 mg  650 mg Rectal Q6H PRN   ??? HYDROmorphone HCl PF (DILAUDID) injection 1 mg  1 mg IntraVENous Q4H PRN   ??? oxyCODONE-acetaminophen (PERCOCET) 5-325 MG per tablet 1 tablet  1 tablet Oral Q4H PRN   ??? medroxyPROGESTERone (PROVERA) tablet 10 mg  10 mg Oral q8h       Signed:  Verdis Prime, MD    Part of this note may have been written by using a voice dictation software.  The note has been proof read but may still contain some grammatical/other typographical errors.

## 2021-06-15 NOTE — Progress Notes (Signed)
END OF SHIFT SUMMARY:    Significant vitals this shift:  0  Significant labs this shift:  Hgb 7.0  Tests performed this shift:  CT soft tissue neck/chest.  Orders to be followed up on:  0  Blood products given this shift:  0  Additional events this shift:   Dilaudid X2, Oxy X1,     I/Os:  +/- this shift:   07/13 0701 - 07/13 1900  In: 3115 [P.O.:715; I.V.:2400]  Out: -   Occurrences this Shift:  Urine 3-4, BM 0, Emesis 0    Ashley Murrain, RN

## 2021-06-15 NOTE — Consults (Signed)
HEMATOLOGY/ MEDICAL ONCOLOGY CONSULTATION      Patient Name: [name] Bright    Date of Consult: 06/15/2021  DOB: [birth year]  Age:45 y.o.   ZOX:[id number]          Reason for Consultation:  Ms. [name] Bright is a 45 y.o. female admitted on 06/14/2021 with a primary diagnosis of The primary encounter diagnosis was Sickle cell anemia with crisis (HCC). Diagnoses of Vaginal bleeding and Dehydration were also pertinent to this visit.Marland Kitchen.     History of Present Illness:  MS. [name] Bright is seen for a possible sickle crisis. She has had limited contact with our office and [facility]. She does not seem like someone who has had frequent hospitalizations. She has had a history of DVT, seizures, SVC occlusion, thrombocytosis and iron overload. There is also a history of CVA. She is chronically anticoagulated. She is maintained on Hydrea and folic acid. She had been receiving monthly infusions of [place]ve[place] in the past. Her history is a bit scattered in that she has had care both in the [place] and a variety of other areas.     She has admitted on this occasion with chest discomfort and symptoms which she believes are reminiscent of her sickle crises. The pain she describes is in her right chest, occasionally worse with breathing. She has chronic epistaxis as well and states she is fully compliant with her Xarelto. She has chronic pain in her right neck. She has had no fever.          Medications:   Current Facility-Administered Medications   Medication Dose Route Frequency Provider Last Rate Last Admin   ??? diphenhydrAMINE (BENADRYL) capsule 25 mg  25 mg Oral Q6H PRN Ramana Guduru, MD   25 mg at 06/15/21 0138   ??? rivaroxaban (XARELTO) tablet 20 mg  20 mg Oral Daily with breakfast Verdis Primeejaswi Jada, MD   20 mg at 06/15/21 0815   ??? dextrose 5 % and 0.45 % sodium chloride infusion   IntraVENous Continuous Trisha Mangleana Laird, APRN - CNP 150 mL/hr at 06/15/21 0813 New Bag at 06/15/21 0813   ??? folic acid (FOLVITE) tablet 1 mg  1 mg Oral  Daily Trisha Mangleana Laird, APRN - CNP   1 mg at 06/15/21 0815   ??? 0.9 % sodium chloride bolus  100 mL IntraVENous ONCE PRN Trisha Mangleana Laird, APRN - CNP       ??? sodium chloride flush 0.9 % injection 10 mL  10 mL IntraVENous ONCE PRN Trisha Mangleana Laird, APRN - CNP       ??? iopamidol (ISOVUE-370) 76 % injection 80 mL  80 mL IntraVENous ONCE PRN Trisha Mangleana Laird, APRN - CNP       ??? hydroxyurea (HYDREA) chemo capsule 1,000 mg  1,000 mg Oral Daily Britta MccreedySubhose Bathina, MD       ??? levETIRAcetam (KEPPRA) tablet 1,500 mg  1,500 mg Oral BID Britta MccreedySubhose Bathina, MD   1,500 mg at 06/15/21 0815   ??? lidocaine 4 % external patch 1 patch  1 patch TransDERmal Daily Britta MccreedySubhose Bathina, MD   1 patch at 06/15/21 0815   ??? sodium chloride flush 0.9 % injection 5-40 mL  5-40 mL IntraVENous 2 times per day Britta MccreedySubhose Bathina, MD   10 mL at 06/15/21 0815   ??? sodium chloride flush 0.9 % injection 5-40 mL  5-40 mL IntraVENous PRN Britta MccreedySubhose Bathina, MD       ??? 0.9 % sodium chloride infusion   IntraVENous PRN Britta MccreedySubhose Bathina, MD       ???  ondansetron (ZOFRAN-ODT) disintegrating tablet 4 mg  4 mg Oral Q8H PRN Britta Mccreedy, MD        Or   ??? ondansetron (ZOFRAN) injection 4 mg  4 mg IntraVENous Q6H PRN Britta Mccreedy, MD   4 mg at 06/14/21 1909   ??? polyethylene glycol (GLYCOLAX) packet 17 g  17 g Oral Daily PRN Britta Mccreedy, MD       ??? acetaminophen (TYLENOL) tablet 650 mg  650 mg Oral Q6H PRN Britta Mccreedy, MD        Or   ??? acetaminophen (TYLENOL) suppository 650 mg  650 mg Rectal Q6H PRN Britta Mccreedy, MD       ??? HYDROmorphone HCl PF (DILAUDID) injection 1 mg  1 mg IntraVENous Q4H PRN Britta Mccreedy, MD   1 mg at 06/15/21 0614   ??? oxyCODONE-acetaminophen (PERCOCET) 5-325 MG per tablet 1 tablet  1 tablet Oral Q4H PRN Britta Mccreedy, MD       ??? heparin (porcine) injection 6,170 Units  80 Units/kg IntraVENous PRN Britta Mccreedy, MD   6,170 Units at 06/14/21 1049   ??? heparin (porcine) injection 3,080 Units  40 Units/kg IntraVENous PRN Britta Mccreedy, MD       ??? medroxyPROGESTERone  (PROVERA) tablet 10 mg  10 mg Oral q8h Nathanial Millman, MD   10 mg at 06/15/21 0240       Allergies:  Allergies   Allergen Reactions   ??? Ceftriaxone Other (See Comments)   ??? Fentanyl Other (See Comments)     "my doctor said I had a stroke from it"   ??? Meperidine Other (See Comments)   ??? Morphine Itching   ??? Oxycodone Other (See Comments)       Review of Systems:  The Review of Systems is documented in full in the internal medical record. All systems are negative other than for those noted above.     Past Medical History:  Past Medical History:   Diagnosis Date   ??? Seizures (HCC)    ??? Sickle cell disease (HCC)    ??? Stroke [facility])        Past Surgical History:  History reviewed. No pertinent surgical history.    Social History:  Social History     Tobacco Use   ??? Smoking status: Never Smoker   ??? Smokeless tobacco: Never Used   Substance Use Topics   ??? Alcohol use: Never   ??? Drug use: Never       Family History:  No family history on file.      Physical Examination:  General Appearance: Healthy appearing patient in no acute distress.   Vital signs: BP 110/77    Pulse 100    Temp 98 ??F (36.7 ??C) (Oral)    Resp 18    Ht 5\' 8"  (1.727 m)    Wt 169 lb 6.4 oz (76.8 kg)    SpO2 94%    BMI 25.76 kg/m??   HEENT: Mild right nasal epistaxis   Neck: Supple. There is no thyromegaly.   Lymph nodes: There is no cervical, supraclavicular, axillary or inguinal adenopathy.   Breasts: Not examined  Lungs: The lungs are clear to auscultation and percussion. There is no egophony. There is no chest wall tenderness and no  use of accessory respiratory musculature.  Heart: There is no jugular venous distention. The rate is normal and rhythm regular. The S1 and S2 are normal and there are no murmurs or rubs.    Abdomen: Soft,  non-tender, bowel sounds present and normal, no appreciated hepatosplenomegaly. No palpable masses.   Skin: No rash, petechiae or ecchymoses. No evidence of malignancy.   Musculoskeletal: No bony or muscular tenderness. No joint  effusions  Extremities: No cyanosis, clubbing or edema.   Neurologic: Comprehension and speech normal. Cranial nerves intact. No focality in motor, sensory or reflex exams.     Labs:    Recent Results (from the past 24 hour(s))   Anti-Xa, Unfractionated Heparin    Collection Time: 06/14/21  4:15 PM   Result Value Ref Range    Anti-XA Unfrac Heparin >1.1 (H) 0.3 - 0.7 IU/mL   APTT    Collection Time: 06/14/21  4:15 PM   Result Value Ref Range    PTT 157.3 (H) 24.1 - 35.1 SEC   APTT    Collection Time: 06/14/21 11:59 PM   Result Value Ref Range    PTT 193.7 (HH) 24.1 - 35.1 SEC   Basic Metabolic Panel w/ Reflex to MG    Collection Time: 06/15/21  6:31 AM   Result Value Ref Range    Sodium 135 (L) 136 - 145 mmol/L    Potassium 4.6 3.5 - 5.1 mmol/L    Chloride 107 98 - 107 mmol/L    CO2 23 21 - 32 mmol/L    Anion Gap 5 (L) 7 - 16 mmol/L    Glucose 110 (H) 65 - 100 mg/dL    BUN 8 6 - 23 MG/DL    CREATININE 4.65 0.6 - 1.0 MG/DL    GFR African American >60 >60 ml/min/1.16m2    GFR Non-African American >60 >60 ml/min/1.76m2    Calcium 8.7 8.3 - 10.4 MG/DL   CBC with Auto Differential    Collection Time: 06/15/21  6:31 AM   Result Value Ref Range    WBC 7.8 4.3 - 11.1 K/uL    RBC 1.60 (L) 4.05 - 5.2 M/uL    Hemoglobin 7.0 (L) 11.7 - 15.4 g/dL    Hematocrit 03.5 (L) 35.8 - 46.3 %    MCV 121.9 (H) 79.6 - 97.8 FL    MCH 43.8 (H) 26.1 - 32.9 PG    MCHC 35.9 (H) 31.4 - 35.0 g/dL    RDW 46.5 (H) 68.1 - 14.6 %    Platelets 259 150 - 450 K/uL    MPV 10.3 9.4 - 12.3 FL    nRBC 0.66 (H) 0.0 - 0.2 K/uL    Differential Type AUTOMATED      Seg Neutrophils 59 43 - 78 %    Lymphocytes 30 13 - 44 %    Monocytes 9 4.0 - 12.0 %    Eosinophils % 1 0.5 - 7.8 %    Basophils 0 0.0 - 2.0 %    Immature Granulocytes 1 0.0 - 5.0 %    Segs Absolute 4.6 1.7 - 8.2 K/UL    Absolute Lymph # 2.4 0.5 - 4.6 K/UL    Absolute Mono # 0.7 0.1 - 1.3 K/UL    Absolute Eos # 0.1 0.0 - 0.8 K/UL    Basophils Absolute 0.0 0.0 - 0.2 K/UL    Absolute Immature Granulocyte  0.0 0.0 - 0.5 K/UL   APTT    Collection Time: 06/15/21  6:31 AM   Result Value Ref Range    PTT 92.1 (H) 24.1 - 35.1 SEC       Imaging:        ASSESSMENT:  She has a history of sickle cell disease,  perhaps sickle thal per charts. She presents with mild symptoms and complains primarily of pain in her right chest and neck, both of which are also chronic complaints. Her chronic vascular issues may be the source of her discomfort.       PLAN:  For now, hydrate and treat her for a crisis with pain meds. I have asked for a CT of her chest to better evaluate the source of her pain and her current vascular status.   I will check HGB fractionation.             Sofie Hartigan MD FACP    Oncology and Hematology Program Director  [facility]  [address]  [place], [place] [address]  P [phone]  F [phone]  [email or web address]      Elements of this note have been dictated using speech recognition software. As a result, errors of speech recognition may have occurred.

## 2021-06-15 NOTE — Progress Notes (Signed)
Heparin drip verified with offgoing RN [place].

## 2021-06-15 NOTE — Progress Notes (Signed)
Patient transferred from ED at 2300 on 06/14/2021 for continuation of care.  Chart screened by case manager for discharge planning.  No needs identified at this time.      Will continue to follow for discharge planning needs  Please consult case manager if any new issues arise     Discharge plan is home with family assistance at this time.

## 2021-06-15 NOTE — ACP (Advance Care Planning) (Signed)
Advance Care Planning     General Advance Care Planning (ACP) Conversation    Date of Conversation: 06/14/2021  Conducted with: Patient with Decision Making Capacity    Healthcare Decision Maker:    Primary Decision Maker: [name] Bright, [name] Bright - Parent - [phone]  Click here to complete Healthcare Decision Makers including selection of the Healthcare Decision Maker Relationship (ie "Primary").      

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
## 2021-06-16 LAB — COMPREHENSIVE METABOLIC PANEL
ALT: 19 U/L (ref 12–65)
AST: 35 U/L (ref 15–37)
Albumin/Globulin Ratio: 0.8 — ABNORMAL LOW (ref 1.2–3.5)
Albumin: 3.5 g/dL (ref 3.5–5.0)
Alk Phosphatase: 119 U/L (ref 50–136)
Anion Gap: 1 mmol/L — ABNORMAL LOW (ref 7–16)
BUN: 7 MG/DL (ref 6–23)
CO2: 25 mmol/L (ref 21–32)
Calcium: 8.3 MG/DL (ref 8.3–10.4)
Chloride: 109 mmol/L — ABNORMAL HIGH (ref 98–107)
Creatinine: 0.9 MG/DL (ref 0.6–1.0)
GFR African American: >60 mL/min/{1.73_m2} (ref >60)
GFR Non-African American: >60 mL/min/{1.73_m2} (ref >60)
Globulin: 4.5 g/dL — ABNORMAL HIGH (ref 2.3–3.5)
Glucose: 129 mg/dL — ABNORMAL HIGH (ref 65–100)
Potassium: 4.3 mmol/L (ref 3.5–5.1)
Sodium: 135 mmol/L — ABNORMAL LOW (ref 136–145)
Total Bilirubin: 0.9 MG/DL (ref 0.2–1.1)
Total Protein: 8 g/dL (ref 6.3–8.2)

## 2021-06-16 LAB — PREPARE RBC (CROSSMATCH)

## 2021-06-16 LAB — CBC
Hematocrit: 16.9 % — ABNORMAL LOW (ref 35.8–46.3)
Hemoglobin: 6.1 g/dL — CL (ref 11.7–15.4)
MCH: 43.9 PG — ABNORMAL HIGH (ref 26.1–32.9)
MCHC: 36.1 g/dL — ABNORMAL HIGH (ref 31.4–35.0)
MCV: 121.6 FL — ABNORMAL HIGH (ref 79.6–97.8)
MPV: 9.5 FL (ref 9.4–12.3)
Platelets: 262 10*3/uL (ref 150–450)
RBC: 1.39 M/uL — ABNORMAL LOW (ref 4.05–5.2)
RDW: 18.8 % — ABNORMAL HIGH (ref 11.9–14.6)
WBC: 7.5 10*3/uL (ref 4.3–11.1)
nRBC: 1.02 10*3/uL — ABNORMAL HIGH (ref 0.0–0.2)

## 2021-06-16 LAB — MAGNESIUM: Magnesium: 1.9 mg/dL (ref 1.8–2.4)

## 2021-06-16 LAB — IRON: Iron: 206 ug/dL — ABNORMAL HIGH (ref 35–150)

## 2021-06-16 LAB — FERRITIN: Ferritin: 826 NG/ML — ABNORMAL HIGH (ref 8–388)

## 2021-06-16 LAB — PHOSPHORUS: Phosphorus: 3.6 MG/DL (ref 2.5–4.5)

## 2021-06-16 MED ORDER — SODIUM CHLORIDE 0.9 % IV SOLN
0.9 | INTRAVENOUS | Status: DC | PRN
Start: 2021-06-16 — End: 2021-06-19

## 2021-06-16 MED ORDER — AMOXICILLIN 500 MG PO CAPS
500 MG | Freq: Three times a day (TID) | ORAL | Status: DC
Start: 2021-06-16 — End: 2021-06-19
  Administered 2021-06-16 – 2021-06-19 (×9): 1000 mg via ORAL

## 2021-06-16 MED ORDER — AZITHROMYCIN 250 MG PO TABS
250 MG | Freq: Every day | ORAL | Status: DC
Start: 2021-06-16 — End: 2021-06-19
  Administered 2021-06-17 – 2021-06-19 (×3): 250 mg via ORAL

## 2021-06-16 MED ORDER — FLUTICASONE PROPIONATE 50 MCG/ACT NA SUSP
50 MCG/ACT | Freq: Every day | NASAL | Status: DC
Start: 2021-06-16 — End: 2021-06-19
  Administered 2021-06-16 – 2021-06-19 (×4): 2 via NASAL

## 2021-06-16 MED ORDER — AZITHROMYCIN 250 MG PO TABS
250 MG | Freq: Once | ORAL | Status: AC
Start: 2021-06-16 — End: 2021-06-16
  Administered 2021-06-16: 15:00:00 500 mg via ORAL

## 2021-06-16 MED FILL — HYDROMORPHONE HCL 1 MG/ML IJ SOLN: 1 mg/mL | INTRAMUSCULAR | Qty: 1

## 2021-06-16 MED FILL — LIDOCAINE PAIN RELIEF 4 % EX PTCH: 4 % | CUTANEOUS | Qty: 1

## 2021-06-16 MED FILL — LEVETIRACETAM 500 MG PO TABS: 500 mg | ORAL | Qty: 3

## 2021-06-16 MED FILL — ACETAMINOPHEN 325 MG PO TABS: 325 mg | ORAL | Qty: 2

## 2021-06-16 MED FILL — AMOXICILLIN 500 MG PO CAPS: 500 mg | ORAL | Qty: 2

## 2021-06-16 MED FILL — AZITHROMYCIN 250 MG PO TABS: 250 mg | ORAL | Qty: 2

## 2021-06-16 MED FILL — MEDROXYPROGESTERONE ACETATE 10 MG PO TABS: 10 mg | ORAL | Qty: 1

## 2021-06-16 MED FILL — DIPHENHYDRAMINE HCL 25 MG PO CAPS: 25 mg | ORAL | Qty: 1

## 2021-06-16 MED FILL — FOLIC ACID 1 MG PO TABS: 1 mg | ORAL | Qty: 1

## 2021-06-16 MED FILL — SALONPAS PAIN RELIEVING 4 % EX PTCH: 4 % | CUTANEOUS | Qty: 1

## 2021-06-16 MED FILL — FLUTICASONE PROPIONATE 50 MCG/ACT NA SUSP: 50 MCG/ACT | NASAL | Qty: 16

## 2021-06-16 MED FILL — HYDROXYUREA 500 MG PO CAPS: 500 mg | ORAL | Qty: 2

## 2021-06-16 MED FILL — OXYCODONE-ACETAMINOPHEN 5-325 MG PO TABS: 5-325 mg | ORAL | Qty: 1

## 2021-06-16 NOTE — Other (Signed)
Informed Consent for Blood Component Transfusion Note    I have discussed with the patient the rationale for blood component transfusion; its benefits in treating or preventing fatigue, organ damage, or death; and its risk which includes mild transfusion reactions, rare risk of blood borne infection, or more serious but rare reactions. I have discussed the alternatives to transfusion, including the risk and consequences of not receiving transfusion. The patient had an opportunity to ask questions and had agreed to proceed with transfusion of blood components.    Electronically signed by Thedora Hinders, MD on 06/16/21 at 12:31 PM EDT
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
## 2021-06-16 NOTE — Progress Notes (Signed)
Hospitalist Progress Note   Admit Date:  06/14/2021  1:21 AM   Name:  [name] Bright   Age:  45 y.o.  Sex:  female  DOB:  [birth year]   MRN:  [id number]   Room:  537/01      Reason(s) for Admission: Dehydration [E86.0]  Vaginal bleeding [N93.9]  Sickle cell anemia with crisis ([place]) [D57.00]  Acute chest wall pain [R07.89]     Hospital Course & Interval History:   [name] Bright is a 45 y.o. 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 She C/o  Increase menstrual flow with clots. It is associated with Mild dizziness/lightheadedness, Mild lower abdominal discomfort.  New pleuritic chest pain, increased on movement and better at rest. Hb 8.4,D-dimer 0.51,Pregnancy test negative. Initial ekg sinus with QTC prolongation, repeat EKG sinus , mild t wave inversion lead lll,normal qtc.    Subjective/24hr Events (06/16/21):  Patient is seen at the bedside.  Complaining of headache and some nasal congestion.  Denies chest pain, palpitation, nausea, vomiting or shortness of breath.  Hemoglobin dropped to 6.1 today.  Transfuse 1 unit PRBC.      Assessment & Plan:     Menorrhagia  Ultrasound showed slight heterogeneity of endometrium and borderline thickening.   Follow-up with OB/GYN as outpatient for endometrial sampling and pap smear  - Started Provera taper 63m TID x 3 days, then BID x 3 days, then continue daily [facility]   TSH normal  Gynecology following    Chest pain/?Sickle cell crisis  - EKG with SR  - CXR neg  Continue IV fluids  Pain management with the Dilaudid  Follow-up with hemoglobin fractionation.  Continue home medication Hydrea and resume Folic acid    Community-acquired pneumonia:  CT chest suggestive of early acute pneumonia right middle lobe  Start patient on  amoxicillin/azithromycin  Patient also with nasal congestion, start patient on Flonase    Acute blood loss anemia  Likely in the setting of menorrhagia  Hemoglobin 8.4 on admission  Hemoglobin dropped to 6.1 today  Transfuse 1 unit PRBC, close monitoring  We will hold Xarelto  Currently with minimal bleeding    H/O DVT/PE  On xarelto at home , says has been for 10 odd years  Heparin drip is discontinued   Resumed Xarelto  on 7/13, will hold today  ??  Seizure  Keppra 1500 mg bid  ??  Anxiety /depression  prozac 40 mg q daily    Patient is critically ill.  Without intervention, there is a high probability of acute organ impairment or life-threatening deterioration in the patient's condition from: Acute anemia  Critical care interventions: PRBC transfusion  Total critical care time spent: 34 minutes.    Time is indicative of direct patient attendance at bedside and on the patient's floor nearby.  Includes time spent at bedside performing history and exam, performing chart review, discussing findings and treatment plan with patient and/or family, discussing patient with nursing staff, consultants and colleagues, and ordering/reviewing pertinent laboratory and radiographic evaluations.  Time excludes procedures.  CPT:  99291: First 30-74 minutes  99292: Each block of 30 min. beyond 765     Diet:  ADULT DIET; Regular  DVT PPx: Xarelto  Code status: Full Code    Hospital Problems  Last Modified POA    * (Principal) Acute chest wall pain 06/14/2021 Yes    Sickle cell anemia with crisis ([place]) 06/15/2021 Yes          Objective:     Patient Vitals for the past 24 hrs:   Temp Pulse Resp BP SpO2   06/16/21 1135 97.7 ??F (36.5 ??C) (!) 115 12 120/65 92 %   06/16/21 0800 97.8 ??F (36.6 ??C) (!) 111 16 116/60 91 %   06/16/21 0528 -- -- 18 -- --   06/16/21 0243 98.7 ??F (37.1 ??C) (!) 109 18 114/68 92 %   06/16/21 0206 -- -- 16 -- --   06/15/21 2231 97.8 ??F (36.6 ??C) 100 17 117/76 98 %   06/15/21 2030 -- -- 18 -- --   06/15/21 1950  97.4 ??F (36.3 ??C) 99 19 117/75 98 %   06/15/21 1747 -- -- 17 -- --   06/15/21 1636 -- -- 18 -- --   06/15/21 1627 98.2 ??F (36.8 ??C) 95 17 116/80 97 %       Estimated body mass index is 25.76 kg/m?? as calculated from the following:    Height as of this encounter: _0  (1.727 m).    Weight as of this encounter: 169 lb 6.4 oz (76.8 kg).    Intake/Output Summary (Last 24 hours) at 06/16/2021 1233  Last data filed at 06/16/2021 0915  Gross per 24 hour   Intake 5692 ml   Output 1375 ml   Net 4317 ml         Physical Exam:   Blood pressure 120/65, pulse (!) 115, temperature 97.7 ??F (36.5 ??C), temperature source Oral, resp. rate 12, height _1  (1.727 m), weight 169 lb 6.4 oz (76.8 kg), SpO2 92 %.  General:    Well nourished. No overt distress.  Head:  Normocephalic, atraumatic  Eyes:  Sclerae appear normal. Pupils equally round.  ENT:  Nares appear normal, no drainage. Moist oral mucosa  Neck:  No restricted ROM. Trachea midline.   CV:   RRR.  No m/r/g. No jugular venous distension.  Lungs:   CTAB.  No wheezing, rhonchi, or rales. Respirations even, unlabored.  Abdomen:   Bowel sounds present. Soft, nontender, nondistended.  Extremities: No cyanosis or clubbing. No edema.  Skin:     No rashes and normal coloration. Warm and dry.    Neuro:  CN II-XII grossly intact. Sensation intact.  A&Ox3  Psych:  Normal mood and affect.      I have reviewed ordered lab tests and independently visualized imaging below:    Recent Labs:  Recent Results (from the past 48 hour(s))   Anti-Xa, Unfractionated Heparin    Collection Time: 06/14/21  4:15 PM   Result Value Ref Range    Anti-XA Unfrac Heparin >1.1 (H) 0.3 - 0.7 IU/mL   APTT    Collection Time: 06/14/21  4:15 PM   Result Value Ref Range    PTT 157.3 (H) 24.1 - 35.1 SEC   APTT    Collection Time: 06/14/21 11:59 PM   Result Value Ref Range    PTT 193.7 (HH) 24.1 - 35.1 SEC   Basic Metabolic Panel w/ Reflex to MG    Collection Time: 06/15/21  6:31 AM   Result Value Ref Range    Sodium  135 (L) 136 - 145 mmol/L    Potassium 4.6 3.5 - 5.1 mmol/L    Chloride 107 98 - 107 mmol/L  CO2 23 21 - 32 mmol/L    Anion Gap 5 (L) 7 - 16 mmol/L    Glucose 110 (H) 65 - 100 mg/dL    BUN 8 6 - 23 MG/DL    CREATININE 0.80 0.6 - 1.0 MG/DL    GFR African American >60 >60 ml/min/1.63m    GFR Non-African American >60 >60 ml/min/1.729m   Calcium 8.7 8.3 - 10.4 MG/DL   CBC with Auto Differential    Collection Time: 06/15/21  6:31 AM   Result Value Ref Range    WBC 7.8 4.3 - 11.1 K/uL    RBC 1.60 (L) 4.05 - 5.2 M/uL    Hemoglobin 7.0 (L) 11.7 - 15.4 g/dL    Hematocrit 19.5 (L) 35.8 - 46.3 %    MCV 121.9 (H) 79.6 - 97.8 FL    MCH 43.8 (H) 26.1 - 32.9 PG    MCHC 35.9 (H) 31.4 - 35.0 g/dL    RDW 17.8 (H) 11.9 - 14.6 %    Platelets 259 150 - 450 K/uL    MPV 10.3 9.4 - 12.3 FL    nRBC 0.66 (H) 0.0 - 0.2 K/uL    Differential Type AUTOMATED      Seg Neutrophils 59 43 - 78 %    Lymphocytes 30 13 - 44 %    Monocytes 9 4.0 - 12.0 %    Eosinophils % 1 0.5 - 7.8 %    Basophils 0 0.0 - 2.0 %    Immature Granulocytes 1 0.0 - 5.0 %    Segs Absolute 4.6 1.7 - 8.2 K/UL    Absolute Lymph # 2.4 0.5 - 4.6 K/UL    Absolute Mono # 0.7 0.1 - 1.3 K/UL    Absolute Eos # 0.1 0.0 - 0.8 K/UL    Basophils Absolute 0.0 0.0 - 0.2 K/UL    Absolute Immature Granulocyte 0.0 0.0 - 0.5 K/UL   APTT    Collection Time: 06/15/21  6:31 AM   Result Value Ref Range    PTT 92.1 (H) 24.1 - 35.1 SEC   TSH with Reflex    Collection Time: 06/15/21 10:16 AM   Result Value Ref Range    TSH w Free Thyroid if Abnormal 2.00 0.358 - 3.740 UIU/ML   CBC    Collection Time: 06/16/21  6:44 AM   Result Value Ref Range    WBC 7.5 4.3 - 11.1 K/uL    RBC 1.39 (L) 4.05 - 5.2 M/uL    Hemoglobin 6.1 (LL) 11.7 - 15.4 g/dL    Hematocrit 16.9 (L) 35.8 - 46.3 %    MCV 121.6 (H) 79.6 - 97.8 FL    MCH 43.9 (H) 26.1 - 32.9 PG    MCHC 36.1 (H) 31.4 - 35.0 g/dL    RDW 18.8 (H) 11.9 - 14.6 %    Platelets 262 150 - 450 K/uL    MPV 9.5 9.4 - 12.3 FL    nRBC 1.02 (H) 0.0 - 0.2 K/uL    Comprehensive Metabolic Panel    Collection Time: 06/16/21  6:44 AM   Result Value Ref Range    Sodium 135 (L) 136 - 145 mmol/L    Potassium 4.3 3.5 - 5.1 mmol/L    Chloride 109 (H) 98 - 107 mmol/L    CO2 25 21 - 32 mmol/L    Anion Gap 1 (L) 7 - 16 mmol/L    Glucose 129 (H) 65 - 100 mg/dL    BUN 7  6 - 23 MG/DL    CREATININE 0.90 0.6 - 1.0 MG/DL    GFR African American >60 >60 ml/min/1.79m    GFR Non-African American >60 >60 ml/min/1.765m   Calcium 8.3 8.3 - 10.4 MG/DL    Total Bilirubin 0.9 0.2 - 1.1 MG/DL    ALT 19 12 - 65 U/L    AST 35 15 - 37 U/L    Alk Phosphatase 119 50 - 136 U/L    Total Protein 8.0 6.3 - 8.2 g/dL    Albumin 3.5 3.5 - 5.0 g/dL    Globulin 4.5 (H) 2.3 - 3.5 g/dL    Albumin/Globulin Ratio 0.8 (L) 1.2 - 3.5     Magnesium    Collection Time: 06/16/21  6:44 AM   Result Value Ref Range    Magnesium 1.9 1.8 - 2.4 mg/dL   Phosphorus    Collection Time: 06/16/21  6:44 AM   Result Value Ref Range    Phosphorus 3.6 2.5 - 4.5 MG/DL   Iron    Collection Time: 06/16/21  6:44 AM   Result Value Ref Range    Iron 206 (H) 35 - 150 ug/dL   Ferritin    Collection Time: 06/16/21  6:44 AM   Result Value Ref Range    Ferritin 826 (H) 8 - 388 NG/ML         Other Studies:  XR CHEST (2 VW)    Result Date: 06/14/2021  EXAM: XR CHEST (2 VW) HISTORY: sickle cell.  TECHNIQUE: Frontal and lateral chest. COMPARISON: 11/21/2020 FINDINGS: The cardiac silhouette, mediastinum, and pulmonary vasculature are within normal limits. There is no consolidation, pleural effusion, or pneumothorax. No significant osseous abnormalities are observed.     No evidence of an acute intrathoracic process.     [place]    Result Date: 06/14/2021  PELVIC ULTRASOUND. HISTORY:  Pelvic pain TECHNIQUE:  Transabdominal and endovaginal  images were obtained of the pelvis. FINDINGS:  - UTERUS:  7.5 cm in length.  Endometrial stripe measures 13 mm.  Endometrium is slightly heterogeneous.  No discrete myometrial or endometrial mass is seen. -   RIGHT OVARY: Never identified.  No significant right pelvic mass.  -  LEFT OVARY:  3.1 x 1.3 x 2.3 cm.  No significant adnexal mass.  Normal blood flow. No significant free fluid.     Slight heterogeneity of endometrium and borderline thickening. Consider follow-up to exclude residual tumor/hyperplasia.       Current Meds:  Current Facility-Administered Medications   Medication Dose Route Frequency   ??? 0.9 % sodium chloride infusion   IntraVENous PRN   ??? [START ON 06/17/2021] azithromycin (ZITHROMAX) tablet 250 mg  250 mg Oral Daily   ??? fluticasone (FLONASE) 50 MCG/ACT nasal spray 2 spray  2 spray Each Nostril Daily   ??? diphenhydrAMINE (BENADRYL) capsule 25 mg  25 mg Oral Q6H PRN   ??? [Held by provider] rivaroxaban (XARELTO) tablet 20 mg  20 mg Oral Daily with breakfast   ??? dextrose 5 % and 0.45 % sodium chloride infusion   IntraVENous Continuous   ??? folic acid (FOLVITE) tablet 1 mg  1 mg Oral Daily   ??? hydroxyurea (HYDREA) chemo capsule 1,000 mg  1,000 mg Oral Daily   ??? levETIRAcetam (KEPPRA) tablet 1,500 mg  1,500 mg Oral BID   ??? lidocaine 4 % external patch 1 patch  1 patch TransDERmal Daily   ??? sodium chloride flush 0.9 % injection 5-40 mL  5-40  mL IntraVENous 2 times per day   ??? sodium chloride flush 0.9 % injection 5-40 mL  5-40 mL IntraVENous PRN   ??? 0.9 % sodium chloride infusion   IntraVENous PRN   ??? ondansetron (ZOFRAN-ODT) disintegrating tablet 4 mg  4 mg Oral Q8H PRN    Or   ??? ondansetron (ZOFRAN) injection 4 mg  4 mg IntraVENous Q6H PRN   ??? polyethylene glycol (GLYCOLAX) packet 17 g  17 g Oral Daily PRN   ??? acetaminophen (TYLENOL) tablet 650 mg  650 mg Oral Q6H PRN    Or   ??? acetaminophen (TYLENOL) suppository 650 mg  650 mg Rectal Q6H PRN   ??? HYDROmorphone HCl PF (DILAUDID) injection 1 mg  1 mg IntraVENous Q4H PRN   ??? oxyCODONE-acetaminophen (PERCOCET) 5-325 MG per tablet 1 tablet  1 tablet Oral Q4H PRN   ??? medroxyPROGESTERone (PROVERA) tablet 10 mg  10 mg Oral q8h       Signed:  Aloys Hupfer, MD    Part  of this note may have been written by using a voice dictation software.  The note has been proof read but may still contain some grammatical/other typographical errors.

## 2021-06-16 NOTE — Progress Notes (Signed)
END OF SHIFT SUMMARY:      Additional events this shift:   -pt given dilaudid 3x  -pt resting in bed  -no needs at this time  -vss    I/Os:    07/13 1901 - 07/14 0700  In: 2116 [P.O.:500; I.V.:1616]  Out: 1200 [Urine:1200]

## 2021-06-17 ENCOUNTER — Inpatient Hospital Stay: Admit: 2021-06-17 | Payer: MEDICARE | Primary: Student in an Organized Health Care Education/Training Program

## 2021-06-17 LAB — COMPREHENSIVE METABOLIC PANEL
ALT: 22 U/L (ref 12–65)
AST: 67 U/L — ABNORMAL HIGH (ref 15–37)
Albumin/Globulin Ratio: 0.8 — ABNORMAL LOW (ref 1.2–3.5)
Albumin: 3.3 g/dL — ABNORMAL LOW (ref 3.5–5.0)
Alk Phosphatase: 100 U/L (ref 50–136)
Anion Gap: 3 mmol/L — ABNORMAL LOW (ref 7–16)
BUN: 8 MG/DL (ref 6–23)
CO2: 21 mmol/L (ref 21–32)
Calcium: 8.2 MG/DL — ABNORMAL LOW (ref 8.3–10.4)
Chloride: 113 mmol/L — ABNORMAL HIGH (ref 98–107)
Creatinine: 0.8 MG/DL (ref 0.6–1.0)
GFR African American: >60 mL/min/{1.73_m2} (ref >60)
GFR Non-African American: >60 mL/min/{1.73_m2} (ref >60)
Globulin: 4.4 g/dL — ABNORMAL HIGH (ref 2.3–3.5)
Glucose: 104 mg/dL — ABNORMAL HIGH (ref 65–100)
Potassium: 4.7 mmol/L (ref 3.5–5.1)
Sodium: 137 mmol/L (ref 136–145)
Total Bilirubin: 0.8 MG/DL (ref 0.2–1.1)
Total Protein: 7.7 g/dL (ref 6.3–8.2)

## 2021-06-17 LAB — TYPE AND SCREEN
ABO/Rh: O POS
Antibody Screen: NEGATIVE
Antigen/Antibody: NEGATIVE
Dispense Status Blood Bank: TRANSFUSED
Unit Divison: 0

## 2021-06-17 LAB — PHOSPHORUS: Phosphorus: 2.8 MG/DL (ref 2.5–4.5)

## 2021-06-17 LAB — MAGNESIUM: Magnesium: 1.9 mg/dL (ref 1.8–2.4)

## 2021-06-17 MED ORDER — IOPAMIDOL 76 % IV SOLN
76 % | Freq: Once | INTRAVENOUS | Status: AC | PRN
Start: 2021-06-17 — End: 2021-06-17
  Administered 2021-06-17: 19:00:00 100 mL via INTRAVENOUS

## 2021-06-17 MED FILL — HYDROMORPHONE HCL 1 MG/ML IJ SOLN: 1 mg/mL | INTRAMUSCULAR | Qty: 1

## 2021-06-17 MED FILL — MEDROXYPROGESTERONE ACETATE 10 MG PO TABS: 10 mg | ORAL | Qty: 1

## 2021-06-17 MED FILL — OXYCODONE-ACETAMINOPHEN 5-325 MG PO TABS: 5-325 mg | ORAL | Qty: 1

## 2021-06-17 MED FILL — AMOXICILLIN 500 MG PO CAPS: 500 mg | ORAL | Qty: 2

## 2021-06-17 MED FILL — HYDROXYUREA 500 MG PO CAPS: 500 mg | ORAL | Qty: 2

## 2021-06-17 MED FILL — FOLIC ACID 1 MG PO TABS: 1 mg | ORAL | Qty: 1

## 2021-06-17 MED FILL — LEVETIRACETAM 500 MG PO TABS: 500 mg | ORAL | Qty: 3

## 2021-06-17 MED FILL — AZITHROMYCIN 250 MG PO TABS: 250 mg | ORAL | Qty: 1

## 2021-06-17 MED FILL — LIDOCAINE PAIN RELIEF 4 % EX PTCH: 4 % | CUTANEOUS | Qty: 1

## 2021-06-17 NOTE — Progress Notes (Signed)
Hospitalist Progress Note   Admit Date:  06/14/2021  1:21 AM   Name:  [name] Bright   Age:  45 y.o.  Sex:  female  DOB:  [birth year]   MRN:  [id number]   Room:  537/01      Reason(s) for Admission: Dehydration [E86.0]  Vaginal bleeding [N93.9]  Sickle cell anemia with crisis ([place]) [D57.00]  Acute chest wall pain [R07.89]     Hospital Course & Interval History:   [name] Bright is a 45 y.o. 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  Admitted with symptomatic anemia, menorrhagia and pleuritic chest pain, ?  Sickle cell crisis.      Subjective/24hr Events (06/17/21):  Patient is seen at the bedside.  Reports headache comes and goes.  Chest pain is better today.  Complaining of right neck swelling and pain, new onset.  Reports feeling weak and tired.  Status post 1 PRBC transfusion yesterday.  Pending morning labs.  Remains with vaginal bleeding but improved.    Assessment & Plan:     Menorrhagia  Ultrasound showed slight heterogeneity of endometrium and borderline thickening.   Follow-up with OB/GYN as outpatient for endometrial sampling and pap smear  - Started Provera taper 21m TID x 3 days, then BID x 3 days, then continue daily [facility]   TSH normal  Gynecology signed off    Chest pain/?Sickle cell crisis  - EKG with SR  - CXR neg  Continue IV fluids  Pain management with the Dilaudid  Continue home medication Hydrea and resume Folic acid  Oncology following, appreciate recommendations    Community-acquired pneumonia:  CT chest suggestive of early acute pneumonia right middle lobe  Started on amoxicillin/azithromycin  Patient also with nasal congestion, start patient on Flonase    Right neck swelling:  Tender to palpate  X-ray neck soft tissue ordered    Acute blood  loss anemia  Likely in the setting of menorrhagia  Hemoglobin 8.4 on admission  Hemoglobin dropped to 6.1 on 7/14, status post 1 unit PRBC transfusion  Xarelto held  Pending morning labs, follow-up    H/O DVT/PE  On xarelto at home , says has been for 10 odd years  Heparin drip is discontinued   Xarelto resumed on 7/13 but due to drop in hemoglobin held again on 7/14     Seizure  Keppra 1500 mg bid     Anxiety /depression  prozac 40 mg q daily    Diet:  ADULT DIET; Regular  DVT PPx: Xarelto  Code status: Full Code    Hospital Problems             Last Modified POA    * (Principal) Acute chest wall pain 06/14/2021 Yes    [place] 06/15/2021 Yes     Objective:     Patient Vitals for the past 24 hrs:   Temp Pulse Resp BP SpO2   06/17/21 1033 98.3 ??F (36.8 ??C) (!) 110 18 118/77 97 %   06/17/21 0812 98.3 ??F (36.8 ??C) (!) 106 18 (!) 138/90 97 %   06/17/21 0743 98.2 ??F (36.8 ??C) (!) 106 18 (!) 138/90 97 %   06/17/21 0357 98.9 ??F (37.2 ??C) (!) 108 18 124/78 95 %  06/16/21 2209 98.5 ??F (36.9 ??C) (!) 108 18 106/70 93 %   06/16/21 1949 98.5 ??F (36.9 ??C) (!) 108 18 100/69 91 %   06/16/21 1730 98.2 ??F (36.8 ??C) (!) 105 16 104/76 91 %   06/16/21 1645 98.2 ??F (36.8 ??C) (!) 110 16 113/76 92 %   06/16/21 1505 98.5 ??F (36.9 ??C) (!) 107 14 99/67 98 %         Estimated body mass index is 25.76 kg/m?? as calculated from the following:    Height as of this encounter: '5\' 8"'  (1.727 m).    Weight as of this encounter: 169 lb 6.4 oz (76.8 kg).    Intake/Output Summary (Last 24 hours) at 06/17/2021 1243  Last data filed at 06/17/2021 1032  Gross per 24 hour   Intake 2005 ml   Output 1750 ml   Net 255 ml           Physical Exam:   Blood pressure 118/77, pulse (!) 110, temperature 98.3 ??F (36.8 ??C), temperature source Oral, resp. rate 18, height '5\' 8"'  (1.727 m), weight 169 lb 6.4 oz (76.8 kg), SpO2 97 %.  General:    No overt distress.  Head:  Normocephalic, atraumatic  Eyes:  Sclerae appear normal. Pupils equally round.   Exophthalmos  ENT:  Nares appear normal, no drainage. Moist oral mucosa  Neck:  No restricted ROM. Trachea midline. Right neck soft tissue swelling, tender to palpate  CV:   RRR.  No m/r/g. No jugular venous distension.  Lungs:   CTAB.  No wheezing, rhonchi, or rales. Respirations even, unlabored.  Abdomen:   Bowel sounds present. Soft, nontender, nondistended.  Extremities: No cyanosis or clubbing. No edema.  Skin:     No rashes and normal coloration. Warm and dry.    Neuro:  CN II-XII grossly intact. Sensation intact.  A&Ox3  Psych:  Normal mood and affect.      I have reviewed ordered lab tests and independently visualized imaging below:    Recent Labs:  Recent Results (from the past 48 hour(s))   CBC    Collection Time: 06/16/21  6:44 AM   Result Value Ref Range    WBC 7.5 4.3 - 11.1 K/uL    RBC 1.39 (L) 4.05 - 5.2 M/uL    Hemoglobin 6.1 (LL) 11.7 - 15.4 g/dL    Hematocrit 16.9 (L) 35.8 - 46.3 %    MCV 121.6 (H) 79.6 - 97.8 FL    MCH 43.9 (H) 26.1 - 32.9 PG    MCHC 36.1 (H) 31.4 - 35.0 g/dL    RDW 18.8 (H) 11.9 - 14.6 %    Platelets 262 150 - 450 K/uL    MPV 9.5 9.4 - 12.3 FL    nRBC 1.02 (H) 0.0 - 0.2 K/uL   Comprehensive Metabolic Panel    Collection Time: 06/16/21  6:44 AM   Result Value Ref Range    Sodium 135 (L) 136 - 145 mmol/L    Potassium 4.3 3.5 - 5.1 mmol/L    Chloride 109 (H) 98 - 107 mmol/L    CO2 25 21 - 32 mmol/L    Anion Gap 1 (L) 7 - 16 mmol/L    Glucose 129 (H) 65 - 100 mg/dL    BUN 7 6 - 23 MG/DL    CREATININE 0.90 0.6 - 1.0 MG/DL    GFR African American >60 >60 ml/min/1.31m    GFR Non-African American >60 >60 ml/min/1.722m  Calcium 8.3 8.3 - 10.4 MG/DL    Total Bilirubin 0.9 0.2 - 1.1 MG/DL    ALT 19 12 - 65 U/L    AST 35 15 - 37 U/L    Alk Phosphatase 119 50 - 136 U/L    Total Protein 8.0 6.3 - 8.2 g/dL    Albumin 3.5 3.5 - 5.0 g/dL    Globulin 4.5 (H) 2.3 - 3.5 g/dL    Albumin/Globulin Ratio 0.8 (L) 1.2 - 3.5     Magnesium    Collection Time: 06/16/21  6:44 AM   Result Value Ref Range     Magnesium 1.9 1.8 - 2.4 mg/dL   Phosphorus    Collection Time: 06/16/21  6:44 AM   Result Value Ref Range    Phosphorus 3.6 2.5 - 4.5 MG/DL   Iron    Collection Time: 06/16/21  6:44 AM   Result Value Ref Range    Iron 206 (H) 35 - 150 ug/dL   Ferritin    Collection Time: 06/16/21  6:44 AM   Result Value Ref Range    Ferritin 826 (H) 8 - 388 NG/ML   PREPARE RBC (CROSSMATCH), 1 Units    Collection Time: 06/16/21  8:00 AM   Result Value Ref Range    History Check Historical check performed    TYPE AND SCREEN    Collection Time: 06/16/21  9:32 AM   Result Value Ref Range    Crossmatch expiration date 06/19/2021,2359     ABO/Rh O POSITIVE     Antibody Screen NEG     Unit Number [id number]     Product Code Blood Bank RC LR     Unit Divison 00     Dispense Status Blood Bank TRANSFUSED     Antigen/Antibody       C NEGATIVE,  E NEGATIVE,  KELL NEGATIVE,  FY(A) NEGATIVE,  FY(B) NEGATIVE,  Jk(B) NEGATIVE,  SICKLEDEX NEGATIVE      Crossmatch Result Compatible    Comprehensive Metabolic Panel    Collection Time: 06/17/21  7:54 AM   Result Value Ref Range    Sodium 137 136 - 145 mmol/L    Potassium 4.7 3.5 - 5.1 mmol/L    Chloride 113 (H) 98 - 107 mmol/L    CO2 21 21 - 32 mmol/L    Anion Gap 3 (L) 7 - 16 mmol/L    Glucose 104 (H) 65 - 100 mg/dL    BUN 8 6 - 23 MG/DL    CREATININE 0.80 0.6 - 1.0 MG/DL    GFR African American >60 >60 ml/min/1.37m    GFR Non-African American >60 >60 ml/min/1.781m   Calcium 8.2 (L) 8.3 - 10.4 MG/DL    Total Bilirubin 0.8 0.2 - 1.1 MG/DL    ALT 22 12 - 65 U/L    AST 67 (H) 15 - 37 U/L    Alk Phosphatase 100 50 - 136 U/L    Total Protein 7.7 6.3 - 8.2 g/dL    Albumin 3.3 (L) 3.5 - 5.0 g/dL    Globulin 4.4 (H) 2.3 - 3.5 g/dL    Albumin/Globulin Ratio 0.8 (L) 1.2 - 3.5     Magnesium    Collection Time: 06/17/21  7:54 AM   Result Value Ref Range    Magnesium 1.9 1.8 - 2.4 mg/dL   Phosphorus    Collection Time: 06/17/21  7:54 AM   Result Value Ref Range    Phosphorus 2.8 2.5 - 4.5 MG/DL  Other Studies:  XR CHEST (2 VW)    Result Date: 06/14/2021  EXAM: XR CHEST (2 VW) HISTORY: sickle cell.  TECHNIQUE: Frontal and lateral chest. COMPARISON: 11/21/2020 FINDINGS: The cardiac silhouette, mediastinum, and pulmonary vasculature are within normal limits. There is no consolidation, pleural effusion, or pneumothorax. No significant osseous abnormalities are observed.     No evidence of an acute intrathoracic process.     US PELVIS COMPLETE    Result Date: 06/14/2021  PELVIC ULTRASOUND. HISTORY:  Pelvic pain TECHNIQUE:  Transabdominal and endovaginal  images were obtained of the pelvis. FINDINGS:  - UTERUS:  7.5 cm in length.  Endometrial stripe measures 13 mm.  Endometrium is slightly heterogeneous.  No discrete myometrial or endometrial mass is seen. -  RIGHT OVARY: Never identified.  No significant right pelvic mass.  -  LEFT OVARY:  3.1 x 1.3 x 2.3 cm.  No significant adnexal mass.  Normal blood flow. No significant free fluid.     Slight heterogeneity of endometrium and borderline thickening. Consider follow-up to exclude residual tumor/hyperplasia.       Current Meds:  Current Facility-Administered Medications   Medication Dose Route Frequency    0.9 % sodium chloride infusion   IntraVENous PRN    azithromycin (ZITHROMAX) tablet 250 mg  250 mg Oral Daily    fluticasone (FLONASE) 50 MCG/ACT nasal spray 2 spray  2 spray Each Nostril Daily    amoxicillin (AMOXIL) capsule 1,000 mg  1,000 mg Oral 3 times per day    diphenhydrAMINE (BENADRYL) capsule 25 mg  25 mg Oral Q6H PRN    [Held by provider] rivaroxaban (XARELTO) tablet 20 mg  20 mg Oral Daily with breakfast    dextrose 5 % and 0.45 % sodium chloride infusion   IntraVENous Continuous    folic acid (FOLVITE) tablet 1 mg  1 mg Oral Daily    hydroxyurea (HYDREA) chemo capsule 1,000 mg  1,000 mg Oral Daily    levETIRAcetam (KEPPRA) tablet 1,500 mg  1,500 mg Oral BID    lidocaine 4 % external patch 1 patch  1 patch TransDERmal Daily    sodium chloride  flush 0.9 % injection 5-40 mL  5-40 mL IntraVENous 2 times per day    sodium chloride flush 0.9 % injection 5-40 mL  5-40 mL IntraVENous PRN    0.9 % sodium chloride infusion   IntraVENous PRN    ondansetron (ZOFRAN-ODT) disintegrating tablet 4 mg  4 mg Oral Q8H PRN    Or    ondansetron (ZOFRAN) injection 4 mg  4 mg IntraVENous Q6H PRN    polyethylene glycol (GLYCOLAX) packet 17 g  17 g Oral Daily PRN    acetaminophen (TYLENOL) tablet 650 mg  650 mg Oral Q6H PRN    Or    acetaminophen (TYLENOL) suppository 650 mg  650 mg Rectal Q6H PRN    HYDROmorphone HCl PF (DILAUDID) injection 1 mg  1 mg IntraVENous Q4H PRN    oxyCODONE-acetaminophen (PERCOCET) 5-325 MG per tablet 1 tablet  1 tablet Oral Q4H PRN    medroxyPROGESTERone (PROVERA) tablet 10 mg  10 mg Oral q8h       Signed:  Antario Yasuda, MD    Part of this note may have been written by using a voice dictation software.  The note has been proof read but may still contain some grammatical/other typographical errors.

## 2021-06-17 NOTE — Progress Notes (Signed)
Reviewed notes for new spiritual concerns.

## 2021-06-17 NOTE — Care Coordination-Inpatient (Signed)
Chart reviewed by [facility] for discharge planning.  Patient c/o ha and weakness. MD would like to monitor for additional night.     Discharge plan is home with family assistance     Will continue to follow for discharge planning needs  Please consult case manager if any new issues arise

## 2021-06-17 NOTE — Progress Notes (Signed)
EOS: VSS. Medicated 4 times alt Percocet PO and Dilaudid IV prn for right arm discomfort. Pt reports continued bleeding vaginally. Pt also states right neck swelling increasing. Report given to oncoming RN. Continue with POC.

## 2021-06-18 LAB — CBC
Hematocrit: 21 % — ABNORMAL LOW (ref 35.8–46.3)
Hemoglobin: 7.4 g/dL — ABNORMAL LOW (ref 11.7–15.4)
MCH: 40.2 PG — ABNORMAL HIGH (ref 26.1–32.9)
MCHC: 35.2 g/dL — ABNORMAL HIGH (ref 31.4–35.0)
MCV: 114.1 FL — ABNORMAL HIGH (ref 79.6–97.8)
MPV: 9.2 FL — ABNORMAL LOW (ref 9.4–12.3)
Platelets: 275 10*3/uL (ref 150–450)
RBC: 1.84 M/uL — ABNORMAL LOW (ref 4.05–5.2)
RDW: 24.7 % — ABNORMAL HIGH (ref 11.9–14.6)
WBC: 5.8 10*3/uL (ref 4.3–11.1)
nRBC: 0.89 10*3/uL — ABNORMAL HIGH (ref 0.0–0.2)

## 2021-06-18 LAB — COMPREHENSIVE METABOLIC PANEL
ALT: 19 U/L (ref 12–65)
AST: 26 U/L (ref 15–37)
Albumin/Globulin Ratio: 0.7 — ABNORMAL LOW (ref 1.2–3.5)
Albumin: 3.2 g/dL — ABNORMAL LOW (ref 3.5–5.0)
Alk Phosphatase: 98 U/L (ref 50–136)
Anion Gap: 6 mmol/L — ABNORMAL LOW (ref 7–16)
BUN: 5 MG/DL — ABNORMAL LOW (ref 6–23)
CO2: 23 mmol/L (ref 21–32)
Calcium: 8.8 MG/DL (ref 8.3–10.4)
Chloride: 112 mmol/L — ABNORMAL HIGH (ref 98–107)
Creatinine: 0.8 MG/DL (ref 0.6–1.0)
GFR African American: >60 mL/min/{1.73_m2} (ref >60)
GFR Non-African American: >60 mL/min/{1.73_m2} (ref >60)
Globulin: 4.3 g/dL — ABNORMAL HIGH (ref 2.3–3.5)
Glucose: 134 mg/dL — ABNORMAL HIGH (ref 65–100)
Potassium: 3.3 mmol/L — ABNORMAL LOW (ref 3.5–5.1)
Sodium: 141 mmol/L (ref 136–145)
Total Bilirubin: 1.1 MG/DL (ref 0.2–1.1)
Total Protein: 7.5 g/dL (ref 6.3–8.2)

## 2021-06-18 LAB — CBC WITH AUTO DIFFERENTIAL
Absolute Eos #: 0.2 10*3/uL (ref 0.0–0.8)
Absolute Immature Granulocyte: 0 10*3/uL (ref 0.0–0.5)
Absolute Lymph #: 1.2 10*3/uL (ref 0.5–4.6)
Absolute Mono #: 0.8 10*3/uL (ref 0.1–1.3)
Basophils Absolute: 0 10*3/uL (ref 0.0–0.2)
Basophils: 0 % (ref 0.0–2.0)
Eosinophils %: 3 % (ref 0.5–7.8)
Hematocrit: 20.6 % — ABNORMAL LOW (ref 35.8–46.3)
Hemoglobin: 7.4 g/dL — ABNORMAL LOW (ref 11.7–15.4)
Immature Granulocytes: 0 % (ref 0.0–5.0)
Lymphocytes: 23 % (ref 13–44)
MCH: 40.9 PG — ABNORMAL HIGH (ref 26.1–32.9)
MCHC: 35.9 g/dL — ABNORMAL HIGH (ref 31.4–35.0)
MCV: 113.8 FL — ABNORMAL HIGH (ref 79.6–97.8)
MPV: 9.1 FL — ABNORMAL LOW (ref 9.4–12.3)
Monocytes: 15 % — ABNORMAL HIGH (ref 4.0–12.0)
Platelet Comment: ADEQUATE
Platelets: 265 10*3/uL (ref 150–450)
RBC: 1.81 M/uL — ABNORMAL LOW (ref 4.05–5.2)
Seg Neutrophils: 59 % (ref 43–78)
Segs Absolute: 3.1 10*3/uL (ref 1.7–8.2)
WBC: 5.3 10*3/uL (ref 4.3–11.1)
nRBC: 1.06 10*3/uL — ABNORMAL HIGH (ref 0.0–0.2)

## 2021-06-18 LAB — MISCELLANEOUS SENDOUT

## 2021-06-18 LAB — MAGNESIUM: Magnesium: 1.9 mg/dL (ref 1.8–2.4)

## 2021-06-18 LAB — PHOSPHORUS: Phosphorus: 2.7 MG/DL (ref 2.5–4.5)

## 2021-06-18 MED ORDER — MEDROXYPROGESTERONE ACETATE 10 MG PO TABS
10 MG | Freq: Every day | ORAL | Status: DC
Start: 2021-06-18 — End: 2021-06-19

## 2021-06-18 MED ORDER — OXYCODONE HCL 5 MG PO TABS
5 MG | ORAL | Status: DC | PRN
Start: 2021-06-18 — End: 2021-06-19
  Administered 2021-06-18 – 2021-06-19 (×3): 10 mg via ORAL

## 2021-06-18 MED ORDER — MEDROXYPROGESTERONE ACETATE 10 MG PO TABS
10 MG | Freq: Two times a day (BID) | ORAL | Status: DC
Start: 2021-06-18 — End: 2021-06-19

## 2021-06-18 MED ORDER — MEDROXYPROGESTERONE ACETATE 10 MG PO TABS
10 MG | Freq: Two times a day (BID) | ORAL | Status: DC
Start: 2021-06-18 — End: 2021-06-19
  Administered 2021-06-19 (×2): 5 mg via ORAL

## 2021-06-18 MED FILL — OXYCODONE HCL 5 MG PO TABS: 5 mg | ORAL | Qty: 2

## 2021-06-18 MED FILL — LEVETIRACETAM 500 MG PO TABS: 500 mg | ORAL | Qty: 3

## 2021-06-18 MED FILL — AMOXICILLIN 500 MG PO CAPS: 500 mg | ORAL | Qty: 2

## 2021-06-18 MED FILL — XARELTO 20 MG PO TABS: 20 mg | ORAL | Qty: 1

## 2021-06-18 MED FILL — MEDROXYPROGESTERONE ACETATE 10 MG PO TABS: 10 mg | ORAL | Qty: 1

## 2021-06-18 MED FILL — OXYCODONE-ACETAMINOPHEN 5-325 MG PO TABS: 5-325 mg | ORAL | Qty: 1

## 2021-06-18 MED FILL — FOLIC ACID 1 MG PO TABS: 1 mg | ORAL | Qty: 1

## 2021-06-18 MED FILL — LIDOCAINE PAIN RELIEF 4 % EX PTCH: 4 % | CUTANEOUS | Qty: 1

## 2021-06-18 MED FILL — HYDROMORPHONE HCL 1 MG/ML IJ SOLN: 1 mg/mL | INTRAMUSCULAR | Qty: 1

## 2021-06-18 MED FILL — HYDROXYUREA 500 MG PO CAPS: 500 mg | ORAL | Qty: 2

## 2021-06-18 MED FILL — AZITHROMYCIN 250 MG PO TABS: 250 mg | ORAL | Qty: 1

## 2021-06-18 NOTE — Progress Notes (Signed)
US Guided PIV access    Called for assistance with IV access.   Patient requests PIV in right UE due to pain and swelling from infiltrated IVs in left upper arm and forearm.     Ultrasound was used to find the vein which was compressible and does not have any features of an artery or nerve bundle. Skin was cleaned and disinfected prior to IV puncture. Under real-time ultrasound guidance peripheral access was obtained in the right basilic using 20 G 6 CM Extended Dwell Peripheral IV catheter LOT # [id number]  x 2 attempts, failed attempt in right AC. Blood return was present and IV flushed without difficulty. IV dressing applied, no immediate complications noted, and patient tolerated the procedure well.    Lorine Bears, RN, PCCN, VA-BC
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
## 2021-06-18 NOTE — Plan of Care (Signed)
Problem: Pain  Goal: Verbalizes/displays adequate comfort level or baseline comfort level  Outcome: Progressing     Problem: Safety - Adult  Goal: Free from fall injury  Outcome: Progressing  Flowsheets (Taken 06/18/2021 1514)  Free From Fall Injury: Based on caregiver fall risk screen, instruct family/caregiver to ask for assistance with transferring infant if caregiver noted to have fall risk factors     Problem: ABCDS Injury Assessment  Goal: Absence of physical injury  Outcome: Progressing  Flowsheets (Taken 06/18/2021 1514)  Absence of Physical Injury: Implement safety measures based on patient assessment

## 2021-06-18 NOTE — Progress Notes (Signed)
Hospitalist Progress Note   Admit Date:  06/14/2021  1:21 AM   Name:  [name] Bright   Age:  45 y.o.  Sex:  female  DOB:  1976/03/19   MRN:  [id number]   Room:  537/01      Reason(s) for Admission: Dehydration [E86.0]  Vaginal bleeding [N93.9]  Sickle cell anemia with crisis ([place]) [D57.00]  Acute chest wall pain [R07.89]     Hospital Course & Interval History:   Miliani Deike is a 45 y.o. 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    Patient is admitted with symptomatic anemia, menorrhagia and pleuritic chest pain, ?  Sickle cell crisis.  OB/GYN was consulted.  Pelvic ultrasound was obtained and showed normal uterus size but slightly thickened endometrium that is heterogeneous.  OB/GYN Dr.Ruiz recommended for her to start Provera on 7/12 taper 10 mg 3 times daily x3 days, then twice daily for 3 days, then continue daily and to follow-up in her office at [facility].  Plans for biopsy as outpatient. She has a history of DVT in the past and was recommended to continue Xarelto indefinitely.  OB/GYN was okay for patient to continue anticoagulation.  Xarelto was held initially due to uterine bleeding and she was started on heparin drip.    Hematology was also consulted for chest pain with concerns for possible sickle cell crisis with her history of sickle cell disease.  Her pain all appear to be chronic.  Her chronic vascular issues may be the source of her discomfort. CXR was unremarkable.  Recommended to continue Hydrea and resume folic acid.  She has chronic neck pain with CT neck and chest repeat here showing prevertebral soft tissue edema without peripheral enhancement to suggest focal  abscess; no bony erosions to suggest discitis; etiology of this edema is unclear.    Her hemoglobin at baseline is 8-9.  Iron studies were unremarkable.  Heparin drip was discontinued and sent home Xarelto was resumed on 7/13.  Her hemoglobin dropped down to 6.1 on 7/14 most likely due to recent menorrhagia and also hemodilution with aggressive IVF.  She was given 1 unit PRBC with improvement to >7.    Subjective/24hr Events (06/18/21):    Patient denies any more vaginal bleeding today she stated that she is doing well overall.  Denies any pain.  No fever, chills, SOB, chest pain, abdominal pain, nausea, vomiting.    Assessment & Plan:     Menorrhagia, resolved  Ultrasound showed slight heterogeneity of endometrium and borderline thickening. TSH normal  Follow-up with OB/GYN at [facility] as outpatient for endometrial sampling and pap smear. Ok to continue [facility] per Gyn. Gynecology signed off  Started Provera taper 22m TID x 3 days (completed 7/15), then 7/16 will start 165mBID x 3 days, then continue daily until [facility]     Chest pain/?Sickle cell crisis  EKG with SR. CXR neg. oncology has evaluated and thinks that she most likely was not in pain crisis and CT scanning confirms her history of multiple vascular issues which are chronic and likely the source of her discomfort in her right neck and upper  chest.  Continue home medication Hydrea and resume Folic acid  Okay to be discharged from oncology standpoint to follow-up with Dr. [name]  D/c IVF    Community-acquired pneumonia:  CT chest suggestive of early acute pneumonia right middle lobe  Started on amoxicillin/azithromycin to complete 5 day course on 7/14  Patient also with nasal congestion, started patient on Flonase    Right neck swelling and pain, chronic  CT neck and chest repeat here showing prevertebral soft tissue edema without peripheral enhancement to suggest focal abscess; no bony erosions to  suggest discitis; etiology of this edema is unclear.    Acute blood loss anemia  Hemoglobin 8.4 on admission, dropped to 6.1 on 7/14 and received 1U PRBC. Improved to >7.1 Likely in the setting of menorrhagia and also hemodilutional with IVF    H/O DVT/PE  On xarelto at home , says has been for 10 odd years  Heparin drip was discontinued   Xarelto resumed on 7/13 but due to drop in hemoglobin held again on 7/14  No more active bleeding, will resume Xarelto today 7/16 and monitor Hgb for another day. If continues with bleeding may consider D/c Xarelto and place IVF filter     Seizure  Keppra 1500 mg bid     Anxiety /depression  prozac 40 mg q daily    Diet:  ADULT DIET; Regular  DVT PPx: Xarelto  Code status: Full Code    Hospital Problems             Last Modified POA    * (Principal) Acute chest wall pain 06/14/2021 Yes    Sickle cell anemia with crisis ([place]) 06/15/2021 Yes     Objective:     Patient Vitals for the past 24 hrs:   Temp Pulse Resp BP SpO2   06/18/21 0736 98.1 ??F (36.7 ??C) (!) 102 16 120/79 99 %   06/18/21 0319 98.3 ??F (36.8 ??C) (!) 101 17 106/67 93 %   06/17/21 2340 98.5 ??F (36.9 ??C) 97 17 115/75 98 %   06/17/21 2033 98.4 ??F (36.9 ??C) 99 19 120/77 97 %   06/17/21 1504 98.5 ??F (36.9 ??C) (!) 117 18 130/79 98 %       Estimated body mass index is 25.76 kg/m?? as calculated from the following:    Height as of this encounter: '5\' 8"'  (1.727 m).    Weight as of this encounter: 169 lb 6.4 oz (76.8 kg).    Intake/Output Summary (Last 24 hours) at 06/18/2021 1041  Last data filed at 06/18/2021 0908  Gross per 24 hour   Intake 1776 ml   Output 1000 ml   Net 776 ml         Physical Exam:   Blood pressure 120/79, pulse (!) 102, temperature 98.1 ??F (36.7 ??C), temperature source Oral, resp. rate 16, height '5\' 8"'  (1.727 m), weight 169 lb 6.4 oz (76.8 kg), SpO2 99 %.  General:    No overt distress.  Head:  Normocephalic, atraumatic  Eyes:  Sclerae appear normal. Pupils equally round.  Exophthalmos  ENT:  Nares appear  normal, no drainage. Moist oral mucosa  Neck:  No restricted ROM. Trachea midline. Right neck soft tissue swelling, mild tender to palpate  CV:   RRR.  No m/r/g. No jugular venous distension.  Lungs:   CTAB.  No wheezing, rhonchi, or rales. Respirations even, unlabored.  Abdomen:   Bowel sounds present. Soft, nontender, nondistended.  Extremities: No cyanosis or clubbing. No  edema.  Skin:     No rashes and normal coloration. Warm and dry.    Neuro:  CN II-XII grossly intact. Sensation intact.  A&Ox3  Psych:  Normal mood and affect.      I have reviewed ordered lab tests and independently visualized imaging below:    Recent Labs:  Recent Results (from the past 48 hour(s))   Comprehensive Metabolic Panel    Collection Time: 06/17/21  7:54 AM   Result Value Ref Range    Sodium 137 136 - 145 mmol/L    Potassium 4.7 3.5 - 5.1 mmol/L    Chloride 113 (H) 98 - 107 mmol/L    CO2 21 21 - 32 mmol/L    Anion Gap 3 (L) 7 - 16 mmol/L    Glucose 104 (H) 65 - 100 mg/dL    BUN 8 6 - 23 MG/DL    CREATININE 0.80 0.6 - 1.0 MG/DL    GFR African American >60 >60 ml/min/1.32m    GFR Non-African American >60 >60 ml/min/1.755m   Calcium 8.2 (L) 8.3 - 10.4 MG/DL    Total Bilirubin 0.8 0.2 - 1.1 MG/DL    ALT 22 12 - 65 U/L    AST 67 (H) 15 - 37 U/L    Alk Phosphatase 100 50 - 136 U/L    Total Protein 7.7 6.3 - 8.2 g/dL    Albumin 3.3 (L) 3.5 - 5.0 g/dL    Globulin 4.4 (H) 2.3 - 3.5 g/dL    Albumin/Globulin Ratio 0.8 (L) 1.2 - 3.5     Magnesium    Collection Time: 06/17/21  7:54 AM   Result Value Ref Range    Magnesium 1.9 1.8 - 2.4 mg/dL   Phosphorus    Collection Time: 06/17/21  7:54 AM   Result Value Ref Range    Phosphorus 2.8 2.5 - 4.5 MG/DL   CBC with Auto Differential    Collection Time: 06/17/21  4:30 PM   Result Value Ref Range    WBC 5.3 4.3 - 11.1 K/uL    RBC 1.81 (L) 4.05 - 5.2 M/uL    Hemoglobin 7.4 (L) 11.7 - 15.4 g/dL    Hematocrit 20.6 (L) 35.8 - 46.3 %    MCV 113.8 (H) 79.6 - 97.8 FL    MCH 40.9 (H) 26.1 - 32.9 PG    MCHC  35.9 (H) 31.4 - 35.0 g/dL    RDW Cannot be calculated %    Platelets 265 150 - 450 K/uL    MPV 9.1 (L) 9.4 - 12.3 FL    nRBC 1.06 (H) 0.0 - 0.2 K/uL    Seg Neutrophils 59 43 - 78 %    Lymphocytes 23 13 - 44 %    Monocytes 15 (H) 4.0 - 12.0 %    Eosinophils % 3 0.5 - 7.8 %    Basophils 0 0.0 - 2.0 %    Immature Granulocytes 0 0.0 - 5.0 %    Segs Absolute 3.1 1.7 - 8.2 K/UL    Absolute Lymph # 1.2 0.5 - 4.6 K/UL    Absolute Mono # 0.8 0.1 - 1.3 K/UL    Absolute Eos # 0.2 0.0 - 0.8 K/UL    Basophils Absolute 0.0 0.0 - 0.2 K/UL    Absolute Immature Granulocyte 0.0 0.0 - 0.5 K/UL    RBC Comment ANISOCYTOSIS + POIKILOCYTOSIS      RBC Comment MODERATE  SICKLE CELLS        RBC  Comment OCCASIONAL  STOMATOCYTES        RBC Comment OCCASIONAL  POLYCHROMASIA        RBC Comment SLIGHT  MACROCYTOSIS        RBC Comment OCCASIONAL  TEARDROP CELLS        WBC Comment Result Confirmed By Smear      Platelet Comment ADEQUATE      Differential Type AUTOMATED     CBC    Collection Time: 06/18/21  5:51 AM   Result Value Ref Range    WBC 5.8 4.3 - 11.1 K/uL    RBC 1.84 (L) 4.05 - 5.2 M/uL    Hemoglobin 7.4 (L) 11.7 - 15.4 g/dL    Hematocrit 21.0 (L) 35.8 - 46.3 %    MCV 114.1 (H) 79.6 - 97.8 FL    MCH 40.2 (H) 26.1 - 32.9 PG    MCHC 35.2 (H) 31.4 - 35.0 g/dL    RDW 24.7 (H) 11.9 - 14.6 %    Platelets 275 150 - 450 K/uL    MPV 9.2 (L) 9.4 - 12.3 FL    nRBC 0.89 (H) 0.0 - 0.2 K/uL   Comprehensive Metabolic Panel    Collection Time: 06/18/21  5:51 AM   Result Value Ref Range    Sodium 141 136 - 145 mmol/L    Potassium 3.3 (L) 3.5 - 5.1 mmol/L    Chloride 112 (H) 98 - 107 mmol/L    CO2 23 21 - 32 mmol/L    Anion Gap 6 (L) 7 - 16 mmol/L    Glucose 134 (H) 65 - 100 mg/dL    BUN 5 (L) 6 - 23 MG/DL    CREATININE 0.80 0.6 - 1.0 MG/DL    GFR African American >60 >60 ml/min/1.79m    GFR Non-African American >60 >60 ml/min/1.776m   Calcium 8.8 8.3 - 10.4 MG/DL    Total Bilirubin 1.1 0.2 - 1.1 MG/DL    ALT 19 12 - 65 U/L    AST 26 15 - 37 U/L    Alk  Phosphatase 98 50 - 136 U/L    Total Protein 7.5 6.3 - 8.2 g/dL    Albumin 3.2 (L) 3.5 - 5.0 g/dL    Globulin 4.3 (H) 2.3 - 3.5 g/dL    Albumin/Globulin Ratio 0.7 (L) 1.2 - 3.5     Magnesium    Collection Time: 06/18/21  5:51 AM   Result Value Ref Range    Magnesium 1.9 1.8 - 2.4 mg/dL   Phosphorus    Collection Time: 06/18/21  5:51 AM   Result Value Ref Range    Phosphorus 2.7 2.5 - 4.5 MG/DL         Other Studies:  XR CHEST (2 VW)    Result Date: 06/14/2021  EXAM: XR CHEST (2 VW) HISTORY: sickle cell.  TECHNIQUE: Frontal and lateral chest. COMPARISON: 11/21/2020 FINDINGS: The cardiac silhouette, mediastinum, and pulmonary vasculature are within normal limits. There is no consolidation, pleural effusion, or pneumothorax. No significant osseous abnormalities are observed.     No evidence of an acute intrathoracic process.     [place]    Result Date: 06/14/2021  PELVIC ULTRASOUND. HISTORY:  Pelvic pain TECHNIQUE:  Transabdominal and endovaginal  images were obtained of the pelvis. FINDINGS:  - UTERUS:  7.5 cm in length.  Endometrial stripe measures 13 mm.  Endometrium is slightly heterogeneous.  No discrete myometrial or endometrial mass is seen. -  RIGHT OVARY: Never identified.  No significant right pelvic mass.  -  LEFT OVARY:  3.1 x 1.3 x 2.3 cm.  No significant adnexal mass.  Normal blood flow. No significant free fluid.     Slight heterogeneity of endometrium and borderline thickening. Consider follow-up to exclude residual tumor/hyperplasia.       Current Meds:  Current Facility-Administered Medications   Medication Dose Route Frequency    0.9 % sodium chloride infusion   IntraVENous PRN    azithromycin (ZITHROMAX) tablet 250 mg  250 mg Oral Daily    fluticasone (FLONASE) 50 MCG/ACT nasal spray 2 spray  2 spray Each Nostril Daily    amoxicillin (AMOXIL) capsule 1,000 mg  1,000 mg Oral 3 times per day    diphenhydrAMINE (BENADRYL) capsule 25 mg  25 mg Oral Q6H PRN    rivaroxaban (XARELTO) tablet 20 mg   20 mg Oral Daily with breakfast    dextrose 5 % and 0.45 % sodium chloride infusion   IntraVENous Continuous    folic acid (FOLVITE) tablet 1 mg  1 mg Oral Daily    hydroxyurea (HYDREA) chemo capsule 1,000 mg  1,000 mg Oral Daily    levETIRAcetam (KEPPRA) tablet 1,500 mg  1,500 mg Oral BID    lidocaine 4 % external patch 1 patch  1 patch TransDERmal Daily    sodium chloride flush 0.9 % injection 5-40 mL  5-40 mL IntraVENous 2 times per day    sodium chloride flush 0.9 % injection 5-40 mL  5-40 mL IntraVENous PRN    0.9 % sodium chloride infusion   IntraVENous PRN    ondansetron (ZOFRAN-ODT) disintegrating tablet 4 mg  4 mg Oral Q8H PRN    Or    ondansetron (ZOFRAN) injection 4 mg  4 mg IntraVENous Q6H PRN    polyethylene glycol (GLYCOLAX) packet 17 g  17 g Oral Daily PRN    acetaminophen (TYLENOL) tablet 650 mg  650 mg Oral Q6H PRN    Or    acetaminophen (TYLENOL) suppository 650 mg  650 mg Rectal Q6H PRN    HYDROmorphone HCl PF (DILAUDID) injection 1 mg  1 mg IntraVENous Q4H PRN    oxyCODONE-acetaminophen (PERCOCET) 5-325 MG per tablet 1 tablet  1 tablet Oral Q4H PRN    medroxyPROGESTERone (PROVERA) tablet 10 mg  10 mg Oral q8h       Signed:  Tamela Oddi, DO    Part of this note may have been written by using a voice dictation software.  The note has been proof read but may still contain some grammatical/other typographical errors.

## 2021-06-19 LAB — COMPREHENSIVE METABOLIC PANEL
ALT: 17 U/L (ref 12–65)
AST: 21 U/L (ref 15–37)
Albumin/Globulin Ratio: 0.7 — ABNORMAL LOW (ref 1.2–3.5)
Albumin: 3.3 g/dL — ABNORMAL LOW (ref 3.5–5.0)
Alk Phosphatase: 102 U/L (ref 50–136)
Anion Gap: 2 mmol/L — ABNORMAL LOW (ref 7–16)
BUN: 6 MG/DL (ref 6–23)
CO2: 26 mmol/L (ref 21–32)
Calcium: 8.8 MG/DL (ref 8.3–10.4)
Chloride: 109 mmol/L — ABNORMAL HIGH (ref 98–107)
Creatinine: 0.7 MG/DL (ref 0.6–1.0)
GFR African American: >60 mL/min/{1.73_m2} (ref >60)
GFR Non-African American: >60 mL/min/{1.73_m2} (ref >60)
Globulin: 4.5 g/dL — ABNORMAL HIGH (ref 2.3–3.5)
Glucose: 99 mg/dL (ref 65–100)
Potassium: 3.2 mmol/L — ABNORMAL LOW (ref 3.5–5.1)
Sodium: 137 mmol/L (ref 136–145)
Total Bilirubin: 1.5 MG/DL — ABNORMAL HIGH (ref 0.2–1.1)
Total Protein: 7.8 g/dL (ref 6.3–8.2)

## 2021-06-19 LAB — CBC
Hematocrit: 21.6 % — ABNORMAL LOW (ref 35.8–46.3)
Hemoglobin: 7.7 g/dL — ABNORMAL LOW (ref 11.7–15.4)
MCH: 40.5 PG — ABNORMAL HIGH (ref 26.1–32.9)
MCHC: 35.6 g/dL — ABNORMAL HIGH (ref 31.4–35.0)
MCV: 113.7 FL — ABNORMAL HIGH (ref 79.6–97.8)
MPV: 9.3 FL — ABNORMAL LOW (ref 9.4–12.3)
Platelets: 315 10*3/uL (ref 150–450)
RBC: 1.9 M/uL — ABNORMAL LOW (ref 4.05–5.2)
WBC: 5.9 10*3/uL (ref 4.3–11.1)
nRBC: 1.43 10*3/uL — ABNORMAL HIGH (ref 0.0–0.2)

## 2021-06-19 LAB — PHOSPHORUS: Phosphorus: 2.9 MG/DL (ref 2.5–4.5)

## 2021-06-19 LAB — MAGNESIUM: Magnesium: 1.9 mg/dL (ref 1.8–2.4)

## 2021-06-19 MED ORDER — POTASSIUM BICARB-CITRIC ACID 20 MEQ PO TBEF
20 MEQ | Freq: Two times a day (BID) | ORAL | Status: DC
Start: 2021-06-19 — End: 2021-06-19
  Administered 2021-06-19: 15:00:00 20 meq via ORAL

## 2021-06-19 MED ORDER — AMOXICILLIN 500 MG PO CAPS
500 MG | ORAL_CAPSULE | Freq: Three times a day (TID) | ORAL | 0 refills | Status: AC
Start: 2021-06-19 — End: 2021-06-21

## 2021-06-19 MED ORDER — LEVETIRACETAM 750 MG PO TABS
750 MG | ORAL_TABLET | Freq: Two times a day (BID) | ORAL | 1 refills | Status: AC
Start: 2021-06-19 — End: ?

## 2021-06-19 MED ORDER — POTASSIUM CHLORIDE CRYS ER 20 MEQ PO TBCR
20 MEQ | ORAL_TABLET | Freq: Every day | ORAL | 0 refills | Status: AC
Start: 2021-06-19 — End: 2021-06-24

## 2021-06-19 MED ORDER — AZITHROMYCIN 250 MG PO TABS
250 MG | ORAL_TABLET | Freq: Every day | ORAL | 0 refills | Status: AC
Start: 2021-06-19 — End: 2021-06-22

## 2021-06-19 MED ORDER — FLUTICASONE PROPIONATE 50 MCG/ACT NA SUSP
50 MCG/ACT | Freq: Every day | NASAL | 1 refills | Status: AC
Start: 2021-06-19 — End: ?

## 2021-06-19 MED ORDER — MEDROXYPROGESTERONE ACETATE 10 MG PO TABS
10 MG | ORAL_TABLET | ORAL | 0 refills | Status: DC
Start: 2021-06-19 — End: 2022-01-17

## 2021-06-19 MED FILL — FOLIC ACID 1 MG PO TABS: 1 mg | ORAL | Qty: 1

## 2021-06-19 MED FILL — MEDROXYPROGESTERONE ACETATE 10 MG PO TABS: 10 mg | ORAL | Qty: 1

## 2021-06-19 MED FILL — AZITHROMYCIN 250 MG PO TABS: 250 mg | ORAL | Qty: 1

## 2021-06-19 MED FILL — LIDOCAINE PAIN RELIEF 4 % EX PTCH: 4 % | CUTANEOUS | Qty: 1

## 2021-06-19 MED FILL — LEVETIRACETAM 500 MG PO TABS: 500 mg | ORAL | Qty: 3

## 2021-06-19 MED FILL — AMOXICILLIN 500 MG PO CAPS: 500 mg | ORAL | Qty: 2

## 2021-06-19 MED FILL — HYDROXYUREA 500 MG PO CAPS: 500 mg | ORAL | Qty: 2

## 2021-06-19 MED FILL — XARELTO 20 MG PO TABS: 20 mg | ORAL | Qty: 1

## 2021-06-19 MED FILL — OXYCODONE HCL 5 MG PO TABS: 5 mg | ORAL | Qty: 2

## 2021-06-19 MED FILL — FLUTICASONE PROPIONATE 50 MCG/ACT NA SUSP: 50 MCG/ACT | NASAL | Qty: 16

## 2021-06-19 MED FILL — EFFER-K 20 MEQ PO TBEF: 20 meq | ORAL | Qty: 1

## 2021-06-19 NOTE — Discharge Summary (Signed)
Hospitalist Discharge Summary   Admit Date:  06/14/2021  1:21 AM   DC Note date: 06/19/2021  Name:  [name] Bright   Age:  45 y.o.  Sex:  female  DOB:  07-17-1976   MRN:  [id number]   Room:  537/01  PCP:  Crosby Oyster, DO    Presenting Complaint: Vaginal Bleeding     Initial Admission Diagnosis: Dehydration [E86.0]  Vaginal bleeding [N93.9]  Sickle cell anemia with crisis ([place]) [D57.00]  Acute chest wall pain [R07.89]     Problem List for this Hospitalization (present on admission):    Principal Problem:    Acute chest wall pain  Active Problems:    Atopic rhinitis    Gastroesophageal reflux disease    Depression    Superior vena cava occlusion    History of DVT (deep vein thrombosis)    Menorrhagia    Abnormal uterine bleeding    Symptomatic anemia    Hypercoagulable state (HCC)    Seizure disorder (HCC)    Sickle cell anemia with crisis ([place])    Sickle cell disease ([place])    CAP (community acquired pneumonia)  Resolved Problems:    * No resolved hospital problems. *    Did Patient have Sepsis (YES OR NO): no    Hospital Course:  45 y.o. 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     Patient is admitted with symptomatic anemia, menorrhagia and pleuritic chest pain, ?  Sickle cell crisis.  OB/GYN was consulted.  Pelvic ultrasound was obtained and showed normal uterus size but slightly thickened endometrium that is heterogeneous.  OB/GYN Dr.Ruiz recommended for her to start Provera on 7/12 taper 10 mg 3 times daily x3 days, then twice daily for 3 days, then continue daily and to follow-up in her office at [facility].  Plans for biopsy as outpatient. She has a history of DVT in the past and was  recommended to continue Xarelto indefinitely.  OB/GYN was okay for patient to continue anticoagulation.  Xarelto was held initially due to uterine bleeding and she was started on heparin drip.     Hematology was also consulted for chest pain with concerns for possible sickle cell crisis with her history of sickle cell disease.  Her pain all appear to be chronic.  Her chronic vascular issues may be the source of her discomfort. CXR was unremarkable.  Recommended to continue Hydrea and resume folic acid.  She has chronic neck pain with CT neck and chest repeat here showing prevertebral soft tissue edema without peripheral enhancement to suggest focal abscess; no bony erosions to suggest discitis; etiology of this edema is unclear. There was a suspicion that she could have sinusitis and was started on Flonase, Zithromax and Amoxicillin to finish a 5 day course.      Her hemoglobin at baseline is 8-9.  Iron studies were unremarkable.  Heparin drip was discontinued and home Xarelto was resumed on 7/13.  Her hemoglobin dropped down to 6.1 on 7/14 most likely due to recent menorrhagia and also hemodilution with aggressive IVF.  She was given 1 unit PRBC with improvement to >7. Her home Xarelto was resumed again on 7/16 and no more  bleeding was noted. Her hgb rechecked the nex day after resuming Xarelto and improved to 7.7.     Of note, patient reported she takes Keppra 1581m BID at home so this was adjusted on med rec to reflect accordingly.  She also has mild hypokalemia and will be given several days of potassium supplements on discharge. Follow-up with PCP for repeat BMP.    Patient continues to feel well and was very eager to go home. She was discharged in hemodynamically stable condition. She will need to follow up with her PCP for BMP and CBC as well as hospital follow up. She will need to follow-up with OB/GYN Dr. RVashti Heyin 1 week for endometrial biopsy.  Follow-up with hematology/oncology Dr. KHumphrey Rollsin 1-2 weeks for  sickle cell disease.  Return precautions given extensively.    Disposition: home  Diet: ADULT DIET; Regular  Code Status: Full Code    Follow Ups:   Follow-up Information     SAlford Highland MD. Schedule an appointment as soon as possible for a visit in   1 week.    Specialty: Obstetrics & Gynecology  Why: Hosptial follow up - Abnormal bleeding, endometrial biopsy, pap smear  Contact information:  [id number] [place] Follow up in 1 week(s).    Specialty: Family Medicine  Why: menorrhagia follow up  Contact information:  [id number] [phone]             SLoney Loh MD, MD Follow up in 2 week(s).    Specialty: Hematology and Oncology  Why: Sickle cell follow up  Contact information:  [address]  [address] [address] [place] [phone]                       Time spent in patient discharge and coordination 45 minutes.        Follow up labs/diagnostics (ultimately defer to outpatient provider):  She will need to follow up with her PCP for BMP and CBC as well as hospital follow up. She will need to follow-up with OB/GYN Dr. RVashti Heyin 1 week for endometrial biopsy.  Follow-up with hematology/oncology Dr. KHumphrey Rollsin 1-2 weeks for sickle cell disease.    Plan was discussed with patient.  All questions answered.  Patient was stable at time of discharge.  Instructions given to call a physician or return if any concerns.    Current Discharge Medication List        START taking these medications    Details   amoxicillin (AMOXIL) 500 MG capsule Take 2 capsules by mouth in the morning and 2 capsules at noon and 2 capsules before bedtime. Do all this for 2 days.  Qty: 12 capsule, Refills: 0      azithromycin (ZITHROMAX) 250 MG tablet Take 1 tablet by mouth in the morning for 2 doses.  Qty: 2 tablet, Refills: 0    Associated Diagnoses: Acute maxillary sinusitis, recurrence not specified       fluticasone (FLONASE) 50 MCG/ACT nasal spray 2 sprays by Each Nostril route in the morning.  Qty: 16 g, Refills: 1      medroxyPROGESTERone (PROVERA) 10 MG tablet Take 2 tablets by mouth in the morning and at bedtime for 3 days, THEN 1 tablet daily.  Qty:  42 tablet, Refills: 0      potassium chloride (KLOR-CON M) 20 MEQ extended release tablet Take 1 tablet by mouth in the morning for 5 days.  Qty: 5 tablet, Refills: 0           CONTINUE these medications which have CHANGED    Details   levETIRAcetam (KEPPRA) 750 MG tablet Take 2 tablets by mouth in the morning and 2 tablets before bedtime.  Qty: 60 tablet, Refills: 1           CONTINUE these medications which have NOT CHANGED    Details   cyanocobalamin 100 MCG tablet Take 100 mcg by mouth daily      diphenhydrAMINE-APAP, sleep, (TYLENOL PM EXTRA STRENGTH) 25-500 MG tablet Take 1 tablet by mouth daily      FLUoxetine (PROZAC) 20 MG capsule Take 40 mg by mouth daily      folic acid (FOLVITE) 1 MG tablet Take 1 mg by mouth daily      hydroxyurea (HYDREA) 500 MG chemo capsule Take 1,000 mg by mouth daily      rivaroxaban (XARELTO) 20 MG TABS tablet Take 20 mg by mouth daily (with breakfast)           STOP taking these medications       cefdinir (OMNICEF) 300 MG capsule Comments:   Reason for Stopping:         cetirizine (ZYRTEC) 10 MG tablet Comments:   Reason for Stopping:         diphenhydrAMINE (SOMINEX) 25 MG tablet Comments:   Reason for Stopping:         lidocaine 4 % external patch Comments:   Reason for Stopping:         naproxen (EC NAPROSYN) 500 MG EC tablet Comments:   Reason for Stopping:         pantoprazole (PROTONIX) 40 MG tablet Comments:   Reason for Stopping:         predniSONE 10 MG (21) TBPK Comments:   Reason for Stopping:               Procedures done this admission:  * No surgery found *    Consults this admission:  IP CONSULT TO OB GYN  IP CONSULT TO HEMATOLOGY  FOLLOW UP VISIT  FOLLOW UP VISIT  FOLLOW UP VISIT    Echocardiogram results:  No  results found for this or any previous visit.      Diagnostic Imaging/Tests:   XR NECK SOFT TISSUE    Result Date: 06/17/2021  Marked soft tissue swelling appreciated in the vertebral space, 26 mm compared to 5 mm in April.  This measured 11 mm on the recent CT scan 2 days ago.  Clinical correlation and consideration for repeat CT scan should be considered     XR CHEST (2 VW)    Result Date: 06/14/2021  No evidence of an acute intrathoracic process.     CT SOFT TISSUE NECK W CONTRAST    Result Date: 06/17/2021  There is prevertebral soft tissue edema which measures water density from the skull base to the C5 level.  No peripheral enhancement to suggest focal abscess.  No bony erosions or disc space narrowing to suggest discitis.  Etiology of this edema is unclear.     US PELVIS COMPLETE    Result Date: 06/14/2021  Slight heterogeneity of endometrium and borderline thickening. Consider follow-up to exclude residual tumor/hyperplasia.     CT SOFT TISSUE NECK CHEST  W CONTRAST    Result Date: 06/15/2021  1. Numerous superficial collaterals seen within the anterior chest wall, as was seen on the prior exam. 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 2. Bibasilar scarring, atelectasis with similar findings noted in the lingula and right middle lobe.         Labs: Results:       BMP, Mg, Phos Recent Labs     06/17/21  0754 06/18/21  0551 06/19/21  0652   NA 137 141 137   K 4.7 3.3* 3.2*   CL 113* 112* 109*   CO2 '21 23 26   ' ANIONGAP 3* 6* 2*   BUN 8 5* 6   CREATININE 0.80 0.80 0.70   LABGLOM >60 >60 >60   GFRAA >60 >60 >60   CALCIUM 8.2* 8.8 8.8   GLUCOSE 104* 134* 99   MG 1.9 1.9 1.9   PHOS 2.8 2.7 2.9      CBC Recent Labs     06/17/21  1630 06/18/21  0551 06/19/21  0743   WBC 5.3 5.8 5.9   RBC 1.81* 1.84* 1.90*   HGB 7.4* 7.4* 7.7*   HCT 20.6* 21.0* 21.6*   MCV 113.8* 114.1* 113.7*   MCH 40.9* 40.2* 40.5*   MCHC 35.9* 35.2* 35.6*   RDW Cannot be calculated 24.7*  --    PLT 265 275 315   MPV  9.1* 9.2* 9.3*   NRBC 1.06* 0.89* 1.43*   SEGS 59  --   --    LYMPHOPCT 23  --   --    EOSRELPCT 3  --   --    MONOPCT 15*  --   --    BASOPCT 0  --   --    IMMGRAN 0  --   --    SEGSABS 3.1  --   --    LYMPHSABS 1.2  --   --    EOSABS 0.2  --   --    MONOSABS 0.8  --   --    BASOSABS 0.0  --   --    ABSIMMGRAN 0.0  --   --       LFT Recent Labs     06/17/21  0754 06/18/21  0551 06/19/21  0652   BILITOT 0.8 1.1 1.5*   ALKPHOS 100 98 102   AST 67* 26 21   ALT '22 19 17   ' PROT 7.7 7.5 7.8   LABALBU 3.3* 3.2* 3.3*   GLOB 4.4* 4.3* 4.5*      Cardiac  Lab Results   Component Value Date/Time    TROPHS 6.3 07/16/2020 05:15 AM    TROPHS 8.8 01/28/2020 11:41 PM      Coags Lab Results   Component Value Date/Time    PROTIME 18.4 06/14/2021 02:45 AM    INR 1.5 06/14/2021 02:45 AM    APTT 92.1 06/15/2021 06:31 AM    APTT 193.7 06/14/2021 11:59 PM    APTT 157.3 06/14/2021 04:15 PM      A1c No results found for: LABA1C, EAG   Lipids No results found for: CHOL, LDLCALC, LABVLDL, HDL, CHOLHDLRATIO, TRIG   Thyroid  Lab Results   Component Value Date/Time    TSHELE 2.00 06/15/2021 10:16 AM        Most Recent UA Lab Results   Component Value Date/Time    WBCUA 0-3 11/21/2020 02:10 AM    RBCUA 0-3 11/21/2020 02:10 AM  BACTERIA TRACE 11/21/2020 02:10 AM          All Labs from Last 24 Hrs:  Recent Results (from the past 24 hour(s))   Comprehensive Metabolic Panel    Collection Time: 06/19/21  6:52 AM   Result Value Ref Range    Sodium 137 136 - 145 mmol/L    Potassium 3.2 (L) 3.5 - 5.1 mmol/L    Chloride 109 (H) 98 - 107 mmol/L    CO2 26 21 - 32 mmol/L    Anion Gap 2 (L) 7 - 16 mmol/L    Glucose 99 65 - 100 mg/dL    BUN 6 6 - 23 MG/DL    CREATININE 0.70 0.6 - 1.0 MG/DL    GFR African American >60 >60 ml/min/1.87m    GFR Non-African American >60 >60 ml/min/1.762m   Calcium 8.8 8.3 - 10.4 MG/DL    Total Bilirubin 1.5 (H) 0.2 - 1.1 MG/DL    ALT 17 12 - 65 U/L    AST 21 15 - 37 U/L    Alk Phosphatase 102 50 - 136 U/L    Total Protein 7.8  6.3 - 8.2 g/dL    Albumin 3.3 (L) 3.5 - 5.0 g/dL    Globulin 4.5 (H) 2.3 - 3.5 g/dL    Albumin/Globulin Ratio 0.7 (L) 1.2 - 3.5     Magnesium    Collection Time: 06/19/21  6:52 AM   Result Value Ref Range    Magnesium 1.9 1.8 - 2.4 mg/dL   Phosphorus    Collection Time: 06/19/21  6:52 AM   Result Value Ref Range    Phosphorus 2.9 2.5 - 4.5 MG/DL   CBC    Collection Time: 06/19/21  7:43 AM   Result Value Ref Range    WBC 5.9 4.3 - 11.1 K/uL    RBC 1.90 (L) 4.05 - 5.2 M/uL    Hemoglobin 7.7 (L) 11.7 - 15.4 g/dL    Hematocrit 21.6 (L) 35.8 - 46.3 %    MCV 113.7 (H) 79.6 - 97.8 FL    MCH 40.5 (H) 26.1 - 32.9 PG    MCHC 35.6 (H) 31.4 - 35.0 g/dL    Platelets 315 150 - 450 K/uL    MPV 9.3 (L) 9.4 - 12.3 FL    nRBC 1.43 (H) 0.0 - 0.2 K/uL       Allergies   Allergen Reactions    Ceftriaxone Other (See Comments)    Fentanyl Other (See Comments)     "my doctor said I had a stroke from it"    Meperidine Other (See Comments)    Morphine Itching    Oxycodone Other (See Comments)       There is no immunization history on file for this patient.    Recent Vital Data:  Patient Vitals for the past 24 hrs:   Temp Pulse Resp BP SpO2   06/19/21 1120 98.5 ??F (36.9 ??C) 100 18 129/73 95 %   06/19/21 0754 98.4 ??F (36.9 ??C) 96 18 97/64 95 %   06/19/21 0300 98.4 ??F (36.9 ??C) (!) 101 16 100/67 97 %   06/18/21 2315 99.2 ??F (37.3 ??C) (!) 117 18 (!) 136/96 92 %   06/18/21 2000 98.6 ??F (37 ??C) 97 16 106/73 96 %   06/18/21 1444 98.4 ??F (36.9 ??C) (!) 101 18 106/71 94 %       Oxygen Therapy  SpO2: 95 %  O2 Device: None (Room air)  Estimated body mass index is 25.76 kg/m?? as calculated from the following:    Height as of this encounter: '5\' 8"'  (1.727 m).    Weight as of this encounter: 169 lb 6.4 oz (76.8 kg).    Intake/Output Summary (Last 24 hours) at 06/19/2021 1254  Last data filed at 06/19/2021 1120  Gross per 24 hour   Intake 1340 ml   Output 1600 ml   Net -260 ml         Physical Exam:  General:          No overt distress.  Head:                Normocephalic, atraumatic  Eyes:               Sclerae appear normal. Pupils equally round.  Exophthalmos  ENT:                Nares appear normal, no drainage. Moist oral mucosa  Neck:               No restricted ROM. Trachea midline. Right neck soft tissue swelling, mild tender to palpate  CV:                  RRR.  No m/r/g. No jugular venous distension.  Lungs:             CTAB.  No wheezing, rhonchi, or rales. Respirations even, unlabored.  Abdomen:        Bowel sounds present. Soft, nontender, nondistended.  Extremities:     No cyanosis or clubbing. No edema.  Skin:                No rashes and normal coloration. Warm and dry.    Neuro:             CN II-XII grossly intact. Sensation intact.  A&Ox3  Psych:             Normal mood and affect.      Signed:  Tamela Oddi, DO    Part of this note may have been written by using a voice dictation software.  The note has been proof read but may still contain some grammatical/other typographical errors.

## 2021-06-19 NOTE — Progress Notes (Signed)
Patient will be d/c home today.  Patient has no needs identified at being d/c home today.  Family will transport home.  Patient has met all treatment goals / milestones.  CM will continue to monitor and remain available for any needs that may occur.              06/14/21 1041   Service Assessment   Patient Orientation Alert and Oriented;Person;Place;Situation;Self   Cognition Alert   History Provided By Patient   Primary [place] Parent   Patient's Healthcare Decision Maker is: Legal Next of Kin   PCP Verified by CM Yes   Last Visit to PCP Within last 3 months   Prior Functional Level Independent in ADLs/IADLs   Current Functional Level Independent in ADLs/IADLs   Can patient return to prior living arrangement Yes   Ability to make needs known: Good   Family able to assist with home care needs: Yes   Would you like for me to discuss the discharge plan with any other family members/significant others, and if so, who? Yes   Social/Functional History   Type of Home Apartment   Home Layout One level   Home Access Elevator   Bathroom Shower/Tub Tub/Shower unit;Walk-in Chemical engineer Help From Family   ADL Assistance Independent   Homemaking Assistance Independent   Homemaking Responsibilities Yes   Ambulation Assistance Independent   Transfer Assistance Independent   Active Driver Yes   Mode of Transportation Car   Occupation On disability   Discharge Planning   Potential Assistance Purchasing Medications No   Patient expects to be discharged to: Apartment   One/Two Story Residence One story   History of falls? 0   Services At/After Discharge   Services At/After Discharge None   [employer] Information Provided? No   Mode of Transport at Discharge Other (see comment)   Confirm Follow Up Transport Family

## 2021-06-19 NOTE — Plan of Care (Signed)
Problem: Pain  Goal: Verbalizes/displays adequate comfort level or baseline comfort level  06/19/2021 1320 by Joesphine Bare, RN  Outcome: Adequate for Discharge  06/19/2021 2130 by Marissa Calamity, RN  Outcome: Progressing     Problem: Safety - Adult  Goal: Free from fall injury  06/19/2021 1320 by Joesphine Bare, RN  Outcome: Adequate for Discharge  06/19/2021 0623 by Marissa Calamity, RN  Outcome: Progressing  Flowsheets (Taken 06/18/2021 2000)  Free From Fall Injury: Instruct family/caregiver on patient safety     Problem: ABCDS Injury Assessment  Goal: Absence of physical injury  06/19/2021 1320 by Joesphine Bare, RN  Outcome: Adequate for Discharge  06/19/2021 [id number] by Marissa Calamity, RN  Outcome: Progressing  Flowsheets (Taken 06/18/2021 2000)  Absence of Physical Injury: Implement safety measures based on patient assessment

## 2021-06-19 NOTE — Progress Notes (Signed)
Pt discharge instructions given. All questions answered. Port/IV de-accessed. Appointments verified. Pt discharged via wheelchair.

## 2021-06-19 NOTE — Discharge Instructions (Signed)
Anemia: Care Instructions  Your Care Instructions     Anemia is a low level of red blood cells, which carry oxygen throughout your body. Many things can cause anemia. Lack of iron is one of the most common causes. Your body needs iron to make hemoglobin, a substance in red blood cells that carries oxygen from the lungs to your body's cells. Without enough iron, the body produces fewer and smaller red blood cells. As a result, your body's cells do not get enough oxygen, and you feel tired and weak. And you may havetrouble concentrating.  Bleeding is the most common cause of a lack of iron. You may have heavy menstrual bleeding or bleeding caused by conditions such as ulcers, hemorrhoids, or cancer. Regular use of aspirin or other anti-inflammatory medicines (such as ibuprofen) also can cause bleeding in some people. A lack of iron in your diet also can cause anemia, especially at times when the bodyneeds more iron, such as during pregnancy, infancy, and the teen years.  Your doctor may have prescribed iron pills. It may take several months of treatment for your iron levels to return to normal. Your doctor also maysuggest that you eat foods that are rich in iron, such as meat and beans.  There are many other causes of anemia. It is not always due to a lack of iron. Finding the specific cause of your anemia will help your doctor find the righttreatment for you.  Follow-up care is a key part of your treatment and safety. Be sure to make and go to all appointments, and call your doctor if you are having problems. It's also a good idea to know your test results and keep alist of the medicines you take.  How can you care for yourself at home?  Take your medicines exactly as prescribed. Call your doctor if you think you are having a problem with your medicine.  If your doctor recommends iron pills, take them as directed:  Try to take the pills on an empty stomach about 1 hour before or 2 hours after meals. But you may  need to take iron with food to avoid an upset stomach.  Do not take antacids or drink milk or caffeine drinks (such as coffee, tea, or cola) at the same time or within 2 hours of the time that you take your iron. They can make it hard for your body to absorb the iron.  Vitamin C (from food or supplements) helps your body absorb iron. Try taking iron pills with a glass of orange juice or some other food that is high in vitamin C, such as citrus fruits.  Iron pills may cause stomach problems, such as heartburn, nausea, diarrhea, constipation, and cramps. Be sure to drink plenty of fluids, and include fruits, vegetables, and fiber in your diet each day. Iron pills often make your bowel movements dark or green.  If you forget to take an iron pill, do not take a double dose of iron the next time you take a pill.  Keep iron pills out of the reach of small children. An overdose of iron can be very dangerous.  Follow your doctor's advice about eating iron-rich foods. These include red meat, shellfish, poultry, eggs, beans, raisins, whole-grain bread, and leafy green vegetables.  Steam vegetables to help them keep their iron content.  When should you call for help?   Call 911 anytime you think you may need emergency care. For example, call if:    You have   symptoms of a heart attack. These may include:  Chest pain or pressure, or a strange feeling in the chest.  Sweating.  Shortness of breath.  Nausea or vomiting.  Pain, pressure, or a strange feeling in the back, neck, jaw, or upper belly or in one or both shoulders or arms.  Lightheadedness or sudden weakness.  A fast or irregular heartbeat.  After you call 911, the operator may tell you to chew 1 adult-strength or 2 to 4 low-dose aspirin. Wait for an ambulance. Do not try to drive yourself.     You passed out (lost consciousness).   Call your doctor now or seek immediate medical care if:    You have new or increased shortness of breath.     You are dizzy or lightheaded, or  you feel like you may faint.     Your fatigue and weakness continue or get worse.     You have any abnormal bleeding, such as:  Nosebleeds.  Vaginal bleeding that is different (heavier, more frequent, at a different time of the month) than what you are used to.  Bloody or black stools, or rectal bleeding.  Bloody or pink urine.   Watch closely for changes in your health, and be sure to contact your doctor if:    You do not get better as expected.   Where can you learn more?  Go to [email or web address] and sign in to your MyChart account. Enter R301 in the Search Health Information box to learn more about "Anemia: Care Instructions."     If you do not have an account, please click on the "Sign Up Now" link.  Current as of: November 01, 2020               Content Version: 13.3  ?? 2006-2022 [employer].   Care instructions adapted under license by [facility]. If you have questions about a medical condition or this instruction, always ask your healthcare professional. [employer] disclaims any warranty or liability for your use of this information.

## 2021-06-21 LAB — HEMOGLOBIN FRACTIONATION, REFLEX
Hemoglobin A: 0 % — ABNORMAL LOW (ref 96.4–98.8)
Hemoglobin C: 0 %
Hemoglobin E%: 0 %
Hemoglobin F: 22.8 % — ABNORMAL HIGH (ref 0.0–2.0)
Hemoglobin S: 75.1 % — ABNORMAL HIGH
Hemoglobin Variant: 0 %
Hgb Solubility: POSITIVE — AB

## 2021-06-21 LAB — HEMOGLOBINOPATHY EVALUATION: Hemoglobin A2: 2.1 % (ref 1.8–3.2)

## 2021-07-14 ENCOUNTER — Ambulatory Visit
Admit: 2021-07-14 | Discharge: 2021-07-14 | Payer: MEDICARE | Attending: Hematology | Primary: Student in an Organized Health Care Education/Training Program

## 2021-07-14 ENCOUNTER — Inpatient Hospital Stay: Admit: 2021-07-18 | Payer: MEDICARE | Primary: Student in an Organized Health Care Education/Training Program

## 2021-07-14 ENCOUNTER — Encounter

## 2021-07-14 DIAGNOSIS — D571 Sickle-cell disease without crisis: Secondary | ICD-10-CM

## 2021-07-14 DIAGNOSIS — D5700 Hb-SS disease with crisis, unspecified: Secondary | ICD-10-CM

## 2021-07-14 LAB — COMPREHENSIVE METABOLIC PANEL
ALT: 19 U/L (ref 12–65)
AST: 16 U/L (ref 15–37)
Albumin/Globulin Ratio: 0.8 — ABNORMAL LOW (ref 1.2–3.5)
Albumin: 4 g/dL (ref 3.5–5.0)
Alk Phosphatase: 104 U/L (ref 50–136)
Anion Gap: 9 mmol/L (ref 7–16)
BUN: 6 MG/DL (ref 6–23)
CO2: 20 mmol/L — ABNORMAL LOW (ref 21–32)
Calcium: 9.4 MG/DL (ref 8.3–10.4)
Chloride: 111 mmol/L — ABNORMAL HIGH (ref 98–107)
Creatinine: 1 MG/DL (ref 0.6–1.0)
GFR African American: >60 mL/min/{1.73_m2} (ref >60)
GFR Non-African American: >60 mL/min/{1.73_m2} (ref >60)
Globulin: 4.9 g/dL — ABNORMAL HIGH (ref 2.3–3.5)
Glucose: 97 mg/dL (ref 65–100)
Potassium: 3.9 mmol/L (ref 3.5–5.1)
Sodium: 140 mmol/L (ref 136–145)
Total Bilirubin: 0.9 MG/DL (ref 0.2–1.1)
Total Protein: 8.9 g/dL — ABNORMAL HIGH (ref 6.3–8.2)

## 2021-07-14 LAB — CBC WITH AUTO DIFFERENTIAL
Absolute Eos #: 0 10*3/uL (ref 0.0–0.8)
Absolute Immature Granulocyte: 0 10*3/uL (ref 0.0–0.5)
Absolute Lymph #: 2 10*3/uL (ref 0.5–4.6)
Absolute Mono #: 0.4 10*3/uL (ref 0.1–1.3)
Basophils Absolute: 0 10*3/uL (ref 0.0–0.2)
Basophils: 0 % (ref 0.0–2.0)
Eosinophils %: 1 % (ref 0.5–7.8)
Hematocrit: 26.9 % — ABNORMAL LOW (ref 35.8–46.3)
Hemoglobin: 9.4 g/dL — ABNORMAL LOW (ref 11.7–15.4)
Immature Granulocytes: 0 % (ref 0.0–5.0)
Lymphocytes: 41 % (ref 13–44)
MCH: 41.4 PG — ABNORMAL HIGH (ref 26.1–32.9)
MCHC: 34.9 g/dL (ref 31.4–35.0)
MCV: 118.5 FL — ABNORMAL HIGH (ref 79.6–97.8)
MPV: 8.9 FL — ABNORMAL LOW (ref 9.4–12.3)
Monocytes: 7 % (ref 4.0–12.0)
Platelets: 333 10*3/uL (ref 150–450)
RBC: 2.27 M/uL — ABNORMAL LOW (ref 4.05–5.2)
RDW: 19.2 % — ABNORMAL HIGH (ref 11.9–14.6)
Seg Neutrophils: 51 % (ref 43–78)
Segs Absolute: 2.5 10*3/uL (ref 1.7–8.2)
WBC: 4.9 10*3/uL (ref 4.3–11.1)
nRBC: 0.34 10*3/uL — ABNORMAL HIGH (ref 0.0–0.2)

## 2021-07-14 LAB — MAGNESIUM: Magnesium: 2.4 mg/dL (ref 1.8–2.4)

## 2021-07-14 MED ORDER — LEVOFLOXACIN 500 MG PO TABS
500 MG | ORAL_TABLET | Freq: Every day | ORAL | 0 refills | Status: AC
Start: 2021-07-14 — End: 2021-07-24
  Filled 2021-07-14: qty 10, 10d supply, fill #0

## 2021-07-14 MED ORDER — HYDROXYUREA 500 MG PO CAPS
500 MG | ORAL_CAPSULE | Freq: Every day | ORAL | 6 refills | Status: AC
Start: 2021-07-14 — End: 2022-10-16

## 2021-07-14 MED ORDER — VITAMIN D (ERGOCALCIFEROL) 1.25 MG (50000 UT) PO CAPS
1.25 MG (50000 UT) | ORAL_CAPSULE | Freq: Every day | ORAL | 0 refills | Status: AC
Start: 2021-07-14 — End: 2021-07-28
  Filled 2021-07-14: qty 14, 14d supply, fill #0

## 2021-07-14 NOTE — Progress Notes (Signed)
[facility]  [address]  [place], [place] [address]  Phone: [phone]           07/14/2021  [name] Bright  January 27, 1976  [id number]         [name] Bright is a 45 year old African-American female who has returned to my clinic for a follow-up visit; she was initially referred to me by Dr. Delfino Lovett for management of Sickle Cell Disease; she is on Hydrea 1000 mg/day.        ALLERGIES:    Latex.        FAMILY HISTORY:    Her brother died of Sickle Cell Crisis.        SOCIAL HISTORY:    She is single and lives alone. She is on SSI. She denies ever using any tobacco products.        PAST MEDICAL HISTORY:     Migraines, Seizures, Vitamin D deficiency, right arm DVT, Pulmonary Embolism, Depression, splenic infarction, GERD, renal insufficiency, Osteoarthritis, SVC thrombosis and Sickle Cell Disease.        ROS:  The patient complained of fatigue and exertional dyspnea; all other systems negative.        PHYSICAL EXAM:   The patient was alert, awake and oriented, no acute distress was noted. Oral examination did not reveal any mucosal lesions. Lymph node examination did not reveal any adenopathy. Neck examination revealed a supple neck, no thyromegaly or masses were noted. Chest examination revealed normal vesicular breath sounds. Heart examination revealed S-1 and S-2 without any murmurs. Abdominal examination revealed a non-tender abdomen, bowel sounds were positive, no organomegaly could be appreciated. Examination of the extremities did not reveal any tenderness or erythema. Examination of the skin did not reveal any lesions.  Medical problems and test results were reviewed with the patient today.        KPS:    80.        LABORATORY INVESTIGATIONS:  CBC showed a WBC count of 4.9, ANC was 2.5, Hemoglobin was 9.4 and Platelets were 333.        ASSESSMENT:    Sickle Cell Disease without crisis; Vitamin D deficiency. I spent a total of 42 minutes on the day of the visit, managing the care of this  patient.        PLAN:    She should continue taking Xarelto 20 mg/day, Folic acid 1 mg/day,  Hydrea 1000 mg/day and supplemental Vitamin D; her Thalassemia work-up is pending; at her next clinic visit I will re-check her CBC, CMP, D-dimers and Vitamin D level.        Sharin Grave MD  Hematology/BMT

## 2021-07-14 NOTE — Patient Instructions (Addendum)
Patient Instructions from Today's Visit    Reason for Visit:  Follow up - Sickle Cell     Diagnosis Information:  https://www.cancer.net/about-us/asco-answers-patient-education-materials/asco-answers-fact-sheets    Plan:  You will need to have lab work after office visit today.  Your Vitamin D level is very low - we are going to send in prescription strength Vitamin D to the pharmacy downstairs. You will take one pill a day for two weeks.  After completing the prescription strength Vitamin D, you should start taking over the counter Vitamin D (1000 units) daily.   Continue Xarelto.  Continue Folic Acid.  Continue Hydrea.   Will send antibiotic for sinus infection - take one pill a day for 10 days.   Referral sent to pain management.   Please call us if you have any questions or concerns before your next visit.     Follow Up:  Follow up in February with Dr. Welton Flakes, with labs prior.     Recent Lab Results:  No visits with results within 3 Day(s) from this visit.   Latest known visit with results is:   No results displayed because visit has over 200 results.            Treatment Summary has been discussed and given to patient: N/A        -------------------------------------------------------------------------------------------------------------------  Please call our office at [phone] if you have any  of the following symptoms:   Fever of 100.5 or greater  Chills  Shortness of breath  Swelling or pain in one leg    After office hours an answering service is available and will contact a provider for emergencies or if you are experiencing any of the above symptoms.    Patient does express an interest in My Chart.  My Chart log in information explained on the after visit summary printout at the check-out desk.    Orbie Pyo, RN

## 2021-07-22 LAB — ALPHA THALASSEMIA

## 2021-10-14 ENCOUNTER — Emergency Department: Admit: 2021-10-14 | Payer: MEDICARE | Primary: Student in an Organized Health Care Education/Training Program

## 2021-10-14 ENCOUNTER — Inpatient Hospital Stay
Admit: 2021-10-14 | Discharge: 2021-10-14 | Disposition: A | Discharge status: Home / Self Care | Payer: MEDICARE | Attending: Emergency Medicine

## 2021-10-14 DIAGNOSIS — U071 COVID-19: Secondary | ICD-10-CM

## 2021-10-14 LAB — COVID-19, RAPID: SARS-CoV-2, Rapid: DETECTED — CR

## 2021-10-14 LAB — RAPID INFLUENZA A/B ANTIGENS
Influenza A Ag: NEGATIVE
Influenza B Ag: NEGATIVE

## 2021-10-14 MED ORDER — ACETAMINOPHEN 500 MG PO TABS
500 MG | ORAL | Status: AC
Start: 2021-10-14 — End: 2021-10-14
  Administered 2021-10-14: 22:00:00 1000 mg via ORAL

## 2021-10-14 MED ORDER — AZITHROMYCIN 250 MG PO TABS
250 MG | ORAL_TABLET | ORAL | 0 refills | Status: AC
Start: 2021-10-14 — End: 2021-10-19

## 2021-10-14 MED FILL — TYLENOL EXTRA STRENGTH 500 MG PO TABS: 500 mg | ORAL | Qty: 2

## 2021-10-14 NOTE — Discharge Summary (Signed)
I have reviewed discharge instructions with the patient.  The patient verbalized understanding.    Patient left ED via Discharge Method: ambulatory to Home with self.    Opportunity for questions and clarification provided.       Patient given 1 scripts.         To continue your aftercare when you leave the hospital, you may receive an automated call from our care team to check in on how you are doing.  This is a free service and part of our promise to provide the best care and service to meet your aftercare needs.??? If you have questions, or wish to unsubscribe from this service please call [phone].  Thank you for Choosing our [facility].

## 2021-10-14 NOTE — Discharge Instructions (Addendum)
Take medications as prescribed.  Follow-up with recommended provider in the next 1-2 days.  Return to the ED immediately for any new, worsening, concerning symptoms; or for danger signs as discussed.

## 2021-10-14 NOTE — ED Triage Notes (Signed)
Patient states of generalized body aches, generalized headache, states of productive cough with green sputum, Sore throat, and states of chills.

## 2021-10-14 NOTE — ED Provider Notes (Signed)
[facility] Emergency Department Provider Note                   PCP:                Lupita Raider, DO               Age: 45 y.o.      Sex: female     No diagnosis found.    DISPOSITION          MDM     HPI as charted. Pt neck is supple, no meningeal signs.  No rash, sore throat, nausea/vomiting or abdominal pain. No oropharyngeal edema/erythema/exudates.  No thunderclap onset of headache, no worst headache of life.  No known therapeutic measures.  Has not been seen for this complaint prior to today.  Uvula midline, no visualized PTA, no throat pain with range of motion of neck, no tender or enlarged lymph nodes, lungs are clear to auscultation, no diminished sounds.  Abdomen is soft and not tender.  Denies urinary complaint. . Low suspicion of deep space infection, RPA/PTA, strep pharyngitis, encephalitis, meningitis, otitis externa/malignant otitis externa, mastoiditis, bacteremia, CA, myocarditis, PE/DVT, ACS, COPD exacerbation, other emergent process. Symptoms likely related to viral URI, with concern given for COVID-19 infection and influenza and suspect secondary bacterial ear infection.   She reports significant allergy to penicillins, Augmentin, ceftriaxone.  Prescribe azithromycin.  Unfortunately, patient had a prolonged wait before even being triaged in ED lobby, approximately 2 hours.  After triage she had a chest x-ray with no acute process noted on this imaging, reassuring vital signs, no COVID and strep test were obtained with negative result..   Advised on therapeutic measures, given very strict return to ED for care instructions.  Strict return to ED for care instruction provided.  Advised to follow-up with PCP in 2-3 days. Return to ED immediately for any new, worsening, concerning symptoms.   Patient is very well-hydrated appearing, no distress, pleasant and conversational.  Nontoxic-appearing, tolerating oral intake. Patient is hemodynamically stable and in no acute distress. Patient is not  ill-appearing.  All findings and plans were discussed with the patient. Patient verbalizes desire to be discharged home at this time. All questions answered. Discussed with the patient that an unremarkable evaluation in the ED does not preclude the development or presence of a serious or life threatening condition. Patient was instructed to return immediately for any worsening or change in current symptoms, or if symptoms do not continue to improve. I instructed them to follow up with their primary care provider, own specialist, or medical provider that I am recommending for him within the next 2-3 days     the patient acknowledged understanding plan of care and affirmed approval. Patient is discharged home, with no further complaint.     Orders Placed This Encounter   Procedures    COVID-19, Rapid    Rapid influenza A/B antigens    XR CHEST (2 VW)        Tedra Coppernoll is a 45 y.o. female who presents to the Emergency Department with chief complaint of    Chief Complaint   Patient presents with    Cough      HPI  45 year old female presents to the ED with complaints of productive cough with green-colored expectorate x2 weeks, intermittent headaches, nasal congestion/rhinorrhea and sinus pain, left ear pain x2-3 days.  States he developed respiratory illness which included cough approximately 2 weeks ago, symptoms largely resolved but have  since worsened. Reports clear expectorate, clear rhinorrhea.  Denies fever/chills, change in appetite, weight loss, decreased oral intake, blurred vision, neck pain/stiffness, sore throat, hoarseness, drooling, difficulty swallowing, chest pain or tightness, difficulty breathing, DOE, N/V/D, abdominal pain, generalized body aches, rash, urinary complaint, change in bowel habits.  States that she has not attempted any therapeutic measures prior to arrival.      Alert & oriented x 3, afebrile, hemodynamically stable, non-toxic appearing, appears in no distress.    Medical/surgical/social history reviewed with the patient.      Review of Systems  Constitutional: Negative for fever. Negative for appetite change, chills, diaphoresis and unexpected weight change.    HENT: As in HPI     Eyes: Negative.     Respiratory: As in HPI   Cardiovascular: Negative.   GI/GU: As in HPI      Musculoskeletal: As in HPI   Skin: Negative.     Allergic/Immunologic: Negative.     Neurological: Negative.      Past Medical History:   Diagnosis Date    Seizures (HCC)     Sickle cell disease (HCC)     Stroke (HCC)         No past surgical history on file.     No family history on file.     Social History     Socioeconomic History    Marital status: Single   Tobacco Use    Smoking status: Never    Smokeless tobacco: Never   Substance and Sexual Activity    Alcohol use: Never    Drug use: Never         Latex, Ceftriaxone, Fentanyl, Influenza vaccines, Meperidine, Morphine, and Oxycodone     Previous Medications    CYANOCOBALAMIN 100 MCG TABLET    Take 100 mcg by mouth daily    CYCLOBENZAPRINE (FLEXERIL) 10 MG TABLET        DIPHENHYDRAMINE-APAP, SLEEP, (TYLENOL PM EXTRA STRENGTH) 25-500 MG TABLET    Take 1 tablet by mouth daily    FLUOXETINE (PROZAC) 20 MG CAPSULE    Take 40 mg by mouth daily    FLUTICASONE (FLONASE) 50 MCG/ACT NASAL SPRAY    2 sprays by Each Nostril route in the morning.    FOLIC ACID (FOLVITE) 1 MG TABLET    Take 1 mg by mouth daily    HYDROXYUREA (HYDREA) 500 MG CHEMO CAPSULE    Take 2 capsules by mouth in the morning.    LEVETIRACETAM (KEPPRA) 750 MG TABLET    Take 2 tablets by mouth in the morning and 2 tablets before bedtime.    MEDROXYPROGESTERONE (PROVERA) 10 MG TABLET    Take 2 tablets by mouth in the morning and at bedtime for 3 days, THEN 1 tablet daily.    POTASSIUM CHLORIDE (KLOR-CON M) 20 MEQ EXTENDED RELEASE TABLET    Take 1 tablet by mouth in the morning for 5 days.    RIVAROXABAN (XARELTO) 20 MG TABS TABLET    Take 20 mg by mouth daily (with breakfast)    VITAMIN D  (ERGOCALCIFEROL) 1.25 MG (50000 UT) CAPS CAPSULE    Take 1 capsule by mouth in the morning for 14 days.        Vitals signs and nursing note reviewed.   Patient Vitals for the past 4 hrs:   Temp Pulse Resp BP SpO2   10/14/21 1539 98.6 ??F (37 ??C) 99 20 (!) 118/92 99 %   10/14/21 1518 98.8 ??F (37.1 ??C) 88 --  129/79 98 %          Physical Exam   Constitutional: Oriented to person, place, and time. Appears well-developed and well-nourished. No distress.    HENT:    Head: Normocephalic and atraumatic. No tenderness/facial tenderness.   Right Ear: External ear normal. Non tender, no erythema/exudates.  TM intact, pearly gray, positive light reflex, no tenderness.  No mastoid tenderness.  Left Ear: External ear normal.  Non tender, no erythema/exudates. + bulging and erythema of the intact TM, with tenderness otoscope exam.  No mastoid process tenderness, no swelling, scaling of the external ear canal.  Nose: Nose normal. Pink/moist mucosa.  Clear rhinorrhea.    Mouth/Throat: Mouth normal. Uvula midline, no erythema/exudates/edema. No drooling, no voice change, no tender/enlarged nodes, no swelling. No pain with ROM of neck.   Eyes: Conjunctivae are normal. Pupils are equal, round, and reactive to light.   Neck: Supple. No tracheal deviation. Not tender, not rigid, no menningeal signs.    Cardiovascular: Normal rate, intact distal pulses. Brisk capillary refill intact, less than 2 seconds. Regular rhythm present. No murmer, no friction rub heard.   Pulmonary/Chest: Lungs are clear & equal bilaterally. No diminished sounds, no adventitious sounds. No respiratory distress.    Abdominal: Soft. Normoactive bowel sounds. There is no tenderness/distension/rigidity. No flank/CVA tenderness.     Musculoskeletal: No obvious deformity, erythema, edema.   Neurological: Alert and oriented to person, place, and time. No numbness/tingling. No loss of sensation. Positive PMS ??4. GCS= 15.    Skin: Skin is warm and dry. Capillary refill  takes less than 2 seconds. No abrasion, no lesion, no petechiae and no rash noted. Not diaphoretic. No cyanosis, erythema, or pallor.   Psychiatric: Normal mood and affect. Behavior is normal.    Nursing note and vitals reviewed.     Procedures      Labs Reviewed   COVID-19, RAPID   RAPID INFLUENZA A/B ANTIGENS        XR CHEST (2 VW)    (Results Pending)                          Voice dictation software was used during the making of this note.  This software is not perfect and grammatical and other typographical errors may be present.  This note has not been completely proofread for errors.         Lyda Kalata, APRN - CNP  10/14/21 1627

## 2021-10-28 ENCOUNTER — Inpatient Hospital Stay
Admit: 2021-10-28 | Discharge: 2021-10-29 | Disposition: A | Discharge status: Home / Self Care | Payer: MEDICARE | Attending: Emergency Medicine

## 2021-10-28 ENCOUNTER — Inpatient Hospital Stay: Admit: 2021-10-28 | Payer: MEDICARE | Primary: Student in an Organized Health Care Education/Training Program

## 2021-10-28 DIAGNOSIS — D5700 Hb-SS disease with crisis, unspecified: Principal | ICD-10-CM

## 2021-10-28 NOTE — ED Triage Notes (Signed)
Pt c/o all over body aches X1.5 weeks. Pt was COVID + on 10/14/21. Reports rib pain due to coughing.

## 2021-10-28 NOTE — ED Provider Notes (Signed)
Emergency Department Provider Note                   PCP:                Crosby Oyster, DO               Age: 45 y.o.      Sex: female       ICD-10-CM    1. Sickle cell pain crisis ([place])  D57.00 HYDROcodone-acetaminophen (NORCO) 7.5-325 MG per tablet          DISPOSITION Decision To Discharge 10/29/2021 01:29:46 AM       MDM  Number of Diagnoses or Management Options  Sickle cell pain crisis ([place]): established, worsening  Diagnosis management comments: Work-up today reveals a stable hemoglobin with elevation in her reticulocyte count  No evidence of infection noted in urine or chest x-ray  Patient improving with fluids  Did have some improvement with Dilaudid and her pain although she did develop some itchiness which was treated with Benadryl  We will go ahead and administer Norco orally    Plan to discharge home with Norco Zofran  Encouraged to increase fluids  Close follow-up with her primary care providers advised       Amount and/or Complexity of Data Reviewed  Clinical lab tests: ordered and reviewed  Tests in the radiology section of CPT??: ordered and reviewed  Tests in the medicine section of CPT??: ordered and reviewed  Decide to obtain previous medical records or to obtain history from someone other than the patient: yes  Review and summarize past medical records: yes  Independent visualization of images, tracings, or specimens: yes    Risk of Complications, Morbidity, and/or Mortality  Presenting problems: moderate  Diagnostic procedures: moderate  Management options: moderate  General comments: Elements of this note have been dictated via voice recognition software.  Text and phrases may be limited by the accuracy of the software.  The chart has been reviewed, but errors may still be present.        Patient Progress  Patient progress: improved       ED Course as of 10/29/21 0138   Fri Oct 28, 2021   2145 Procedure Note for Ultrasound Guided IV.      IV access was not able to be obtained by nursing staff  due to the patient having very difficult vein access.      Skin was cleaned and disinfected prior to IV puncture.   Ultrasound was used to find the vein which was compressible and does not have any ultrasound features of an artery.   Under real-time ultrasound guidance peripheral access was obtained in the left upper extremity.  Blood return was present and IV flushed without difficulty with no clinical signs of infiltration.  IV was taped into position and there were no immediate complications noted and patient tolerated the procedure well.   Procedure was completed by myself.   [JD]   2346 BUN,BUNPL: 8 [KH]      ED Course User Index  [JD] Franne Grip, PA  [KH] Ky Barban, MD        Orders Placed This Encounter   Procedures    XR CHEST (2 VW)    CBC with Auto Differential    CMP    Reticulocytes    Urinalysis w rflx microscopic    POCT Urine Dipstick    POCT Urinalysis no Micro    TYPE AND SCREEN  Medications   0.9 % sodium chloride bolus (1,000 mLs IntraVENous New Bag 10/28/21 2224)   HYDROmorphone HCl PF (DILAUDID) injection 0.5 mg (0.5 mg IntraVENous Given 10/28/21 2222)   promethazine (PHENERGAN) tablet 25 mg (25 mg Oral Given 10/28/21 2222)   HYDROmorphone HCl PF (DILAUDID) injection 1 mg (1 mg IntraVENous Given 10/28/21 2346)   pantoprazole (PROTONIX) 40 mg in sodium chloride (PF) 0.9 % 10 mL injection (40 mg IntraVENous Given 10/28/21 2346)   diphenhydrAMINE (BENADRYL) injection 25 mg (25 mg IntraVENous Given 10/29/21 0035)   diphenhydrAMINE (BENADRYL) injection 25 mg (25 mg IntraVENous Given 10/29/21 0118)   HYDROcodone-acetaminophen (NORCO) 10-325 MG per tablet 1 tablet (1 tablet Oral Given 10/29/21 0118)       New Prescriptions    HYDROCODONE-ACETAMINOPHEN (NORCO) 7.5-325 MG PER TABLET    Take 1 tablet by mouth every 6 hours as needed for Pain for up to 5 days. Intended supply: 5 days. Take lowest dose possible to manage pain    ONDANSETRON (ZOFRAN ODT) 8 MG TBDP DISINTEGRATING TABLET    Place  0.5-1 tablets under the tongue every 6-8 hours as needed for Nausea or Vomiting        Senora Lacson is a 45 y.o. female who presents to the Emergency Department with chief complaint of    Chief Complaint   Patient presents with    Generalized Body Aches      45 year old female patient with a history of SS sickle disease presents with complaints of pain  Fever or vomiting  No ill contacts  Over-the-counter medications ineffective    The history is provided by the patient.   Sickle Cell Pain Crisis  Pain location: Generalized body aches.  Severity:  Severe  Onset quality:  Gradual  Duration:  2 days  Similar to previous crisis episodes: yes    Timing:  Constant  Progression:  Worsening  Chronicity:  Recurrent  Sickle cell genotype:  SS  History of pulmonary emboli: no    Context: not change in medication and not non-compliance    Relieved by:  Nothing  Worsened by:  Activity and movement  Ineffective treatments:  Folic acid, OTC medications, rest and fluids  Associated symptoms: fatigue    Associated symptoms: no chest pain, no congestion, no cough, no fever, no leg ulcers, no nausea, no shortness of breath, no sore throat, no vision change, no vomiting and no wheezing      All other systems reviewed and are negative unless otherwise stated in the history of present illness section.    Review of Systems   Constitutional:  Positive for fatigue. Negative for chills and fever.   HENT:  Negative for congestion, ear pain, hearing loss, sinus pain, sneezing, sore throat and trouble swallowing.    Eyes:  Negative for pain, redness and itching.   Respiratory:  Negative for cough, shortness of breath and wheezing.    Cardiovascular:  Negative for chest pain and palpitations.   Gastrointestinal:  Negative for abdominal pain, constipation, diarrhea, nausea and vomiting.   Endocrine: Negative for polydipsia, polyphagia and polyuria.   Genitourinary:  Negative for dysuria, flank pain, frequency and hematuria.    Musculoskeletal:  Negative for arthralgias, back pain and myalgias.   Skin:  Negative for rash and wound.   Allergic/Immunologic: Negative for food allergies and immunocompromised state.   Neurological:  Negative for dizziness, syncope, speech difficulty and light-headedness.   Hematological:  Negative for adenopathy. Does not bruise/bleed easily.   Psychiatric/Behavioral:  Negative  for dysphoric mood and self-injury. The patient is not nervous/anxious.    All other systems reviewed and are negative.    Past Medical History:   Diagnosis Date    Seizures ([place])     Sickle cell disease ([place])     Stroke ([place])         No past surgical history on file.     No family history on file.     Social History     Socioeconomic History    Marital status: Single   Tobacco Use    Smoking status: Never    Smokeless tobacco: Never   Substance and Sexual Activity    Alcohol use: Never    Drug use: Never        Allergies: Latex, Ceftriaxone, Fentanyl, Influenza vaccines, Meperidine, Morphine, and Oxycodone    Previous Medications    CYANOCOBALAMIN 100 MCG TABLET    Take 100 mcg by mouth daily    CYCLOBENZAPRINE (FLEXERIL) 10 MG TABLET        DIPHENHYDRAMINE-APAP, SLEEP, (TYLENOL PM EXTRA STRENGTH) 25-500 MG TABLET    Take 1 tablet by mouth daily    FLUOXETINE (PROZAC) 20 MG CAPSULE    Take 40 mg by mouth daily    FLUTICASONE (FLONASE) 50 MCG/ACT NASAL SPRAY    2 sprays by Each Nostril route in the morning.    FOLIC ACID (FOLVITE) 1 MG TABLET    Take 1 mg by mouth daily    HYDROXYUREA (HYDREA) 500 MG CHEMO CAPSULE    Take 2 capsules by mouth in the morning.    LEVETIRACETAM (KEPPRA) 750 MG TABLET    Take 2 tablets by mouth in the morning and 2 tablets before bedtime.    MEDROXYPROGESTERONE (PROVERA) 10 MG TABLET    Take 2 tablets by mouth in the morning and at bedtime for 3 days, THEN 1 tablet daily.    POTASSIUM CHLORIDE (KLOR-CON M) 20 MEQ EXTENDED RELEASE TABLET    Take 1 tablet by mouth in the morning for 5 days.    RIVAROXABAN  (XARELTO) 20 MG TABS TABLET    Take 20 mg by mouth daily (with breakfast)    VITAMIN D (ERGOCALCIFEROL) 1.25 MG (50000 UT) CAPS CAPSULE    Take 1 capsule by mouth in the morning for 14 days.        Vitals signs and nursing note reviewed.   Patient Vitals for the past 4 hrs:   Pulse Resp BP SpO2   10/29/21 0118 -- 17 -- --   10/29/21 0101 89 -- 110/75 94 %   10/29/21 0047 -- -- -- 94 %   10/29/21 0046 -- -- 112/78 --   10/29/21 0031 -- -- -- 94 %   10/29/21 0030 -- -- 118/85 --   10/29/21 0016 -- -- -- 96 %   10/29/21 0015 -- -- 107/72 --   10/29/21 0000 -- -- 104/71 91 %   10/28/21 2346 -- 16 -- --   10/28/21 2345 -- -- 107/66 --   10/28/21 2344 -- -- -- 96 %   10/28/21 2331 -- -- -- 96 %   10/28/21 2330 -- -- 115/69 --   10/28/21 2316 -- -- -- 96 %   10/28/21 2315 -- -- 108/76 --   10/28/21 2300 79 -- 108/74 97 %   10/28/21 2246 -- -- 113/77 95 %   10/28/21 2230 -- -- 108/80 99 %   10/28/21 2222 -- 18 -- --   10/28/21 2219 -- -- --  97 %   10/28/21 2216 -- -- 121/79 --          Physical Exam  Vitals and nursing note reviewed.   Constitutional:       General: She is in acute distress.      Appearance: Normal appearance. She is normal weight.   HENT:      Head: Normocephalic and atraumatic.      Nose: Nose normal.      Mouth/Throat:      Mouth: Mucous membranes are moist.      Pharynx: Oropharynx is clear.   Eyes:      General: No scleral icterus.     Extraocular Movements: Extraocular movements intact.      Conjunctiva/sclera: Conjunctivae normal.      Pupils: Pupils are equal, round, and reactive to light.   Cardiovascular:      Rate and Rhythm: Normal rate and regular rhythm.      Pulses: Normal pulses.      Heart sounds: Normal heart sounds.   Pulmonary:      Effort: Pulmonary effort is normal. No respiratory distress.      Breath sounds: Normal breath sounds.   Abdominal:      General: Abdomen is flat. Bowel sounds are normal. There is no distension.      Palpations: Abdomen is soft. There is no mass.       Tenderness: There is no abdominal tenderness.   Musculoskeletal:         General: No deformity or signs of injury. Normal range of motion.      Cervical back: Normal range of motion and neck supple.   Skin:     General: Skin is warm and dry.      Capillary Refill: Capillary refill takes less than 2 seconds.   Neurological:      General: No focal deficit present.      Mental Status: She is alert and oriented to person, place, and time.   Psychiatric:         Mood and Affect: Mood normal.         Behavior: Behavior normal.         Thought Content: Thought content normal.         Judgment: Judgment normal.        Procedures    ED EKG Interpretation  EKG was interpreted in the absence of a cardiologist.        Results for orders placed or performed during the hospital encounter of 10/28/21   XR CHEST (2 VW)    Narrative    PA LATERAL CHEST  10/28/2021 6:27 PM     HISTORY:  cough  ;    COMPARISON: October 14, 2021    FINDINGS:  The heart size is within normal limits.  There is no lobar  consolidation, pleural effusions or pulmonary edema.      Impression    No consolidation.     CBC with Auto Differential   Result Value Ref Range    WBC 5.6 4.3 - 11.1 K/uL    RBC 2.14 (L) 4.05 - 5.2 M/uL    Hemoglobin 9.2 (L) 11.7 - 15.4 g/dL    Hematocrit 26.2 (L) 35.8 - 46.3 %    MCV 122.4 (H) 82 - 102 FL    MCH 43.0 (H) 26.1 - 32.9 PG    MCHC 35.1 (H) 31.4 - 35.0 g/dL    RDW 17.2 (H) 11.9 - 14.6 %  Platelets 479 (H) 150 - 450 K/uL    MPV 9.0 (L) 9.4 - 12.3 FL    nRBC 0.93 (H) 0.0 - 0.2 K/uL    Differential Type AUTOMATED      Seg Neutrophils 48 43 - 78 %    Lymphocytes 40 13 - 44 %    Monocytes 11 4.0 - 12.0 %    Eosinophils % 0 (L) 0.5 - 7.8 %    Basophils 0 0.0 - 2.0 %    Immature Granulocytes 0 0.0 - 5.0 %    Segs Absolute 2.7 1.7 - 8.2 K/UL    Absolute Lymph # 2.3 0.5 - 4.6 K/UL    Absolute Mono # 0.6 0.1 - 1.3 K/UL    Absolute Eos # 0.0 0.0 - 0.8 K/UL    Basophils Absolute 0.0 0.0 - 0.2 K/UL    Absolute Immature Granulocyte  0.0 0.0 - 0.5 K/UL   CMP   Result Value Ref Range    Sodium 139 133 - 143 mmol/L    Potassium 3.6 3.5 - 5.1 mmol/L    Chloride 110 101 - 110 mmol/L    CO2 24 21 - 32 mmol/L    Anion Gap 5 2 - 11 mmol/L    Glucose 98 65 - 100 mg/dL    BUN 8 6 - 23 MG/DL    Creatinine 1.10 (H) 0.6 - 1.0 MG/DL    Est, Glom Filt Rate >60 >60 ml/min/1.105m    Calcium 9.5 8.3 - 10.4 MG/DL    Total Bilirubin 1.1 0.2 - 1.1 MG/DL    ALT 20 12 - 65 U/L    AST 19 15 - 37 U/L    Alk Phosphatase 110 50 - 136 U/L    Total Protein 8.8 (H) 6.3 - 8.2 g/dL    Albumin 4.3 3.5 - 5.0 g/dL    Globulin 4.5 2.8 - 4.5 g/dL    Albumin/Globulin Ratio 1.0 0.4 - 1.6     Reticulocytes   Result Value Ref Range    Reticulocyte Count,Automated 8.1 (H) 0.3 - 2.0 %    Absolute Retic # 0.1738 (H) 0.026 - 0.095 M/ul    Immature Retic Fraction 29.7 (H) 3.0 - 15.9 %    Retic Hemoglobin conc. 40 (H) 29 - 35 pg   Urinalysis w rflx microscopic   Result Value Ref Range    Color, UA YELLOW/STRAW      Appearance CLEAR      Specific Gravity, UA 1.019 1.001 - 1.023      pH, Urine 5.5 5.0 - 9.0      Protein, UA Negative NEG mg/dL    Glucose, UA Negative mg/dL    Ketones, Urine Negative NEG mg/dL    Bilirubin Urine Negative NEG      Blood, Urine Negative NEG      Urobilinogen, Urine 1.0 0.2 - 1.0 EU/dL    Nitrite, Urine Negative NEG      Leukocyte Esterase, Urine SMALL (A) NEG      WBC, UA 0-3 0 /hpf    RBC, UA 0-3 0 /hpf    Epithelial Cells UA 0-3 0 /hpf    BACTERIA, URINE 0 0 /hpf    Other observations RESULTS VERIFIED MANUALLY     POCT Urinalysis no Micro   Result Value Ref Range    Specific Gravity, Urine, POC 1.020 1.001 - 1.023      pH, Urine, POC 5.5 5.0 - 9.0      Protein, Urine,  POC Negative NEG mg/dL    Glucose, UA POC Negative NEG mg/dL    Ketones, Urine, POC Negative NEG mg/dL    Bilirubin, Urine, POC Negative NEG      Blood, UA POC Negative NEG      URINE UROBILINOGEN POC 0.2 0.2 - 1.0 EU/dL    Nitrate, Urine, POC Negative NEG      Leukocyte Est, UA POC TRACE (A) NEG       Performed by: Lawernce Ion    TYPE AND SCREEN   Result Value Ref Range    Crossmatch expiration date 10/31/2021,2359     ABO/Rh O POSITIVE     Antibody Screen NEG         XR CHEST (2 VW)   Final Result   No consolidation.                               Voice dictation software was used during the making of this note.  This software is not perfect and grammatical and other typographical errors may be present.  This note has not been completely proofread for errors.     Ky Barban, MD  10/29/21 [phone]

## 2021-10-28 NOTE — ED Provider Notes (Signed)
Emergency Department Provider Note                   PCP:                Lupita Raider, DO               Age: 45 y.o.      Sex: female       ICD-10-CM    1. Sickle cell pain crisis ([facility])  D57.00           DISPOSITION          MDM  Number of Diagnoses or Management Options  Sickle cell pain crisis [facility])  Diagnosis management comments: Patient is 45 year old female history of sickle cell anemia who presents with extremity pain and back pain consistent with prior crises.  She does not take opiates at home and reports compliance with all of her chronic medications.  She is a very difficult stick.  I placed an ultrasound-guided line in her left upper extremity.  At this point in time all of her labs are pending.  Dr. Margie Ege will follow-up on those results and improvement of pain and disposition accordingly.       Amount and/or Complexity of Data Reviewed  Discuss the patient with other providers: yes (Dr. Margie Ege)    Risk of Complications, Morbidity, and/or Mortality  Presenting problems: moderate  Diagnostic procedures: moderate  Management options: moderate         ED Course as of 10/28/21 2215   Fri Oct 28, 2021   2145 Procedure Note for Ultrasound Guided IV.      IV access was not able to be obtained by nursing staff due to the patient having very difficult vein access.      Skin was cleaned and disinfected prior to IV puncture.   Ultrasound was used to find the vein which was compressible and does not have any ultrasound features of an artery.   Under real-time ultrasound guidance peripheral access was obtained in the left upper extremity.  Blood return was present and IV flushed without difficulty with no clinical signs of infiltration.  IV was taped into position and there were no immediate complications noted and patient tolerated the procedure well.   Procedure was completed by myself.   [JD]      ED Course User Index  [JD] Cristela Blue, PA        Orders Placed This Encounter   Procedures    XR CHEST (2 VW)     CBC with Auto Differential    CMP    Reticulocytes    POCT Urine Dipstick    TYPE AND SCREEN        Medications   0.9 % sodium chloride bolus (has no administration in time range)   HYDROmorphone HCl PF (DILAUDID) injection 0.5 mg (has no administration in time range)   promethazine (PHENERGAN) tablet 25 mg (has no administration in time range)       New Prescriptions    No medications on file        [name] Bright is a 45 y.o. 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  She was diagnosed with COVID 2 weeks ago.  She comes  in with pain in her back and extremities.  Aching everywhere.  Says she feels like it is a sickle cell crisis.  She was admitted back in July for the same.  She says this is typically where she is hurting when she is in crisis.  No chest pain or shortness of breath.  No abdominal pain.  Decreased appetite.  No urinary complaints.  Does not take any pain meds at home.  Reports compliance with all of her prescribed medications.  She tells me she had a blood transfusion earlier this year in July.  She says her baseline hemoglobin is around 7-8.  She says her doctor was worried that COVID would cause her to have a pain crisis.  She takes no narcotics at home.        Review of Systems   Constitutional:  Positive for fatigue.   HENT: Negative.     Respiratory: Negative.     Cardiovascular: Negative.    Gastrointestinal: Negative.    Genitourinary: Negative.    Musculoskeletal:  Positive for arthralgias, back pain and myalgias.   All other systems reviewed and are negative.    Past Medical History:   Diagnosis Date    Seizures ([facility])     Sickle cell disease ([facility])     Stroke ([facility])         No past surgical history on file.     No family history on file.     Social History     Socioeconomic  History    Marital status: Single   Tobacco Use    Smoking status: Never    Smokeless tobacco: Never   Substance and Sexual Activity    Alcohol use: Never    Drug use: Never         Latex, Ceftriaxone, Fentanyl, Influenza vaccines, Meperidine, Morphine, and Oxycodone     Previous Medications    CYANOCOBALAMIN 100 MCG TABLET    Take 100 mcg by mouth daily    CYCLOBENZAPRINE (FLEXERIL) 10 MG TABLET        DIPHENHYDRAMINE-APAP, SLEEP, (TYLENOL PM EXTRA STRENGTH) 25-500 MG TABLET    Take 1 tablet by mouth daily    FLUOXETINE (PROZAC) 20 MG CAPSULE    Take 40 mg by mouth daily    FLUTICASONE (FLONASE) 50 MCG/ACT NASAL SPRAY    2 sprays by Each Nostril route in the morning.    FOLIC ACID (FOLVITE) 1 MG TABLET    Take 1 mg by mouth daily    HYDROXYUREA (HYDREA) 500 MG CHEMO CAPSULE    Take 2 capsules by mouth in the morning.    LEVETIRACETAM (KEPPRA) 750 MG TABLET    Take 2 tablets by mouth in the morning and 2 tablets before bedtime.    MEDROXYPROGESTERONE (PROVERA) 10 MG TABLET    Take 2 tablets by mouth in the morning and at bedtime for 3 days, THEN 1 tablet daily.    POTASSIUM CHLORIDE (KLOR-CON M) 20 MEQ EXTENDED RELEASE TABLET    Take 1 tablet by mouth in the morning for 5 days.    RIVAROXABAN (XARELTO) 20 MG TABS TABLET    Take 20 mg by mouth daily (with breakfast)    VITAMIN D (ERGOCALCIFEROL) 1.25 MG (50000 UT) CAPS CAPSULE    Take 1 capsule by mouth in the morning for 14 days.        Vitals signs and nursing note reviewed.   No data found.         Physical  Exam  Vitals and nursing note reviewed.   Constitutional:       General: She is not in acute distress.     Appearance: She is well-developed and normal weight. She is not ill-appearing or toxic-appearing.   HENT:      Head: Normocephalic.   Eyes:      Extraocular Movements: Extraocular movements intact.   Cardiovascular:      Rate and Rhythm: Normal rate and regular rhythm.      Pulses: Normal pulses.      Heart sounds: Normal heart sounds.   Pulmonary:       Effort: Pulmonary effort is normal.      Breath sounds: Normal breath sounds.   Abdominal:      General: Bowel sounds are normal.      Palpations: Abdomen is soft.      Tenderness: There is no abdominal tenderness. There is no guarding or rebound.   Musculoskeletal:         General: Normal range of motion.      Cervical back: Normal range of motion and neck supple.   Lymphadenopathy:      Cervical: No cervical adenopathy.   Skin:     General: Skin is warm and dry.      Findings: No rash.   Neurological:      General: No focal deficit present.      Mental Status: She is alert. Mental status is at baseline.   Psychiatric:         Mood and Affect: Mood normal.         Behavior: Behavior normal.         Thought Content: Thought content normal.        Procedures    Results for orders placed or performed during the hospital encounter of 10/28/21   XR CHEST (2 VW)    Narrative    PA LATERAL CHEST  10/28/2021 6:27 PM     HISTORY:  cough  ;    COMPARISON: October 14, 2021    FINDINGS:  The heart size is within normal limits.  There is no lobar  consolidation, pleural effusions or pulmonary edema.      Impression    No consolidation.          XR CHEST (2 VW)   Final Result   No consolidation.                             Voice dictation software was used during the making of this note.  This software is not perfect and grammatical and other typographical errors may be present.  This note has not been completely proofread for errors.        Cristela Blue, [place]  10/28/21 2215

## 2021-10-29 LAB — TYPE AND SCREEN
ABO/Rh: O POS
Antibody Screen: NEGATIVE

## 2021-10-29 LAB — POCT URINALYSIS DIPSTICK
Bilirubin, Urine, POC: NEGATIVE
Blood, UA POC: NEGATIVE
Glucose, UA POC: NEGATIVE mg/dL
Ketones, Urine, POC: NEGATIVE mg/dL
Nitrite, Urine, POC: NEGATIVE
Protein, Urine, POC: NEGATIVE mg/dL
Specific Gravity, Urine, POC: 1.02 (ref 1.001–1.023)
URINE UROBILINOGEN POC: 0.2 EU/dL (ref 0.2–1.0)
pH, Urine, POC: 5.5 (ref 5.0–9.0)

## 2021-10-29 LAB — URINALYSIS
BACTERIA, URINE: 0 /hpf
Bilirubin Urine: NEGATIVE
Blood, Urine: NEGATIVE
Glucose, UA: NEGATIVE mg/dL
Ketones, Urine: NEGATIVE mg/dL
Nitrite, Urine: NEGATIVE
Protein, UA: NEGATIVE mg/dL
Specific Gravity, UA: 1.019 (ref 1.001–1.023)
Urobilinogen, Urine: 1 EU/dL (ref 0.2–1.0)
pH, Urine: 5.5 (ref 5.0–9.0)

## 2021-10-29 LAB — CBC WITH AUTO DIFFERENTIAL
Absolute Eos #: 0 10*3/uL (ref 0.0–0.8)
Absolute Immature Granulocyte: 0 10*3/uL (ref 0.0–0.5)
Absolute Lymph #: 2.3 10*3/uL (ref 0.5–4.6)
Absolute Mono #: 0.6 10*3/uL (ref 0.1–1.3)
Basophils Absolute: 0 10*3/uL (ref 0.0–0.2)
Basophils: 0 % (ref 0.0–2.0)
Eosinophils %: 0 % — ABNORMAL LOW (ref 0.5–7.8)
Hematocrit: 26.2 % — ABNORMAL LOW (ref 35.8–46.3)
Hemoglobin: 9.2 g/dL — ABNORMAL LOW (ref 11.7–15.4)
Immature Granulocytes: 0 % (ref 0.0–5.0)
Lymphocytes: 40 % (ref 13–44)
MCH: 43 PG — ABNORMAL HIGH (ref 26.1–32.9)
MCHC: 35.1 g/dL — ABNORMAL HIGH (ref 31.4–35.0)
MCV: 122.4 FL — ABNORMAL HIGH (ref 82–102)
MPV: 9 FL — ABNORMAL LOW (ref 9.4–12.3)
Monocytes: 11 % (ref 4.0–12.0)
Platelets: 479 10*3/uL — ABNORMAL HIGH (ref 150–450)
RBC: 2.14 M/uL — ABNORMAL LOW (ref 4.05–5.2)
RDW: 17.2 % — ABNORMAL HIGH (ref 11.9–14.6)
Seg Neutrophils: 48 % (ref 43–78)
Segs Absolute: 2.7 10*3/uL (ref 1.7–8.2)
WBC: 5.6 10*3/uL (ref 4.3–11.1)
nRBC: 0.93 10*3/uL — ABNORMAL HIGH (ref 0.0–0.2)

## 2021-10-29 LAB — COMPREHENSIVE METABOLIC PANEL
ALT: 20 U/L (ref 12–65)
AST: 19 U/L (ref 15–37)
Albumin/Globulin Ratio: 1 (ref 0.4–1.6)
Albumin: 4.3 g/dL (ref 3.5–5.0)
Alk Phosphatase: 110 U/L (ref 50–136)
Anion Gap: 5 mmol/L (ref 2–11)
BUN: 8 MG/DL (ref 6–23)
CO2: 24 mmol/L (ref 21–32)
Calcium: 9.5 MG/DL (ref 8.3–10.4)
Chloride: 110 mmol/L (ref 101–110)
Creatinine: 1.1 MG/DL — ABNORMAL HIGH (ref 0.6–1.0)
Est, Glom Filt Rate: >60 mL/min/{1.73_m2} (ref >60)
Globulin: 4.5 g/dL (ref 2.8–4.5)
Glucose: 98 mg/dL (ref 65–100)
Potassium: 3.6 mmol/L (ref 3.5–5.1)
Sodium: 139 mmol/L (ref 133–143)
Total Bilirubin: 1.1 MG/DL (ref 0.2–1.1)
Total Protein: 8.8 g/dL — ABNORMAL HIGH (ref 6.3–8.2)

## 2021-10-29 LAB — RETICULOCYTES
Absolute Retic #: 0.1738 M/ul — ABNORMAL HIGH (ref 0.026–0.095)
Immature Retic Fraction: 29.7 % — ABNORMAL HIGH (ref 3.0–15.9)
Retic Hemoglobin conc.: 40 pg — ABNORMAL HIGH (ref 29–35)
Reticulocyte Count,Automated: 8.1 % — ABNORMAL HIGH (ref 0.3–2.0)

## 2021-10-29 MED ORDER — HYDROMORPHONE HCL PF 1 MG/ML IJ SOLN
1 MG/ML | INTRAMUSCULAR | Status: AC
Start: 2021-10-29 — End: 2021-10-28
  Administered 2021-10-29: 05:00:00 1 mg via INTRAVENOUS

## 2021-10-29 MED ORDER — HYDROMORPHONE HCL PF 1 MG/ML IJ SOLN
1 MG/ML | Freq: Once | INTRAMUSCULAR | Status: AC
Start: 2021-10-29 — End: 2021-10-28
  Administered 2021-10-29: 03:00:00 0.5 mg via INTRAVENOUS

## 2021-10-29 MED ORDER — HYDROCODONE-ACETAMINOPHEN 10-325 MG PO TABS
10-325 MG | ORAL | Status: AC
Start: 2021-10-29 — End: 2021-10-29
  Administered 2021-10-29: 06:00:00 1 via ORAL

## 2021-10-29 MED ORDER — ONDANSETRON 8 MG PO TBDP
8 MG | ORAL_TABLET | ORAL | 0 refills | Status: DC | PRN
Start: 2021-10-29 — End: 2021-10-29

## 2021-10-29 MED ORDER — ONDANSETRON 8 MG PO TBDP
8 MG | ORAL_TABLET | ORAL | 0 refills | Status: AC | PRN
Start: 2021-10-29 — End: ?

## 2021-10-29 MED ORDER — DIPHENHYDRAMINE HCL 50 MG/ML IJ SOLN
50 MG/ML | INTRAMUSCULAR | Status: AC
Start: 2021-10-29 — End: 2021-10-29
  Administered 2021-10-29: 06:00:00 25 mg via INTRAVENOUS

## 2021-10-29 MED ORDER — PROMETHAZINE HCL 25 MG PO TABS
25 MG | ORAL | Status: AC
Start: 2021-10-29 — End: 2021-10-28
  Administered 2021-10-29: 03:00:00 25 mg via ORAL

## 2021-10-29 MED ORDER — SODIUM CHLORIDE 0.9 % IV BOLUS
0.9 | Freq: Once | INTRAVENOUS | Status: AC
Start: 2021-10-29 — End: 2021-10-29
  Administered 2021-10-29: 03:00:00 1000 mL via INTRAVENOUS

## 2021-10-29 MED ORDER — PANTOPRAZOLE SODIUM 40 MG IV SOLR
40 MG | Freq: Once | INTRAVENOUS | Status: AC
Start: 2021-10-29 — End: 2021-10-28
  Administered 2021-10-29: 05:00:00 40 mg via INTRAVENOUS

## 2021-10-29 MED ORDER — HYDROCODONE-ACETAMINOPHEN 7.5-325 MG PO TABS
ORAL_TABLET | Freq: Four times a day (QID) | ORAL | 0 refills | Status: AC | PRN
Start: 2021-10-29 — End: 2021-11-03

## 2021-10-29 MED FILL — DIPHENHYDRAMINE HCL 50 MG/ML IJ SOLN: 50 MG/ML | INTRAMUSCULAR | Qty: 1

## 2021-10-29 MED FILL — PROMETHAZINE HCL 25 MG PO TABS: 25 mg | ORAL | Qty: 1

## 2021-10-29 MED FILL — PANTOPRAZOLE SODIUM 40 MG IV SOLR: 40 mg | INTRAVENOUS | Qty: 40

## 2021-10-29 MED FILL — HYDROMORPHONE HCL 1 MG/ML IJ SOLN: 1 mg/mL | INTRAMUSCULAR | Qty: 1

## 2021-10-29 MED FILL — HYDROCODONE-ACETAMINOPHEN 10-325 MG PO TABS: 10-325 MG | ORAL | Qty: 1

## 2021-10-29 NOTE — Discharge Instructions (Signed)
Increase your fluid intake  Take Benadryl 25 to 50 mg every 8 hours as needed for itching  Use pain medications as needed    Do not drink alcohol or drive while taking the prescription pain medications    Call your doctor or the follow up doctor to set up appointment for recheck visit    Return to ER for any worsening symptoms or new problems which may arise

## 2021-10-29 NOTE — ED Notes (Signed)
I have reviewed discharge instructions with the patient.  The patient verbalized understanding.    Patient left ED via Discharge Method: ambulatory to Home with family.    Opportunity for questions and clarification provided.       Patient given 2 scripts.         To continue your aftercare when you leave the hospital, you may receive an automated call from our care team to check in on how you are doing.  This is a free service and part of our promise to provide the best care and service to meet your aftercare needs.??? If you have questions, or wish to unsubscribe from this service please call [phone].  Thank you for Choosing our [facility] Emergency Department.      Kirby Crigler, RN  10/29/21 [phone]

## 2021-12-29 ENCOUNTER — Emergency Department: Admit: 2021-12-29 | Payer: MEDICARE | Primary: Student in an Organized Health Care Education/Training Program

## 2021-12-29 ENCOUNTER — Inpatient Hospital Stay
Admit: 2021-12-29 | Discharge: 2021-12-30 | Disposition: A | Discharge status: Home / Self Care | Payer: MEDICARE | Attending: Emergency Medicine

## 2021-12-29 DIAGNOSIS — D5700 Hb-SS disease with crisis, unspecified: Secondary | ICD-10-CM

## 2021-12-29 MED ORDER — DIPHENHYDRAMINE HCL 25 MG PO CAPS
25 MG | ORAL | Status: AC
Start: 2021-12-29 — End: 2021-12-29
  Administered 2021-12-29: 25 mg via ORAL

## 2021-12-29 MED ORDER — SODIUM CHLORIDE 0.9 % IV BOLUS
0.9 % | INTRAVENOUS | Status: AC
Start: 2021-12-29 — End: 2021-12-29
  Administered 2021-12-30: 500 mL via INTRAVENOUS

## 2021-12-29 MED ORDER — OXYCODONE-ACETAMINOPHEN 10-325 MG PO TABS
10-325 MG | ORAL | Status: AC
Start: 2021-12-29 — End: 2021-12-29
  Administered 2021-12-29: 1 via ORAL

## 2021-12-29 MED FILL — DIPHENHYDRAMINE HCL 25 MG PO CAPS: 25 MG | ORAL | Qty: 1

## 2021-12-29 MED FILL — OXYCODONE-ACETAMINOPHEN 10-325 MG PO TABS: 10-325 MG | ORAL | Qty: 1

## 2021-12-29 NOTE — Discharge Instructions (Addendum)
Follow for fever or any infectious changes  Make certain you are staying hydrated  Norco 7.5 for unrelieved pain  Certain you are taking your medications including Xarelto as directed  We have discussed your lab work with you

## 2021-12-29 NOTE — ED Provider Notes (Signed)
Emergency Department Provider Note                   PCP:                Lupita Raider, DO               Age: 46 y.o.      Sex: female     No diagnosis found.    DISPOSITION         Medical Decision Making  Patient is here with quite reproducible pain that extends from her chest to the left lower back and down her leg areas all over sore to palpation and movement.  She does not have any leg swelling though.  Pain to her leg is more lateral thigh and does not have any calf tenderness or medial thigh pain she is on Xarelto and states she is compliant with this no significant air hunger or hypoxic since.  Her chest x-ray has no infiltrate.  I have discussed advanced imaging such as CT PE protocol for further evaluation patient has declined at present.  She is to use a very low threshold to return with any worsening problems.  Patient states an awareness that this would be essential and assurance that she would.  At present patient does not studies available have a need to be inpatient for further care but as mentioned she also has an awareness that she should use a low threshold to return    Amount and/or Complexity of Data Reviewed  Labs: ordered.     Details: I have reviewed charts from [facility] guarding prior pain episodes  Radiology: ordered.    Risk  Prescription drug management.         I have reviewed records from an external source: ED records from outside this hospital.  Considerations: Hospitalization was considered.          No orders of the defined types were placed in this encounter.       Medications - No data to display    New Prescriptions    No medications on file        [name] Bright is a 46 y.o. female who presents to the Emergency Department with chief complaint of  No chief complaint on file.     Patient comes in with multiple days probably 3 to 4 days of pain underneath her left arm radiates into her back.  Down her leg.  No incontinence no cauda equina.  She has had a slight cough since last  night.  Plus or minus yellow-green sputum that is fairly scant.  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  Patient is known to have sickle cell disease.  When asked what helps her pain episodes she states sometimes Toradol and sometimes Dilaudid.  She has no urinary symptoms.  No close contacts are sick.  Slight soreness to rib area.  No pleuritic type pain.  Presenting chest pain is made worse by palpation to the same area and does not have similar pain to palpation other than these regions    The history is provided by the patient.   Leg Pain  This is a new problem. The current episode started more than 2 days ago (3-4 days). The problem occurs constantly. The problem has not changed since onset.Pertinent negatives include no abdominal pain, no headaches and no shortness of breath. Exacerbated by: palpation and movement.   Leg Injury  Associated symptoms:  no fever       Review of Systems   Constitutional:  Negative for chills and fever.   Respiratory:  Negative for shortness of breath.    Gastrointestinal:  Negative for abdominal pain.   Genitourinary:  Negative for difficulty urinating.   Neurological:  Negative for syncope and headaches.   Psychiatric/Behavioral:  Negative for confusion and decreased concentration.    All other systems reviewed and are negative.    Past Medical History:   Diagnosis Date    Seizures (HCC)     Sickle cell disease (HCC)     Stroke (HCC)         No past surgical history on file.     No family history on file.     Social History     Socioeconomic History    Marital status: Single   Tobacco Use    Smoking status: Never    Smokeless tobacco: Never   Substance and Sexual Activity    Alcohol use: Never    Drug use: Never        Allergies: Latex, Ceftriaxone, Fentanyl, Influenza vaccines, Meperidine, Morphine, and Oxycodone    Previous Medications    CYANOCOBALAMIN 100 MCG TABLET    Take 100 mcg by mouth daily    CYCLOBENZAPRINE (FLEXERIL) 10 MG TABLET        DIPHENHYDRAMINE-APAP,  SLEEP, (TYLENOL PM EXTRA STRENGTH) 25-500 MG TABLET    Take 1 tablet by mouth daily    FLUOXETINE (PROZAC) 20 MG CAPSULE    Take 40 mg by mouth daily    FLUTICASONE (FLONASE) 50 MCG/ACT NASAL SPRAY    2 sprays by Each Nostril route in the morning.    FOLIC ACID (FOLVITE) 1 MG TABLET    Take 1 mg by mouth daily    HYDROXYUREA (HYDREA) 500 MG CHEMO CAPSULE    Take 2 capsules by mouth in the morning.    LEVETIRACETAM (KEPPRA) 750 MG TABLET    Take 2 tablets by mouth in the morning and 2 tablets before bedtime.    MEDROXYPROGESTERONE (PROVERA) 10 MG TABLET    Take 2 tablets by mouth in the morning and at bedtime for 3 days, THEN 1 tablet daily.    ONDANSETRON (ZOFRAN ODT) 8 MG TBDP DISINTEGRATING TABLET    Place 0.5-1 tablets under the tongue every 6-8 hours as needed for Nausea or Vomiting    POTASSIUM CHLORIDE (KLOR-CON M) 20 MEQ EXTENDED RELEASE TABLET    Take 1 tablet by mouth in the morning for 5 days.    RIVAROXABAN (XARELTO) 20 MG TABS TABLET    Take 20 mg by mouth daily (with breakfast)    VITAMIN D (ERGOCALCIFEROL) 1.25 MG (50000 UT) CAPS CAPSULE    Take 1 capsule by mouth in the morning for 14 days.        Vitals signs and nursing note reviewed.   Patient Vitals for the past 4 hrs:   Temp Pulse Resp BP SpO2   12/29/21 1741 99 ??F (37.2 ??C) 96 16 105/85 100 %          Physical Exam  Vitals and nursing note reviewed.   Constitutional:       General: She is not in acute distress.     Appearance: She is not toxic-appearing or diaphoretic.      Comments: Patient is normotensive.  She is often observed and appears to be fairly comfortable on her phone reviewing items.  No significant cough in our presence.  HENT:      Head: Atraumatic.      Right Ear: External ear normal.      Left Ear: External ear normal.      Nose: Nose normal.   Eyes:      General: No scleral icterus.  Cardiovascular:      Rate and Rhythm: Normal rate and regular rhythm.      Pulses: Normal pulses.   Pulmonary:      Effort: Pulmonary effort is  normal. No respiratory distress.      Breath sounds: Normal breath sounds. No stridor. No wheezing.   Abdominal:      Palpations: Abdomen is soft.      Tenderness: There is no abdominal tenderness. There is no right CVA tenderness, left CVA tenderness, guarding or rebound.   Musculoskeletal:         General: No swelling or tenderness.      Comments: No unilateral significant area of leg or arm edema/no area of significant edema regardless of unilateral   Skin:     Coloration: Skin is not pale.      Findings: No erythema or rash.   Neurological:      Mental Status: She is alert. Mental status is at baseline.   Psychiatric:         Behavior: Behavior normal.            No results found for any visits on 12/29/21.     No orders to display                         Voice dictation software was used during the making of this note.  This software is not perfect and grammatical and other typographical errors may be present.  This note has not been completely proofread for errors.     Hal Moralesaniel J Shardai Star, MD  01/03/22 [phone]

## 2021-12-29 NOTE — ED Notes (Signed)
I have reviewed discharge instructions with the patient.  The patient verbalized understanding.    Patient left ED via Discharge Method: ambulatory to Home with self.    Opportunity for questions and clarification provided.       Patient given 1 scripts.         To continue your aftercare when you leave the hospital, you may receive an automated call from our care team to check in on how you are doing.  This is a free service and part of our promise to provide the best care and service to meet your aftercare needs.??? If you have questions, or wish to unsubscribe from this service please call [phone].  Thank you for Choosing our [facility] Emergency Department.          Thurnell Lose, RN  12/29/21 2356

## 2021-12-29 NOTE — ED Triage Notes (Signed)
Pt reports having pain on left side of body that starts under left axilla and radiates down her left leg over the last three days that worsens when she is lying down. States it hurts to take a deep breath, denies shortness of breath. Has hx of blood clots and sickle cell. Takes Xarelto. Has been taking Tylenol PM with minimal relief of pain. Denies recent injury or trauma.

## 2021-12-30 LAB — CBC WITH AUTO DIFFERENTIAL
Absolute Eos #: 0 10*3/uL (ref 0.0–0.8)
Absolute Immature Granulocyte: 0 10*3/uL (ref 0.0–0.5)
Absolute Lymph #: 2.3 10*3/uL (ref 0.5–4.6)
Absolute Mono #: 0.8 10*3/uL (ref 0.1–1.3)
Basophils Absolute: 0 10*3/uL (ref 0.0–0.2)
Basophils: 0 % (ref 0.0–2.0)
Eosinophils %: 1 % (ref 0.5–7.8)
Hematocrit: 26.7 % — ABNORMAL LOW (ref 35.8–46.3)
Hemoglobin: 9.6 g/dL — ABNORMAL LOW (ref 11.7–15.4)
Immature Granulocytes: 0 % (ref 0.0–5.0)
Lymphocytes: 41 % (ref 13–44)
MCH: 41.6 PG — ABNORMAL HIGH (ref 26.1–32.9)
MCHC: 36 g/dL — ABNORMAL HIGH (ref 31.4–35.0)
MCV: 115.6 FL — ABNORMAL HIGH (ref 82–102)
MPV: 9.2 FL — ABNORMAL LOW (ref 9.4–12.3)
Monocytes: 13 % — ABNORMAL HIGH (ref 4.0–12.0)
Platelets: 507 10*3/uL — ABNORMAL HIGH (ref 150–450)
RBC: 2.31 M/uL — ABNORMAL LOW (ref 4.05–5.2)
RDW: 14.7 % — ABNORMAL HIGH (ref 11.9–14.6)
Seg Neutrophils: 45 % (ref 43–78)
Segs Absolute: 2.5 10*3/uL (ref 1.7–8.2)
WBC: 5.6 10*3/uL (ref 4.3–11.1)
nRBC: 0.1 10*3/uL (ref 0.0–0.2)

## 2021-12-30 LAB — URINALYSIS
Bilirubin Urine: NEGATIVE
Blood, Urine: NEGATIVE
Glucose, UA: NEGATIVE mg/dL
Ketones, Urine: NEGATIVE mg/dL
Leukocyte Esterase, Urine: NEGATIVE
Nitrite, Urine: NEGATIVE
Protein, UA: NEGATIVE mg/dL
Specific Gravity, UA: 1.011 (ref 1.001–1.023)
Urobilinogen, Urine: 1 EU/dL (ref 0.2–1.0)
pH, Urine: 7 (ref 5.0–9.0)

## 2021-12-30 LAB — COMPREHENSIVE METABOLIC PANEL
ALT: 23 U/L (ref 12–65)
AST: 43 U/L — ABNORMAL HIGH (ref 15–37)
Albumin/Globulin Ratio: 0.9 (ref 0.4–1.6)
Albumin: 3.9 g/dL (ref 3.5–5.0)
Alk Phosphatase: 106 U/L (ref 50–136)
Anion Gap: 7 mmol/L (ref 2–11)
BUN: 10 MG/DL (ref 6–23)
CO2: 23 mmol/L (ref 21–32)
Calcium: 8.9 MG/DL (ref 8.3–10.4)
Chloride: 110 mmol/L (ref 101–110)
Creatinine: 1.1 MG/DL — ABNORMAL HIGH (ref 0.6–1.0)
Est, Glom Filt Rate: >60 mL/min/{1.73_m2} (ref >60)
Globulin: 4.3 g/dL (ref 2.8–4.5)
Glucose: 97 mg/dL (ref 65–100)
Potassium: 4.8 mmol/L (ref 3.5–5.1)
Sodium: 140 mmol/L (ref 133–143)
Total Bilirubin: 0.9 MG/DL (ref 0.2–1.1)
Total Protein: 8.2 g/dL (ref 6.3–8.2)

## 2021-12-30 LAB — RETICULOCYTES
Absolute Retic #: 0.1097 M/ul — ABNORMAL HIGH (ref 0.026–0.095)
Immature Retic Fraction: 22.3 % — ABNORMAL HIGH (ref 3.0–15.9)
Retic Hemoglobin conc.: 37 pg — ABNORMAL HIGH (ref 29–35)
Reticulocyte Count,Automated: 4.8 % — ABNORMAL HIGH (ref 0.3–2.0)

## 2021-12-30 LAB — D-DIMER, QUANTITATIVE: D-Dimer, Quant: 0.58 ug/ml(FEU) — ABNORMAL HIGH (ref <0.56)

## 2021-12-30 LAB — MAGNESIUM: Magnesium: 2.2 mg/dL (ref 1.8–2.4)

## 2021-12-30 MED ORDER — KETOROLAC TROMETHAMINE 30 MG/ML IJ SOLN
30 MG/ML | Freq: Once | INTRAMUSCULAR | Status: AC
Start: 2021-12-30 — End: 2021-12-29
  Administered 2021-12-30: 04:00:00 30 mg via INTRAVENOUS

## 2021-12-30 MED ORDER — HYDROCODONE-ACETAMINOPHEN 7.5-325 MG PO TABS
7.5-325 MG | ORAL_TABLET | Freq: Four times a day (QID) | ORAL | 0 refills | Status: DC | PRN
Start: 2021-12-30 — End: 2021-12-29

## 2021-12-30 MED ORDER — HYDROMORPHONE HCL PF 1 MG/ML IJ SOLN
1 MG/ML | INTRAMUSCULAR | Status: AC
Start: 2021-12-30 — End: 2021-12-29
  Administered 2021-12-30: 04:00:00 1 mg via INTRAMUSCULAR

## 2021-12-30 MED ORDER — HYDROMORPHONE HCL PF 1 MG/ML IJ SOLN
1 MG/ML | INTRAMUSCULAR | Status: AC
Start: 2021-12-30 — End: 2021-12-29
  Administered 2021-12-30: 02:00:00 1 mg via INTRAMUSCULAR

## 2021-12-30 MED ORDER — HYDROCODONE-ACETAMINOPHEN 7.5-325 MG PO TABS
7.5-325 MG | ORAL_TABLET | Freq: Four times a day (QID) | ORAL | 0 refills | Status: AC | PRN
Start: 2021-12-30 — End: 2022-01-02

## 2021-12-30 MED ORDER — HYDROMORPHONE HCL PF 1 MG/ML IJ SOLN
1 MG/ML | INTRAMUSCULAR | Status: AC
Start: 2021-12-30 — End: 2021-12-29
  Administered 2021-12-30: 01:00:00 1 mg via INTRAMUSCULAR

## 2021-12-30 MED FILL — HYDROMORPHONE HCL 1 MG/ML IJ SOLN: 1 MG/ML | INTRAMUSCULAR | Qty: 2

## 2021-12-30 MED FILL — KETOROLAC TROMETHAMINE 30 MG/ML IJ SOLN: 30 MG/ML | INTRAMUSCULAR | Qty: 1

## 2021-12-30 MED FILL — HYDROMORPHONE HCL 1 MG/ML IJ SOLN: 1 MG/ML | INTRAMUSCULAR | Qty: 1

## 2022-01-04 ENCOUNTER — Encounter

## 2022-01-05 ENCOUNTER — Encounter: Payer: MEDICARE | Primary: Student in an Organized Health Care Education/Training Program

## 2022-01-05 ENCOUNTER — Encounter: Payer: MEDICARE | Attending: Hematology | Primary: Student in an Organized Health Care Education/Training Program

## 2022-01-11 DIAGNOSIS — D5700 Hb-SS disease with crisis, unspecified: Principal | ICD-10-CM

## 2022-01-11 NOTE — ED Provider Notes (Signed)
Emergency Department Provider Note                   PCP:                Lupita Raider, DO               Age: 46 y.o.      Sex: female       ICD-10-CM    1. Sickle cell pain crisis (HCC)  D57.00           DISPOSITION          Medical Decision Making  Check her basic blood work.  I will treat her pain with some IV Dilaudid and IV fluids.    I reviewed her external medical records from her hematology visits.    Amount and/or Complexity of Data Reviewed  Labs: ordered.    Risk  Prescription drug management.                          ED Course as of 01/12/22 0337   Thu Jan 12, 2022   0309 Patient says she continues to have pain despite the different medications.  Therefore I will plan to admit her to the hospital.  In shared decision-making with the patient I discussed the options with her and she is in favor of being admitted to the hospital. [AC]   5431 46 year old lady with a history of sickle cell SS presents with pain.  She normally sees Dr. Park Breed from hematology.  I have given her 2 rounds of Dilaudid, IV Compazine, oral Benadryl, and IV fluids and she has had no relief from her pain.  Her hemoglobin is 9.7 which is very stable for her and her metabolic panel is unremarkable. [AC]      ED Course User Index  [AC] Emeline Darling, MD        Orders Placed This Encounter   Procedures    CBC with Auto Differential    Basic Metabolic Panel        Medications   HYDROmorphone HCl PF (DILAUDID) injection 1 mg (1 mg IntraVENous Given 01/12/22 0029)   0.9 % sodium chloride bolus (1,000 mLs IntraVENous New Bag 01/12/22 0031)   prochlorperazine (COMPAZINE) injection 10 mg (10 mg IntraVENous Given 01/12/22 0131)   diphenhydrAMINE (BENADRYL) capsule 25 mg (25 mg Oral Given 01/12/22 0130)   HYDROmorphone HCl PF (DILAUDID) injection 1 mg (1 mg IntraVENous Given 01/12/22 0133)       New Prescriptions    No medications on file        [name] Bright is a 46 y.o. female who presents to the Emergency Department with chief complaint of    Chief  Complaint   Patient presents with    Sickle Cell Pain Crisis      46 year old lady presents with concerns about diffuse pain.  She says she has a history of SS sickle cell disease and this is what her pain is usually like.  She denies any recent fevers or chills.  She had no cough or shortness of breath.    No injuries.    No other associated symptoms.    Elements of this note were created using speech recognition software.  As such, errors of speech recognition may be present.        Review of Systems   Constitutional:  Negative for activity change, chills and fever.   HENT:  Negative  for congestion and sore throat.    Eyes:  Negative for redness and visual disturbance.   Respiratory:  Negative for cough, shortness of breath and wheezing.    Cardiovascular:  Negative for chest pain and palpitations.   Gastrointestinal:  Negative for abdominal pain, diarrhea, nausea and vomiting.   Endocrine: Negative for polydipsia and polyuria.   Genitourinary:  Negative for flank pain and hematuria.   Musculoskeletal:  Positive for arthralgias and myalgias. Negative for joint swelling.   Skin:  Negative for color change and rash.   Allergic/Immunologic: Negative for immunocompromised state.   Neurological:  Negative for dizziness, light-headedness and headaches.   Hematological:  Negative for adenopathy.   Psychiatric/Behavioral:  Negative for confusion.      Past Medical History:   Diagnosis Date    Seizures (HCC)     Sickle cell disease (HCC)     Stroke (HCC)         No past surgical history on file.     No family history on file.     Social History     Socioeconomic History    Marital status: Single   Tobacco Use    Smoking status: Never    Smokeless tobacco: Never   Substance and Sexual Activity    Alcohol use: Never    Drug use: Never         Latex, Ceftriaxone, Fentanyl, Influenza vaccines, Meperidine, Morphine, and Oxycodone     Previous Medications    CYANOCOBALAMIN 100 MCG TABLET    Take 100 mcg by mouth daily     CYCLOBENZAPRINE (FLEXERIL) 10 MG TABLET        DIPHENHYDRAMINE-APAP, SLEEP, (TYLENOL PM EXTRA STRENGTH) 25-500 MG TABLET    Take 1 tablet by mouth daily    FLUOXETINE (PROZAC) 20 MG CAPSULE    Take 40 mg by mouth daily    FLUTICASONE (FLONASE) 50 MCG/ACT NASAL SPRAY    2 sprays by Each Nostril route in the morning.    FOLIC ACID (FOLVITE) 1 MG TABLET    Take 1 mg by mouth daily    HYDROXYUREA (HYDREA) 500 MG CHEMO CAPSULE    Take 2 capsules by mouth in the morning.    LEVETIRACETAM (KEPPRA) 750 MG TABLET    Take 2 tablets by mouth in the morning and 2 tablets before bedtime.    MEDROXYPROGESTERONE (PROVERA) 10 MG TABLET    Take 2 tablets by mouth in the morning and at bedtime for 3 days, THEN 1 tablet daily.    ONDANSETRON (ZOFRAN ODT) 8 MG TBDP DISINTEGRATING TABLET    Place 0.5-1 tablets under the tongue every 6-8 hours as needed for Nausea or Vomiting    POTASSIUM CHLORIDE (KLOR-CON M) 20 MEQ EXTENDED RELEASE TABLET    Take 1 tablet by mouth in the morning for 5 days.    RIVAROXABAN (XARELTO) 20 MG TABS TABLET    Take 20 mg by mouth daily (with breakfast)    VITAMIN D (ERGOCALCIFEROL) 1.25 MG (50000 UT) CAPS CAPSULE    Take 1 capsule by mouth in the morning for 14 days.        Vitals signs and nursing note reviewed.   Patient Vitals for the past 4 hrs:   Temp Pulse Resp BP SpO2   01/12/22 0231 -- -- -- -- 90 %   01/12/22 0229 -- -- -- 110/85 --   01/12/22 0100 -- -- -- 110/75 100 %   01/12/22 0045 -- -- -- (!) 114/90 99 %  01/12/22 0031 -- -- -- -- 98 %   01/12/22 0028 -- -- -- (!) 123/112 --   01/12/22 0002 98.6 ??F (37 ??C) 77 18 136/70 100 %          Physical Exam  Vitals and nursing note reviewed.   Constitutional:       Appearance: Normal appearance.   HENT:      Head: Normocephalic and atraumatic.      Mouth/Throat:      Mouth: Mucous membranes are moist.   Eyes:      General:         Right eye: No discharge.         Left eye: No discharge.   Cardiovascular:      Rate and Rhythm: Normal rate and regular  rhythm.      Pulses: Normal pulses.   Pulmonary:      Effort: Pulmonary effort is normal.      Breath sounds: Normal breath sounds. No wheezing.   Abdominal:      General: Bowel sounds are normal.      Tenderness: There is no abdominal tenderness. There is no guarding or rebound.   Musculoskeletal:      Right lower leg: No edema.      Left lower leg: No edema.   Lymphadenopathy:      Cervical: No cervical adenopathy.   Skin:     Coloration: Skin is not jaundiced.   Neurological:      General: No focal deficit present.      Mental Status: She is alert and oriented to person, place, and time. Mental status is at baseline.        Procedures    Results for orders placed or performed during the hospital encounter of 01/12/22   CBC with Auto Differential   Result Value Ref Range    WBC 5.4 4.3 - 11.1 K/uL    RBC 2.35 (L) 4.05 - 5.2 M/uL    Hemoglobin 9.7 (L) 11.7 - 15.4 g/dL    Hematocrit 16.127.7 (L) 35.8 - 46.3 %    MCV 117.9 (H) 82 - 102 FL    MCH 41.3 (H) 26.1 - 32.9 PG    MCHC 35.0 31.4 - 35.0 g/dL    RDW 09.616.0 (H) 04.511.9 - 14.6 %    Platelets 402 150 - 450 K/uL    MPV 10.1 9.4 - 12.3 FL    nRBC 0.14 0.0 - 0.2 K/uL    Differential Type AUTOMATED      Seg Neutrophils 42 (L) 43 - 78 %    Lymphocytes 43 13 - 44 %    Monocytes 12 4.0 - 12.0 %    Eosinophils % 2 0.5 - 7.8 %    Basophils 1 0.0 - 2.0 %    Immature Granulocytes 0 0.0 - 5.0 %    Segs Absolute 2.3 1.7 - 8.2 K/UL    Absolute Lymph # 2.3 0.5 - 4.6 K/UL    Absolute Mono # 0.7 0.1 - 1.3 K/UL    Absolute Eos # 0.1 0.0 - 0.8 K/UL    Basophils Absolute 0.0 0.0 - 0.2 K/UL    Absolute Immature Granulocyte 0.0 0.0 - 0.5 K/UL   Basic Metabolic Panel   Result Value Ref Range    Sodium 141 133 - 143 mmol/L    Potassium 4.7 3.5 - 5.1 mmol/L    Chloride 112 (H) 101 - 110 mmol/L    CO2 24 21 - 32  mmol/L    Anion Gap 5 2 - 11 mmol/L    Glucose 89 65 - 100 mg/dL    BUN 10 6 - 23 MG/DL    Creatinine 1.61 (H) 0.6 - 1.0 MG/DL    Est, Glom Filt Rate 56 (L) >60 ml/min/1.24m2    Calcium 9.1  8.3 - 10.4 MG/DL        No orders to display                       Voice dictation software was used during the making of this note.  This software is not perfect and grammatical and other typographical errors may be present.  This note has not been completely proofread for errors.        Emeline Darling, MD  01/12/22 [phone]

## 2022-01-12 ENCOUNTER — Inpatient Hospital Stay
Admit: 2022-01-12 | Discharge: 2022-01-17 | Disposition: A | Discharge status: Home / Self Care | Payer: MEDICARE | Admitting: Internal Medicine

## 2022-01-12 DIAGNOSIS — D5700 Hb-SS disease with crisis, unspecified: Secondary | ICD-10-CM

## 2022-01-12 LAB — CBC WITH AUTO DIFFERENTIAL
Absolute Eos #: 0.1 10*3/uL (ref 0.0–0.8)
Absolute Immature Granulocyte: 0 10*3/uL (ref 0.0–0.5)
Absolute Lymph #: 2.3 10*3/uL (ref 0.5–4.6)
Absolute Mono #: 0.7 10*3/uL (ref 0.1–1.3)
Basophils Absolute: 0 10*3/uL (ref 0.0–0.2)
Basophils: 1 % (ref 0.0–2.0)
Eosinophils %: 2 % (ref 0.5–7.8)
Hematocrit: 27.7 % — ABNORMAL LOW (ref 35.8–46.3)
Hemoglobin: 9.7 g/dL — ABNORMAL LOW (ref 11.7–15.4)
Immature Granulocytes: 0 % (ref 0.0–5.0)
Lymphocytes: 43 % (ref 13–44)
MCH: 41.3 PG — ABNORMAL HIGH (ref 26.1–32.9)
MCHC: 35 g/dL (ref 31.4–35.0)
MCV: 117.9 FL — ABNORMAL HIGH (ref 82–102)
MPV: 10.1 FL (ref 9.4–12.3)
Monocytes: 12 % (ref 4.0–12.0)
Platelets: 402 10*3/uL (ref 150–450)
RBC: 2.35 M/uL — ABNORMAL LOW (ref 4.05–5.2)
RDW: 16 % — ABNORMAL HIGH (ref 11.9–14.6)
Seg Neutrophils: 42 % — ABNORMAL LOW (ref 43–78)
Segs Absolute: 2.3 10*3/uL (ref 1.7–8.2)
WBC: 5.4 10*3/uL (ref 4.3–11.1)
nRBC: 0.14 10*3/uL (ref 0.0–0.2)

## 2022-01-12 LAB — URINALYSIS
BACTERIA, URINE: NEGATIVE /hpf
Bilirubin Urine: NEGATIVE
Glucose, UA: NEGATIVE mg/dL
Ketones, Urine: NEGATIVE mg/dL
Leukocyte Esterase, Urine: NEGATIVE
Nitrite, Urine: NEGATIVE
Protein, UA: NEGATIVE mg/dL
Specific Gravity, UA: 1.011 (ref 1.001–1.023)
Urobilinogen, Urine: 1 EU/dL (ref 0.2–1.0)
pH, Urine: 6 (ref 5.0–9.0)

## 2022-01-12 LAB — BASIC METABOLIC PANEL
Anion Gap: 5 mmol/L (ref 2–11)
BUN: 10 MG/DL (ref 6–23)
CO2: 24 mmol/L (ref 21–32)
Calcium: 9.1 MG/DL (ref 8.3–10.4)
Chloride: 112 mmol/L — ABNORMAL HIGH (ref 101–110)
Creatinine: 1.2 MG/DL — ABNORMAL HIGH (ref 0.6–1.0)
Est, Glom Filt Rate: 56 mL/min/{1.73_m2} — ABNORMAL LOW (ref >60)
Glucose: 89 mg/dL (ref 65–100)
Potassium: 4.7 mmol/L (ref 3.5–5.1)
Sodium: 141 mmol/L (ref 133–143)

## 2022-01-12 LAB — RETICULOCYTES
Absolute Retic #: 0.1223 M/ul — ABNORMAL HIGH (ref 0.026–0.095)
Immature Retic Fraction: 24.8 % — ABNORMAL HIGH (ref 3.0–15.9)
Retic Hemoglobin conc.: 35 pg (ref 29–35)
Reticulocyte Count,Automated: 6.3 % — ABNORMAL HIGH (ref 0.3–2.0)

## 2022-01-12 MED ORDER — POLYETHYLENE GLYCOL 3350 17 G PO PACK
17 g | Freq: Every day | ORAL | Status: AC | PRN
Start: 2022-01-12 — End: 2022-01-17

## 2022-01-12 MED ORDER — ONDANSETRON 4 MG PO TBDP
4 MG | Freq: Three times a day (TID) | ORAL | Status: DC | PRN
Start: 2022-01-12 — End: 2022-01-17

## 2022-01-12 MED ORDER — FLUOXETINE HCL 20 MG PO CAPS
20 MG | Freq: Every day | ORAL | Status: AC
Start: 2022-01-12 — End: 2022-01-17
  Administered 2022-01-12 – 2022-01-17 (×6): 40 mg via ORAL

## 2022-01-12 MED ORDER — PROCHLORPERAZINE EDISYLATE 10 MG/2ML IJ SOLN
10 MG/2ML | Freq: Once | INTRAMUSCULAR | Status: AC
Start: 2022-01-12 — End: 2022-01-12
  Administered 2022-01-12: 07:00:00 10 mg via INTRAVENOUS

## 2022-01-12 MED ORDER — HYDROCODONE-ACETAMINOPHEN 5-325 MG PO TABS
5-325 | Freq: Four times a day (QID) | ORAL | Status: DC | PRN
Start: 2022-01-12 — End: 2022-01-16
  Administered 2022-01-12 – 2022-01-16 (×3): 1 via ORAL

## 2022-01-12 MED ORDER — NORMAL SALINE FLUSH 0.9 % IV SOLN
0.9 % | INTRAVENOUS | Status: AC | PRN
Start: 2022-01-12 — End: 2022-01-17
  Administered 2022-01-12 (×2): 10 mL via INTRAVENOUS
  Administered 2022-01-15: 07:00:00 20 mL via INTRAVENOUS
  Administered 2022-01-16: 08:00:00 10 mL via INTRAVENOUS

## 2022-01-12 MED ORDER — MICONAZOLE NITRATE 2 % EX POWD
2 % | Freq: Two times a day (BID) | CUTANEOUS | Status: AC
Start: 2022-01-12 — End: 2022-01-17
  Administered 2022-01-12 – 2022-01-17 (×10): via TOPICAL

## 2022-01-12 MED ORDER — HYDROMORPHONE HCL PF 1 MG/ML IJ SOLN
1 MG/ML | INTRAMUSCULAR | Status: AC | PRN
Start: 2022-01-12 — End: 2022-01-15
  Administered 2022-01-12 – 2022-01-15 (×17): 1 mg via INTRAVENOUS

## 2022-01-12 MED ORDER — NORMAL SALINE FLUSH 0.9 % IV SOLN
0.9 % | Freq: Two times a day (BID) | INTRAVENOUS | Status: AC
Start: 2022-01-12 — End: 2022-01-17
  Administered 2022-01-12 – 2022-01-17 (×10): 10 mL via INTRAVENOUS

## 2022-01-12 MED ORDER — DEXTROSE-NACL 5-0.45 % IV SOLN
5-0.45 % | INTRAVENOUS | Status: AC
Start: 2022-01-12 — End: 2022-01-17
  Administered 2022-01-12 – 2022-01-17 (×11): via INTRAVENOUS

## 2022-01-12 MED ORDER — ACETAMINOPHEN 325 MG PO TABS
325 MG | Freq: Four times a day (QID) | ORAL | Status: DC | PRN
Start: 2022-01-12 — End: 2022-01-17
  Administered 2022-01-12 – 2022-01-13 (×2): 650 mg via ORAL

## 2022-01-12 MED ORDER — HYDROMORPHONE HCL PF 1 MG/ML IJ SOLN
1 MG/ML | INTRAMUSCULAR | Status: AC
Start: 2022-01-12 — End: 2022-01-12
  Administered 2022-01-12: 07:00:00 1 mg via INTRAVENOUS

## 2022-01-12 MED ORDER — FLUTICASONE PROPIONATE 50 MCG/ACT NA SUSP
50 MCG/ACT | Freq: Every day | NASAL | Status: DC
Start: 2022-01-12 — End: 2022-01-17
  Administered 2022-01-13 – 2022-01-17 (×5): 2 via NASAL

## 2022-01-12 MED ORDER — ONDANSETRON HCL 4 MG/2ML IJ SOLN
42 MG/2ML | Freq: Four times a day (QID) | INTRAMUSCULAR | Status: DC | PRN
Start: 2022-01-12 — End: 2022-01-17

## 2022-01-12 MED ORDER — HYDROMORPHONE HCL PF 1 MG/ML IJ SOLN
1 MG/ML | INTRAMUSCULAR | Status: AC
Start: 2022-01-12 — End: 2022-01-12
  Administered 2022-01-12: 05:00:00 1 mg via INTRAVENOUS

## 2022-01-12 MED ORDER — FOLIC ACID 1 MG PO TABS
1 MG | Freq: Every day | ORAL | Status: AC
Start: 2022-01-12 — End: 2022-01-17
  Administered 2022-01-12 – 2022-01-17 (×6): 1 mg via ORAL

## 2022-01-12 MED ORDER — HYDROXYUREA 500 MG PO CAPS
500 MG | Freq: Every day | ORAL | Status: AC
Start: 2022-01-12 — End: 2022-01-17
  Administered 2022-01-12 – 2022-01-17 (×6): 1000 mg via ORAL

## 2022-01-12 MED ORDER — SODIUM CHLORIDE 0.9 % IV SOLN
0.9 % | INTRAVENOUS | Status: DC
Start: 2022-01-12 — End: 2022-01-12
  Administered 2022-01-12: 14:00:00 via INTRAVENOUS

## 2022-01-12 MED ORDER — DIPHENHYDRAMINE HCL 25 MG PO CAPS
25 MG | ORAL | Status: AC
Start: 2022-01-12 — End: 2022-01-12
  Administered 2022-01-12: 07:00:00 25 mg via ORAL

## 2022-01-12 MED ORDER — ALUM & MAG HYDROXIDE-SIMETH 200-200-20 MG/5ML PO SUSP
200-200-20 MG/5ML | Freq: Four times a day (QID) | ORAL | Status: AC | PRN
Start: 2022-01-12 — End: 2022-01-17

## 2022-01-12 MED ORDER — SODIUM CHLORIDE 0.9 % IV BOLUS
0.9 % | Freq: Once | INTRAVENOUS | Status: AC
Start: 2022-01-12 — End: 2022-01-12
  Administered 2022-01-12: 06:00:00 1000 mL via INTRAVENOUS

## 2022-01-12 MED ORDER — SODIUM CHLORIDE 0.9 % IV SOLN
0.9 % | INTRAVENOUS | Status: AC | PRN
Start: 2022-01-12 — End: 2022-01-17

## 2022-01-12 MED ORDER — DIPHENHYDRAMINE HCL 25 MG PO CAPS
25 MG | Freq: Four times a day (QID) | ORAL | Status: AC | PRN
Start: 2022-01-12 — End: 2022-01-17
  Administered 2022-01-12 – 2022-01-17 (×5): 25 mg via ORAL

## 2022-01-12 MED ORDER — RIVAROXABAN 20 MG PO TABS
20 MG | Freq: Every day | ORAL | Status: AC
Start: 2022-01-12 — End: 2022-01-17
  Administered 2022-01-12 – 2022-01-17 (×6): 20 mg via ORAL

## 2022-01-12 MED ORDER — ACETAMINOPHEN 650 MG RE SUPP
650 | Freq: Four times a day (QID) | RECTAL | Status: DC | PRN
Start: 2022-01-12 — End: 2022-01-12

## 2022-01-12 MED ORDER — LEVETIRACETAM 500 MG PO TABS
500 MG | Freq: Two times a day (BID) | ORAL | Status: AC
Start: 2022-01-12 — End: 2022-01-17
  Administered 2022-01-12 – 2022-01-17 (×11): 1500 mg via ORAL

## 2022-01-12 MED ORDER — HYDROMORPHONE HCL PF 1 MG/ML IJ SOLN
1 MG/ML | INTRAMUSCULAR | Status: DC | PRN
Start: 2022-01-12 — End: 2022-01-12
  Administered 2022-01-12: 14:00:00 1 mg via INTRAVENOUS

## 2022-01-12 MED FILL — PROCHLORPERAZINE EDISYLATE 10 MG/2ML IJ SOLN: 10 MG/2ML | INTRAMUSCULAR | Qty: 2

## 2022-01-12 MED FILL — HYDROMORPHONE HCL 1 MG/ML IJ SOLN: 1 MG/ML | INTRAMUSCULAR | Qty: 1

## 2022-01-12 MED FILL — FLUOXETINE HCL 20 MG PO CAPS: 20 MG | ORAL | Qty: 2

## 2022-01-12 MED FILL — ACETAMINOPHEN 650 MG RE SUPP: 650 MG | RECTAL | Qty: 1

## 2022-01-12 MED FILL — DILAUDID 1 MG/ML IJ SOLN: 1 MG/ML | INTRAMUSCULAR | Qty: 1

## 2022-01-12 MED FILL — ANTIFUNGAL 2 % EX POWD: 2 % | CUTANEOUS | Qty: 71

## 2022-01-12 MED FILL — ACETAMINOPHEN 325 MG PO TABS: 325 MG | ORAL | Qty: 2

## 2022-01-12 MED FILL — HYDROCODONE-ACETAMINOPHEN 5-325 MG PO TABS: 5-325 MG | ORAL | Qty: 1

## 2022-01-12 MED FILL — XARELTO 20 MG PO TABS: 20 MG | ORAL | Qty: 1

## 2022-01-12 MED FILL — DIPHENHYDRAMINE HCL 25 MG PO CAPS: 25 MG | ORAL | Qty: 1

## 2022-01-12 MED FILL — FOLIC ACID 1 MG PO TABS: 1 MG | ORAL | Qty: 1

## 2022-01-12 MED FILL — LEVETIRACETAM 500 MG PO TABS: 500 MG | ORAL | Qty: 3

## 2022-01-12 MED FILL — HYDROXYUREA 500 MG PO CAPS: 500 MG | ORAL | Qty: 2

## 2022-01-12 NOTE — Progress Notes (Signed)
.  END OF SHIFT NOTE:    INTAKE/OUTPUT  No intake/output data recorded.  Voiding: Yes  Catheter: No  Drain:              Flatus: Patient does have flatus present.    Stool:  occurrences.    Characteristics:           Stool Assessment  Last BM (including prior to admit): 01/11/22    Emesis:  occurrences.    Characteristics:        VITAL SIGNS  Patient Vitals for the past 12 hrs:   Temp Pulse Resp BP SpO2   01/12/22 1403 98.1 ??F (36.7 ??C) 93 18 (!) 148/80 97 %   01/12/22 1145 -- -- -- 125/69 90 %   01/12/22 1130 -- -- -- (!) 142/83 94 %   01/12/22 1115 -- -- -- 108/74 92 %   01/12/22 1100 -- -- -- 110/69 91 %   01/12/22 1045 -- -- -- 117/73 92 %   01/12/22 1030 -- -- -- 119/69 94 %   01/12/22 0945 -- -- -- 118/87 94 %   01/12/22 0930 -- -- -- 117/80 94 %   01/12/22 0830 -- -- -- -- 100 %   01/12/22 0745 -- -- -- 117/67 97 %   01/12/22 0730 -- -- -- 102/63 99 %   01/12/22 0715 -- -- -- 98/82 100 %       Pain Assessment  Pain Level: 8 (01/12/22 1441)  Pain Location: Generalized (joints)       Ambulating  Yes    Shift report given to oncoming nurse at the bedside.    Wilber Oliphant, RN

## 2022-01-12 NOTE — ED Notes (Signed)
TRANSFER - OUT REPORT:    Verbal report given to Debbie on [name] Bright  being transferred to 2nd floor for routine progression of patient care       Report consisted of patient's Situation, Background, Assessment and   Recommendations(SBAR).     Information from the following report(s) Nurse Handoff Report was reviewed with the receiving nurse.  Kinder Assessment: Presents to emergency department  because of falls (Syncope, seizure, or loss of consciousness): No, Age > 70: No, Altered Mental Status, Intoxication with alcohol or substance confusion (Disorientation, impaired judgment, poor safety awaremess, or inability to follow instructions): No, Impaired Mobility: Ambulates or transfers with assistive devices or assistance; Unable to ambulate or transer.: No  Lines:   Peripheral IV 01/12/22 Left Forearm (Active)   Site Assessment Clean, dry & intact 01/12/22 0023   Line Status Blood return noted 01/12/22 0023   Phlebitis Assessment No symptoms 01/12/22 0023   Infiltration Assessment 0 01/12/22 0023   Alcohol Cap Used No 01/12/22 0023   Dressing Status New dressing applied 01/12/22 0023   Dressing Type Transparent 01/12/22 0023   Dressing Intervention New 01/12/22 0023        Opportunity for questions and clarification was provided.      Patient transported with:  Marchia Meiers, RN  01/12/22 1241

## 2022-01-12 NOTE — H&P (Addendum)
Hospitalist History and Physical   Admit Date:  01/12/2022 12:05 AM   Name:  [name] Bright   Age:  46 y.o.  Sex:  female  DOB:  December 28, 1975   MRN:  [id number]   Room:  ER36/36    Presenting Complaint: Sickle Cell Pain Crisis     Reason(s) for Admission: Sickle cell crisis (HCC) [D57.00]       Assessment & Plan:     Principal Problem:    Sickle cell anemia with crisis [name] Bright) -46 year old woman with history of sickle cell disease being admitted for sickle cell crisis.  Last admission was in July 2022.  Labs and chest x-ray are benign    Active Problems:    Gastroesophageal reflux disease      Depression      History of DVT (deep vein thrombosis)      Sickle cell crisis (HCC)      Plan:  Patient will be admitted to a regular medical bed, observation status  Continue with aggressive IV fluid hydration and pain control  Resume home medications which include Xarelto for her history of DVT  Monitor labs.  Plan of care discussed with patient and father who is at bedside.  They have been given opportunity for questions.        Anticipated discharge needs:       Diet: regular  VTE ppx: Xarelto  Code status: FULL      History of Present Illness:      Sickle Cell Pain Crisis        46 year old lady, history of sickle cell disease and DVT, presents to the emergency room with her father with concerns about diffuse pain.  She says she has a history of SS sickle cell disease and this is what her pain is usually like.  She denies any recent fevers or chills.  She had no cough or shortness of breath.  States that she has been having off-and-on pain for the last 3 weeks, in her back and legs.  Now the pain is all over.  She denies any nausea or vomiting.  Appetite has been okay.  She denies any chest pain or shortness of breath.     No injuries.  Laboratory data is benign  Chest x-ray is negative for any acute cardiopulmonary abnormalities  Patient is afebrile and pulse is 77    Patient was given multiple pain medications and IV  fluids in the ER.  She still continues to have pain and hospitalist asked to admit for sickle cell crisis    Her hematologist is Dr. Park Breed    Review of Systems:  10 systems reviewed and negative except as noted in HPI.  Patient states that she has been stressed out from having to take care of her mother who is acutely ill    States right leg is always more swollen than the left    Past History:     Past Medical History:   Diagnosis Date    Seizures (HCC)     Sickle cell disease (HCC)     Stroke [facility])        Past Surgical History:   Procedure Laterality Date    CESAREAN SECTION      LAPAROTOMY      TUBAL LIGATION          Social History     Tobacco Use    Smoking status: Never    Smokeless tobacco: Never   Substance Use Topics  Alcohol use: Never      Social History     Substance and Sexual Activity   Drug Use Never       Family History   Problem Relation Age of Onset    Diabetes Mother     Hypertension Mother     Diabetes Father     Hypertension Father           There is no immunization history on file for this patient.  Allergies   Allergen Reactions    Latex Rash    Ceftriaxone Other (See Comments)    Fentanyl Other (See Comments)     "my doctor said I had a stroke from it"    Influenza Vaccines Itching     unknown      Meperidine Other (See Comments)    Morphine Itching    Oxycodone Other (See Comments)     Prior to Admit Medications:  Current Outpatient Medications   Medication Instructions    cyanocobalamin 100 mcg, DAILY    cyclobenzaprine (FLEXERIL) 10 MG tablet No dose, route, or frequency recorded.    diphenhydrAMINE-APAP, sleep, (TYLENOL PM EXTRA STRENGTH) 25-500 MG tablet 1 tablet, Oral, DAILY    FLUoxetine (PROZAC) 40 mg, Oral, DAILY    fluticasone (FLONASE) 50 MCG/ACT nasal spray 2 sprays, Each Nostril, DAILY    folic acid (FOLVITE) 1 mg, Oral, DAILY    hydroxyurea (HYDREA) 1,000 mg, Oral, DAILY    levETIRAcetam (KEPPRA) 1,500 mg, Oral, 2 TIMES DAILY    medroxyPROGESTERone (PROVERA) 10 MG tablet Take 2  tablets by mouth in the morning and at bedtime for 3 days, THEN 1 tablet daily.    ondansetron (ZOFRAN ODT) 4-8 mg, SubLINGual, EVERY 6-8 HOURS PRN    potassium chloride (KLOR-CON M) 20 MEQ extended release tablet 20 mEq, Oral, DAILY    rivaroxaban (XARELTO) 20 mg, Oral, DAILY WITH BREAKFAST    vitamin D (ERGOCALCIFEROL) 50,000 Units, Oral, DAILY         Objective:   Patient Vitals for the past 24 hrs:   Temp Pulse Resp BP SpO2   01/12/22 0231 -- -- -- -- 90 %   01/12/22 0229 -- -- -- 110/85 --   01/12/22 0100 -- -- -- 110/75 100 %   01/12/22 0045 -- -- -- (!) 114/90 99 %   01/12/22 0031 -- -- -- -- 98 %   01/12/22 0028 -- -- -- (!) 123/112 --   01/12/22 0002 98.6 ??F (37 ??C) 77 18 136/70 100 %       Oxygen Therapy  SpO2: 90 %  O2 Device: None (Room air)    Estimated body mass index is 25.09 kg/m?? as calculated from the following:    Height as of this encounter: 5\' 8"  (1.727 m).    Weight as of this encounter: 165 lb (74.8 kg).  No intake or output data in the 24 hours ending 01/12/22 0552      Physical Exam:    Blood pressure 110/85, pulse 77, temperature 98.6 ??F (37 ??C), temperature source Oral, resp. rate 18, height 5\' 8"  (1.727 m), weight 165 lb (74.8 kg), SpO2 90 %.  General:    Well nourished.  Well developed.  Alert and oriented x3.  No acute distress.  Head:  Normocephalic, atraumatic  Eyes:  Sclerae appear normal.  Pupils equally round.  ENT:  Nares appear normal, no drainage.  Moist oral mucosa  Neck:  No restricted ROM.  Trachea midline   CV:  RRR.  No m/r/g.  No jugular venous distension.  Lungs:   CTAB.  No wheezing, rhonchi, or rales.  Symmetric expansion.  Abdomen:   Bowel sounds present.  Soft, mild tender, nondistended.  Extremities: No cyanosis or clubbing.  No edema  Skin:     No rashes and normal coloration.   Warm and dry.    Neuro:  CN II-XII grossly intact.  Sensation intact.  A&Ox3  Psych:  Normal mood and affect.      I have personally reviewed labs and tests showing:  Recent Labs:  Recent  Results (from the past 24 hour(s))   CBC with Auto Differential    Collection Time: 01/12/22 12:22 AM   Result Value Ref Range    WBC 5.4 4.3 - 11.1 K/uL    RBC 2.35 (L) 4.05 - 5.2 M/uL    Hemoglobin 9.7 (L) 11.7 - 15.4 g/dL    Hematocrit 22.4 (L) 35.8 - 46.3 %    MCV 117.9 (H) 82 - 102 FL    MCH 41.3 (H) 26.1 - 32.9 PG    MCHC 35.0 31.4 - 35.0 g/dL    RDW 82.5 (H) 00.3 - 14.6 %    Platelets 402 150 - 450 K/uL    MPV 10.1 9.4 - 12.3 FL    nRBC 0.14 0.0 - 0.2 K/uL    Differential Type AUTOMATED      Seg Neutrophils 42 (L) 43 - 78 %    Lymphocytes 43 13 - 44 %    Monocytes 12 4.0 - 12.0 %    Eosinophils % 2 0.5 - 7.8 %    Basophils 1 0.0 - 2.0 %    Immature Granulocytes 0 0.0 - 5.0 %    Segs Absolute 2.3 1.7 - 8.2 K/UL    Absolute Lymph # 2.3 0.5 - 4.6 K/UL    Absolute Mono # 0.7 0.1 - 1.3 K/UL    Absolute Eos # 0.1 0.0 - 0.8 K/UL    Basophils Absolute 0.0 0.0 - 0.2 K/UL    Absolute Immature Granulocyte 0.0 0.0 - 0.5 K/UL   Basic Metabolic Panel    Collection Time: 01/12/22 12:22 AM   Result Value Ref Range    Sodium 141 133 - 143 mmol/L    Potassium 4.7 3.5 - 5.1 mmol/L    Chloride 112 (H) 101 - 110 mmol/L    CO2 24 21 - 32 mmol/L    Anion Gap 5 2 - 11 mmol/L    Glucose 89 65 - 100 mg/dL    BUN 10 6 - 23 MG/DL    Creatinine 7.04 (H) 0.6 - 1.0 MG/DL    Est, Glom Filt Rate 56 (L) >60 ml/min/1.39m2    Calcium 9.1 8.3 - 10.4 MG/DL       I have personally reviewed imaging studies showing:  No results found.    Echocardiogram:  No results found for this or any previous visit.        Orders Placed This Encounter   Medications    HYDROmorphone HCl PF (DILAUDID) injection 1 mg    0.9 % sodium chloride bolus    prochlorperazine (COMPAZINE) injection 10 mg    diphenhydrAMINE (BENADRYL) capsule 25 mg    HYDROmorphone HCl PF (DILAUDID) injection 1 mg    sodium chloride flush 0.9 % injection 5-40 mL    sodium chloride flush 0.9 % injection 5-40 mL    0.9 % sodium chloride infusion    OR Linked Order  Group     ondansetron  (ZOFRAN-ODT) disintegrating tablet 4 mg     ondansetron (ZOFRAN) injection 4 mg    polyethylene glycol (GLYCOLAX) packet 17 g    aluminum & magnesium hydroxide-simethicone (MAALOX) 200-200-20 MG/5ML suspension 30 mL    OR Linked Order Group     acetaminophen (TYLENOL) tablet 650 mg     acetaminophen (TYLENOL) suppository 650 mg    0.9 % sodium chloride infusion    HYDROcodone-acetaminophen (NORCO) 5-325 MG per tablet 1 tablet    diphenhydrAMINE (BENADRYL) capsule 25 mg    HYDROmorphone HCl PF (DILAUDID) injection 1 mg         Signed:  Lynne LoganHANH T Daffney Greenly, MD    Part of this note may have been written by using a voice dictation software.  The note has been proof read but may still contain some grammatical/other typographical errors.

## 2022-01-12 NOTE — Plan of Care (Signed)
Problem: Discharge Planning  Goal: Discharge to home or other facility with appropriate resources  Outcome: Progressing     Problem: Pain  Goal: Verbalizes/displays adequate comfort level or baseline comfort level  Outcome: Progressing     Problem: Safety - Adult  Goal: Free from fall injury  Outcome: Progressing     Problem: ABCDS Injury Assessment  Goal: Absence of physical injury  Outcome: Progressing

## 2022-01-12 NOTE — ED Triage Notes (Signed)
Ambulatory to triage. States sickle cell pain for a few weeks with no relief at home with medication. Denies fever, vomiting. States nausea. NAD. Denies CP or SHOB.

## 2022-01-12 NOTE — H&P (Signed)
[facility]        Inpatient Hematology / Oncology History and Physical    Reason for Admission:  Sickle cell crisis (HCC) [D57.00]  Sickle cell pain crisis (HCC) [D57.00]    History of Present Illness:  Ms. [name] Bright is a 46 y.o. female admitted on 01/12/2022. The encounter diagnosis was Sickle cell pain crisis (HCC).    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 She is on Hydrea 1000mg  BID, folic acid. She reports she takes norco at home infrequently. She was last seen in ED 12/26/21 with leg pain and has had some visits to ED for pain but [facility]     Ms [name] Bright presents on day of admission with pain typical of SS crisis pain. She reports she has a great deal of stress in her life as of recently. 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 She has also had several deaths in the family recently. She denies abdominal pain, dyspnea, shortness of breath, cough, urinary issues/pain. She was admitted for further management of sickle cell pain crisis.       Review of Systems:  Constitutional +diffuse pain. Denies fever, chills, weight loss, appetite changes, fatigue, night sweats.   HEENT Denies trauma, blurry vision, hearing loss, ear pain, nosebleeds, sore throat, neck pain and ear discharge.    Skin Denies lesions or rashes.   Lungs Denies dyspnea, cough, sputum production or hemoptysis.   Cardiovascular Denies chest pain, palpitations, or lower extremity edema.   Gastrointestinal Denies nausea, vomiting, changes in bowel habits, bloody or black stools, abdominal pain.   GU Denies dysuria, frequency or hesitancy of urination.   Neuro Denies headaches, visual changes or ataxia. Denies dizziness, tingling, tremors, sensory change, speech change, focal weakness or headaches.     Hematology Denies easy bruising or bleeding, denies gingival bleeding or epistaxis.   Endo  Denies heat/cold intolerance, denies diabetes or thyroid abnormalities.   MSK Denies back pain, arthralgias, myalgias or frequent falls.     Psychiatric/Behavioral Denies depression and substance abuse. The patient is not nervous/anxious.         Allergies   Allergen Reactions    Latex Rash    Ceftriaxone Other (See Comments)    Fentanyl Other (See Comments)     "my doctor said I had a stroke from it"    Influenza Vaccines Itching     unknown      Meperidine Other (See Comments)    Morphine Itching    Oxycodone Other (See Comments)     Past Medical History:   Diagnosis Date    Seizures (HCC)     Sickle cell disease (HCC)     Stroke (HCC)      Past Surgical History:   Procedure Laterality Date    CESAREAN SECTION      LAPAROTOMY      TUBAL LIGATION       Family History   Problem Relation Age of Onset    Diabetes Mother     Hypertension Mother     Diabetes Father     Hypertension Father      Social History     Socioeconomic History    Marital status: Single     Spouse name: Not on file    Number of children: Not on file    Years of education:  Not on file    Highest education level: Not on file   Occupational History    Not on file   Tobacco Use    Smoking status: Never    Smokeless tobacco: Never   Substance and Sexual Activity    Alcohol use: Never    Drug use: Never    Sexual activity: Not on file   Other Topics Concern    Not on file   Social History Narrative    Not on file     Social Determinants of Health     Financial Resource Strain: Not on file   Food Insecurity: Not on file   Transportation Needs: Not on file   Physical Activity: Not on file   Stress: Not on file   Social Connections: Not on file   Intimate Partner Violence: Not on file   Housing Stability: Not on file     Current Facility-Administered Medications   Medication Dose Route Frequency Provider Last Rate Last Admin    sodium chloride flush 0.9 % injection 5-40 mL  5-40 mL IntraVENous 2 times per day Lynne Logan, MD   10 mL at 01/12/22 0834     sodium chloride flush 0.9 % injection 5-40 mL  5-40 mL IntraVENous PRN Lynne Logan, MD   10 mL at 01/12/22 0832    0.9 % sodium chloride infusion   IntraVENous PRN Lynne Logan, MD        ondansetron (ZOFRAN-ODT) disintegrating tablet 4 mg  4 mg Oral Q8H PRN Lynne Logan, MD        Or    ondansetron [facility]) injection 4 mg  4 mg IntraVENous Q6H PRN Lynne Logan, MD        polyethylene glycol (GLYCOLAX) packet 17 g  17 g Oral Daily PRN Lynne Logan, MD        aluminum & magnesium hydroxide-simethicone (MAALOX) 200-200-20 MG/5ML suspension 30 mL  30 mL Oral Q6H PRN Lynne Logan, MD        acetaminophen (TYLENOL) tablet 650 mg  650 mg Oral Q6H PRN Lynne Logan, MD   650 mg at 01/12/22 1147    Or    acetaminophen (TYLENOL) suppository 650 mg  650 mg Rectal Q6H PRN Lynne Logan, MD        0.9 % sodium chloride infusion   IntraVENous Continuous Lynne Logan, MD 125 mL/hr at 01/12/22 0833 New Bag at 01/12/22 0833    HYDROcodone-acetaminophen (NORCO) 5-325 MG per tablet 1 tablet  1 tablet Oral Q6H PRN Lynne Logan, MD   1 tablet at 01/12/22 0825    diphenhydrAMINE (BENADRYL) capsule 25 mg  25 mg Oral Q6H PRN Lynne Logan, MD        HYDROmorphone HCl PF (DILAUDID) injection 1 mg  1 mg IntraVENous Q4H PRN Lynne Logan, MD   1 mg at 01/12/22 0918    FLUoxetine (PROZAC) capsule 40 mg  40 mg Oral Daily Lynne Logan, MD   40 mg at 01/12/22 0825    fluticasone (FLONASE) 50 MCG/ACT nasal spray 2 spray  2 spray Each Nostril Daily Lynne Logan, MD        folic acid (FOLVITE) tablet 1 mg  1 mg Oral Daily Lynne Logan, MD   1 mg at 01/12/22 0825    hydroxyurea (HYDREA) chemo capsule 1,000 mg  1,000 mg Oral Daily Lynne Logan, MD   1,000 mg at 01/12/22  1146    levETIRAcetam (KEPPRA) tablet 1,500 mg  1,500 mg Oral BID Lynne Logan, MD   1,500 mg at 01/12/22 8325    rivaroxaban (XARELTO) tablet 20 mg  20 mg Oral Daily with breakfast Lynne Logan, MD   20 mg at 01/12/22 0824       OBJECTIVE:  Patient Vitals for the past 8 hrs:   BP Temp  Temp src Pulse Resp SpO2   01/12/22 1403 (!) 148/80 98.1 ??F (36.7 ??C) Oral 93 18 97 %   01/12/22 1145 125/69 -- -- -- -- 90 %   01/12/22 1130 (!) 142/83 -- -- -- -- 94 %   01/12/22 1115 108/74 -- -- -- -- 92 %   01/12/22 1100 110/69 -- -- -- -- 91 %   01/12/22 1045 117/73 -- -- -- -- 92 %   01/12/22 1030 119/69 -- -- -- -- 94 %   01/12/22 0945 118/87 -- -- -- -- 94 %   01/12/22 0930 117/80 -- -- -- -- 94 %   01/12/22 0830 -- -- -- -- -- 100 %   01/12/22 0745 117/67 -- -- -- -- 97 %   01/12/22 0730 102/63 -- -- -- -- 99 %   01/12/22 0715 98/82 -- -- -- -- 100 %   01/12/22 0700 105/77 -- -- -- -- 99 %     Temp (24hrs), Avg:98.4 ??F (36.9 ??C), Min:98.1 ??F (36.7 ??C), Max:98.6 ??F (37 ??C)    No intake/output data recorded.    Physical Exam:  Constitutional: Well developed, well nourished female in no acute distress, sitting comfortably in the hospital bed.    HEENT: Normocephalic and atraumatic. Oropharynx is clear, mucous membranes are moist.  Extraocular muscles are intact.  Sclerae anicteric. Neck supple without JVD. No thyromegaly present.    Skin Warm and dry.  No bruising and no rash noted.  No erythema.  No pallor.    Respiratory Lungs are clear to auscultation bilaterally without wheezes, rales or rhonchi, normal air exchange without accessory muscle use.    CVS Normal rate, regular rhythm and normal S1 and S2.  No murmurs, gallops, or rubs.   Abdomen Soft, nontender and nondistended, normoactive bowel sounds.  No palpable mass.  No hepatosplenomegaly.   Neuro Grossly nonfocal with no obvious sensory or motor deficits.   MSK Normal range of motion in general.  No edema and no tenderness.   Psych Appropriate mood and affect.        Labs:    Recent Results (from the past 24 hour(s))   CBC with Auto Differential    Collection Time: 01/12/22 12:22 AM   Result Value Ref Range    WBC 5.4 4.3 - 11.1 K/uL    RBC 2.35 (L) 4.05 - 5.2 M/uL    Hemoglobin 9.7 (L) 11.7 - 15.4 g/dL    Hematocrit 49.8 (L) 35.8 - 46.3 %    MCV  117.9 (H) 82 - 102 FL    MCH 41.3 (H) 26.1 - 32.9 PG    MCHC 35.0 31.4 - 35.0 g/dL    RDW 26.4 (H) 15.8 - 14.6 %    Platelets 402 150 - 450 K/uL    MPV 10.1 9.4 - 12.3 FL    nRBC 0.14 0.0 - 0.2 K/uL    Differential Type AUTOMATED      Seg Neutrophils 42 (L) 43 - 78 %    Lymphocytes 43 13 - 44 %    Monocytes 12  4.0 - 12.0 %    Eosinophils % 2 0.5 - 7.8 %    Basophils 1 0.0 - 2.0 %    Immature Granulocytes 0 0.0 - 5.0 %    Segs Absolute 2.3 1.7 - 8.2 K/UL    Absolute Lymph # 2.3 0.5 - 4.6 K/UL    Absolute Mono # 0.7 0.1 - 1.3 K/UL    Absolute Eos # 0.1 0.0 - 0.8 K/UL    Basophils Absolute 0.0 0.0 - 0.2 K/UL    Absolute Immature Granulocyte 0.0 0.0 - 0.5 K/UL   Basic Metabolic Panel    Collection Time: 01/12/22 12:22 AM   Result Value Ref Range    Sodium 141 133 - 143 mmol/L    Potassium 4.7 3.5 - 5.1 mmol/L    Chloride 112 (H) 101 - 110 mmol/L    CO2 24 21 - 32 mmol/L    Anion Gap 5 2 - 11 mmol/L    Glucose 89 65 - 100 mg/dL    BUN 10 6 - 23 MG/DL    Creatinine 6.28 (H) 0.6 - 1.0 MG/DL    Est, Glom Filt Rate 56 (L) >60 ml/min/1.61m2    Calcium 9.1 8.3 - 10.4 MG/DL       Imaging:  None    ASSESSMENT:    ICD-10-CM    1. Sickle cell pain crisis (HCC)  D57.00           PLAN:    Sickle cell pain crisis / HbSS  - Continue Hydrea, folic acid  - Continue aggressive IVF  - Continue pain management - IV dilaudid 1mg  every 3 hrs and norco 5mg  every 6 hrs PRN. Benadryl PO only  - Daily retic  - No other associated symptoms and on RA.     Elevated Cr  - Cr 1.2 (most recent baseline 1.0-1.1)  - Continue IVF    Hx of DVT  - Continue Xarelto    Hx of seizures  - Continue Keppra    Intertriginous candida  - Nystatin powder    Cotninue home meds  [place] SOPs  VTE prophylaxis - on Xarelto    Dispo - Anticipate DC home when pain controlled. FU with Dr in office within 1 week of DC.    Goals and plan of care reviewed with the patient.  All questions answered to the best of our ability. We will gladly assume care of our patient.                , APRN - CNP   [facility]  [address]  [place] [address]  Office : [phone]  Fax : [phone]    I personally saw, exammed and counselled the patient, and discussed with NP, agree with above history/assessment/plan. 46 y.o.female admitted for sickle cell crisis, reports very stressed taking care of her sick mother, 1 family member got shot in the stomach lately and another 1 with shot died earlier, also with sinusitis, arrange IV fluid, oxygen, Hydrea, folic acid, Xarelto, Keppra, antibiotics for sinusitis.      [phone], M.D.  [address] [facility]  [address]  [place], [address] [place]  Office : [phone]  Fax : [phone]

## 2022-01-12 NOTE — Consults (Signed)
See today's oncology H&P.    Kate M Niccole Witthuhn, APRN - CNP

## 2022-01-13 LAB — CBC WITH AUTO DIFFERENTIAL
Absolute Eos #: 0.3 10*3/uL (ref 0.0–0.8)
Absolute Immature Granulocyte: 0 10*3/uL (ref 0.0–0.5)
Absolute Lymph #: 2.1 10*3/uL (ref 0.5–4.6)
Absolute Mono #: 0.7 10*3/uL (ref 0.1–1.3)
Basophils Absolute: 0 10*3/uL (ref 0.0–0.2)
Basophils: 0 % (ref 0.0–2.0)
Eosinophils %: 5 % (ref 0.5–7.8)
Hematocrit: 22.3 % — ABNORMAL LOW (ref 35.8–46.3)
Hemoglobin: 7.8 g/dL — ABNORMAL LOW (ref 11.7–15.4)
Immature Granulocytes: 0 % (ref 0.0–5.0)
Lymphocytes: 39 % (ref 13–44)
MCH: 41.1 PG — ABNORMAL HIGH (ref 26.1–32.9)
MCHC: 35 g/dL (ref 31.4–35.0)
MCV: 117.4 FL — ABNORMAL HIGH (ref 82–102)
MPV: 9.6 FL (ref 9.4–12.3)
Monocytes: 13 % — ABNORMAL HIGH (ref 4.0–12.0)
Platelets: 292 10*3/uL (ref 150–450)
RBC: 1.9 M/uL — ABNORMAL LOW (ref 4.05–5.2)
RDW: 15.5 % — ABNORMAL HIGH (ref 11.9–14.6)
Seg Neutrophils: 43 % (ref 43–78)
Segs Absolute: 2.3 10*3/uL (ref 1.7–8.2)
WBC: 5.3 10*3/uL (ref 4.3–11.1)
nRBC: 0.15 10*3/uL (ref 0.0–0.2)

## 2022-01-13 LAB — COMPREHENSIVE METABOLIC PANEL
ALT: 21 U/L (ref 12–65)
AST: 21 U/L (ref 15–37)
Albumin/Globulin Ratio: 1 (ref 0.4–1.6)
Albumin: 3.5 g/dL (ref 3.5–5.0)
Alk Phosphatase: 82 U/L (ref 50–136)
Anion Gap: 7 mmol/L (ref 2–11)
BUN: 8 MG/DL (ref 6–23)
CO2: 22 mmol/L (ref 21–32)
Calcium: 8.7 MG/DL (ref 8.3–10.4)
Chloride: 110 mmol/L (ref 101–110)
Creatinine: 0.8 MG/DL (ref 0.6–1.0)
Est, Glom Filt Rate: >60 mL/min/{1.73_m2} (ref >60)
Globulin: 3.6 g/dL (ref 2.8–4.5)
Glucose: 110 mg/dL — ABNORMAL HIGH (ref 65–100)
Potassium: 4 mmol/L (ref 3.5–5.1)
Sodium: 139 mmol/L (ref 133–143)
Total Bilirubin: 1.2 MG/DL — ABNORMAL HIGH (ref 0.2–1.1)
Total Protein: 7.1 g/dL (ref 6.3–8.2)

## 2022-01-13 LAB — RETICULOCYTES
Absolute Retic #: 0.1246 M/ul — ABNORMAL HIGH (ref 0.026–0.095)
Immature Retic Fraction: 27.2 % — ABNORMAL HIGH (ref 3.0–15.9)
Retic Hemoglobin conc.: 32 pg (ref 29–35)
Reticulocyte Count,Automated: 6.6 % — ABNORMAL HIGH (ref 0.3–2.0)

## 2022-01-13 LAB — MAGNESIUM: Magnesium: 2.1 mg/dL (ref 1.8–2.4)

## 2022-01-13 MED ORDER — LEVOFLOXACIN 500 MG PO TABS
500 MG | Freq: Every day | ORAL | Status: AC
Start: 2022-01-13 — End: 2022-01-18
  Administered 2022-01-13 – 2022-01-17 (×5): 500 mg via ORAL

## 2022-01-13 MED FILL — LEVOFLOXACIN 500 MG PO TABS: 500 MG | ORAL | Qty: 1

## 2022-01-13 MED FILL — DILAUDID 1 MG/ML IJ SOLN: 1 MG/ML | INTRAMUSCULAR | Qty: 1

## 2022-01-13 MED FILL — LEVETIRACETAM 500 MG PO TABS: 500 MG | ORAL | Qty: 3

## 2022-01-13 MED FILL — DIPHENHYDRAMINE HCL 25 MG PO CAPS: 25 MG | ORAL | Qty: 1

## 2022-01-13 MED FILL — HYDROXYUREA 500 MG PO CAPS: 500 MG | ORAL | Qty: 2

## 2022-01-13 MED FILL — FLUOXETINE HCL 20 MG PO CAPS: 20 MG | ORAL | Qty: 2

## 2022-01-13 MED FILL — HYDROCODONE-ACETAMINOPHEN 5-325 MG PO TABS: 5-325 MG | ORAL | Qty: 1

## 2022-01-13 MED FILL — FLUTICASONE PROPIONATE 50 MCG/ACT NA SUSP: 50 MCG/ACT | NASAL | Qty: 16

## 2022-01-13 MED FILL — FOLIC ACID 1 MG PO TABS: 1 MG | ORAL | Qty: 1

## 2022-01-13 MED FILL — XARELTO 20 MG PO TABS: 20 MG | ORAL | Qty: 1

## 2022-01-13 MED FILL — ACETAMINOPHEN 325 MG PO TABS: 325 MG | ORAL | Qty: 2

## 2022-01-13 NOTE — Plan of Care (Signed)
Problem: Discharge Planning  Goal: Discharge to home or other facility with appropriate resources  01/13/2022 0800 by Wilber Oliphant, RN  Outcome: Progressing  01/12/2022 2249 by Junius Roads, RN  Outcome: Progressing     Problem: Pain  Goal: Verbalizes/displays adequate comfort level or baseline comfort level  01/13/2022 0800 by Wilber Oliphant, RN  Outcome: Progressing  01/12/2022 2249 by Junius Roads, RN  Outcome: Progressing     Problem: Safety - Adult  Goal: Free from fall injury  01/13/2022 0800 by Wilber Oliphant, RN  Outcome: Progressing  01/12/2022 2249 by Junius Roads, RN  Outcome: Progressing     Problem: ABCDS Injury Assessment  Goal: Absence of physical injury  01/13/2022 0800 by Wilber Oliphant, RN  Outcome: Progressing  01/12/2022 2249 by Junius Roads, RN  Outcome: Progressing

## 2022-01-13 NOTE — Progress Notes (Signed)
[facility]        Inpatient Hematology / Oncology Progress Note    Reason for Admission:  Sickle cell crisis ([place]) [D57.00]  Sickle cell pain crisis ([place]) [D57.00]    24 Hour Events:  VSS, Afebrile  Retic 6.6  Hgb 7.8    Prn Dilaudid IV for pain   Start LVQ for possible sinusitis       ROS:  Constitutional: Positive for fatigue; negative for fever, chills  CV: Negative for chest pain, palpitations, edema.  Respiratory: Negative for dyspnea, cough, wheezing.  GI: Negative for nausea, abdominal pain, diarrhea.    10 point review of systems is otherwise negative with the exception of the elements mentioned above in the HPI.       Allergies   Allergen Reactions    Latex Rash    Ceftriaxone Other (See Comments)    Fentanyl Other (See Comments)     "my doctor said I had a stroke from it"    Influenza Vaccines Itching     unknown      Meperidine Other (See Comments)    Morphine Itching    Oxycodone Other (See Comments)     Past Medical History:   Diagnosis Date    Seizures ([place])     Sickle cell disease ([place])     Stroke ([place])      Past Surgical History:   Procedure Laterality Date    CESAREAN SECTION      LAPAROTOMY      TUBAL LIGATION       Family History   Problem Relation Age of Onset    Diabetes Mother     Hypertension Mother     Diabetes Father     Hypertension Father      Social History     Socioeconomic History    Marital status: Single     Spouse name: Not on file    Number of children: Not on file    Years of education: Not on file    Highest education level: Not on file   Occupational History    Not on file   Tobacco Use    Smoking status: Never    Smokeless tobacco: Never   Substance and Sexual Activity    Alcohol use: Never    Drug use: Never    Sexual activity: Not on file   Other Topics Concern    Not on file   Social History Narrative    Not on file     Social Determinants of Health     Financial Resource Strain: Not on file   Food Insecurity: Not on file   Transportation Needs: Not  on file   Physical Activity: Not on file   Stress: Not on file   Social Connections: Not on file   Intimate Partner Violence: Not on file   Housing Stability: Not on file     Current Facility-Administered Medications   Medication Dose Route Frequency Provider Last Rate Last Admin    sodium chloride flush 0.9 % injection 5-40 mL  5-40 mL IntraVENous 2 times per day Milagros Reap, MD   10 mL at 01/12/22 2058    sodium chloride flush 0.9 % injection 5-40 mL  5-40 mL IntraVENous PRN Milagros Reap, MD   10 mL at 01/12/22 0832    0.9 % sodium chloride infusion   IntraVENous PRN Milagros Reap, MD        ondansetron (ZOFRAN-ODT) disintegrating  tablet 4 mg  4 mg Oral Q8H PRN Milagros Reap, MD        Or    ondansetron [facility]) injection 4 mg  4 mg IntraVENous Q6H PRN Milagros Reap, MD        polyethylene glycol (GLYCOLAX) packet 17 g  17 g Oral Daily PRN Milagros Reap, MD        aluminum & magnesium hydroxide-simethicone (MAALOX) 200-200-20 MG/5ML suspension 30 mL  30 mL Oral Q6H PRN Milagros Reap, MD        acetaminophen (TYLENOL) tablet 650 mg  650 mg Oral Q6H PRN Milagros Reap, MD   650 mg at 01/12/22 1147    HYDROcodone-acetaminophen (NORCO) 5-325 MG per tablet 1 tablet  1 tablet Oral Q6H PRN Milagros Reap, MD   1 tablet at 01/12/22 2142    diphenhydrAMINE (BENADRYL) capsule 25 mg  25 mg Oral Q6H PRN Milagros Reap, MD   25 mg at 01/12/22 2351    FLUoxetine (PROZAC) capsule 40 mg  40 mg Oral Daily Milagros Reap, MD   40 mg at 01/13/22 0744    fluticasone (FLONASE) 50 MCG/ACT nasal spray 2 spray  2 spray Each Nostril Daily Milagros Reap, MD   2 spray at 93/71/69 6789    folic acid (FOLVITE) tablet 1 mg  1 mg Oral Daily Milagros Reap, MD   1 mg at 01/13/22 0744    hydroxyurea (HYDREA) chemo capsule 1,000 mg  1,000 mg Oral Daily Milagros Reap, MD   1,000 mg at 01/12/22 1146    levETIRAcetam (KEPPRA) tablet 1,500 mg  1,500 mg Oral BID Milagros Reap, MD   1,500 mg at 01/13/22 0744    rivaroxaban (XARELTO) tablet 20 mg  20 mg Oral Daily with  breakfast Milagros Reap, MD   20 mg at 01/13/22 0744    miconazole (MICOTIN) 2 % powder   Topical BID Elmer Ramp, MD   Given at 01/13/22 0750    dextrose 5 % and 0.45 % sodium chloride infusion   IntraVENous Continuous Sheppard Penton, APRN - CNP 125 mL/hr at 01/13/22 0742 New Bag at 01/13/22 0742    HYDROmorphone HCl PF (DILAUDID) injection 1 mg  1 mg IntraVENous Q3H PRN Sheppard Penton, APRN - CNP   1 mg at 01/13/22 0739       OBJECTIVE:  Patient Vitals for the past 8 hrs:   BP Temp Temp src Pulse Resp SpO2   01/13/22 0809 -- -- -- -- 18 --   01/13/22 0747 115/73 98.6 ??F (37 ??C) Oral 84 18 --   01/13/22 0429 -- -- -- -- 16 --   01/13/22 0420 116/80 98.1 ??F (36.7 ??C) Oral 91 18 91 %   01/13/22 0359 -- -- -- -- 18 --     Temp (24hrs), Avg:98.1 ??F (36.7 ??C), Min:97.9 ??F (36.6 ??C), Max:98.6 ??F (37 ??C)    02/10 0701 - 02/10 1900  In: 118 [P.O.:118]  Out: -     Physical Exam:  Constitutional: Well developed, well nourished Bright in no acute distress, sitting comfortably in the hospital bed.    HEENT: Normocephalic and atraumatic. Oropharynx is clear, mucous membranes are moist.  Extraocular muscles are intact.  Sclerae anicteric. Neck supple without JVD. No thyromegaly present.    Skin Warm and dry.  No bruising and no rash noted.  No erythema.  No pallor.    Respiratory Lungs are clear  to auscultation bilaterally without wheezes, rales or rhonchi, normal air exchange without accessory muscle use.    CVS Normal rate, regular rhythm and normal S1 and S2.  No murmurs, gallops, or rubs.   Abdomen Soft, nontender and nondistended, normoactive bowel sounds.  No palpable mass.  No hepatosplenomegaly.   Neuro Grossly nonfocal with no obvious sensory or motor deficits.   MSK Normal range of motion in general.  No edema and no tenderness.   Psych Appropriate mood and affect.        Labs:    Recent Results (from the past 24 hour(s))   Reticulocytes    Collection Time: 01/12/22  2:54 PM   Result Value Ref Range     Reticulocyte Count,Automated 6.3 (H) 0.3 - 2.0 %    Absolute Retic # 0.1223 (H) 0.026 - 0.095 M/ul    Immature Retic Fraction 24.8 (H) 3.0 - 15.9 %    Retic Hemoglobin conc. 35 29 - 35 pg   Urinalysis w rflx microscopic    Collection Time: 01/12/22  5:58 PM   Result Value Ref Range    Color, UA YELLOW/STRAW      Appearance CLEAR      Specific Gravity, UA 1.011 1.001 - 1.023      pH, Urine 6.0 5.0 - 9.0      Protein, UA Negative NEG mg/dL    Glucose, UA Negative mg/dL    Ketones, Urine Negative NEG mg/dL    Bilirubin Urine Negative NEG      Blood, Urine MODERATE (A) NEG      Urobilinogen, Urine 1.0 0.2 - 1.0 EU/dL    Nitrite, Urine Negative NEG      Leukocyte Esterase, Urine Negative NEG      WBC, UA 0-4 U4 /hpf    RBC, UA 5-10 (A) U5 /hpf    Epithelial Cells UA 0-5 U5 /hpf    BACTERIA, URINE Negative NEG /hpf    Casts 0-2 U2 /lpf   Reticulocytes    Collection Time: 01/13/22  7:08 AM   Result Value Ref Range    Reticulocyte Count,Automated 6.6 (H) 0.3 - 2.0 %    Absolute Retic # 0.1246 (H) 0.026 - 0.095 M/ul    Immature Retic Fraction 27.2 (H) 3.0 - 15.9 %    Retic Hemoglobin conc. 32 29 - 35 pg   CBC with Auto Differential    Collection Time: 01/13/22  7:08 AM   Result Value Ref Range    WBC 5.3 4.3 - 11.1 K/uL    RBC 1.90 (L) 4.05 - 5.2 M/uL    Hemoglobin 7.8 (L) 11.7 - 15.4 g/dL    Hematocrit 22.3 (L) 35.8 - 46.3 %    MCV 117.4 (H) 82 - 102 FL    MCH 41.1 (H) 26.1 - 32.9 PG    MCHC 35.0 31.4 - 35.0 g/dL    RDW 15.5 (H) 11.9 - 14.6 %    Platelets 292 150 - 450 K/uL    MPV 9.6 9.4 - 12.3 FL    nRBC 0.15 0.0 - 0.2 K/uL    Differential Type AUTOMATED      Seg Neutrophils 43 43 - 78 %    Lymphocytes 39 13 - 44 %    Monocytes 13 (H) 4.0 - 12.0 %    Eosinophils % 5 0.5 - 7.8 %    Basophils 0 0.0 - 2.0 %    Immature Granulocytes 0 0.0 - 5.0 %    Segs Absolute  2.3 1.7 - 8.2 K/UL    Absolute Lymph # 2.1 0.5 - 4.6 K/UL    Absolute Mono # 0.7 0.1 - 1.3 K/UL    Absolute Eos # 0.3 0.0 - 0.8 K/UL    Basophils Absolute 0.0 0.0 -  0.2 K/UL    Absolute Immature Granulocyte 0.0 0.0 - 0.5 K/UL   Comprehensive Metabolic Panel    Collection Time: 01/13/22  7:08 AM   Result Value Ref Range    Sodium 139 133 - 143 mmol/L    Potassium 4.0 3.5 - 5.1 mmol/L    Chloride 110 101 - 110 mmol/L    CO2 22 21 - 32 mmol/L    Anion Gap 7 2 - 11 mmol/L    Glucose 110 (H) 65 - 100 mg/dL    BUN 8 6 - 23 MG/DL    Creatinine 0.80 0.6 - 1.0 MG/DL    Est, Glom Filt Rate >60 >60 ml/min/1.6m    Calcium 8.7 8.3 - 10.4 MG/DL    Total Bilirubin 1.2 (H) 0.2 - 1.1 MG/DL    ALT 21 12 - 65 U/L    AST 21 15 - 37 U/L    Alk Phosphatase 82 50 - 136 U/L    Total Protein 7.1 6.3 - 8.2 g/dL    Albumin 3.5 3.5 - 5.0 g/dL    Globulin 3.6 2.8 - 4.5 g/dL    Albumin/Globulin Ratio 1.0 0.4 - 1.6     Magnesium    Collection Time: 01/13/22  7:08 AM   Result Value Ref Range    Magnesium 2.1 1.8 - 2.4 mg/dL       Imaging:  None    ASSESSMENT:  Patient Active Problem List   Diagnosis    Hypercoagulable state ([place]    Seizure disorder (HCC)    Sickle cell anemia with crisis (HCC)    Sickle cell disease (HCC)    CAP (community acquired pneumonia)    Acute chest wall pain    Atopic rhinitis    Gastroesophageal reflux disease    Depression    Superior vena cava occlusion    History of DVT (deep vein thrombosis)    Menorrhagia    Abnormal uterine bleeding    Symptomatic anemia    Sickle cell pain crisis ([facility]     [name]. MOliveriais a 46y.o. Bright admitted on 01/12/2022. The encounter diagnosis was Sickle cell pain crisis ([place].     [name] Bright a PMH of seizures, DVT, PE, splenic infarct, SVC thrombosis. She is a patient of Dr KHumphrey Rollswith Hb-SS. She is on Hydrea [id number], folic acid. She reports she takes norco at home infrequently. She was last seen in ED 12/26/21 with leg pain and has had some visits to ED for pain but [facility]      [name] MKintzelpresents on day of admission with pain typical of SS crisis pain. She reports she has a great deal of stress in her life as  of recently. 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 She has also had several deaths in the family recently. She denies abdominal pain, dyspnea, shortness of breath, cough, urinary issues/pain. She was admitted for further management of sickle cell pain crisis.        PLAN:  Sickle cell pain crisis / HbSS  - Continue Hydrea, folic acid  - Continue aggressive IVF  - Continue pain management - IV dilaudid  39m every 3 hrs and norco 559mevery 6 hrs PRN. Benadryl PO only  - Daily retic  - No other associated symptoms and on RA.   2/10 Retic 6.6.       Elevated Cr  - Cr 1.2 (most recent baseline 1.0-1.1)  - Continue IVF  2/10 Cr down to 0.8     Hx of DVT  - Continue Xarelto     Hx of seizures  - Continue Keppra     Intertriginous candida  - Nystatin powder     ? Sinusitis with drainage  2/10 Start LVQ    Cotninue home meds  [place] SOPs  VTE prophylaxis - on Xarelto     Dispo - Anticipate DC home when pain controlled. FU with Dr KhHumphrey Rollsn office within 1 week of DC.     Goals and plan of care reviewed with the patient.  All questions answered to the best of our ability. We will gladly assume care of our patient.               SaKerby NoraAPRN - NP   [facility]  [id number] [id number] : [phone] : [phone]  I personally saw, exammed and counselled the patient, and discussed with NP, agree with above history/assessment/plan. [age].o.Bright admitted for sickle cell crisis, reports very stressed taking care of her sick mother, 1 family member got shot in the stomach lately and another 1 was shot died earlier, also with sinusitis, arrange IV fluid, oxygen, Hydrea, folic acid, Xarelto, Keppra, antibiotics for sinusitis, labs reviewed and continue above care, start Levaquin for sinusitis and coughing.     AlDellia CloudM.D.  [place] [id number] : [phone] :  [phone]

## 2022-01-13 NOTE — Care Coordination-Inpatient (Signed)
ASSESSMENT NOTE    Attending Physician: Shelbie Proctor, MD  Admit Problem: Sickle cell crisis (HCC) [D57.00]  Sickle cell pain crisis (HCC) [D57.00]  Date/Time of Admission: 01/12/2022 12:05 AM  Problem List:  Patient Active Problem List   Diagnosis    Hypercoagulable state (HCC)    Seizure disorder (HCC)    Sickle cell anemia with crisis (HCC)    Sickle cell disease (HCC)    CAP (community acquired pneumonia)    Acute chest wall pain    Atopic rhinitis    Gastroesophageal reflux disease    Depression    Superior vena cava occlusion    History of DVT (deep vein thrombosis)    Menorrhagia    Abnormal uterine bleeding    Symptomatic anemia    Sickle cell pain crisis [facility])       Service Assessment  Patient Orientation Alert and Oriented   Cognition Alert   History Provided By Patient   Primary Caregiver Self   Accompanied By/Relationship     Support Systems Parent, Family Members   Patient's Healthcare Decision Maker is: Legal Next of Kin (mother: serafina topham  [phone])   PCP Verified by CM Yes (Dr Lupita Raider  [phone] and uses [facility] in [place])   Last Visit to PCP Within last 6 months   Prior Functional Level Independent in ADLs/IADLs   Current Functional Level Independent in ADLs/IADLs   Can patient return to prior living arrangement Yes   Ability to make needs known: Good   Family able to assist with home care needs: Yes   Would you like for me to discuss the discharge plan with any other family members/significant others, and if so, who? Yes (family)   Chief Executive Officer, Medicaid ([employer])   Programmer, applications     CM/SW Referral       Social/Functional History  Lives With Family   Type of Home House   Home Layout     Home Access     Entrance Stairs - Number of Steps     Entrance Stairs - Rails     Bathroom Shower/Tub     Bathroom Armed forces logistics/support/administrative officer     Home Equipment     Receives Help From Family   ADL Assistance  Independent   Government social research officer     Grooming     Feeding     Toileting     Homemaking Assistance     Meal Prep     Laundry     Vacuuming     Nature conservation officer Work     Management consultant     Child Care     Other (Comment)     Homemaking Responsibilities     Meal Prep Nurse, learning disability Responsibility     Dependent Care Responsibility     Health Care Management     Other (Comment)     Ambulation Assistance     Transfer Assistance     Active Driver Yes   Patient's Driver Info     Mode of Engineer, water   Education     Occupation On disability   Type of Occupation       Discharge Planning  Type of Residence House   Living Arrangements Family Members   Support Systems Parent, Family Members   Current Services Prior To Admission None   Potential Assistance Needed N/A   DME     DME     DME Ordered? No   Potential Assistance Purchasing Medications No   Meds-to-Beds: Does the patient want to have any new prescriptions delivered to bedside prior to discharge?     Type of Home Care Services None   Patient expects to be discharged to: House   Follow Up Appointment: Best Day/Time     One/Two Story Residence:     # of Interior Steps     Height of Each Step (in)     [employer] Available     History of Falls?       Services At/After Discharge  Transition of Care Consult (CM Consult): Discharge Planning   Internal Home Health     Internal Hospice     Reason Outside Agency Chosen     Partner SNF     Reason Why Partner SNF Not Chosen     Internal Comfort Care     Reason Outside Comfort Care Chosen     Services At/After Discharge None   Veteran Resource Information Provided? No   Mode of Transport at Discharge Self   Hospital Transport Time of Discharge     Confirm Follow Up Transport Self     Condition of Participation: Discharge Planning  The plan for Transition of Care is related to the  following treatment goals: return to baseline with family support   The Patient and/or Patient Representative was provided with a Choice of Provider? Patient   Name of the Patient Representative who was provided with the Choice of Provider and agrees with the Discharge Plan?     The Patient and/or Patient Representative Agree with the Discharge Plan? Yes   Freedom of Choice list was provided with basic dialogue that supports the individualized plan of care/goals, treatment preferences, and shares the quality data associated with the providers? Yes     Documentation for Discharge Appeal  Discharge Appealed by     Date notified by QIO of appeal request:     Time notified by QIO of appeal request:     Detailed Notice of Discharge given to:     Date Notice of Discharge given:     Time Notice of Discharge given:     Date records sent to QIO     Time records sent to QIO     Date Notified of Outcome     Time Notified of Outcome     Outcome of appeal           Darliss Cheney, RN 01/13/22 1:33 PM

## 2022-01-14 ENCOUNTER — Inpatient Hospital Stay: Admit: 2022-01-15 | Payer: MEDICARE | Primary: Student in an Organized Health Care Education/Training Program

## 2022-01-14 LAB — CBC WITH AUTO DIFFERENTIAL
Absolute Eos #: 0.5 10*3/uL (ref 0.0–0.8)
Absolute Immature Granulocyte: 0 10*3/uL (ref 0.0–0.5)
Absolute Lymph #: 1.7 10*3/uL (ref 0.5–4.6)
Absolute Mono #: 0.7 10*3/uL (ref 0.1–1.3)
Basophils Absolute: 0 10*3/uL (ref 0.0–0.2)
Basophils: 0 % (ref 0.0–2.0)
Eosinophils %: 8 % — ABNORMAL HIGH (ref 0.5–7.8)
Hematocrit: 22 % — ABNORMAL LOW (ref 35.8–46.3)
Hemoglobin: 7.9 g/dL — ABNORMAL LOW (ref 11.7–15.4)
Immature Granulocytes: 0 % (ref 0.0–5.0)
Lymphocytes: 30 % (ref 13–44)
MCH: 41.6 PG — ABNORMAL HIGH (ref 26.1–32.9)
MCHC: 35.9 g/dL — ABNORMAL HIGH (ref 31.4–35.0)
MCV: 115.8 FL — ABNORMAL HIGH (ref 82–102)
MPV: 9.6 FL (ref 9.4–12.3)
Monocytes: 13 % — ABNORMAL HIGH (ref 4.0–12.0)
Platelets: 307 10*3/uL (ref 150–450)
RBC: 1.9 M/uL — ABNORMAL LOW (ref 4.05–5.2)
RDW: 16.5 % — ABNORMAL HIGH (ref 11.9–14.6)
Seg Neutrophils: 48 % (ref 43–78)
Segs Absolute: 2.7 10*3/uL (ref 1.7–8.2)
WBC: 5.6 10*3/uL (ref 4.3–11.1)
nRBC: 0.27 10*3/uL — ABNORMAL HIGH (ref 0.0–0.2)

## 2022-01-14 LAB — RETICULOCYTES
Absolute Retic #: 0.137 M/ul — ABNORMAL HIGH (ref 0.026–0.095)
Immature Retic Fraction: 30.9 % — ABNORMAL HIGH (ref 3.0–15.9)
Retic Hemoglobin conc.: 34 pg (ref 29–35)
Reticulocyte Count,Automated: 7.2 % — ABNORMAL HIGH (ref 0.3–2.0)

## 2022-01-14 LAB — MAGNESIUM: Magnesium: 1.9 mg/dL (ref 1.8–2.4)

## 2022-01-14 LAB — COMPREHENSIVE METABOLIC PANEL
ALT: 20 U/L (ref 12–65)
AST: 26 U/L (ref 15–37)
Albumin/Globulin Ratio: 0.9 (ref 0.4–1.6)
Albumin: 3.4 g/dL — ABNORMAL LOW (ref 3.5–5.0)
Alk Phosphatase: 84 U/L (ref 50–136)
Anion Gap: 4 mmol/L (ref 2–11)
BUN: 6 MG/DL (ref 6–23)
CO2: 24 mmol/L (ref 21–32)
Calcium: 9 MG/DL (ref 8.3–10.4)
Chloride: 111 mmol/L — ABNORMAL HIGH (ref 101–110)
Creatinine: 0.8 MG/DL (ref 0.6–1.0)
Est, Glom Filt Rate: >60 mL/min/{1.73_m2} (ref >60)
Globulin: 4 g/dL (ref 2.8–4.5)
Glucose: 112 mg/dL — ABNORMAL HIGH (ref 65–100)
Potassium: 4 mmol/L (ref 3.5–5.1)
Sodium: 139 mmol/L (ref 133–143)
Total Bilirubin: 1.6 MG/DL — ABNORMAL HIGH (ref 0.2–1.1)
Total Protein: 7.4 g/dL (ref 6.3–8.2)

## 2022-01-14 MED FILL — XARELTO 20 MG PO TABS: 20 MG | ORAL | Qty: 1

## 2022-01-14 MED FILL — FOLIC ACID 1 MG PO TABS: 1 MG | ORAL | Qty: 1

## 2022-01-14 MED FILL — LEVETIRACETAM 500 MG PO TABS: 500 MG | ORAL | Qty: 3

## 2022-01-14 MED FILL — DILAUDID 1 MG/ML IJ SOLN: 1 MG/ML | INTRAMUSCULAR | Qty: 1

## 2022-01-14 MED FILL — DIPHENHYDRAMINE HCL 25 MG PO CAPS: 25 MG | ORAL | Qty: 1

## 2022-01-14 MED FILL — HYDROXYUREA 500 MG PO CAPS: 500 MG | ORAL | Qty: 2

## 2022-01-14 MED FILL — FLUOXETINE HCL 20 MG PO CAPS: 20 MG | ORAL | Qty: 2

## 2022-01-14 MED FILL — LEVOFLOXACIN 500 MG PO TABS: 500 MG | ORAL | Qty: 1

## 2022-01-14 NOTE — Progress Notes (Signed)
Noted pt has a lot of stress in her life recently with social issues    Will explore as care continues

## 2022-01-14 NOTE — Progress Notes (Signed)
[facility]        Inpatient Hematology / Oncology Progress Note    Reason for Admission:  Sickle cell crisis ([place]) [D57.00]  Sickle cell pain crisis (H[facility]) [D57.00]    24 Hour Events:  VSS, Afebrile  Retic 7.2 (6.6)  Hgb 7.9 (7.8)  TB 1.6 (1.2)    Prn Dilaudid IV for pain   LVQ for sinusitis   Headache better today  C/o right neck swelling (reportedly chronic)      ROS:  Constitutional: Positive for fatigue; negative for fever, chills  CV: Negative for chest pain, palpitations, edema.  Respiratory: Negative for dyspnea, cough, wheezing.  GI: Negative for nausea, abdominal pain, diarrhea.    10 point review of systems is otherwise negative with the exception of the elements mentioned above in the HPI.       Allergies   Allergen Reactions    Latex Rash    Ceftriaxone Other (See Comments)    Fentanyl Other (See Comments)     "my doctor said I had a stroke from it"    Influenza Vaccines Itching     unknown      Meperidine Other (See Comments)    Morphine Itching    Oxycodone Other (See Comments)     Past Medical History:   Diagnosis Date    Seizures ([place])     Sickle cell disease ([place])     Stroke ([place])      Past Surgical History:   Procedure Laterality Date    CESAREAN SECTION      LAPAROTOMY      TUBAL LIGATION       Family History   Problem Relation Age of Onset    Diabetes Mother     Hypertension Mother     Diabetes Father     Hypertension Father      Social History     Socioeconomic History    Marital status: Single     Spouse name: Not on file    Number of children: Not on file    Years of education: Not on file    Highest education level: Not on file   Occupational History    Not on file   Tobacco Use    Smoking status: Never    Smokeless tobacco: Never   Substance and Sexual Activity    Alcohol use: Never    Drug use: Never    Sexual activity: Not on file   Other Topics Concern    Not on file   Social History Narrative    Not on file     Social Determinants of Health     Financial  Resource Strain: Not on file   Food Insecurity: Not on file   Transportation Needs: Not on file   Physical Activity: Not on file   Stress: Not on file   Social Connections: Not on file   Intimate Partner Violence: Not on file   Housing Stability: Not on file     Current Facility-Administered Medications   Medication Dose Route Frequency Provider Last Rate Last Admin    levoFLOXacin (LEVAQUIN) tablet 500 mg  500 mg Oral Daily Kerby Nora, APRN - NP   500 mg at 01/14/22 3875    sodium chloride flush 0.9 % injection 5-40 mL  5-40 mL IntraVENous 2 times per day Milagros Reap, MD   10 mL at 01/13/22 2126    sodium chloride flush 0.9 % injection 5-40 mL  5-40  mL IntraVENous PRN Milagros Reap, MD   10 mL at 01/12/22 0832    0.9 % sodium chloride infusion   IntraVENous PRN Milagros Reap, MD        ondansetron (ZOFRAN-ODT) disintegrating tablet 4 mg  4 mg Oral Q8H PRN Milagros Reap, MD        Or    ondansetron [facility]) injection 4 mg  4 mg IntraVENous Q6H PRN Milagros Reap, MD        polyethylene glycol (GLYCOLAX) packet 17 g  17 g Oral Daily PRN Milagros Reap, MD        aluminum & magnesium hydroxide-simethicone (MAALOX) 200-200-20 MG/5ML suspension 30 mL  30 mL Oral Q6H PRN Milagros Reap, MD        acetaminophen (TYLENOL) tablet 650 mg  650 mg Oral Q6H PRN Milagros Reap, MD   650 mg at 01/13/22 1544    HYDROcodone-acetaminophen (NORCO) 5-325 MG per tablet 1 tablet  1 tablet Oral Q6H PRN Milagros Reap, MD   1 tablet at 01/12/22 2142    diphenhydrAMINE (BENADRYL) capsule 25 mg  25 mg Oral Q6H PRN Milagros Reap, MD   25 mg at 01/14/22 0648    FLUoxetine (PROZAC) capsule 40 mg  40 mg Oral Daily Milagros Reap, MD   40 mg at 01/14/22 0813    fluticasone (FLONASE) 50 MCG/ACT nasal spray 2 spray  2 spray Each Nostril Daily Milagros Reap, MD   2 spray at 16/10/96 0454    folic acid (FOLVITE) tablet 1 mg  1 mg Oral Daily Milagros Reap, MD   1 mg at 01/14/22 0813    hydroxyurea (HYDREA) chemo capsule 1,000 mg  1,000 mg Oral Daily Milagros Reap, MD   1,000 mg at 01/14/22 1037    levETIRAcetam (KEPPRA) tablet 1,500 mg  1,500 mg Oral BID Milagros Reap, MD   1,500 mg at 01/14/22 0981    rivaroxaban (XARELTO) tablet 20 mg  20 mg Oral Daily with breakfast Milagros Reap, MD   20 mg at 01/14/22 0812    miconazole (MICOTIN) 2 % powder   Topical BID Elmer Ramp, MD   Given at 01/14/22 0817    dextrose 5 % and 0.45 % sodium chloride infusion   IntraVENous Continuous Sheppard Penton, APRN - CNP 125 mL/hr at 01/14/22 0650 New Bag at 01/14/22 0650    HYDROmorphone HCl PF (DILAUDID) injection 1 mg  1 mg IntraVENous Q3H PRN Sheppard Penton, APRN - CNP   1 mg at 01/14/22 1011       OBJECTIVE:  Patient Vitals for the past 8 hrs:   BP Temp Temp src Pulse Resp SpO2   01/14/22 1116 112/69 97.9 ??F (36.6 ??C) Oral 93 20 99 %   01/14/22 0717 116/79 98 ??F (36.7 ??C) Axillary 79 16 100 %     Temp (24hrs), Avg:98.1 ??F (36.7 ??C), Min:97.9 ??F (36.6 ??C), Max:98.2 ??F (36.8 ??C)    02/11 0701 - 02/11 1900  In: 240 [P.O.:240]  Out: -     Physical Exam:  Constitutional: Well developed, well nourished female in no acute distress, sitting comfortably in the hospital bed.    HEENT: Normocephalic and atraumatic. Oropharynx is clear, mucous membranes are moist.  Extraocular muscles are intact.  Sclerae anicteric. Neck supple without JVD. No thyromegaly present.    Skin Warm and dry.  No bruising and no rash noted.  No  erythema.  No pallor.    Respiratory Lungs are clear to auscultation bilaterally without wheezes, rales or rhonchi, normal air exchange without accessory muscle use.    CVS Normal rate, regular rhythm and normal S1 and S2.  No murmurs, gallops, or rubs.   Abdomen Soft, nontender and nondistended, normoactive bowel sounds.  No palpable mass.  No hepatosplenomegaly.   Neuro Grossly nonfocal with no obvious sensory or motor deficits.   MSK Normal range of motion in general.  No edema and no tenderness.   Psych Appropriate mood and affect.        Labs:    Recent Results  (from the past 24 hour(s))   Reticulocytes    Collection Time: 01/14/22  7:29 AM   Result Value Ref Range    Reticulocyte Count,Automated 7.2 (H) 0.3 - 2.0 %    Absolute Retic # 0.1370 (H) 0.026 - 0.095 M/ul    Immature Retic Fraction 30.9 (H) 3.0 - 15.9 %    Retic Hemoglobin conc. 34 29 - 35 pg   CBC with Auto Differential    Collection Time: 01/14/22  7:29 AM   Result Value Ref Range    WBC 5.6 4.3 - 11.1 K/uL    RBC 1.90 (L) 4.05 - 5.2 M/uL    Hemoglobin 7.9 (L) 11.7 - 15.4 g/dL    Hematocrit 22.0 (L) 35.8 - 46.3 %    MCV 115.8 (H) 82 - 102 FL    MCH 41.6 (H) 26.1 - 32.9 PG    MCHC 35.9 (H) 31.4 - 35.0 g/dL    RDW 16.5 (H) 11.9 - 14.6 %    Platelets 307 150 - 450 K/uL    MPV 9.6 9.4 - 12.3 FL    nRBC 0.27 (H) 0.0 - 0.2 K/uL    Differential Type AUTOMATED      Seg Neutrophils 48 43 - 78 %    Lymphocytes 30 13 - 44 %    Monocytes 13 (H) 4.0 - 12.0 %    Eosinophils % 8 (H) 0.5 - 7.8 %    Basophils 0 0.0 - 2.0 %    Immature Granulocytes 0 0.0 - 5.0 %    Segs Absolute 2.7 1.7 - 8.2 K/UL    Absolute Lymph # 1.7 0.5 - 4.6 K/UL    Absolute Mono # 0.7 0.1 - 1.3 K/UL    Absolute Eos # 0.5 0.0 - 0.8 K/UL    Basophils Absolute 0.0 0.0 - 0.2 K/UL    Absolute Immature Granulocyte 0.0 0.0 - 0.5 K/UL   Comprehensive Metabolic Panel    Collection Time: 01/14/22  7:29 AM   Result Value Ref Range    Sodium 139 133 - 143 mmol/L    Potassium 4.0 3.5 - 5.1 mmol/L    Chloride 111 (H) 101 - 110 mmol/L    CO2 24 21 - 32 mmol/L    Anion Gap 4 2 - 11 mmol/L    Glucose 112 (H) 65 - 100 mg/dL    BUN 6 6 - 23 MG/DL    Creatinine 0.80 0.6 - 1.0 MG/DL    Est, Glom Filt Rate >60 >60 ml/min/1.35m    Calcium 9.0 8.3 - 10.4 MG/DL    Total Bilirubin 1.6 (H) 0.2 - 1.1 MG/DL    ALT 20 12 - 65 U/L    AST 26 15 - 37 U/L    Alk Phosphatase 84 50 - 136 U/L    Total Protein 7.4 6.3 - 8.2  g/dL    Albumin 3.4 (L) 3.5 - 5.0 g/dL    Globulin 4.0 2.8 - 4.5 g/dL    Albumin/Globulin Ratio 0.9 0.4 - 1.6     Magnesium    Collection Time: 01/14/22  7:29 AM   Result  Value Ref Range    Magnesium 1.9 1.8 - 2.4 mg/dL       Imaging:  None    ASSESSMENT:  Patient Active Problem List   Diagnosis    Hypercoagulable state ([place])    Seizure disorder (HCC)    Sickle cell anemia with crisis ([place])    Sickle cell disease ([place])    CAP (community acquired pneumonia)    Acute chest wall pain    Atopic rhinitis    Gastroesophageal reflux disease    Depression    Superior vena cava occlusion    History of DVT (deep vein thrombosis)    Menorrhagia    Abnormal uterine bleeding    Symptomatic anemia    Sickle cell pain crisis ([place])    Acute sinusitis    Sickle cell crisis [facility])     [name] Bright is a 46 y.o. female admitted on 01/12/2022. The encounter diagnosis was Sickle cell pain crisis ([place]).     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 She is on Hydrea 3267TI BID, folic acid. She reports she takes norco at home infrequently. She was last seen in ED 12/26/21 with leg pain and has had some visits to ED for pain but last admission for [facility] crisis was July 2022.      [name] Bright presents on day of admission with pain typical of SS crisis pain. She reports she has a great deal of stress in her life as of recently. 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 She has also had several deaths in the family recently. She denies abdominal pain, dyspnea, shortness of breath, cough, urinary issues/pain. She was admitted for further management of sickle cell pain crisis.        PLAN:  Sickle cell pain crisis / HbSS  - Continue Hydrea, folic acid  - Continue aggressive IVF  - Continue pain management - IV dilaudid 23m every 3 hrs and norco 530mevery 6 hrs PRN. Benadryl PO only  - Daily retic  - No other associated symptoms and on RA.   2/10 Retic 6.6.    2/11 Retic 7.2.  Hgb 7.9.  TB up slightly to 1.6 from 1.2.         Elevated Cr  - Cr 1.2 (most recent baseline 1.0-1.1)  - Continue IVF  2/10 Cr down to 0.8  2/11 Cr stable  at 0.8     Hx of DVT  - Continue Xarelto     Hx of seizures  - Continue Keppra     Intertriginous candida  - Nystatin powder     ? Sinusitis with drainage/HA  2/10 Start LVQ  2/11 Headaches better now    Right Neck Swelling  2/11 Decrease IVFs and check doppler of right neck.  Pt reports swelling has been chronic.      [place] home meds  [place] SOPs  VTE prophylaxis - on Xarelto     Dispo - Anticipate DC home when pain controlled. FU with Dr KhHumphrey Rollsn office within 1 week of DC.     Goals and plan of care reviewed with the  patient.  All questions answered to the best of our ability. We will gladly assume care of our patient.               Kerby Nora, APRN - NP   [facility]  [address]  [place] [address]  Office : [phone]  Fax : [phone]  I personally saw, exammed and counselled the patient, and discussed with NP, agree with above history/assessment/plan. 46 y.o.female admitted for sickle cell crisis, reports very stressed taking care of her sick mother, 1 family member got shot in the stomach lately and another 1 was shot died earlier, also with sinusitis, arrange IV fluid, oxygen, Hydrea, folic acid, Xarelto, Keppra, antibiotics for sinusitis, start Levaquin for sinusitis and coughing and improved, developing facial swelling and complaining of right neck pain, reduce IV fluid and arrange Doppler, continue above care.     Dellia Cloud, M.D.  [place] [address]  Office : [phone]  Fax : [phone]

## 2022-01-14 NOTE — Progress Notes (Signed)
END OF SHIFT NOTE:    INTAKE/OUTPUT  02/10 0701 - 02/11 0700  In: 3608 [P.O.:358; I.V.:3250]  Out: -   Voiding: Yes  Catheter: No  Drain:              Flatus: Patient does have flatus present.    Stool: 0 occurrences.    Characteristics:           Stool Assessment  Last BM (including prior to admit): 01/13/22 (Per pt)    Emesis: 0 occurrences.    Characteristics:        VITAL SIGNS  Patient Vitals for the past 12 hrs:   Temp Pulse Resp BP SpO2   01/14/22 1513 98.4 ??F (36.9 ??C) 88 18 112/73 90 %   01/14/22 1116 97.9 ??F (36.6 ??C) 93 20 112/69 99 %   01/14/22 0717 98 ??F (36.7 ??C) 79 16 116/79 100 %       Pain Assessment  Pain Level: 8 (01/14/22 1533)  Pain Location: Back, Leg  Patient's Stated Pain Goal: 0 - No pain    Ambulating  Yes    Shift report given to oncoming nurse at the bedside.    Worthy Keeler, RN

## 2022-01-14 NOTE — Progress Notes (Signed)
TRANSFER - OUT REPORT:    Verbal report given to Kristen, RN on [name] Bright  being transferred to 522 for routine progression of patient care       Report consisted of patient's Situation, Background, Assessment and   Recommendations(SBAR).     Information from the following report(s) Nurse Handoff Report was reviewed with the receiving nurse.  [place] Assessment: Presents to emergency department  because of falls (Syncope, seizure, or loss of consciousness): No, Age > 70: No, Altered Mental Status, Intoxication with alcohol or substance confusion (Disorientation, impaired judgment, poor safety awaremess, or inability to follow instructions): No, Impaired Mobility: Ambulates or transfers with assistive devices or assistance; Unable to ambulate or transer.: No  Lines:   Peripheral IV 01/12/22 Left Antecubital (Active)   Site Assessment Clean, dry & intact 01/14/22 0745   Line Status Infusing 01/14/22 0745   Line Care Connections checked and tightened 01/14/22 0745   Phlebitis Assessment No symptoms 01/14/22 0745   Infiltration Assessment 0 01/14/22 0745   Alcohol Cap Used No 01/14/22 0745   Dressing Status Clean, dry & intact 01/14/22 0745   Dressing Type Transparent 01/14/22 0745        Opportunity for questions and clarification was provided.      Patient transported with:  [employer]

## 2022-01-14 NOTE — Plan of Care (Signed)
Problem: Discharge Planning  Goal: Discharge to home or other facility with appropriate resources  Outcome: Progressing     Problem: Pain  Goal: Verbalizes/displays adequate comfort level or baseline comfort level  Outcome: Progressing     Problem: Safety - Adult  Goal: Free from fall injury  Outcome: Progressing     Problem: ABCDS Injury Assessment  Goal: Absence of physical injury  Outcome: Progressing

## 2022-01-15 LAB — CBC WITH AUTO DIFFERENTIAL
Absolute Eos #: 0.5 10*3/uL (ref 0.0–0.8)
Absolute Immature Granulocyte: 0 10*3/uL (ref 0.0–0.5)
Absolute Lymph #: 2 10*3/uL (ref 0.5–4.6)
Absolute Mono #: 0.8 10*3/uL (ref 0.1–1.3)
Basophils Absolute: 0 10*3/uL (ref 0.0–0.2)
Basophils: 0 % (ref 0.0–2.0)
Eosinophils %: 8 % — ABNORMAL HIGH (ref 0.5–7.8)
Hematocrit: 21.3 % — ABNORMAL LOW (ref 35.8–46.3)
Hemoglobin: 7.5 g/dL — ABNORMAL LOW (ref 11.7–15.4)
Immature Granulocytes: 0 % (ref 0.0–5.0)
Lymphocytes: 35 % (ref 13–44)
MCH: 40.3 PG — ABNORMAL HIGH (ref 26.1–32.9)
MCHC: 35.2 g/dL — ABNORMAL HIGH (ref 31.4–35.0)
MCV: 114.5 FL — ABNORMAL HIGH (ref 82–102)
MPV: 9.2 FL — ABNORMAL LOW (ref 9.4–12.3)
Monocytes: 14 % — ABNORMAL HIGH (ref 4.0–12.0)
Platelets: 328 10*3/uL (ref 150–450)
RBC: 1.86 M/uL — ABNORMAL LOW (ref 4.05–5.2)
RDW: 16.3 % — ABNORMAL HIGH (ref 11.9–14.6)
Seg Neutrophils: 42 % — ABNORMAL LOW (ref 43–78)
Segs Absolute: 2.4 10*3/uL (ref 1.7–8.2)
WBC: 5.8 10*3/uL (ref 4.3–11.1)
nRBC: 0.33 10*3/uL — ABNORMAL HIGH (ref 0.0–0.2)

## 2022-01-15 LAB — RETICULOCYTES
Absolute Retic #: 0.1453 M/ul — ABNORMAL HIGH (ref 0.026–0.095)
Immature Retic Fraction: 26.9 % — ABNORMAL HIGH (ref 3.0–15.9)
Retic Hemoglobin conc.: 33 pg (ref 29–35)
Reticulocyte Count,Automated: 7.8 % — ABNORMAL HIGH (ref 0.3–2.0)

## 2022-01-15 LAB — COMPREHENSIVE METABOLIC PANEL
ALT: 17 U/L (ref 12–65)
AST: 20 U/L (ref 15–37)
Albumin/Globulin Ratio: 0.8 (ref 0.4–1.6)
Albumin: 3.2 g/dL — ABNORMAL LOW (ref 3.5–5.0)
Alk Phosphatase: 87 U/L (ref 50–136)
Anion Gap: 4 mmol/L (ref 2–11)
BUN: 7 MG/DL (ref 6–23)
CO2: 26 mmol/L (ref 21–32)
Calcium: 8.8 MG/DL (ref 8.3–10.4)
Chloride: 109 mmol/L (ref 101–110)
Creatinine: 0.8 MG/DL (ref 0.6–1.0)
Est, Glom Filt Rate: >60 mL/min/{1.73_m2} (ref >60)
Globulin: 3.9 g/dL (ref 2.8–4.5)
Glucose: 110 mg/dL — ABNORMAL HIGH (ref 65–100)
Potassium: 3.8 mmol/L (ref 3.5–5.1)
Sodium: 139 mmol/L (ref 133–143)
Total Bilirubin: 1.6 MG/DL — ABNORMAL HIGH (ref 0.2–1.1)
Total Protein: 7.1 g/dL (ref 6.3–8.2)

## 2022-01-15 LAB — MAGNESIUM: Magnesium: 1.8 mg/dL (ref 1.8–2.4)

## 2022-01-15 MED ORDER — HYDROMORPHONE HCL PF 1 MG/ML IJ SOLN
1 MG/ML | INTRAMUSCULAR | Status: AC | PRN
Start: 2022-01-15 — End: 2022-01-17
  Administered 2022-01-15 – 2022-01-17 (×7): 1 mg via INTRAVENOUS

## 2022-01-15 MED FILL — LEVOFLOXACIN 500 MG PO TABS: 500 MG | ORAL | Qty: 1

## 2022-01-15 MED FILL — FLUOXETINE HCL 20 MG PO CAPS: 20 MG | ORAL | Qty: 2

## 2022-01-15 MED FILL — LEVETIRACETAM 500 MG PO TABS: 500 MG | ORAL | Qty: 3

## 2022-01-15 MED FILL — HYDROMORPHONE HCL 1 MG/ML IJ SOLN: 1 MG/ML | INTRAMUSCULAR | Qty: 1

## 2022-01-15 MED FILL — FOLIC ACID 1 MG PO TABS: 1 MG | ORAL | Qty: 1

## 2022-01-15 MED FILL — HYDROXYUREA 500 MG PO CAPS: 500 MG | ORAL | Qty: 2

## 2022-01-15 MED FILL — XARELTO 20 MG PO TABS: 20 MG | ORAL | Qty: 1

## 2022-01-15 MED FILL — FLUTICASONE PROPIONATE 50 MCG/ACT NA SUSP: 50 MCG/ACT | NASAL | Qty: 16

## 2022-01-15 NOTE — Progress Notes (Signed)
[name]        Inpatient Hematology / Oncology Progress Note    Reason for Admission:  Sickle cell crisis ([place]) [D57.00]  Sickle cell pain crisis (H[facility]) [D57.00]    24 Hour Events:  VSS, Afebrile  Retic 7.8 (7.2)  Hgb 7.5 (7.9)  TB 1.6 (1.6)    Prn Dilaudid IV for pain - decreased frequency  Try oral pain medication first   LVQ for sinusitis   Headache better today  C/o right neck swelling (reportedly chronic) - [place] result pending  IVFs decreased d/t generalized facial swelling/puffiness       ROS:  Constitutional: Positive for fatigue; negative for fever, chills  CV: Negative for chest pain, palpitations, edema.  Respiratory: Negative for dyspnea, cough, wheezing.  GI: Negative for nausea, abdominal pain, diarrhea.    10 point review of systems is otherwise negative with the exception of the elements mentioned above in the HPI.       Allergies   Allergen Reactions    Latex Rash    Ceftriaxone Other (See Comments)    Fentanyl Other (See Comments)     "my doctor said I had a stroke from it"    Influenza Vaccines Itching     unknown      Meperidine Other (See Comments)    Morphine Itching    Oxycodone Other (See Comments)     Past Medical History:   Diagnosis Date    Seizures ([place])     Sickle cell disease ([place])     Stroke ([place])      Past Surgical History:   Procedure Laterality Date    CESAREAN SECTION      LAPAROTOMY      TUBAL LIGATION       Family History   Problem Relation Age of Onset    Diabetes Mother     Hypertension Mother     Diabetes Father     Hypertension Father      Social History     Socioeconomic History    Marital status: Single     Spouse name: Not on file    Number of children: Not on file    Years of education: Not on file    Highest education level: Not on file   Occupational History    Not on file   Tobacco Use    Smoking status: Never    Smokeless tobacco: Never   Substance and Sexual Activity    Alcohol use: Never    Drug use: Never    Sexual activity: Not on file    Other Topics Concern    Not on file   Social History Narrative    Not on file     Social Determinants of Health     Financial Resource Strain: Not on file   Food Insecurity: Not on file   Transportation Needs: Not on file   Physical Activity: Not on file   Stress: Not on file   Social Connections: Not on file   Intimate Partner Violence: Not on file   Housing Stability: Not on file     Current Facility-Administered Medications   Medication Dose Route Frequency Provider Last Rate Last Admin    levoFLOXacin (LEVAQUIN) tablet 500 mg  500 mg Oral Daily Kerby Nora, APRN - NP   500 mg at 01/14/22 7616    sodium chloride flush 0.9 % injection 5-40 mL  5-40 mL IntraVENous 2 times per day Milagros Reap,  MD   10 mL at 01/14/22 2200    sodium chloride flush 0.9 % injection 5-40 mL  5-40 mL IntraVENous PRN Milagros Reap, MD   20 mL at 01/15/22 0156    0.9 % sodium chloride infusion   IntraVENous PRN Milagros Reap, MD        ondansetron (ZOFRAN-ODT) disintegrating tablet 4 mg  4 mg Oral Q8H PRN Milagros Reap, MD        Or    ondansetron [facility]) injection 4 mg  4 mg IntraVENous Q6H PRN Milagros Reap, MD        polyethylene glycol (GLYCOLAX) packet 17 g  17 g Oral Daily PRN Milagros Reap, MD        aluminum & magnesium hydroxide-simethicone (MAALOX) 200-200-20 MG/5ML suspension 30 mL  30 mL Oral Q6H PRN Milagros Reap, MD        acetaminophen (TYLENOL) tablet 650 mg  650 mg Oral Q6H PRN Milagros Reap, MD   650 mg at 01/13/22 1544    HYDROcodone-acetaminophen (NORCO) 5-325 MG per tablet 1 tablet  1 tablet Oral Q6H PRN Milagros Reap, MD   1 tablet at 01/12/22 2142    diphenhydrAMINE (BENADRYL) capsule 25 mg  25 mg Oral Q6H PRN Milagros Reap, MD   25 mg at 01/14/22 0648    FLUoxetine (PROZAC) capsule 40 mg  40 mg Oral Daily Milagros Reap, MD   40 mg at 01/14/22 0813    fluticasone (FLONASE) 50 MCG/ACT nasal spray 2 spray  2 spray Each Nostril Daily Milagros Reap, MD   2 spray at 27/06/23 7628    folic acid (FOLVITE) tablet 1 mg  1 mg  Oral Daily Milagros Reap, MD   1 mg at 01/14/22 0813    hydroxyurea (HYDREA) chemo capsule 1,000 mg  1,000 mg Oral Daily Milagros Reap, MD   1,000 mg at 01/14/22 1037    levETIRAcetam (KEPPRA) tablet 1,500 mg  1,500 mg Oral BID Milagros Reap, MD   1,500 mg at 01/14/22 2154    rivaroxaban (XARELTO) tablet 20 mg  20 mg Oral Daily with breakfast Milagros Reap, MD   20 mg at 01/14/22 0812    miconazole (MICOTIN) 2 % powder   Topical BID Elmer Ramp, MD   Given at 01/14/22 0817    dextrose 5 % and 0.45 % sodium chloride infusion   IntraVENous Continuous Kerby Nora, APRN - NP 75 mL/hr at 01/15/22 0715 New Bag at 01/15/22 0715    HYDROmorphone HCl PF (DILAUDID) injection 1 mg  1 mg IntraVENous Q3H PRN Sheppard Penton, APRN - CNP   1 mg at 01/15/22 3151       OBJECTIVE:  Patient Vitals for the past 8 hrs:   BP Temp Temp src Pulse Resp SpO2   01/15/22 0801 106/64 98 ??F (36.7 ??C) Oral 90 21 94 %   01/15/22 0318 104/73 98.1 ??F (36.7 ??C) Oral 89 18 91 %     Temp (24hrs), Avg:98 ??F (36.7 ??C), Min:97.3 ??F (36.3 ??C), Max:98.4 ??F (36.9 ??C)    No intake/output data recorded.    Physical Exam:  Constitutional: Well developed, well nourished female in no acute distress, sitting comfortably in the hospital bed.    HEENT: Normocephalic and atraumatic. Oropharynx is clear, mucous membranes are moist.  Facial swelling (generalized) and right neck swelling (chronic).      Skin Warm and dry.  No  bruising and no rash noted.  No erythema.  No pallor.    Respiratory Lungs are clear to auscultation bilaterally without wheezes, rales or rhonchi, normal air exchange without accessory muscle use.    CVS Normal rate, regular rhythm and normal S1 and S2.  No murmurs, gallops, or rubs.   Abdomen Soft, nontender and nondistended, normoactive bowel sounds.  No palpable mass.  No hepatosplenomegaly.   Neuro Grossly nonfocal with no obvious sensory or motor deficits.   MSK Normal range of motion in general.  No LE edema.     Psych Appropriate  mood and affect.        Labs:    Recent Results (from the past 24 hour(s))   Reticulocytes    Collection Time: 01/15/22  6:14 AM   Result Value Ref Range    Reticulocyte Count,Automated 7.8 (H) 0.3 - 2.0 %    Absolute Retic # 0.1453 (H) 0.026 - 0.095 M/ul    Immature Retic Fraction 26.9 (H) 3.0 - 15.9 %    Retic Hemoglobin conc. 33 29 - 35 pg   CBC with Auto Differential    Collection Time: 01/15/22  6:14 AM   Result Value Ref Range    WBC 5.8 4.3 - 11.1 K/uL    RBC 1.86 (L) 4.05 - 5.2 M/uL    Hemoglobin 7.5 (L) 11.7 - 15.4 g/dL    Hematocrit 21.3 (L) 35.8 - 46.3 %    MCV 114.5 (H) 82 - 102 FL    MCH 40.3 (H) 26.1 - 32.9 PG    MCHC 35.2 (H) 31.4 - 35.0 g/dL    RDW 16.3 (H) 11.9 - 14.6 %    Platelets 328 150 - 450 K/uL    MPV 9.2 (L) 9.4 - 12.3 FL    nRBC 0.33 (H) 0.0 - 0.2 K/uL    Differential Type AUTOMATED      Seg Neutrophils 42 (L) 43 - 78 %    Lymphocytes 35 13 - 44 %    Monocytes 14 (H) 4.0 - 12.0 %    Eosinophils % 8 (H) 0.5 - 7.8 %    Basophils 0 0.0 - 2.0 %    Immature Granulocytes 0 0.0 - 5.0 %    Segs Absolute 2.4 1.7 - 8.2 K/UL    Absolute Lymph # 2.0 0.5 - 4.6 K/UL    Absolute Mono # 0.8 0.1 - 1.3 K/UL    Absolute Eos # 0.5 0.0 - 0.8 K/UL    Basophils Absolute 0.0 0.0 - 0.2 K/UL    Absolute Immature Granulocyte 0.0 0.0 - 0.5 K/UL   Comprehensive Metabolic Panel    Collection Time: 01/15/22  6:14 AM   Result Value Ref Range    Sodium 139 133 - 143 mmol/L    Potassium 3.8 3.5 - 5.1 mmol/L    Chloride 109 101 - 110 mmol/L    CO2 26 21 - 32 mmol/L    Anion Gap 4 2 - 11 mmol/L    Glucose 110 (H) 65 - 100 mg/dL    BUN 7 6 - 23 MG/DL    Creatinine 0.80 0.6 - 1.0 MG/DL    Est, Glom Filt Rate >60 >60 ml/min/1.19m    Calcium 8.8 8.3 - 10.4 MG/DL    Total Bilirubin 1.6 (H) 0.2 - 1.1 MG/DL    ALT 17 12 - 65 U/L    AST 20 15 - 37 U/L    Alk Phosphatase 87 50 - 136 U/L  Total Protein 7.1 6.3 - 8.2 g/dL    Albumin 3.2 (L) 3.5 - 5.0 g/dL    Globulin 3.9 2.8 - 4.5 g/dL    Albumin/Globulin Ratio 0.8 0.4 - 1.6      Magnesium    Collection Time: 01/15/22  6:14 AM   Result Value Ref Range    Magnesium 1.8 1.8 - 2.4 mg/dL       Imaging:  None    ASSESSMENT:  Patient Active Problem List   Diagnosis    Hypercoagulable state ([place])    Seizure disorder (HCC)    Sickle cell anemia with crisis ([place])    Sickle cell disease ([place])    CAP (community acquired pneumonia)    Acute chest wall pain    Atopic rhinitis    Gastroesophageal reflux disease    Depression    Superior vena cava occlusion    History of DVT (deep vein thrombosis)    Menorrhagia    Abnormal uterine bleeding    Symptomatic anemia    Sickle cell pain crisis ([place])    Acute sinusitis    Sickle cell crisis [facility])     [name] is a 46 y.o. female admitted on 01/12/2022. The encounter diagnosis was Sickle cell pain crisis ([place]).     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 She is on Hydrea 7564PP BID, folic acid. She reports she takes norco at home infrequently. She was last seen in ED 12/26/21 with leg pain and has had some visits to ED for pain but last admission for [facility] crisis was July 2022.      [name] Bright presents on day of admission with pain typical of SS crisis pain. She reports she has a great deal of stress in her life as of recently. 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 She has also had several deaths in the family recently. She denies abdominal pain, dyspnea, shortness of breath, cough, urinary issues/pain. She was admitted for further management of sickle cell pain crisis.        PLAN:  Sickle cell pain crisis / HbSS  - Continue Hydrea, folic acid  - Continue aggressive IVF  - Continue pain management - IV dilaudid 59m every 3 hrs and norco 511mevery 6 hrs PRN. Benadryl PO only  - Daily retic  - No other associated symptoms and on RA.   2/10 Retic 6.6.    2/11 Retic 7.2.  Hgb 7.9.  TB up slightly to 1.6 from 1.2.    2/12 Retic 7.8, Hgb 7.5.  TB stable at 1.6.   Decreased IV dilaudid frequency, try oral Norco first.        Elevated Cr  - Cr 1.2 (most recent baseline 1.0-1.1)  - Continue IVF  2/10 Cr down to 0.8  2/11 Cr stable at 0.8  2/12 Cr stable at 0.8.       Hx of DVT  - Continue Xarelto     Hx of seizures  - Continue Keppra     Intertriginous candida  - Nystatin powder     ? Sinusitis with drainage/HA  2/10 Start LVQ  2/11 Headaches better now    Right Neck Swelling/Facial Swelling  2/11 Decrease IVFs and check doppler of right neck.  Pt reports swelling has been chronic.   2/12 [place] result.       Cotninue home meds  [place] SOPs  VTE prophylaxis - on Xarelto     Dispo - Anticipate DC home when pain controlled. FU with Dr [name] in office within 1 week of DC.     Goals and plan of care reviewed with the patient.  All questions answered to the best of our ability. We will gladly assume care of our patient.               Kerby Nora, APRN - NP   [facility]  [address] [place] [address]  Office : [phone]  Fax : [phone]   I personally saw, exammed and counselled the patient, and discussed with NP, agree with above history/assessment/plan. 46 y.o.female admitted for sickle cell crisis, reports very stressed taking care of her sick mother, 1 family member got shot in the stomach lately and another 1 was shot died earlier, also with sinusitis, arrange IV fluid, oxygen, Hydrea, folic acid, Xarelto, Keppra, antibiotics for sinusitis, start Levaquin for sinusitis and coughing and improved, developing facial swelling and complaining of right neck pain, Doppler negative for DVT, try to convert to oral pain med, continue above care.     Dellia Cloud, M[place] [address]  Office : [phone]  Fax : [phone]

## 2022-01-15 NOTE — Progress Notes (Signed)
Continuing to assess needs of family

## 2022-01-15 NOTE — Progress Notes (Signed)
Reviewed notes for new spiritual concerns      Will continue to assess how we can best serve this family        WITH [facility] WE PRAY FOR YOU AND YOUR FAMILY TODAY.      Per notes:       Faith unknown    [place]    Dad - [name] [facility]    Full code    My chart is active    Moderate risk falls

## 2022-01-16 LAB — COMPREHENSIVE METABOLIC PANEL
ALT: 17 U/L (ref 12–65)
AST: 24 U/L (ref 15–37)
Albumin/Globulin Ratio: 0.8 (ref 0.4–1.6)
Albumin: 3.2 g/dL — ABNORMAL LOW (ref 3.5–5.0)
Alk Phosphatase: 80 U/L (ref 50–136)
Anion Gap: 7 mmol/L (ref 2–11)
BUN: 6 MG/DL (ref 6–23)
CO2: 26 mmol/L (ref 21–32)
Calcium: 8.7 MG/DL (ref 8.3–10.4)
Chloride: 107 mmol/L (ref 101–110)
Creatinine: 0.8 MG/DL (ref 0.6–1.0)
Est, Glom Filt Rate: >60 mL/min/{1.73_m2} (ref >60)
Globulin: 3.9 g/dL (ref 2.8–4.5)
Glucose: 107 mg/dL — ABNORMAL HIGH (ref 65–100)
Potassium: 3.5 mmol/L (ref 3.5–5.1)
Sodium: 140 mmol/L (ref 133–143)
Total Bilirubin: 1.4 MG/DL — ABNORMAL HIGH (ref 0.2–1.1)
Total Protein: 7.1 g/dL (ref 6.3–8.2)

## 2022-01-16 LAB — RETICULOCYTES
Absolute Retic #: 0.1445 M/ul — ABNORMAL HIGH (ref 0.026–0.095)
Immature Retic Fraction: 31.2 % — ABNORMAL HIGH (ref 3.0–15.9)
Retic Hemoglobin conc.: 34 pg (ref 29–35)
Reticulocyte Count,Automated: 7.8 % — ABNORMAL HIGH (ref 0.3–2.0)

## 2022-01-16 LAB — CBC WITH AUTO DIFFERENTIAL
Absolute Eos #: 0.4 10*3/uL (ref 0.0–0.8)
Absolute Immature Granulocyte: 0 10*3/uL (ref 0.0–0.5)
Absolute Lymph #: 2.2 10*3/uL (ref 0.5–4.6)
Absolute Mono #: 0.8 10*3/uL (ref 0.1–1.3)
Basophils Absolute: 0 10*3/uL (ref 0.0–0.2)
Basophils: 0 % (ref 0.0–2.0)
Eosinophils %: 7 % (ref 0.5–7.8)
Hematocrit: 21.2 % — ABNORMAL LOW (ref 35.8–46.3)
Hemoglobin: 7.6 g/dL — ABNORMAL LOW (ref 11.7–15.4)
Immature Granulocytes: 0 % (ref 0.0–5.0)
Lymphocytes: 36 % (ref 13–44)
MCH: 41.1 PG — ABNORMAL HIGH (ref 26.1–32.9)
MCHC: 35.8 g/dL — ABNORMAL HIGH (ref 31.4–35.0)
MCV: 114.6 FL — ABNORMAL HIGH (ref 82–102)
MPV: 9.3 FL — ABNORMAL LOW (ref 9.4–12.3)
Monocytes: 13 % — ABNORMAL HIGH (ref 4.0–12.0)
Platelets: 331 10*3/uL (ref 150–450)
RBC: 1.85 M/uL — ABNORMAL LOW (ref 4.05–5.2)
RDW: 16.2 % — ABNORMAL HIGH (ref 11.9–14.6)
Seg Neutrophils: 43 % (ref 43–78)
Segs Absolute: 2.7 10*3/uL (ref 1.7–8.2)
WBC: 6.2 10*3/uL (ref 4.3–11.1)
nRBC: 0.45 10*3/uL — ABNORMAL HIGH (ref 0.0–0.2)

## 2022-01-16 LAB — MAGNESIUM: Magnesium: 1.7 mg/dL — ABNORMAL LOW (ref 1.8–2.4)

## 2022-01-16 MED ORDER — HYDROCODONE-ACETAMINOPHEN 5-325 MG PO TABS
5-325 | Freq: Four times a day (QID) | ORAL | Status: DC | PRN
Start: 2022-01-16 — End: 2022-01-17
  Administered 2022-01-16 – 2022-01-17 (×3): 2 via ORAL

## 2022-01-16 MED FILL — FLUOXETINE HCL 20 MG PO CAPS: 20 MG | ORAL | Qty: 2

## 2022-01-16 MED FILL — HYDROMORPHONE HCL 1 MG/ML IJ SOLN: 1 MG/ML | INTRAMUSCULAR | Qty: 1

## 2022-01-16 MED FILL — FOLIC ACID 1 MG PO TABS: 1 MG | ORAL | Qty: 1

## 2022-01-16 MED FILL — DIPHENHYDRAMINE HCL 25 MG PO CAPS: 25 MG | ORAL | Qty: 1

## 2022-01-16 MED FILL — HYDROCODONE-ACETAMINOPHEN 5-325 MG PO TABS: 5-325 MG | ORAL | Qty: 2

## 2022-01-16 MED FILL — HYDROXYUREA 500 MG PO CAPS: 500 MG | ORAL | Qty: 2

## 2022-01-16 MED FILL — LEVETIRACETAM 500 MG PO TABS: 500 MG | ORAL | Qty: 3

## 2022-01-16 MED FILL — HYDROCODONE-ACETAMINOPHEN 5-325 MG PO TABS: 5-325 MG | ORAL | Qty: 1

## 2022-01-16 MED FILL — LEVOFLOXACIN 500 MG PO TABS: 500 MG | ORAL | Qty: 1

## 2022-01-16 MED FILL — XARELTO 20 MG PO TABS: 20 MG | ORAL | Qty: 1

## 2022-01-16 NOTE — Progress Notes (Signed)
[facility]        Inpatient Hematology / Oncology Progress Note    Reason for Admission:  Sickle cell crisis ([place]) [D57.00]  Sickle cell pain crisis ([place]) [D57.00]    24 Hour Events:  VSS, Afebrile  Hgb and retic stable  TB stable  Utilizing IV pain meds, attempting to wean to PO  Complains of upper back pain after hitting it on hard surface  Hopeful for DC home in 24-48 hrs      ROS:  Constitutional: Positive for fatigue; negative for fever, chills  CV: Negative for chest pain, palpitations, edema.  Respiratory: Negative for dyspnea, cough, wheezing.  GI: Negative for nausea, abdominal pain, diarrhea.    10 point review of systems is otherwise negative with the exception of the elements mentioned above in the HPI.       Allergies   Allergen Reactions    Latex Rash    Ceftriaxone Other (See Comments)    Fentanyl Other (See Comments)     "my doctor said I had a stroke from it"    Influenza Vaccines Itching     unknown      Meperidine Other (See Comments)    Morphine Itching    Oxycodone Other (See Comments)     Past Medical History:   Diagnosis Date    Seizures ([place])     Sickle cell disease ([place])     Stroke ([place])      Past Surgical History:   Procedure Laterality Date    CESAREAN SECTION      LAPAROTOMY      TUBAL LIGATION       Family History   Problem Relation Age of Onset    Diabetes Mother     Hypertension Mother     Diabetes Father     Hypertension Father      Social History     Socioeconomic History    Marital status: Single     Spouse name: Not on file    Number of children: Not on file    Years of education: Not on file    Highest education level: Not on file   Occupational History    Not on file   Tobacco Use    Smoking status: Never    Smokeless tobacco: Never   Substance and Sexual Activity    Alcohol use: Never    Drug use: Never    Sexual activity: Not on file   Other Topics Concern    Not on file   Social History Narrative    Not on file     Social Determinants of Health      Financial Resource Strain: Not on file   Food Insecurity: Not on file   Transportation Needs: Not on file   Physical Activity: Not on file   Stress: Not on file   Social Connections: Not on file   Intimate Partner Violence: Not on file   Housing Stability: Not on file     Current Facility-Administered Medications   Medication Dose Route Frequency Provider Last Rate Last Admin    HYDROcodone-acetaminophen (NORCO) 5-325 MG per tablet 2 tablet  2 tablet Oral Q6H PRN Sheppard Penton, APRN - CNP        HYDROmorphone HCl PF (DILAUDID) injection 1 mg  1 mg IntraVENous Q4H PRN Kerby Nora, APRN - NP   1 mg at 01/16/22 0838    levoFLOXacin (LEVAQUIN) tablet 500 mg  500 mg Oral  Daily Kerby Nora, APRN - NP   500 mg at 01/16/22 0841    sodium chloride flush 0.9 % injection 5-40 mL  5-40 mL IntraVENous 2 times per day Milagros Reap, MD   10 mL at 01/16/22 0848    sodium chloride flush 0.9 % injection 5-40 mL  5-40 mL IntraVENous PRN Milagros Reap, MD   10 mL at 01/16/22 0329    0.9 % sodium chloride infusion   IntraVENous PRN Milagros Reap, MD        ondansetron (ZOFRAN-ODT) disintegrating tablet 4 mg  4 mg Oral Q8H PRN Milagros Reap, MD        Or    ondansetron [facility]) injection 4 mg  4 mg IntraVENous Q6H PRN Milagros Reap, MD        polyethylene glycol (GLYCOLAX) packet 17 g  17 g Oral Daily PRN Milagros Reap, MD        aluminum & magnesium hydroxide-simethicone (MAALOX) 200-200-20 MG/5ML suspension 30 mL  30 mL Oral Q6H PRN Milagros Reap, MD        acetaminophen (TYLENOL) tablet 650 mg  650 mg Oral Q6H PRN Milagros Reap, MD   650 mg at 01/13/22 1544    diphenhydrAMINE (BENADRYL) capsule 25 mg  25 mg Oral Q6H PRN Milagros Reap, MD   25 mg at 01/16/22 0852    FLUoxetine (PROZAC) capsule 40 mg  40 mg Oral Daily Milagros Reap, MD   40 mg at 01/16/22 0841    fluticasone (FLONASE) 50 MCG/ACT nasal spray 2 spray  2 spray Each Nostril Daily Milagros Reap, MD   2 spray at 16/10/96 0454    folic acid (FOLVITE) tablet 1 mg  1 mg  Oral Daily Milagros Reap, MD   1 mg at 01/16/22 0841    hydroxyurea (HYDREA) chemo capsule 1,000 mg  1,000 mg Oral Daily Milagros Reap, MD   1,000 mg at 01/16/22 0841    levETIRAcetam (KEPPRA) tablet 1,500 mg  1,500 mg Oral BID Milagros Reap, MD   1,500 mg at 01/16/22 0841    rivaroxaban (XARELTO) tablet 20 mg  20 mg Oral Daily with breakfast Milagros Reap, MD   20 mg at 01/16/22 0840    miconazole (MICOTIN) 2 % powder   Topical BID Elmer Ramp, MD   Patient/Family Admin at 01/16/22 0847    dextrose 5 % and 0.45 % sodium chloride infusion   IntraVENous Continuous Kerby Nora, APRN - NP 75 mL/hr at 01/16/22 1016 New Bag at 01/16/22 1016       OBJECTIVE:  Patient Vitals for the past 8 hrs:   BP Temp Temp src Pulse Resp SpO2   01/16/22 0829 114/77 98.2 ??F (36.8 ??C) Oral 78 20 92 %   01/16/22 0324 107/73 97.9 ??F (36.6 ??C) Oral 78 16 94 %     Temp (24hrs), Avg:98.2 ??F (36.8 ??C), Min:97.9 ??F (36.6 ??C), Max:98.6 ??F (37 ??C)    No intake/output data recorded.    Physical Exam:  Constitutional: Well developed, well nourished female in no acute distress, sitting comfortably in the hospital bed.    HEENT: Normocephalic and atraumatic. Oropharynx is clear, mucous membranes are moist.  Facial swelling (generalized) and right neck swelling (chronic).      Skin +mild scratch to upper back. Warm and dry.  No bruising and no rash noted.  No erythema.  No pallor.    Respiratory Lungs  are clear to auscultation bilaterally without wheezes, rales or rhonchi, normal air exchange without accessory muscle use.    CVS Normal rate, regular rhythm and normal S1 and S2.  No murmurs, gallops, or rubs.   Abdomen Soft, nontender and nondistended, normoactive bowel sounds.  No palpable mass.  No hepatosplenomegaly.   Neuro Grossly nonfocal with no obvious sensory or motor deficits.   MSK Normal range of motion in general.  No LE edema.     Psych Appropriate mood and affect.        Labs:    Recent Results (from the past 24 hour(s))    Reticulocytes    Collection Time: 01/16/22  7:43 AM   Result Value Ref Range    Reticulocyte Count,Automated 7.8 (H) 0.3 - 2.0 %    Absolute Retic # 0.1445 (H) 0.026 - 0.095 M/ul    Immature Retic Fraction 31.2 (H) 3.0 - 15.9 %    Retic Hemoglobin conc. 34 29 - 35 pg   CBC with Auto Differential    Collection Time: 01/16/22  7:43 AM   Result Value Ref Range    WBC 6.2 4.3 - 11.1 K/uL    RBC 1.85 (L) 4.05 - 5.2 M/uL    Hemoglobin 7.6 (L) 11.7 - 15.4 g/dL    Hematocrit 21.2 (L) 35.8 - 46.3 %    MCV 114.6 (H) 82 - 102 FL    MCH 41.1 (H) 26.1 - 32.9 PG    MCHC 35.8 (H) 31.4 - 35.0 g/dL    RDW 16.2 (H) 11.9 - 14.6 %    Platelets 331 150 - 450 K/uL    MPV 9.3 (L) 9.4 - 12.3 FL    nRBC 0.45 (H) 0.0 - 0.2 K/uL    Differential Type AUTOMATED      Seg Neutrophils 43 43 - 78 %    Lymphocytes 36 13 - 44 %    Monocytes 13 (H) 4.0 - 12.0 %    Eosinophils % 7 0.5 - 7.8 %    Basophils 0 0.0 - 2.0 %    Immature Granulocytes 0 0.0 - 5.0 %    Segs Absolute 2.7 1.7 - 8.2 K/UL    Absolute Lymph # 2.2 0.5 - 4.6 K/UL    Absolute Mono # 0.8 0.1 - 1.3 K/UL    Absolute Eos # 0.4 0.0 - 0.8 K/UL    Basophils Absolute 0.0 0.0 - 0.2 K/UL    Absolute Immature Granulocyte 0.0 0.0 - 0.5 K/UL   Comprehensive Metabolic Panel    Collection Time: 01/16/22  7:43 AM   Result Value Ref Range    Sodium 140 133 - 143 mmol/L    Potassium 3.5 3.5 - 5.1 mmol/L    Chloride 107 101 - 110 mmol/L    CO2 26 21 - 32 mmol/L    Anion Gap 7 2 - 11 mmol/L    Glucose 107 (H) 65 - 100 mg/dL    BUN 6 6 - 23 MG/DL    Creatinine 0.80 0.6 - 1.0 MG/DL    Est, Glom Filt Rate >60 >60 ml/min/1.30m    Calcium 8.7 8.3 - 10.4 MG/DL    Total Bilirubin 1.4 (H) 0.2 - 1.1 MG/DL    ALT 17 12 - 65 U/L    AST 24 15 - 37 U/L    Alk Phosphatase 80 50 - 136 U/L    Total Protein 7.1 6.3 - 8.2 g/dL    Albumin 3.2 (L) 3.5 - 5.0  g/dL    Globulin 3.9 2.8 - 4.5 g/dL    Albumin/Globulin Ratio 0.8 0.4 - 1.6     Magnesium    Collection Time: 01/16/22  7:43 AM   Result Value Ref Range    Magnesium  1.7 (L) 1.8 - 2.4 mg/dL       Imaging:  None    ASSESSMENT:  Patient Active Problem List   Diagnosis    Hypercoagulable state ([place])    Seizure disorder (HCC)    Sickle cell anemia with crisis ([place])    Sickle cell disease ([place])    CAP (community acquired pneumonia)    Acute chest wall pain    Atopic rhinitis    Gastroesophageal reflux disease    Depression    Superior vena cava occlusion    History of DVT (deep vein thrombosis)    Menorrhagia    Abnormal uterine bleeding    Symptomatic anemia    Sickle cell pain crisis ([place])    Acute sinusitis    Sickle cell crisis [facility])     [name]. Bright is a 46 y.o. female admitted on 01/12/2022. The encounter diagnosis was Sickle cell pain crisis ([place]).     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 She is on Hydrea 1308MV BID, folic acid. She reports she takes norco at home infrequently. She was last seen in ED 12/26/21 with leg pain and has had some visits to ED for pain but last admission for [facility] crisis was July 2022.      [name] Neiswender presents on day of admission with pain typical of SS crisis pain. She reports she has a great deal of stress in her life as of recently. 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 She has also had several deaths in the family recently. She denies abdominal pain, dyspnea, shortness of breath, cough, urinary issues/pain. She was admitted for further management of sickle cell pain crisis.        PLAN:  Sickle cell pain crisis / HbSS  - Continue Hydrea, folic acid  - Continue aggressive IVF  - Continue pain management - IV dilaudid 49m every 3 hrs and norco 533mevery 6 hrs PRN. Benadryl PO only  - Daily retic  - No other associated symptoms and on RA.   2/10 Retic 6.6.    2/11 Retic 7.2.  Hgb 7.9.  TB up slightly to 1.6 from 1.2.    2/12 Retic 7.8, Hgb 7.5.  TB stable at 1.6.  Decreased IV dilaudid frequency, try oral Norco first.     2/13 Continues to utilize IV  pain meds. Norco not effective, will increase dose and include PO indication for severe pain.      Elevated Cr  - Cr 1.2 (most recent baseline 1.0-1.1)  - Continue IVF  2/10 Cr down to 0.8  2/11 Cr stable at 0.8  2/12 Cr stable at 0.8.    RESOLVED     Hx of DVT  - Continue Xarelto     Hx of seizures  - Continue Keppra     Intertriginous candida  - Nystatin powder     ? Sinusitis with drainage/HA  2/10 Start LVQ  2/11 Headaches better now    Right Neck Swelling/Facial Swelling  2/11 Decrease IVFs and check doppler of right neck.  Pt reports swelling has been chronic.   2/12 [place] result.  2/13 Unremarkable with exception of thyroid nodule - will need 1 yr follow-up US. No complaint of swelling today.    Cotninue home meds  [place] SOPs  VTE prophylaxis - on Xarelto     Dispo - Anticipate DC home when pain controlled. FU with Dr [name] in office within 1 week of DC.     Goals and plan of care reviewed with the patient.  All questions answered to the best of our ability.              Sheppard Penton, APRN - [facility]  [address] [place] [address]  Office : [phone]  Fax : [phone]

## 2022-01-17 LAB — RETICULOCYTES
Absolute Retic #: 0.1584 M/ul — ABNORMAL HIGH (ref 0.026–0.095)
Immature Retic Fraction: 31.1 % — ABNORMAL HIGH (ref 3.0–15.9)
Retic Hemoglobin conc.: 38 pg — ABNORMAL HIGH (ref 29–35)
Reticulocyte Count,Automated: 8 % — ABNORMAL HIGH (ref 0.3–2.0)

## 2022-01-17 LAB — CBC WITH AUTO DIFFERENTIAL
Absolute Eos #: 0.5 10*3/uL (ref 0.0–0.8)
Absolute Immature Granulocyte: 0 10*3/uL (ref 0.0–0.5)
Absolute Lymph #: 2.1 10*3/uL (ref 0.5–4.6)
Absolute Mono #: 0.8 10*3/uL (ref 0.1–1.3)
Basophils Absolute: 0 10*3/uL (ref 0.0–0.2)
Basophils: 0 % (ref 0.0–2.0)
Eosinophils %: 10 % — ABNORMAL HIGH (ref 0.5–7.8)
Hematocrit: 22.8 % — ABNORMAL LOW (ref 35.8–46.3)
Hemoglobin: 8.3 g/dL — ABNORMAL LOW (ref 11.7–15.4)
Immature Granulocytes: 0 % (ref 0.0–5.0)
Lymphocytes: 39 % (ref 13–44)
MCH: 41.9 PG — ABNORMAL HIGH (ref 26.1–32.9)
MCHC: 36.4 g/dL — ABNORMAL HIGH (ref 31.4–35.0)
MCV: 115.2 FL — ABNORMAL HIGH (ref 82–102)
MPV: 9.3 FL — ABNORMAL LOW (ref 9.4–12.3)
Monocytes: 15 % — ABNORMAL HIGH (ref 4.0–12.0)
Platelets: 393 10*3/uL (ref 150–450)
RBC: 1.98 M/uL — ABNORMAL LOW (ref 4.05–5.2)
RDW: 16.7 % — ABNORMAL HIGH (ref 11.9–14.6)
Seg Neutrophils: 36 % — ABNORMAL LOW (ref 43–78)
Segs Absolute: 1.9 10*3/uL (ref 1.7–8.2)
WBC: 5.4 10*3/uL (ref 4.3–11.1)
nRBC: 0.78 10*3/uL — ABNORMAL HIGH (ref 0.0–0.2)

## 2022-01-17 LAB — COMPREHENSIVE METABOLIC PANEL
ALT: 30 U/L (ref 12–65)
AST: 23 U/L (ref 15–37)
Albumin/Globulin Ratio: 0.9 (ref 0.4–1.6)
Albumin: 3.4 g/dL — ABNORMAL LOW (ref 3.5–5.0)
Alk Phosphatase: 88 U/L (ref 50–136)
Anion Gap: 7 mmol/L (ref 2–11)
BUN: 9 MG/DL (ref 6–23)
CO2: 26 mmol/L (ref 21–32)
Calcium: 8.9 MG/DL (ref 8.3–10.4)
Chloride: 107 mmol/L (ref 101–110)
Creatinine: 0.8 MG/DL (ref 0.6–1.0)
Est, Glom Filt Rate: >60 mL/min/{1.73_m2} (ref >60)
Globulin: 3.9 g/dL (ref 2.8–4.5)
Glucose: 102 mg/dL — ABNORMAL HIGH (ref 65–100)
Potassium: 3.8 mmol/L (ref 3.5–5.1)
Sodium: 140 mmol/L (ref 133–143)
Total Bilirubin: 1.3 MG/DL — ABNORMAL HIGH (ref 0.2–1.1)
Total Protein: 7.3 g/dL (ref 6.3–8.2)

## 2022-01-17 LAB — MAGNESIUM: Magnesium: 1.9 mg/dL (ref 1.8–2.4)

## 2022-01-17 MED ORDER — HYDROCODONE-ACETAMINOPHEN 5-325 MG PO TABS
5-325 MG | ORAL_TABLET | Freq: Four times a day (QID) | ORAL | 0 refills | Status: AC | PRN
Start: 2022-01-17 — End: 2022-01-24

## 2022-01-17 MED FILL — HYDROcodone/APAP 5-325MG TAB: 5-325 mg | 6 days supply | Qty: 42 | Fill #0 | Status: AC

## 2022-01-17 MED FILL — HYDROMORPHONE HCL 1 MG/ML IJ SOLN: 1 MG/ML | INTRAMUSCULAR | Qty: 1

## 2022-01-17 MED FILL — LEVOFLOXACIN 500 MG PO TABS: 500 MG | ORAL | Qty: 1

## 2022-01-17 MED FILL — HYDROCODONE-ACETAMINOPHEN 5-325 MG PO TABS: 5-325 MG | ORAL | Qty: 2

## 2022-01-17 MED FILL — FLUOXETINE HCL 20 MG PO CAPS: 20 MG | ORAL | Qty: 2

## 2022-01-17 MED FILL — XARELTO 20 MG PO TABS: 20 MG | ORAL | Qty: 1

## 2022-01-17 MED FILL — LEVETIRACETAM 500 MG PO TABS: 500 MG | ORAL | Qty: 3

## 2022-01-17 MED FILL — HYDROXYUREA 500 MG PO CAPS: 500 MG | ORAL | Qty: 2

## 2022-01-17 MED FILL — DIPHENHYDRAMINE HCL 25 MG PO CAPS: 25 MG | ORAL | Qty: 1

## 2022-01-17 MED FILL — FOLIC ACID 1 MG PO TABS: 1 MG | ORAL | Qty: 1

## 2022-01-17 NOTE — Progress Notes (Signed)
Physician Progress Note      PATIENT:               [name] Bright, [name] Bright  CSN #:                  [id number]  DOB:                       29-Jun-1976  ADMIT DATE:       01/12/2022 12:05 AM  DISCH DATE:        01/17/2022 1:50 PM  RESPONDING  PROVIDER #:        Leeanne Rio FNP          QUERY TEXT:    Pt admitted with sickle cell pain.  Noted documentation of pneumonia in   progress notes in active problem list.  If possible, please document in   progress notes and discharge summary the clinical indicators to support this   diagnosis on current admission or clarify current status of pneumonia    The medical record reflects the following:  Risk Factors: Sinusitis  Clinical Indicators: WBC normal, afebrile   CXR nothing acute, no sob or cough  Treatment: Levaquin po  Thanks Rebeca Alert, BSN  Options provided:  -- Pneumonia is PMH only  -- Pneumonia is current diagnosis as evidenced by##Please specify, Please   specify.  -- Other - I will add my own diagnosis  -- Disagree - Not applicable / Not valid  -- Disagree - Clinically unable to determine / Unknown  -- Refer to Clinical Documentation Reviewer    PROVIDER RESPONSE TEXT:    Pneumonia is PMH only.    Query created by: Rebeca Alert on 01/19/2022 4:43 PM      Electronically signed by:  Leeanne Rio FNP 01/20/2022 6:58 AM

## 2022-01-17 NOTE — Care Coordination-Inpatient (Signed)
Pt is for discharge home today with no needs/supportive care orders received for CM at this time.  Pt will have close follow up at the [facility].    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[place])    CAP (community acquired pneumonia)    Acute chest wall pain    Atopic rhinitis    Gastroesophageal reflux disease    Depression    Superior vena cava occlusion    History of DVT (deep vein thrombosis)    Menorrhagia    Abnormal uterine bleeding    Symptomatic anemia    Sickle cell pain crisis ([place])    Acute sinusitis    Sickle cell crisis [facility])       Service Assessment  Patient Orientation Alert and Oriented   Cognition Alert   History Provided By Patient   Primary Caregiver Self   Accompanied By/Relationship     Support Systems Parent, Family Members   Patient's Healthcare Decision Maker is: Legal Next of [place]   PCP Verified by CM Yes (Dr Crosby Oyster  [phone] and uses [facility] in [place])   Last Visit to PCP Within last 6 months   Prior Functional Level Independent in ADLs/IADLs   Current Functional Level Independent in ADLs/IADLs   Can patient return to prior living arrangement Yes   Ability to make needs known: Good   Family able to assist with home care needs: Yes   Would you like for me to discuss the discharge plan with any other family members/significant others, and if so, who? 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 No   Potential Assistance Purchasing Medications No   Meds-to-Beds: Does the patient want to have any new prescriptions delivered to bedside prior to discharge?     Type of Home Care Services None   Patient expects to be discharged to: House   Follow Up Appointment: Best Day/Time     One/Two Story Residence:     # of Interior Steps     Height of Each Step (in)     Yahoo Available     History of Falls?       Services At/After Discharge  Transition of Care Consult (CM Consult): Discharge Planning   Internal Home Health     Internal Hospice     Reason Outside Agency [facility]     Partner SNF     Reason Why Partner SNF Not Chosen     Internal Comfort Care     Reason Outside [place] Discharge None   Veteran Resource Information  Provided? No   Mode of Transport at Discharge [place] Time of Discharge     Confirm Follow Up Transport Self     Condition of Participation: Discharge Planning  The plan for Transition of Care is related to the following treatment goals: return to baseline with family support   The Patient and/or Patient Representative was provided with a Choice of Provider? Patient   Name of the Patient Representative who was provided with the Choice of Provider and agrees with the Discharge Plan?     The Patient and/or Patient Representative Agree with the Discharge Plan? Yes   Freedom of Choice list was provided with basic dialogue that supports the individualized plan of care/goals, treatment preferences, and shares the quality data associated with the providers? Yes         [place], RN 01/17/22 12:35 PM

## 2022-01-17 NOTE — Discharge Summary (Addendum)
[facility]: Inpatient Hematology / Oncology Discharge Summary Note    Patient ID:  [name] Bright  [id number]  45 y.o.  March 24, 1976    Admit Date: 01/12/2022    Discharge Date: 01/17/2022    Admission Diagnoses: Sickle cell crisis (HCC) [D57.00]  Sickle cell pain crisis (HCC) [D57.00]    Discharge Diagnoses:  Principal Diagnosis: Sickle cell anemia with crisis (HCC)  Principal Problem:    Sickle cell anemia with crisis (HCC)  Active Problems:    Gastroesophageal reflux disease    Depression    History of DVT (deep vein thrombosis)    Sickle cell pain crisis (HCC)    Acute sinusitis    Sickle cell crisis (HCC)  Resolved Problems:    * No resolved hospital problems. [facility] Course:    [name] Bright is a 46 y.o. female admitted on 01/12/2022. The encounter diagnosis was Sickle cell pain crisis (HCC).     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 She is on Hydrea 1000mg  BID, folic acid. She reports she takes norco at home infrequently. She was last seen in ED 12/26/21 with leg pain and has had some visits to ED for pain but last admission for [facility] crisis was July 2022.      [name] Bright presents on day of admission with pain typical of SS crisis pain. She reports she has a great deal of stress in her life as of recently. 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 She has also had several deaths in the family recently. She denies abdominal pain, dyspnea, shortness of breath, cough, urinary issues/pain. She was admitted for further management of sickle cell pain crisis.     She was treated for sinusitis empirically and completed 5 day course of Levaquin. Crisis pain improving. Hgb stable - up to 8.3 today. Retic stable. She feels like she can manage her pain at home and is requesting to be DC'd home. We will arrange for her to FU with Dr Daphine Deutscher or NP in clinic. She knows to call with questions or concerns.      See details of admission below:     Sickle cell pain crisis / HbSS  - Continue Hydrea, folic acid  - Continue aggressive IVF  - Continue pain management - IV dilaudid 1mg  every 3 hrs and norco 5mg  every 6 hrs PRN. Benadryl PO only  - Daily retic  - No other associated symptoms and on RA.   2/10 Retic 6.6.    2/11 Retic 7.2.  Hgb 7.9.  TB up slightly to 1.6 from 1.2.    2/12 Retic 7.8, Hgb 7.5.  TB stable at 1.6.  Decreased IV dilaudid frequency, try oral Norco first.     2/13 Continues to utilize IV pain meds. Norco not effective, will increase dose and include PO indication for severe pain.      Elevated Cr  - Cr 1.2 (most recent baseline 1.0-1.1)  - Continue IVF  2/10 Cr down to 0.8  2/11 Cr stable at 0.8  2/12 Cr stable at 0.8.    RESOLVED     Hx of DVT  - Continue Xarelto     Hx of seizures  - Continue Keppra     Intertriginous candida  - Nystatin powder     ? Sinusitis with drainage/HA  2/10 Start LVQ  2/11 Headaches better now     Right Neck Swelling/Facial Swelling  2/11 Decrease IVFs and check doppler of right neck.  Pt reports swelling has been chronic.   2/12 [place] pending result.    2/13 Unremarkable with exception of thyroid nodule - will need 1 yr follow-up US. No complaint of swelling today.    Consults:  IP CONSULT TO HEMATOLOGY    Pertinent Diagnostic Studies:   Labs:    Recent Labs     01/15/22  0614 01/16/22  0743 01/17/22  0618   WBC 5.8 6.2 5.4   HGB 7.5* 7.6* 8.3*   PLT 328 331 393      Recent Labs     01/15/22  0614 01/16/22  0743 01/17/22  0618   NA 139 140 140   K 3.8 3.5 3.8   CL 109 107 107   CO2 26 26 26    BUN 7 6 9    MG 1.8 1.7* 1.9       Imaging:  See EHR       Medication List        START taking these medications      HYDROcodone-acetaminophen 5-325 MG per tablet  Commonly known as: NORCO  Take 1-2 tablets by mouth every 6 hours as needed for Pain for up to 7 days. Max Daily Amount: 8 tablets            CONTINUE taking these medications      cyclobenzaprine 10 MG tablet  Commonly known  as: FLEXERIL     diphenhydrAMINE-APAP (sleep) 25-500 MG tablet  Commonly known as: TYLENOL PM EXTRA STRENGTH     FLUoxetine 20 MG capsule  Commonly known as: PROZAC     fluticasone 50 MCG/ACT nasal spray  Commonly known as: FLONASE  2 sprays by Each Nostril route in the morning.     folic acid 1 MG tablet  Commonly known as: FOLVITE     hydroxyurea 500 MG chemo capsule  Commonly known as: HYDREA  Take 2 capsules by mouth in the morning.     levETIRAcetam 750 MG tablet  Commonly known as: KEPPRA  Take 2 tablets by mouth in the morning and 2 tablets before bedtime.     ondansetron 8 MG Tbdp disintegrating tablet  Commonly known as: Zofran ODT  Place 0.5-1 tablets under the tongue every 6-8 hours as needed for Nausea or Vomiting     rivaroxaban 20 MG Tabs tablet  Commonly known as: XARELTO            STOP taking these medications      medroxyPROGESTERone 10 MG tablet  Commonly known as: Provera     naproxen 500 MG EC tablet  Commonly known as: EC NAPROSYN     pantoprazole 40 MG tablet  Commonly known as: PROTONIX     potassium chloride 20 MEQ extended release tablet  Commonly known as: KLOR-CON M     vitamin D 1.25 MG (50000 UT) Caps capsule  Commonly known as: ERGOCALCIFEROL            ASK your doctor about these medications      cyanocobalamin 100 MCG tablet               Where to Get Your Medications        These medications were sent to [facility] - [place], [facility] - [address] - [place] - F [phone]  [address] [place] [address] [place]  Phone: [phone]   HYDROcodone-acetaminophen 5-325 MG per tablet         I have reviewed the patient's controlled substance prescription history, as maintained in the [place] prescription monitoring program, so that the prescriptions(s) for a controlled substance can be given.      OBJECTIVE:  Patient Vitals for the past 8 hrs:   BP Temp Temp src Pulse Resp SpO2   01/17/22 1145 99/66 98.1 ??F (36.7 ??C) Oral 77 16 98 %   01/17/22  0806 102/69 98.1 ??F (36.7 ??C) Oral 82 16 100 %   01/17/22 0613 -- -- -- -- 17 --     Temp (24hrs), Avg:98.1 ??F (36.7 ??C), Min:97.9 ??F (36.6 ??C), Max:98.2 ??F (36.8 ??C)    02/14 0701 - 02/14 1900  In: 360 [P.O.:360]  Out: -     Physical Exam:  Constitutional: Well developed, well nourished female in no acute distress, sitting comfortably on the hospital bed.   HEENT: Normocephalic and atraumatic. Oropharynx is clear, mucous membranes are moist.  Extraocular muscles are intact.  Sclerae anicteric. Neck supple without JVD. No thyromegaly present.    Skin Warm and dry.  No bruising and no rash noted.  No erythema.  No pallor.    Respiratory Lungs are clear to auscultation bilaterally without wheezes, rales or rhonchi, normal air exchange without accessory muscle use.    CVS Normal rate, regular rhythm and normal S1 and S2.  No murmurs, gallops, or rubs.   Abdomen Soft, nontender and nondistended, normoactive bowel sounds.  No palpable mass.  No hepatosplenomegaly.   Neuro Grossly nonfocal with no obvious sensory or motor deficits.   MSK Normal range of motion in general.  No edema and no tenderness.   Psych Appropriate mood and affect.        ASSESSMENT:    Principal Problem:    Sickle cell anemia with crisis (HCC)  Active Problems:    Gastroesophageal reflux disease    Depression    History of DVT (deep vein thrombosis)    Sickle cell pain crisis (HCC)    Acute sinusitis    Sickle cell crisis (HCC)  Resolved Problems:    * No resolved hospital problems. *      DISPOSITION:    DC home. FU with Dr Welton Flakes or NP as scheduled (1 week follow-up appt requested).       Over 45 minutes was spent in discharge planning and coordination of care.            Leeanne Rio, APRN - CNP  [facility]  [address]  [place] [address]  Office : [phone]  Fax : [phone]

## 2022-01-17 NOTE — Plan of Care (Signed)
Problem: Discharge Planning  Goal: Discharge to home or other facility with appropriate resources  01/17/2022 1313 by Everlene Balls, RN  Outcome: Completed  01/17/2022 1313 by Everlene Balls, RN  Outcome: Progressing  Flowsheets (Taken 01/17/2022 [phone])  Discharge to home or other facility with appropriate resources: Identify barriers to discharge with patient and caregiver     Problem: Pain  Goal: Verbalizes/displays adequate comfort level or baseline comfort level  01/17/2022 1313 by Everlene Balls, RN  Outcome: Completed  01/17/2022 1313 by Everlene Balls, RN  Outcome: Progressing  Flowsheets (Taken 01/17/2022 0800)  Verbalizes/displays adequate comfort level or baseline comfort level: Encourage patient to monitor pain and request assistance     Problem: Safety - Adult  Goal: Free from fall injury  01/17/2022 1313 by Everlene Balls, RN  Outcome: Completed  01/17/2022 1313 by Everlene Balls, RN  Outcome: Progressing  Flowsheets  Taken 01/17/2022 0806 by Everlene Balls, RN  Free From Fall Injury: Instruct family/caregiver on patient safety  Taken 01/17/2022 0340 by Dwyane Luo, RN  Free From Fall Injury: Instruct family/caregiver on patient safety     Problem: ABCDS Injury Assessment  Goal: Absence of physical injury  01/17/2022 1313 by Everlene Balls, RN  Outcome: Completed  01/17/2022 1313 by Everlene Balls, RN  Outcome: Progressing  Flowsheets  Taken 01/17/2022 0806 by Everlene Balls, RN  Absence of Physical Injury: Implement safety measures based on patient assessment  Taken 01/17/2022 0340 by Dwyane Luo, RN  Absence of Physical Injury: Implement safety measures based on patient assessment

## 2022-01-25 ENCOUNTER — Encounter

## 2022-01-31 ENCOUNTER — Other Ambulatory Visit: Payer: MEDICARE | Primary: Student in an Organized Health Care Education/Training Program

## 2022-01-31 ENCOUNTER — Encounter: Payer: MEDICARE | Attending: Family | Primary: Student in an Organized Health Care Education/Training Program

## 2022-04-19 NOTE — Progress Notes (Signed)
Formatting of this note might be different from the original.  Mammo reminder sent - ML  Electronically signed by Alinda Sierras, RN at 06/15/2022  4:23 PM EDT

## 2022-04-19 NOTE — Progress Notes (Signed)
Formatting of this note might be different from the original.  2nd mammogram reminder and pap reminder sent to pt - ML  Electronically signed by Alinda Sierras, RN at 08/21/2022  2:13 PM EDT

## 2022-05-26 ENCOUNTER — Inpatient Hospital Stay: Admit: 2022-05-26 | Discharge: 2022-05-27 | Disposition: A | Discharge status: Home / Self Care | Payer: MEDICARE

## 2022-05-26 DIAGNOSIS — Z5329 Procedure and treatment not carried out because of patient's decision for other reasons: Secondary | ICD-10-CM

## 2022-05-26 NOTE — ED Triage Notes (Signed)
Per patient sickle cell crisis. States lower extremity pain. Lumbar portion back pain. Left arm numbness x3 months. (Sensation and strength present). Denies gi/gu complications. Denies fever/chills.

## 2022-05-27 ENCOUNTER — Emergency Department: Admit: 2022-05-27 | Payer: MEDICARE | Primary: Student in an Organized Health Care Education/Training Program

## 2022-05-27 ENCOUNTER — Inpatient Hospital Stay
Admit: 2022-05-27 | Discharge: 2022-06-02 | Disposition: A | Discharge status: Home / Self Care | Payer: MEDICARE | Admitting: Hematology & Oncology

## 2022-05-27 DIAGNOSIS — D5700 Hb-SS disease with crisis, unspecified: Principal | ICD-10-CM

## 2022-05-27 LAB — CBC WITH AUTO DIFFERENTIAL
Absolute Immature Granulocyte: 0 10*3/uL (ref 0.0–0.5)
Basophils %: 1 % (ref 0.0–2.0)
Basophils Absolute: 0 10*3/uL (ref 0.0–0.2)
Eosinophils %: 1 % (ref 0.5–7.8)
Eosinophils Absolute: 0 10*3/uL (ref 0.0–0.8)
Hematocrit: 24.4 % — ABNORMAL LOW (ref 35.8–46.3)
Hemoglobin: 8.8 g/dL — ABNORMAL LOW (ref 11.7–15.4)
Immature Granulocytes: 1 % (ref 0.0–5.0)
Lymphocytes %: 23 % (ref 13–44)
Lymphocytes Absolute: 0.9 10*3/uL (ref 0.5–4.6)
MCH: 44 PG — ABNORMAL HIGH (ref 26.1–32.9)
MCHC: 36.1 g/dL — ABNORMAL HIGH (ref 31.4–35.0)
MCV: 122 FL — ABNORMAL HIGH (ref 82–102)
MPV: 9.3 FL — ABNORMAL LOW (ref 9.4–12.3)
Monocytes %: 10 % (ref 4.0–12.0)
Monocytes Absolute: 0.4 10*3/uL (ref 0.1–1.3)
Neutrophils %: 65 % (ref 43–78)
Neutrophils Absolute: 2.6 10*3/uL (ref 1.7–8.2)
Platelets: 406 10*3/uL (ref 150–450)
RBC: 2 M/uL — ABNORMAL LOW (ref 4.05–5.2)
RDW: 17.2 % — ABNORMAL HIGH (ref 11.9–14.6)
WBC: 4.1 10*3/uL — ABNORMAL LOW (ref 4.3–11.1)
nRBC: 1.03 10*3/uL — ABNORMAL HIGH (ref 0.0–0.2)

## 2022-05-27 LAB — COMPREHENSIVE METABOLIC PANEL
ALT: 17 U/L (ref 12–65)
AST: 22 U/L (ref 15–37)
Albumin/Globulin Ratio: 0.9 (ref 0.4–1.6)
Albumin: 4.1 g/dL (ref 3.5–5.0)
Alk Phosphatase: 104 U/L (ref 50–136)
Anion Gap: 5 mmol/L (ref 2–11)
BUN: 8 MG/DL (ref 6–23)
CO2: 24 mmol/L (ref 21–32)
Calcium: 9.4 MG/DL (ref 8.3–10.4)
Chloride: 109 mmol/L (ref 101–110)
Creatinine: 1 MG/DL (ref 0.6–1.0)
Est, Glom Filt Rate: >60 mL/min/{1.73_m2} (ref >60)
Globulin: 4.6 g/dL — ABNORMAL HIGH (ref 2.8–4.5)
Glucose: 102 mg/dL — ABNORMAL HIGH (ref 65–100)
Potassium: 4.1 mmol/L (ref 3.5–5.1)
Sodium: 138 mmol/L (ref 133–143)
Total Bilirubin: 1.2 MG/DL — ABNORMAL HIGH (ref 0.2–1.1)
Total Protein: 8.7 g/dL — ABNORMAL HIGH (ref 6.3–8.2)

## 2022-05-27 LAB — MAGNESIUM: Magnesium: 2.2 mg/dL (ref 1.8–2.4)

## 2022-05-27 LAB — LACTATE DEHYDROGENASE: LD: 275 U/L — ABNORMAL HIGH (ref 100–190)

## 2022-05-27 LAB — RETICULOCYTES
Absolute Retic #: 0.094 M/ul (ref 0.026–0.095)
Immature Retic Fraction: 22.4 % — ABNORMAL HIGH (ref 3.0–15.9)
Retic Hemoglobin conc.: 38 pg — ABNORMAL HIGH (ref 29–35)
Reticulocyte Count,Automated: 4.7 % — ABNORMAL HIGH (ref 0.3–2.0)

## 2022-05-27 MED ORDER — ONDANSETRON HCL 4 MG/2ML IJ SOLN
4 MG/2ML | INTRAMUSCULAR | Status: AC
Start: 2022-05-27 — End: 2022-05-27
  Administered 2022-05-27: 18:00:00 4 mg via INTRAVENOUS

## 2022-05-27 MED ORDER — CYCLOBENZAPRINE HCL 10 MG PO TABS
10 MG | Freq: Two times a day (BID) | ORAL | Status: AC | PRN
Start: 2022-05-27 — End: 2022-06-02

## 2022-05-27 MED ORDER — HYDROXYUREA 500 MG PO CAPS
500 MG | Freq: Every day | ORAL | Status: AC
Start: 2022-05-27 — End: 2022-06-02
  Administered 2022-05-28 – 2022-06-02 (×6): 1000 mg via ORAL

## 2022-05-27 MED ORDER — RIVAROXABAN 20 MG PO TABS
20 MG | Freq: Every day | ORAL | Status: AC
Start: 2022-05-27 — End: 2022-06-02
  Administered 2022-05-28 – 2022-06-02 (×6): 20 mg via ORAL

## 2022-05-27 MED ORDER — HYDROMORPHONE HCL PF 1 MG/ML IJ SOLN
1 MG/ML | Freq: Once | INTRAMUSCULAR | Status: AC
Start: 2022-05-27 — End: 2022-05-27
  Administered 2022-05-27: 18:00:00 1 mg via INTRAVENOUS

## 2022-05-27 MED ORDER — SODIUM CHLORIDE 0.9 % IV SOLN
0.9 % | INTRAVENOUS | Status: AC | PRN
Start: 2022-05-27 — End: 2022-06-02

## 2022-05-27 MED ORDER — ONDANSETRON 4 MG PO TBDP
4 MG | Freq: Three times a day (TID) | ORAL | Status: AC | PRN
Start: 2022-05-27 — End: 2022-06-02

## 2022-05-27 MED ORDER — FLUOXETINE HCL 20 MG PO CAPS
20 MG | Freq: Every day | ORAL | Status: AC
Start: 2022-05-27 — End: 2022-06-02
  Administered 2022-05-28 – 2022-06-02 (×6): 40 mg via ORAL

## 2022-05-27 MED ORDER — DIPHENHYDRAMINE HCL 25 MG PO CAPS
25 MG | ORAL | Status: AC
Start: 2022-05-27 — End: 2022-05-27
  Administered 2022-05-27: 19:00:00 25 mg via ORAL

## 2022-05-27 MED ORDER — ONDANSETRON HCL 4 MG/2ML IJ SOLN
4 MG/2ML | Freq: Four times a day (QID) | INTRAMUSCULAR | Status: AC | PRN
Start: 2022-05-27 — End: 2022-06-02
  Administered 2022-05-28: 4 mg via INTRAVENOUS

## 2022-05-27 MED ORDER — IOPAMIDOL 76 % IV SOLN
76 % | Freq: Once | INTRAVENOUS | Status: AC | PRN
Start: 2022-05-27 — End: 2022-05-27
  Administered 2022-05-27: 22:00:00 100 mL via INTRAVENOUS

## 2022-05-27 MED ORDER — ACETAMINOPHEN 650 MG RE SUPP
650 | Freq: Four times a day (QID) | RECTAL | Status: DC | PRN
Start: 2022-05-27 — End: 2022-06-02

## 2022-05-27 MED ORDER — FOLIC ACID 1 MG PO TABS
1 MG | Freq: Every day | ORAL | Status: AC
Start: 2022-05-27 — End: 2022-06-02
  Administered 2022-05-28 – 2022-06-02 (×7): 1 mg via ORAL

## 2022-05-27 MED ORDER — ACETAMINOPHEN 325 MG PO TABS
325 | Freq: Four times a day (QID) | ORAL | Status: DC | PRN
Start: 2022-05-27 — End: 2022-06-02

## 2022-05-27 MED ORDER — HYDROMORPHONE HCL PF 1 MG/ML IJ SOLN
1 MG/ML | INTRAMUSCULAR | Status: AC | PRN
Start: 2022-05-27 — End: 2022-05-28
  Administered 2022-05-28 (×2): 0.25 mg via INTRAVENOUS

## 2022-05-27 MED ORDER — ONDANSETRON HCL 4 MG/2ML IJ SOLN
4 MG/2ML | INTRAMUSCULAR | Status: AC
Start: 2022-05-27 — End: 2022-05-27
  Administered 2022-05-27: 21:00:00 4 mg via INTRAVENOUS

## 2022-05-27 MED ORDER — FLUTICASONE PROPIONATE 50 MCG/ACT NA SUSP
50 MCG/ACT | Freq: Every day | NASAL | Status: AC
Start: 2022-05-27 — End: 2022-06-02
  Administered 2022-05-28 – 2022-06-01 (×6): 2 via NASAL

## 2022-05-27 MED ORDER — NORMAL SALINE FLUSH 0.9 % IV SOLN
0.9 % | INTRAVENOUS | Status: AC | PRN
Start: 2022-05-27 — End: 2022-06-02

## 2022-05-27 MED ORDER — HYDROCODONE-ACETAMINOPHEN 5-325 MG PO TABS
5-325 | Freq: Four times a day (QID) | ORAL | Status: DC | PRN
Start: 2022-05-27 — End: 2022-05-31
  Administered 2022-05-28 – 2022-05-31 (×6): 1 via ORAL

## 2022-05-27 MED ORDER — NORMAL SALINE FLUSH 0.9 % IV SOLN
0.9 % | Freq: Two times a day (BID) | INTRAVENOUS | Status: AC
Start: 2022-05-27 — End: 2022-06-02
  Administered 2022-05-28 – 2022-06-02 (×11): 10 mL via INTRAVENOUS

## 2022-05-27 MED ORDER — HYDROMORPHONE HCL PF 1 MG/ML IJ SOLN
1 MG/ML | INTRAMUSCULAR | Status: AC
Start: 2022-05-27 — End: 2022-05-27
  Administered 2022-05-27: 21:00:00 1 mg via INTRAVENOUS

## 2022-05-27 MED ORDER — POLYETHYLENE GLYCOL 3350 17 G PO PACK
17 g | Freq: Every day | ORAL | Status: AC | PRN
Start: 2022-05-27 — End: 2022-06-02

## 2022-05-27 MED ORDER — MORPHINE SULFATE 2 MG/ML IJ SOLN
2 | INTRAMUSCULAR | Status: DC | PRN
Start: 2022-05-27 — End: 2022-05-27

## 2022-05-27 MED ORDER — LEVETIRACETAM 500 MG PO TABS
500 MG | Freq: Two times a day (BID) | ORAL | Status: AC
Start: 2022-05-27 — End: 2022-06-02
  Administered 2022-05-28 – 2022-06-02 (×12): 1500 mg via ORAL

## 2022-05-27 MED FILL — ONDANSETRON HCL 4 MG/2ML IJ SOLN: 4 MG/2ML | INTRAMUSCULAR | Qty: 2

## 2022-05-27 MED FILL — HYDROMORPHONE HCL 1 MG/ML IJ SOLN: 1 MG/ML | INTRAMUSCULAR | Qty: 1

## 2022-05-27 MED FILL — DIPHENHYDRAMINE HCL 25 MG PO CAPS: 25 MG | ORAL | Qty: 1

## 2022-05-27 NOTE — ED Notes (Signed)
TRANSFER - OUT REPORT:    Verbal report given to 5th floor RN on [name] Bright  being transferred to 504 for routine progression of patient care       Report consisted of patient's Situation, Background, Assessment and   Recommendations(SBAR).     Information from the following report(s) Nurse Handoff Report and ED SBAR was reviewed with the receiving nurse.    [place]inder Fall Assessment:                           Lines:   Peripheral IV 05/27/22 Left Antecubital (Active)   Site Assessment Clean, dry & intact 05/27/22 1259        Opportunity for questions and clarification was provided.      Patient transported with:  Transport          Darrall Dears, RN  05/27/22 1845

## 2022-05-27 NOTE — ED Triage Notes (Addendum)
Pt arrives ambulatory to triage with c/o pain to pain to left lower back and bilateral legs since April. Endorses for the past week she has had pain and intermittent numbness to her left arm. States she has had shortness of breath even with rest over the last several days. Has hx of sickle cell. Denies chest pain/discomfort, fever, or chills. States she was at this ER yesterday but left before tx.

## 2022-05-27 NOTE — ED Provider Notes (Cosign Needed)
Emergency Department Provider Note                   PCP:                [name] Raider, DO               Age: 46 y.o.      Sex: female     DISPOSITION Decision To Admit 05/27/2022 05:58:19 PM       ICD-10-CM    1. Sickle cell crisis (HCC)  D57.00           MEDICAL DECISION MAKING  Complexity of Problems Addressed:  Complexity of Problem: 1 chronic illness with exacerbation.    Data Reviewed and Analyzed:  Category 1:   I reviewed external records: ED visit note from an outside group.  I reviewed external records: provider visit note from PCP.  I reviewed external records: provider visit note from outside specialist.  I reviewed external records: previous lab results from outside ED.  I ordered each unique test.  I reviewed the results of each unique test.      Category 2:   I interpreted the X-rays.  And agree there is no evidence of pneumothorax  I interpreted the CT Scan.  And agree there is no evidence of intracranial abnormality    Category 3: Discussion of management or test interpretation.     46 year old female with past medical history of sickle cell, seizures, DVT, PE, splenic infarct, SVC thrombosis presents complaining of a sickle cell crisis.  She is complaining of lumbar back pain and bilateral leg pain consistent with previous crises.  She is complaining of left upper extremity numbness for the past 2 months.  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 She is on Hydrea 1000mg  BID, folic acid.  On presentation, patient is afebrile, vital signs are stable, and she is well-appearing in no acute distress.  She does exhibit soft tissue tenderness to palpation diffusely in her lumbar back and lower extremities.  Neurologically intact with no evidence of motor weakness, cerebellar dysfunction.  Does states she is able to feel in her left upper extremity but sensation is decreased when compared to her right arm.   CBC revealed evidence of anemia but is stable compared to patient's baseline.  Reticulocyte count  and LDH are mildly elevated but not as high as some of her previous visits for crises.  CT head was obtained given patient's left arm numbness which did not reveal any evidence of intracranial abnormalities.  Chest x-ray was unremarkable.  Will give patient Dilaudid, Benadryl, and Zofran to attempt to provide him pain control for her sickle cell crisis.  Case was discussed with my attending Dr. Thomasena Edis who also agreed with this patient's plan of care.  We will attempt to control her pain in the ED today, if we are on successful, we will offer patient admission.  Patient given 2 doses of Dilaudid without improvement in her pain.  Patient does not feel able to control her pain if she is discharged at this time.    Spoke with Dr. Wilson Singer who agreed to accept this patient.    Risk of Complications and/or Morbidity of Patient Management:  Prescription drug management performed, Discussion with external consultants, and Shared medical decision making was utilized in creating the patients health plan today.    [name] Bright is a 46 y.o. 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  She is complaining of lumbar back pain and bilateral leg pain consistent with previous crises.  She is complaining of left upper extremity numbness for the past 2 months.  Patient did come to our facility yesterday but left due to the long wait time.  Today she states she is having continued pain in her lumbar back and bilateral lower extremities.  She also notes associated left-sided numbness/decreased sensation for the past 2 months.  Denies any weakness, headache, dizziness, ataxia, difficulty ambulating, facial droop, slurred speech, or any other neurologic deficits.  Patient has had previous  strokes but states the symptoms do not feel consistent.  She does take Xarelto daily and has been compliant with her medications.  Denies any calf pain or tenderness, denies any swelling, or any signs of DVT.  Denies any shortness of breath or chest pain.  Denies any abdominal pain, nausea, or vomiting.  Patient is a primary historian and the quality of the history appears reliable.  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 She is on Hydrea 1000mg  BID, folic acid.    The history is provided by the patient. No language interpreter was used.      Vitals signs and nursing note reviewed.   Patient Vitals for the past 4 hrs:   BP SpO2   05/27/22 1718 104/64 97 %   05/27/22 1433 106/65 98 %   05/27/22 1418 114/76 98 %   05/27/22 1403 107/71 97 %        Physical Exam  Constitutional:       General: She is not in acute distress.     Appearance: Normal appearance. She is not ill-appearing.   HENT:      Head: Normocephalic and atraumatic.      Right Ear: Tympanic membrane, ear canal and external ear normal. There is no impacted cerumen.      Left Ear: Tympanic membrane, ear canal and external ear normal. There is no impacted cerumen.      Nose: Nose normal.      Mouth/Throat:      Pharynx: Oropharynx is clear.   Eyes:      General: No scleral icterus.     Extraocular Movements: Extraocular movements intact.      Conjunctiva/sclera: Conjunctivae normal.      Pupils: Pupils are equal, round, and reactive to light.   Cardiovascular:      Rate and Rhythm: Normal rate and regular rhythm.      Heart sounds: Normal heart sounds. No murmur heard.    No friction rub. No gallop.   Pulmonary:      Effort: Pulmonary effort is normal. No respiratory distress.      Breath sounds: Normal breath sounds. No stridor. No wheezing, rhonchi or rales.   Chest:      Chest wall: No tenderness.   Abdominal:      General: Abdomen is flat. Bowel sounds are normal. There is no distension.      Palpations: Abdomen is soft. There is no mass.       Tenderness: There is no abdominal tenderness. There is no right CVA tenderness, left CVA tenderness, guarding or rebound.      Hernia: No hernia is present.   Musculoskeletal:  General: No swelling, deformity or signs of injury. Normal range of motion.      Cervical back: Normal range of motion. No rigidity, spasms, tenderness or bony tenderness. No pain with movement.      Thoracic back: No tenderness or bony tenderness. Normal range of motion.      Lumbar back: Tenderness present. No bony tenderness. Normal range of motion. Negative right straight leg raise test and negative left straight leg raise test.      Right lower leg: No edema.      Left lower leg: No edema.      Comments: Diffuse soft tissue tenderness to the lumbar spine  No saddle anesthesia or loss of bowel or bladder function  No calf pain or tenderness, homan sign negative   Skin:     General: Skin is warm.      Capillary Refill: Capillary refill takes less than 2 seconds.      Coloration: Skin is not jaundiced or pale.   Neurological:      General: No focal deficit present.      Mental Status: She is alert and oriented to person, place, and time.        Procedures     Orders Placed This Encounter   Procedures    XR CHEST PORTABLE    CT HEAD WO CONTRAST    CBC with Auto Differential    Comprehensive Metabolic Panel    Reticulocytes    Lactate Dehydrogenase    Magnesium    POCT Urine Dipstick    POC PREGNANCY UR-QUAL    POCT Urinalysis no Micro        Medications   ondansetron (ZOFRAN) injection 4 mg (4 mg IntraVENous Given 05/27/22 1355)   HYDROmorphone HCl PF (DILAUDID) injection 1 mg (1 mg IntraVENous Given 05/27/22 1355)   diphenhydrAMINE (BENADRYL) capsule 25 mg (25 mg Oral Given 05/27/22 1432)   HYDROmorphone HCl PF (DILAUDID) injection 1 mg (1 mg IntraVENous Given 05/27/22 1631)   ondansetron (ZOFRAN) injection 4 mg (4 mg IntraVENous Given 05/27/22 1632)       New Prescriptions    No medications on file        Past Medical History:    Diagnosis Date    Seizures (HCC)     Sickle cell disease (HCC)     Stroke [facility])         Past Surgical History:   Procedure Laterality Date    CESAREAN SECTION      LAPAROTOMY      TUBAL LIGATION          Results for orders placed or performed during the hospital encounter of 05/27/22   XR CHEST PORTABLE    Narrative    Portable chest x-ray    Clinical indications: Numbness in the left arm    FINDINGS: Single AP view the chest compared to a similar exam dated 12/29/2021  show the lungs to be expanded and clear. No pleural effusion or pneumothorax.  The cardiac silhouette and mediastinum are unremarkable. The bones are  unremarkable.      Impression    No acute cardiopulmonary abnormality.   CT HEAD WO CONTRAST    Narrative    CT of the head without contrast.    CLINICAL INDICATION: Left arm numbness for one month    PROCEDURE: Serial thin section axial images are obtained from the cranial vertex  through the skull base without the administration of intravenous contrast.   Radiation dose reduction  techniques were used for this study. Our CT scanners  use one or all of the following: Automated exposure control, adjusted of the mA  and/or kV according to patient size, iterative reconstruction    COMPARISON: No prior.    FINDINGS: There is no acute intracranial hemorrhage, mass, or mass effect. No  abnormal extra-axial fluid collections identified. There is no hydrocephalus.  The basilar cisterns are widely patent. The gray-white matter brain parenchymal  interface is well-maintained. No skull fracture or aggressive osseous lesion  noted. The mastoid air cells and included paranasal sinuses are clear.      Impression    Normal head CT        CBC with Auto Differential   Result Value Ref Range    WBC 4.1 (L) 4.3 - 11.1 K/uL    RBC 2.00 (L) 4.05 - 5.2 M/uL    Hemoglobin 8.8 (L) 11.7 - 15.4 g/dL    Hematocrit 16.1 (L) 35.8 - 46.3 %    MCV 122.0 (H) 82 - 102 FL    MCH 44.0 (H) 26.1 - 32.9 PG    MCHC 36.1 (H) 31.4 - 35.0  g/dL    RDW 09.6 (H) 04.5 - 14.6 %    Platelets 406 150 - 450 K/uL    MPV 9.3 (L) 9.4 - 12.3 FL    nRBC 1.03 (H) 0.0 - 0.2 K/uL    Differential Type AUTOMATED      Neutrophils % 65 43 - 78 %    Lymphocytes % 23 13 - 44 %    Monocytes % 10 4.0 - 12.0 %    Eosinophils % 1 0.5 - 7.8 %    Basophils % 1 0.0 - 2.0 %    Immature Granulocytes 1 0.0 - 5.0 %    Neutrophils Absolute 2.6 1.7 - 8.2 K/UL    Lymphocytes Absolute 0.9 0.5 - 4.6 K/UL    Monocytes Absolute 0.4 0.1 - 1.3 K/UL    Eosinophils Absolute 0.0 0.0 - 0.8 K/UL    Basophils Absolute 0.0 0.0 - 0.2 K/UL    Absolute Immature Granulocyte 0.0 0.0 - 0.5 K/UL   Comprehensive Metabolic Panel   Result Value Ref Range    Sodium 138 133 - 143 mmol/L    Potassium 4.1 3.5 - 5.1 mmol/L    Chloride 109 101 - 110 mmol/L    CO2 24 21 - 32 mmol/L    Anion Gap 5 2 - 11 mmol/L    Glucose 102 (H) 65 - 100 mg/dL    BUN 8 6 - 23 MG/DL    Creatinine 4.09 0.6 - 1.0 MG/DL    Est, Glom Filt Rate >60 >60 ml/min/1.47m2    Calcium 9.4 8.3 - 10.4 MG/DL    Total Bilirubin 1.2 (H) 0.2 - 1.1 MG/DL    ALT 17 12 - 65 U/L    AST 22 15 - 37 U/L    Alk Phosphatase 104 50 - 136 U/L    Total Protein 8.7 (H) 6.3 - 8.2 g/dL    Albumin 4.1 3.5 - 5.0 g/dL    Globulin 4.6 (H) 2.8 - 4.5 g/dL    Albumin/Globulin Ratio 0.9 0.4 - 1.6     Reticulocytes   Result Value Ref Range    Reticulocyte Count,Automated 4.7 (H) 0.3 - 2.0 %    Absolute Retic # 0.0940 0.026 - 0.095 M/ul    Immature Retic Fraction 22.4 (H) 3.0 - 15.9 %    Retic Hemoglobin conc.  38 (H) 29 - 35 pg   Lactate Dehydrogenase   Result Value Ref Range    LD 275 (H) 100 - 190 U/L   Magnesium   Result Value Ref Range    Magnesium 2.2 1.8 - 2.4 mg/dL        XR CHEST PORTABLE   Final Result   No acute cardiopulmonary abnormality.      CT HEAD WO CONTRAST   Final Result   Normal head CT                   Voice dictation software was used during the making of this note.  This software is not perfect and grammatical and other typographical errors may be  present.  This note has not been completely proofread for errors.     Vanessa Durham, [place]  05/27/22 1802

## 2022-05-27 NOTE — Progress Notes (Signed)
Faith unknown  Lives in [place]  Father -  johnny  Full code  My chart active

## 2022-05-27 NOTE — H&P (Addendum)
Hospitalist History and Physical   Admit Date:  05/27/2022 12:37 PM   Name:  [name] Bright   Age:  46 y.o.  Sex:  female  DOB:  March 31, 1976   MRN:  [id number]   Room:  504/01    Presenting/Chief Complaint: Arm Pain     Reason(s) for Admission: Sickle cell crisis ([place]) [D57.00]     History of Present Illness:   [name] Bright is a 46 y.o. female with medical history of  seizures, DVT, PE, splenic infarct, SVC thrombosis. who presented with report of diffuse back pain, pain in top of leg, bilateral hips and bilateral shoulders and intermittent numbness of LUE w/o weakness. Says this is not atypical for her pain crisis. States symptoms present since April but progressive in last one week. Has not been taking any pain meds since she took a trip to [place] in April.   Denies nausea,vomiting at home. No fevers, chills, sick contacts. Urine and bowel habtis normal. No abdominal pain.       CT chest negative for PE. Does show 80m LUL nodule ?old vs new.   CT cervical spine - non acute    Assessment & Plan:     # Sickle cell crisis  - cont hydrea, folic acid  - Aggressive IVF  - prn norco 5 for moderate, prn dilaudid IV for severe pain  - daily retic counts  - consult to oncology, pt known to them    # Hx of DVT  - continue xarelto 240mdaily    # hx of seizures  - resume Keppra    # ?SVC syndrome   - LUE intermittent numbness.  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       PT/OT evals and PPD needed/ordered?  No  Diet: ADULT DIET; Regular  VTE prophylaxis: Already on anticoagulation  Code status: Full Code    Hospital Problems:  Principal Problem:    Sickle cell crisis (HCCPageActive Problems:    Migraines  Resolved Problems:    * No  resolved hospital problems. *       Past History:     Past Medical History:   Diagnosis Date    Arthritis     Hx of blood clots     Migraines     Seizures (HCC)     Sickle cell disease (HCC)     [facility]      Past Surgical History:   Procedure Laterality Date    CESAREAN SECTION      CHOLECYSTECTOMY      LAPAROTOMY      TUBAL LIGATION      VASCULAR SURGERY          Social History     Tobacco Use    Smoking status: Never    Smokeless tobacco: Never   Substance Use Topics    Alcohol use: Not Currently      Social History     Substance and Sexual Activity   Drug Use Never       Family History   Problem Relation Age of Onset    Diabetes  Mother     Hypertension Mother     Diabetes Father     Hypertension Father           There is no immunization history on file for this patient.  Allergies   Allergen Reactions    Latex Rash    Ceftriaxone Other (See Comments)    Fentanyl Other (See Comments)     "my doctor said I had a stroke from it"    Influenza Vaccines Itching     unknown      Influenza Virus Vaccine Swelling    Meperidine Other (See Comments)    Morphine Itching    Oxycodone Other (See Comments)    Sulfa Antibiotics Rash     Prior to Admit Medications:  Current Outpatient Medications   Medication Instructions    cyanocobalamin 100 mcg, DAILY    cyclobenzaprine (FLEXERIL) 10 MG tablet No dose, route, or frequency recorded.    diphenhydrAMINE (SOMINEX) 25 mg, Oral    diphenhydrAMINE-APAP, sleep, (TYLENOL PM EXTRA STRENGTH) 25-500 MG tablet 1 tablet, Oral, DAILY    fexofenadine (ALLEGRA) 180 mg, Oral, DAILY    FLUoxetine (PROZAC) 40 MG capsule No dose, route, or frequency recorded.    FLUoxetine (PROZAC) 40 mg, Oral, DAILY    fluticasone (FLONASE) 50 MCG/ACT nasal spray 2 sprays, Each Nostril, DAILY    folic acid (FOLVITE) 1 mg, Oral, DAILY    hydroxyurea (HYDREA) 1,000 mg, Oral, DAILY    lacosamide (VIMPAT) 100 mg, 2 TIMES DAILY    levETIRAcetam (KEPPRA) 1,500 mg, Oral, 2 TIMES DAILY    ondansetron (ZOFRAN ODT)  4-8 mg, SubLINGual, EVERY 6-8 HOURS PRN    rivaroxaban (XARELTO) 20 mg, Oral, DAILY WITH BREAKFAST         Objective:   Patient Vitals for the past 24 hrs:   Temp Pulse Resp BP SpO2   05/27/22 2158 -- -- 18 -- --   05/27/22 1959 97.9 F (36.6 C) 74 24 122/79 98 %   05/27/22 1718 -- -- -- 104/64 97 %   05/27/22 1433 -- -- -- 106/65 98 %   05/27/22 1418 -- -- -- 114/76 98 %   05/27/22 1403 -- -- -- 107/71 97 %   05/27/22 1348 -- -- -- 107/73 99 %   05/27/22 1226 98.2 F (36.8 C) 79 17 124/83 100 %       Oxygen Therapy  SpO2: 98 %  Pulse via Oximetry: 90 beats per minute  O2 Device: None (Room air)    Estimated body mass index is 25.54 kg/m as calculated from the following:    Height as of this encounter: '5\' 8"'$  (1.727 m).    Weight as of this encounter: 168 lb (76.2 kg).  No intake or output data in the 24 hours ending 05/28/22 0003      Physical Exam:    General:    Well nourished.  BMI 25 drowsy secondary to pain medications during eval  Head:  Normocephalic, atraumatic  Eyes:  Sclerae appear normal.  Pupils equally round.  ENT:  Nares appear normal.  Moist oral mucosa  Neck:  No restricted ROM.  Trachea midline   CV:   RRR.  No m/r/g.  No jugular venous distension.  Lungs:   CTAB.  No wheezing, rhonchi, or rales.  Symmetric expansion.  Abdomen:   Soft, nontender, nondistended.  Extremities: No cyanosis or clubbing.  No edema  Skin:     No rashes and normal coloration.   Warm  and dry.    Neuro:  CN II-XII grossly intact.  Sensation intact.   Psych:  Normal mood and affect.      Orders Placed This Encounter   Medications    ondansetron (ZOFRAN) injection 4 mg    HYDROmorphone HCl PF (DILAUDID) injection 1 mg    diphenhydrAMINE (BENADRYL) capsule 25 mg    HYDROmorphone HCl PF (DILAUDID) injection 1 mg    ondansetron (ZOFRAN) injection 4 mg    iopamidol (ISOVUE-370) 76 % injection 100 mL    HYDROcodone-acetaminophen (NORCO) 5-325 MG per tablet 1 tablet    DISCONTD: morphine injection 2 mg    HYDROmorphone HCl PF  (DILAUDID) injection 0.25 mg    sodium chloride flush 0.9 % injection 5-40 mL    sodium chloride flush 0.9 % injection 5-40 mL    0.9 % sodium chloride infusion    OR Linked Order Group     ondansetron (ZOFRAN-ODT) disintegrating tablet 4 mg     ondansetron (ZOFRAN) injection 4 mg    polyethylene glycol (GLYCOLAX) packet 17 g    OR Linked Order Group     acetaminophen (TYLENOL) tablet 650 mg     acetaminophen (TYLENOL) suppository 650 mg    cyclobenzaprine (FLEXERIL) tablet 10 mg    FLUoxetine (PROZAC) capsule 40 mg    fluticasone (FLONASE) 50 MCG/ACT nasal spray 2 spray    folic acid (FOLVITE) tablet 1 mg    hydroxyurea (HYDREA) chemo capsule 1,000 mg    levETIRAcetam (KEPPRA) tablet 1,500 mg    rivaroxaban (XARELTO) tablet 20 mg     Order Specific Question:   Indication of Use     Answer:   History of DVT/PE (indefinite)       I have personally reviewed labs and tests:  Recent Labs:  Recent Results (from the past 24 hour(s))   CBC with Auto Differential    Collection Time: 05/27/22 12:59 PM   Result Value Ref Range    WBC 4.1 (L) 4.3 - 11.1 K/uL    RBC 2.00 (L) 4.05 - 5.2 M/uL    Hemoglobin 8.8 (L) 11.7 - 15.4 g/dL    Hematocrit 24.4 (L) 35.8 - 46.3 %    MCV 122.0 (H) 82 - 102 FL    MCH 44.0 (H) 26.1 - 32.9 PG    MCHC 36.1 (H) 31.4 - 35.0 g/dL    RDW 17.2 (H) 11.9 - 14.6 %    Platelets 406 150 - 450 K/uL    MPV 9.3 (L) 9.4 - 12.3 FL    nRBC 1.03 (H) 0.0 - 0.2 K/uL    Differential Type AUTOMATED      Neutrophils % 65 43 - 78 %    Lymphocytes % 23 13 - 44 %    Monocytes % 10 4.0 - 12.0 %    Eosinophils % 1 0.5 - 7.8 %    Basophils % 1 0.0 - 2.0 %    Immature Granulocytes 1 0.0 - 5.0 %    Neutrophils Absolute 2.6 1.7 - 8.2 K/UL    Lymphocytes Absolute 0.9 0.5 - 4.6 K/UL    Monocytes Absolute 0.4 0.1 - 1.3 K/UL    Eosinophils Absolute 0.0 0.0 - 0.8 K/UL    Basophils Absolute 0.0 0.0 - 0.2 K/UL    Absolute Immature Granulocyte 0.0 0.0 - 0.5 K/UL   Comprehensive Metabolic Panel    Collection Time: 05/27/22 12:59 PM    Result Value Ref Range    Sodium 138 133 -  143 mmol/L    Potassium 4.1 3.5 - 5.1 mmol/L    Chloride 109 101 - 110 mmol/L    CO2 24 21 - 32 mmol/L    Anion Gap 5 2 - 11 mmol/L    Glucose 102 (H) 65 - 100 mg/dL    BUN 8 6 - 23 MG/DL    Creatinine 1.00 0.6 - 1.0 MG/DL    Est, Glom Filt Rate >60 >60 ml/min/1.60m    Calcium 9.4 8.3 - 10.4 MG/DL    Total Bilirubin 1.2 (H) 0.2 - 1.1 MG/DL    ALT 17 12 - 65 U/L    AST 22 15 - 37 U/L    Alk Phosphatase 104 50 - 136 U/L    Total Protein 8.7 (H) 6.3 - 8.2 g/dL    Albumin 4.1 3.5 - 5.0 g/dL    Globulin 4.6 (H) 2.8 - 4.5 g/dL    Albumin/Globulin Ratio 0.9 0.4 - 1.6     Reticulocytes    Collection Time: 05/27/22 12:59 PM   Result Value Ref Range    Reticulocyte Count,Automated 4.7 (H) 0.3 - 2.0 %    Absolute Retic # 0.0940 0.026 - 0.095 M/ul    Immature Retic Fraction 22.4 (H) 3.0 - 15.9 %    Retic Hemoglobin conc. 38 (H) 29 - 35 pg   Lactate Dehydrogenase    Collection Time: 05/27/22 12:59 PM   Result Value Ref Range    LD 275 (H) 100 - 190 U/L   Magnesium    Collection Time: 05/27/22 12:59 PM   Result Value Ref Range    Magnesium 2.2 1.8 - 2.4 mg/dL       I have personally reviewed imaging studies:  CT HEAD WO CONTRAST    Result Date: 05/27/2022  CT of the head without contrast. CLINICAL INDICATION: Left arm numbness for one month PROCEDURE: Serial thin section axial images are obtained from the cranial vertex through the skull base without the administration of intravenous contrast. Radiation dose reduction techniques were used for this study. Our CT scanners use one or all of the following: Automated exposure control, adjusted of the mA and/or kV according to patient size, iterative reconstruction COMPARISON: No prior. FINDINGS: There is no acute intracranial hemorrhage, mass, or mass effect. No abnormal extra-axial fluid collections identified. There is no hydrocephalus. The basilar cisterns are widely patent. The gray-white matter brain parenchymal interface is  well-maintained. No skull fracture or aggressive osseous lesion noted. The mastoid air cells and included paranasal sinuses are clear.     Normal head CT      CT CERVICAL SPINE WO CONTRAST    Result Date: 05/27/2022  EXAMINATION: CT CERVICAL SPINE WITHOUT DATE: 05/27/2022 6:30 PM INDICATION: Left arm intermittent numbness COMPARISON: None available at the time of this dictations. TECHNIQUE: Thin section axial noncontrast images were obtained through the cervical spine.  Sagittal and coronal reformatted images were created.  Images were reviewed in bone and soft tissue windows.  CT dose lowering techniques were  used, to include: automated exposure control, adjustment for patient size, and or use of iterative reconstruction. FINDINGS: Osseous: There is straightening of the normal cervical lordosis which may be positional or related to muscle strain/spasm. Vertebral body height and alignment is preserved. No acute fracture or subluxation. No prevertebral soft tissue swelling. The lateral masses of C1 align on C2. Degenerative Changes No significant degenerative changes. Soft Tissues of the Neck: No focal soft tissue abnormality within the visualized neck. Visualized lung  apices are clear. Visualized airways are patent.     No acute fracture or subluxation of the cervical spine.   Eliberto Ivory, M.D. 05/27/2022 7:27:00 PM    XR CHEST PORTABLE    Result Date: 05/27/2022  Portable chest x-ray Clinical indications: Numbness in the left arm FINDINGS: Single AP view the chest compared to a similar exam dated 12/29/2021 show the lungs to be expanded and clear. No pleural effusion or pneumothorax. The cardiac silhouette and mediastinum are unremarkable. The bones are unremarkable.     No acute cardiopulmonary abnormality.    CT CHEST PULMONARY EMBOLISM W CONTRAST    Result Date: 05/27/2022  EXAMINATION: CT ANGIOGRAM CHEST WITH IV CONTRAST   CT CHEST PULMONARY EMBOLISM W  CONTRAST DATE OF EXAMINATION:  05/27/2022 6:30 PM INDICATION:  dyspnea, recent travel and history of PE COMPARISON: Chest radiograph 05/27/2022 and 12/29/2021. CT soft tissue neck 05/18/2021. A report from a CT chest study dated 11/21/2020 is available but no images are currently available. PROCEDURE: Water-soluble iodine containing x-ray contrast was administered intravenously during arterial phase axial chest imaging, with 2-D sagittal and coronal and 3-D MIP reformations.  CT dose lowering techniques were used, to include: automated exposure control, adjustment for patient size, and or use of iterative reconstruction. FINDINGS:  Pulmonary Arteries: A pulmonary artery filling defect is not identified. In a pulmonary artery is at upper limits of normal in size. Cardiovascular:   Thoracic aorta normal.  No pericardial abnormality.  Mild four-chamber cardiomegaly. A thin catheter within the right brachiocephalic vein  is again seen, partially imaged on 06/17/2021. Mediastinum: Lymph nodes not enlarged. Extensive collaterals are also seen throughout the mediastinum, including several prominent collaterals along the upper esophagus, similar to 06/17/2021. Lungs:  Scattered areas of dependent subsegmental atelectasis are seen within the lungs. No focal consolidation. Central airways are patent. There is a 4 mm nodule in the left upper lobe (series 2 image 58).  Pleura:   No effusion or pneumothorax. Chest wall:  Prominent collaterals throughout the visualized chest wall are again seen, previously seen on 06/17/2021 study. No focal soft tissue abnormality. No enlarged axillary or supraclavicular lymph nodes. Upper abdomen: There is a curvilinear hyperdense/partially calcified structure in the left abdominal quadrant may represent an infarcted spleen. No acute abnormality in the visualized upper abdomen. Bones:  No acute osseous abnormality. Diffusely increased density of the visualized osseous structures     1.  No findings of pulmonary embolus.  2.  Scattered areas of subsegmental  atelectasis within lungs. No focal consolidation. 3.  Redemonstrated is extensive collateral vessels throughout the chest wall and  mediastinum, including several prominent vessels along the mid and upper esophageal wall that appears similar to 06/17/2021. 4.  There is a 4 mm nodule in the left upper lobe. Comparison with prior imaging  may provide information regarding stability. Otherwise and optional follow-up CT in 12 months is recommended to evaluate for stability per Fleischner Society 2017 guidelines for pulmonary nodules. 5.  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 Thank you for the referral of this patient. This exam was interpreted by an [employer] adviser [employer]iology certified radiologist with subspecialty fellowship training in body imaging.   If there are any questions regarding this exam please feel free to contact a radiologist directly at [phone].  Scherrie November 05/27/2022 7:22:00 PM        Signed:  Truitt Merle, DO    Part of this note  may have been written by using a voice dictation software.  The note has been proof read but may still contain some grammatical/other typographical errors.

## 2022-05-28 ENCOUNTER — Inpatient Hospital Stay: Payer: MEDICARE | Primary: Student in an Organized Health Care Education/Training Program

## 2022-05-28 ENCOUNTER — Inpatient Hospital Stay: Admit: 2022-05-28 | Payer: MEDICARE | Primary: Student in an Organized Health Care Education/Training Program

## 2022-05-28 LAB — CBC WITH AUTO DIFFERENTIAL
Absolute Immature Granulocyte: 0 10*3/uL (ref 0.0–0.5)
Basophils %: 0 % (ref 0.0–2.0)
Basophils Absolute: 0 10*3/uL (ref 0.0–0.2)
Eosinophils %: 1 % (ref 0.5–7.8)
Eosinophils Absolute: 0.1 10*3/uL (ref 0.0–0.8)
Hematocrit: 20.1 % — ABNORMAL LOW (ref 35.8–46.3)
Hemoglobin: 7 g/dL — ABNORMAL LOW (ref 11.7–15.4)
Immature Granulocytes: 0 % (ref 0.0–5.0)
Lymphocytes %: 35 % (ref 13–44)
Lymphocytes Absolute: 1.9 10*3/uL (ref 0.5–4.6)
MCH: 44 PG — ABNORMAL HIGH (ref 26.1–32.9)
MCHC: 34.8 g/dL (ref 31.4–35.0)
MCV: 126.4 FL — ABNORMAL HIGH (ref 82–102)
MPV: 9.6 FL (ref 9.4–12.3)
Monocytes %: 11 % (ref 4.0–12.0)
Monocytes Absolute: 0.6 10*3/uL (ref 0.1–1.3)
Neutrophils %: 53 % (ref 43–78)
Neutrophils Absolute: 2.9 10*3/uL (ref 1.7–8.2)
Platelets: 318 10*3/uL (ref 150–450)
RBC: 1.59 M/uL — ABNORMAL LOW (ref 4.05–5.2)
RDW: 17.9 % — ABNORMAL HIGH (ref 11.9–14.6)
WBC: 5.5 10*3/uL (ref 4.3–11.1)
nRBC: 0.93 10*3/uL — ABNORMAL HIGH (ref 0.0–0.2)

## 2022-05-28 LAB — RETICULOCYTES
Absolute Retic #: 0.0795 M/ul (ref 0.026–0.095)
Immature Retic Fraction: 22.6 % — ABNORMAL HIGH (ref 3.0–15.9)
Retic Hemoglobin conc.: 37 pg — ABNORMAL HIGH (ref 29–35)
Reticulocyte Count,Automated: 4.9 % — ABNORMAL HIGH (ref 0.3–2.0)

## 2022-05-28 LAB — COMPREHENSIVE METABOLIC PANEL W/ REFLEX TO MG FOR LOW K
ALT: 14 U/L (ref 12–65)
AST: 22 U/L (ref 15–37)
Albumin/Globulin Ratio: 0.9 (ref 0.4–1.6)
Albumin: 3.4 g/dL — ABNORMAL LOW (ref 3.5–5.0)
Alk Phosphatase: 83 U/L (ref 50–136)
Anion Gap: 4 mmol/L (ref 2–11)
BUN: 8 MG/DL (ref 6–23)
CO2: 23 mmol/L (ref 21–32)
Calcium: 8.8 MG/DL (ref 8.3–10.4)
Chloride: 109 mmol/L (ref 101–110)
Creatinine: 0.9 MG/DL (ref 0.6–1.0)
Est, Glom Filt Rate: >60 mL/min/{1.73_m2} (ref >60)
Globulin: 3.8 g/dL (ref 2.8–4.5)
Glucose: 102 mg/dL — ABNORMAL HIGH (ref 65–100)
Potassium: 4.1 mmol/L (ref 3.5–5.1)
Sodium: 136 mmol/L (ref 133–143)
Total Bilirubin: 1.5 MG/DL — ABNORMAL HIGH (ref 0.2–1.1)
Total Protein: 7.2 g/dL (ref 6.3–8.2)

## 2022-05-28 LAB — PROCALCITONIN: Procalcitonin: <0.05 ng/mL (ref 0.00–0.49)

## 2022-05-28 LAB — LACTIC ACID: Lactic Acid, Plasma: 1.1 MMOL/L (ref 0.4–2.0)

## 2022-05-28 MED ORDER — SODIUM CHLORIDE 0.9 % IV SOLN
0.9 % | INTRAVENOUS | Status: DC
Start: 2022-05-28 — End: 2022-05-28
  Administered 2022-05-28: 05:00:00 via INTRAVENOUS

## 2022-05-28 MED ORDER — HYDROMORPHONE HCL PF 1 MG/ML IJ SOLN
1 MG/ML | INTRAMUSCULAR | Status: AC | PRN
Start: 2022-05-28 — End: 2022-06-02
  Administered 2022-05-28 – 2022-06-01 (×17): 1 mg via INTRAVENOUS

## 2022-05-28 MED ORDER — DIPHENHYDRAMINE HCL 25 MG PO CAPS
25 MG | Freq: Four times a day (QID) | ORAL | Status: AC | PRN
Start: 2022-05-28 — End: 2022-05-29
  Administered 2022-05-28 (×3): 25 mg via ORAL

## 2022-05-28 MED ORDER — SODIUM CHLORIDE 0.45 % IV SOLN
0.45 % | INTRAVENOUS | Status: AC
Start: 2022-05-28 — End: 2022-06-02
  Administered 2022-05-28 – 2022-06-02 (×9): via INTRAVENOUS

## 2022-05-28 MED FILL — HYDROMORPHONE HCL 1 MG/ML IJ SOLN: 1 MG/ML | INTRAMUSCULAR | Qty: 1

## 2022-05-28 MED FILL — XARELTO 20 MG PO TABS: 20 MG | ORAL | Qty: 1

## 2022-05-28 MED FILL — ONDANSETRON HCL 4 MG/2ML IJ SOLN: 4 MG/2ML | INTRAMUSCULAR | Qty: 2

## 2022-05-28 MED FILL — LEVETIRACETAM 500 MG PO TABS: 500 MG | ORAL | Qty: 3

## 2022-05-28 MED FILL — HYDROCODONE-ACETAMINOPHEN 5-325 MG PO TABS: 5-325 MG | ORAL | Qty: 1

## 2022-05-28 MED FILL — DIPHENHYDRAMINE HCL 25 MG PO CAPS: 25 MG | ORAL | Qty: 1

## 2022-05-28 MED FILL — HYDROXYUREA 500 MG PO CAPS: 500 MG | ORAL | Qty: 2

## 2022-05-28 MED FILL — FLUOXETINE HCL 20 MG PO CAPS: 20 MG | ORAL | Qty: 2

## 2022-05-28 MED FILL — FLUTICASONE PROPIONATE 50 MCG/ACT NA SUSP: 50 MCG/ACT | NASAL | Qty: 16

## 2022-05-28 MED FILL — FOLIC ACID 1 MG PO TABS: 1 MG | ORAL | Qty: 1

## 2022-05-28 NOTE — H&P (Signed)
[facility] Hematology & Oncology        Inpatient Hematology / Oncology Consult History and Physical    Reason for Admission:  Sickle cell crisis [facility]) [D57.00]    History of Present Illness:  Ms. Gammon is a 46 y.o. female admitted on 05/27/2022. The primary encounter diagnosis was Sickle cell crisis ([place]). A diagnosis of History of DVT (deep vein thrombosis) was also pertinent to this visit..      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 She is on Hydrea and folic acid.  She is on Xarelto for hx of DVT.  Last hospitalization here in February 2023.  Last seen by Dr. [name] for [place] 07/2021.  She presented to the ER on day of admission with diffuse back pain, leg/hip/shoulder pain and intermittent numbness of LUE w/o weakness.  These symptoms have been typical of prior pain crisis.  She has not taken any pain meds since April when she took a trip to [place].  No other current symptoms.  CT cervical spine checked d/t LUE weakness and negative.  CT chest negative for PE but did show 4 mm LUL nodule, radiology recommended 12 month follow up for this.  Checking LUE doppler d/t LUE intermittent numbness and concern for possible SVC syndrome.  We were asked for our recommendations for our known patient.            Review of Systems:  Constitutional +fatigue.  Denies fever, chills, weight loss, appetite changes, night sweats.   HEENT Denies trauma, blurry vision, hearing loss, ear pain, nosebleeds, sore throat, neck pain and ear discharge.    Skin Denies lesions or rashes.   Lungs Denies dyspnea, cough, sputum production or hemoptysis.   Cardiovascular Denies chest pain, palpitations, or lower extremity edema.   Gastrointestinal Denies nausea, vomiting, changes in bowel habits, bloody or black stools, abdominal pain.   GU Denies dysuria, frequency or hesitancy of urination.   Neuro +LUE intermittent numbness.  Denies headaches, visual changes or  ataxia. Denies dizziness, tingling, tremors, speech change, focal weakness or headaches.     Hematology Denies easy bruising or bleeding, denies gingival bleeding or epistaxis.   Endo Denies heat/cold intolerance, denies diabetes or thyroid abnormalities.   MSK +diffuse pain r/t sickle cell crisis      Psychiatric/Behavioral Denies depression and substance abuse. The patient is not nervous/anxious.         Allergies   Allergen Reactions    Latex Rash    Ceftriaxone Other (See Comments)    Fentanyl Other (See Comments)     "my doctor said I had a stroke from it"    Influenza Vaccines Itching     unknown      Influenza Virus Vaccine Swelling    Meperidine Other (See Comments)    Morphine Itching    Oxycodone Other (See Comments)    Sulfa Antibiotics Rash     Past Medical History:   Diagnosis Date    Arthritis     Hx of blood clots     Migraines     Seizures (HCC)     Sickle cell disease ([place])     Stroke [facility])      Past Surgical History:   Procedure Laterality Date    CESAREAN SECTION      CHOLECYSTECTOMY      LAPAROTOMY      TUBAL LIGATION  VASCULAR SURGERY       Family History   Problem Relation Age of Onset    Diabetes Mother     Hypertension Mother     Diabetes Father     Hypertension Father      Social History     Socioeconomic History    Marital status: Single     Spouse name: Not on file    Number of children: Not on file    Years of education: Not on file    Highest education level: Not on file   Occupational History    Not on file   Tobacco Use    Smoking status: Never    Smokeless tobacco: Never   Substance and Sexual Activity    Alcohol use: Not Currently    Drug use: Never    Sexual activity: Not Currently   Other Topics Concern    Not on file   Social History Narrative    Not on file     Social Determinants of Health     Financial Resource Strain: Not on file   Food Insecurity: Not on file   Transportation Needs: Not on file   Physical Activity: Not on file   Stress: Not on file   Social Connections:  Not on file   Intimate Partner Violence: Not on file   Housing Stability: Not on file     Current Facility-Administered Medications   Medication Dose Route Frequency Provider Last Rate Last Admin    0.9 % sodium chloride infusion   IntraVENous Continuous Truitt Merle, DO 150 mL/hr at 05/28/22 0801 Rate Verify at 05/28/22 0801    diphenhydrAMINE (BENADRYL) capsule 25 mg  25 mg Oral Q6H PRN Earley Abide, MD   25 mg at 05/28/22 0053    HYDROmorphone HCl PF (DILAUDID) injection 1 mg  1 mg IntraVENous Q3H PRN Kerby Nora, APRN - NP   1 mg at 05/28/22 1201    HYDROcodone-acetaminophen (NORCO) 5-325 MG per tablet 1 tablet  1 tablet Oral Q6H PRN Truitt Merle, DO   1 tablet at 05/28/22 0756    sodium chloride flush 0.9 % injection 5-40 mL  5-40 mL IntraVENous 2 times per day Truitt Merle, DO   10 mL at 05/28/22 0757    sodium chloride flush 0.9 % injection 5-40 mL  5-40 mL IntraVENous PRN Truitt Merle, DO        0.9 % sodium chloride infusion   IntraVENous PRN Truitt Merle, DO        ondansetron (ZOFRAN-ODT) disintegrating tablet 4 mg  4 mg Oral Q8H PRN Truitt Merle, DO        Or    ondansetron [facility]) injection 4 mg  4 mg IntraVENous Q6H PRN Truitt Merle, DO   4 mg at 05/27/22 2009    polyethylene glycol (GLYCOLAX) packet 17 g  17 g Oral Daily PRN Truitt Merle, DO        acetaminophen (TYLENOL) tablet 650 mg  650 mg Oral Q6H PRN Truitt Merle, DO        Or    acetaminophen (TYLENOL) suppository 650 mg  650 mg Rectal Q6H PRN Truitt Merle, DO        cyclobenzaprine (FLEXERIL) tablet 10 mg  10 mg Oral BID PRN Truitt Merle, DO        FLUoxetine (PROZAC) capsule 40 mg  40 mg Oral Daily Truitt Merle, DO  40 mg at 05/28/22 0756    fluticasone (FLONASE) 50 MCG/ACT nasal spray 2 spray  2 spray Each Nostril Daily Truitt Merle, DO   2 spray at 98/11/91 4782    folic acid (FOLVITE) tablet 1 mg  1 mg Oral Daily Truitt Merle, DO   1 mg at 05/28/22 0756    hydroxyurea  (HYDREA) chemo capsule 1,000 mg  1,000 mg Oral Daily Truitt Merle, DO   1,000 mg at 05/28/22 9562    levETIRAcetam (KEPPRA) tablet 1,500 mg  1,500 mg Oral BID Truitt Merle, DO   1,500 mg at 05/28/22 1308    rivaroxaban (XARELTO) tablet 20 mg  20 mg Oral Daily with breakfast Truitt Merle, DO   20 mg at 05/28/22 0756       OBJECTIVE:  Patient Vitals for the past 8 hrs:   BP Temp Temp src Pulse Resp SpO2   05/28/22 1203 (!) 91/59 97.7 F (36.5 C) -- 81 18 99 %   05/28/22 0756 93/62 97.9 F (36.6 C) Oral 74 18 96 %     Temp (24hrs), Avg:97.9 F (36.6 C), Min:97.7 F (36.5 C), Max:98 F (36.7 C)    06/25 0701 - 06/25 1900  In: 240 [P.O.:240]  Out: 300 [Urine:300]    Physical Exam:  Constitutional: Well developed, well nourished female in no acute distress, sitting comfortably on the hospital bed.   HEENT: Normocephalic and atraumatic. Neck supple.      Skin Warm and dry.  No bruising and no rash noted.  No erythema.  No pallor.    Neuro Grossly nonfocal with no obvious sensory or motor deficits.   MSK Normal range of motion in general.  No edema and no tenderness.   Psych Appropriate mood and affect.    Full physical exam per attending MD.      Labs:    Recent Results (from the past 24 hour(s))   CBC with Auto Differential    Collection Time: 05/27/22 12:59 PM   Result Value Ref Range    WBC 4.1 (L) 4.3 - 11.1 K/uL    RBC 2.00 (L) 4.05 - 5.2 M/uL    Hemoglobin 8.8 (L) 11.7 - 15.4 g/dL    Hematocrit 24.4 (L) 35.8 - 46.3 %    MCV 122.0 (H) 82 - 102 FL    MCH 44.0 (H) 26.1 - 32.9 PG    MCHC 36.1 (H) 31.4 - 35.0 g/dL    RDW 17.2 (H) 11.9 - 14.6 %    Platelets 406 150 - 450 K/uL    MPV 9.3 (L) 9.4 - 12.3 FL    nRBC 1.03 (H) 0.0 - 0.2 K/uL    Differential Type AUTOMATED      Neutrophils % 65 43 - 78 %    Lymphocytes % 23 13 - 44 %    Monocytes % 10 4.0 - 12.0 %    Eosinophils % 1 0.5 - 7.8 %    Basophils % 1 0.0 - 2.0 %    Immature Granulocytes 1 0.0 - 5.0 %    Neutrophils Absolute 2.6 1.7 - 8.2 K/UL     Lymphocytes Absolute 0.9 0.5 - 4.6 K/UL    Monocytes Absolute 0.4 0.1 - 1.3 K/UL    Eosinophils Absolute 0.0 0.0 - 0.8 K/UL    Basophils Absolute 0.0 0.0 - 0.2 K/UL    Absolute Immature Granulocyte 0.0 0.0 - 0.5 K/UL   Comprehensive Metabolic Panel  Collection Time: 05/27/22 12:59 PM   Result Value Ref Range    Sodium 138 133 - 143 mmol/L    Potassium 4.1 3.5 - 5.1 mmol/L    Chloride 109 101 - 110 mmol/L    CO2 24 21 - 32 mmol/L    Anion Gap 5 2 - 11 mmol/L    Glucose 102 (H) 65 - 100 mg/dL    BUN 8 6 - 23 MG/DL    Creatinine 1.00 0.6 - 1.0 MG/DL    Est, Glom Filt Rate >60 >60 ml/min/1.74m    Calcium 9.4 8.3 - 10.4 MG/DL    Total Bilirubin 1.2 (H) 0.2 - 1.1 MG/DL    ALT 17 12 - 65 U/L    AST 22 15 - 37 U/L    Alk Phosphatase 104 50 - 136 U/L    Total Protein 8.7 (H) 6.3 - 8.2 g/dL    Albumin 4.1 3.5 - 5.0 g/dL    Globulin 4.6 (H) 2.8 - 4.5 g/dL    Albumin/Globulin Ratio 0.9 0.4 - 1.6     Reticulocytes    Collection Time: 05/27/22 12:59 PM   Result Value Ref Range    Reticulocyte Count,Automated 4.7 (H) 0.3 - 2.0 %    Absolute Retic # 0.0940 0.026 - 0.095 M/ul    Immature Retic Fraction 22.4 (H) 3.0 - 15.9 %    Retic Hemoglobin conc. 38 (H) 29 - 35 pg   Lactate Dehydrogenase    Collection Time: 05/27/22 12:59 PM   Result Value Ref Range    LD 275 (H) 100 - 190 U/L   Magnesium    Collection Time: 05/27/22 12:59 PM   Result Value Ref Range    Magnesium 2.2 1.8 - 2.4 mg/dL   Comprehensive Metabolic Panel w/ Reflex to MG    Collection Time: 05/28/22  6:03 AM   Result Value Ref Range    Sodium 136 133 - 143 mmol/L    Potassium 4.1 3.5 - 5.1 mmol/L    Chloride 109 101 - 110 mmol/L    CO2 23 21 - 32 mmol/L    Anion Gap 4 2 - 11 mmol/L    Glucose 102 (H) 65 - 100 mg/dL    BUN 8 6 - 23 MG/DL    Creatinine 0.90 0.6 - 1.0 MG/DL    Est, Glom Filt Rate >60 >60 ml/min/1.765m   Calcium 8.8 8.3 - 10.4 MG/DL    Total Bilirubin 1.5 (H) 0.2 - 1.1 MG/DL    ALT 14 12 - 65 U/L    AST 22 15 - 37 U/L    Alk Phosphatase 83 50 - 136 U/L     Total Protein 7.2 6.3 - 8.2 g/dL    Albumin 3.4 (L) 3.5 - 5.0 g/dL    Globulin 3.8 2.8 - 4.5 g/dL    Albumin/Globulin Ratio 0.9 0.4 - 1.6     CBC with Auto Differential    Collection Time: 05/28/22  6:03 AM   Result Value Ref Range    WBC 5.5 4.3 - 11.1 K/uL    RBC 1.59 (L) 4.05 - 5.2 M/uL    Hemoglobin 7.0 (L) 11.7 - 15.4 g/dL    Hematocrit 20.1 (L) 35.8 - 46.3 %    MCV 126.4 (H) 82 - 102 FL    MCH 44.0 (H) 26.1 - 32.9 PG    MCHC 34.8 31.4 - 35.0 g/dL    RDW 17.9 (H) 11.9 - 14.6 %  Platelets 318 150 - 450 K/uL    MPV 9.6 9.4 - 12.3 FL    nRBC 0.93 (H) 0.0 - 0.2 K/uL    Differential Type AUTOMATED      Neutrophils % 53 43 - 78 %    Lymphocytes % 35 13 - 44 %    Monocytes % 11 4.0 - 12.0 %    Eosinophils % 1 0.5 - 7.8 %    Basophils % 0 0.0 - 2.0 %    Immature Granulocytes 0 0.0 - 5.0 %    Neutrophils Absolute 2.9 1.7 - 8.2 K/UL    Lymphocytes Absolute 1.9 0.5 - 4.6 K/UL    Monocytes Absolute 0.6 0.1 - 1.3 K/UL    Eosinophils Absolute 0.1 0.0 - 0.8 K/UL    Basophils Absolute 0.0 0.0 - 0.2 K/UL    Absolute Immature Granulocyte 0.0 0.0 - 0.5 K/UL   Reticulocytes    Collection Time: 05/28/22  6:03 AM   Result Value Ref Range    Reticulocyte Count,Automated 4.9 (H) 0.3 - 2.0 %    Absolute Retic # 0.0795 0.026 - 0.095 M/ul    Immature Retic Fraction 22.6 (H) 3.0 - 15.9 %    Retic Hemoglobin conc. 37 (H) 29 - 35 pg       Imaging:  CT Result (most recent):  CT CERVICAL SPINE WO CONTRAST 05/27/2022    Narrative  EXAMINATION: CT CERVICAL SPINE WITHOUT    DATE: 05/27/2022 6:30 PM    INDICATION: Left arm intermittent numbness    COMPARISON: None available at the time of this dictations.    TECHNIQUE: Thin section axial noncontrast images were obtained through the  cervical spine.  Sagittal and coronal reformatted images were created.  Images  were reviewed in bone and soft tissue windows.  CT dose lowering techniques were  used, to include: automated exposure control, adjustment for patient size, and  or use of iterative  reconstruction.    FINDINGS:    Osseous:    There is straightening of the normal cervical lordosis which may be positional  or related to muscle strain/spasm.    Vertebral body height and alignment is preserved.    No acute fracture or subluxation.    No prevertebral soft tissue swelling. The lateral masses of C1 align on C2.      Degenerative Changes    No significant degenerative changes.    Soft Tissues of the Neck:      No focal soft tissue abnormality within the visualized neck.    Visualized lung apices are clear. Visualized airways are patent.    Impression  No acute fracture or subluxation of the cervical spine.              Eliberto Ivory, M.D.  05/27/2022 7:27:00 PM        ASSESSMENT:  Patient Active Problem List   Diagnosis    Hypercoagulable state ([place])    Seizure disorder ([place])    Sickle cell anemia with crisis ([place])    Sickle cell disease ([place])    CAP (community acquired pneumonia)    Acute chest wall pain    Atopic rhinitis    Gastroesophageal reflux disease    Depression    Superior vena cava occlusion (HCC)    History of DVT (deep vein thrombosis)    Menorrhagia    Abnormal uterine bleeding    Symptomatic anemia    Sickle cell pain crisis (HCC)    Acute sinusitis  Sickle cell crisis (HCC)    Migraines     PLAN:  Sickle cell pain crisis / HbSS  - Continue Hydrea, folic acid  - Continue aggressive IVF  - Continue pain management - IV dilaudid 3m every 3 hrs and norco 521mevery 6 hrs PRN. Benadryl PO only  - Daily retic  - On RA.      Hx of DVT  - Continue Xarelto 20 mg daily      Hx of seizures  - Continue Keppra     Concern for ? SVC Syndrome  - LUE intermittent numbness  - CT cervical spine WNL  - checking LUE doppler  - ? Consult vascular    LUL Pulmonary Nodule  - radiology recommends 12 month follow up.       [place] home meds  [place] SOPs  VTE prophylaxis - on Xarelto     Dispo - Anticipate DC home when pain controlled. FU with Dr KhHumphrey Rollsn office within 1 week of DC.    Goals and plan of care  reviewed with the patient.  All questions answered to the best of our ability.  We will gladly take over care of our patient.                SaKerby NoraAPRN - NP   [facility]  [address] [id number] : [phone] : [phone]      Attending Addendum:  I have personally performed a face to face diagnostic evaluation on this patient. I have reviewed and agree with the care plan as documented by SaKerby NoraN.P. 61 minutes were spent on patient care, including but not limited to, reviewing the chart and time with the patient and family, more than 50% of the time documented was spent in face-to-face contact with the patient and in the care of the patient on the floor/unit where the patient is located. My findings are as follows: She has Sickle Cell pain crisis, appears weak, heart rate regular without murmurs, abdomen is non-tender, bowel sounds are positive, we will transfer her to our service and continue supplemental Oxygen, IV fluids, Hydrea, Folic acid and adjust her pain meds.              ShFloyde ParkinsMD    [facility]  [address] [id number] : [phone] : [phone]

## 2022-05-28 NOTE — Progress Notes (Signed)
Hospitalist Progress Note   Admit Date:  05/27/2022 12:37 PM   Name:  [name] Bright   Age:  46 y.o.  Sex:  female  DOB:  September 07, 1976   MRN:  [id number]   Room:  504/01    Presenting/Chief Complaint: Arm Pain     Reason(s) for Admission: Sickle cell crisis [facility]) [D57.00]     Hospital Course:   [name] Bright is a 46 y.o. female with medical history of seizures, DVT, PE, splenic infarct, SVC thrombosis. who presented with report of diffuse back pain, pain in top of leg, bilateral hips and bilateral shoulders and intermittent numbness of LUE w/o weakness.  Admitted for sickle cell pain crisis.  Started on IV fluid, Hydrea and folic acid and pain regimen.  Oncology consulted.    Subjective & 24hr Events:   Patient is seen at the bedside.  Reports feeling little better but still with significant back, ribs and extremities pain.  Complaining of intermittent numbness Left upper extremity.  Denies nausea, vomiting or abdominal pain.  Denies fever or chills.  Hemoglobin 7, reticulocyte count 4.9.    Assessment & Plan:     # Sickle cell crisis  - cont hydrea, folic acid  - Aggressive IVF  - prn norco 5 for moderate, prn dilaudid IV for severe pain  - daily retic counts, 4.9 today  - Hemoglobin 7, likely some component of dilutional in the setting of IV fluids.  I will defer blood transfusion to oncology  - consult to oncology, pt known to them    # ?SVC syndrome   - LUE intermittent numbness.  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      # Hx of DVT  - continue xarelto 254mdaily     # hx of seizures  - continue Keppra     # Depression  - cont prozac     PT/OT evals and PPD needed/ordered?  No  Diet:  ADULT DIET; Regular  VTE prophylaxis: Already on  anticoagulation  Code status: Full Code    Hospital Problems:  Principal Problem:    Sickle cell crisis ([facility] Active Problems:    Migraines  Resolved Problems:    * No resolved hospital problems. *      Objective:   Patient Vitals for the past 24 hrs:   Temp Pulse Resp BP SpO2   05/28/22 0756 97.9 F (36.6 C) 74 18 93/62 96 %   05/28/22 0339 97.9 F (36.6 C) 72 16 (!) 109/50 92 %   05/27/22 2327 98 F (36.7 C) 69 16 121/74 97 %   05/27/22 2158 -- -- 18 -- --   05/27/22 1959 97.9 F (36.6 C) 74 24 122/79 98 %   05/27/22 1718 -- -- -- 104/64 97 %   05/27/22 1433 -- -- -- 106/65 98 %   05/27/22 1418 -- -- -- 114/76 98 %   05/27/22 1403 -- -- -- 107/71 97 %   05/27/22 1348 -- -- -- 107/73 99 %   05/27/22 1226 98.2 F (36.8 C) 79 17 124/83 100 %       Oxygen Therapy  SpO2: 96 %  Pulse via Oximetry:  90 beats per minute  O2 Device: None (Room air)    Estimated body mass index is 25.54 kg/m as calculated from the following:    Height as of this encounter: '5\' 8"'  (1.727 m).    Weight as of this encounter: 168 lb (76.2 kg).    Intake/Output Summary (Last 24 hours) at 05/28/2022 1014  Last data filed at 05/28/2022 0836  Gross per 24 hour   Intake 240 ml   Output 600 ml   Net -360 ml         Physical Exam:     General:    Well nourished.    Head:  Normocephalic, atraumatic  Eyes:  Sclerae appear normal.  Pupils equally round.  ENT:  Nares appear normal.  Moist oral mucosa  Neck:  No restricted ROM.  Trachea midline   CV:   RRR.  No m/r/g.  No jugular venous distension.  Lungs:   CTAB.  No wheezing, rhonchi, or rales.  Symmetric expansion.  Abdomen:   Soft, nontender, nondistended.  Extremities: Left upper extremity edema  Skin:     No rashes and normal coloration.   Warm and dry.    Neuro:  CN II-XII grossly intact.    Psych:  Normal mood and affect.      I have personally reviewed labs and tests:  Recent Labs:  Recent Results (from the past 48 hour(s))   CBC with Auto Differential    Collection Time: 05/27/22 12:59 PM    Result Value Ref Range    WBC 4.1 (L) 4.3 - 11.1 K/uL    RBC 2.00 (L) 4.05 - 5.2 M/uL    Hemoglobin 8.8 (L) 11.7 - 15.4 g/dL    Hematocrit 24.4 (L) 35.8 - 46.3 %    MCV 122.0 (H) 82 - 102 FL    MCH 44.0 (H) 26.1 - 32.9 PG    MCHC 36.1 (H) 31.4 - 35.0 g/dL    RDW 17.2 (H) 11.9 - 14.6 %    Platelets 406 150 - 450 K/uL    MPV 9.3 (L) 9.4 - 12.3 FL    nRBC 1.03 (H) 0.0 - 0.2 K/uL    Differential Type AUTOMATED      Neutrophils % 65 43 - 78 %    Lymphocytes % 23 13 - 44 %    Monocytes % 10 4.0 - 12.0 %    Eosinophils % 1 0.5 - 7.8 %    Basophils % 1 0.0 - 2.0 %    Immature Granulocytes 1 0.0 - 5.0 %    Neutrophils Absolute 2.6 1.7 - 8.2 K/UL    Lymphocytes Absolute 0.9 0.5 - 4.6 K/UL    Monocytes Absolute 0.4 0.1 - 1.3 K/UL    Eosinophils Absolute 0.0 0.0 - 0.8 K/UL    Basophils Absolute 0.0 0.0 - 0.2 K/UL    Absolute Immature Granulocyte 0.0 0.0 - 0.5 K/UL   Comprehensive Metabolic Panel    Collection Time: 05/27/22 12:59 PM   Result Value Ref Range    Sodium 138 133 - 143 mmol/L    Potassium 4.1 3.5 - 5.1 mmol/L    Chloride 109 101 - 110 mmol/L    CO2 24 21 - 32 mmol/L    Anion Gap 5 2 - 11 mmol/L    Glucose 102 (H) 65 - 100 mg/dL    BUN 8 6 - 23 MG/DL    Creatinine 1.00 0.6 - 1.0 MG/DL    Est, Glom Filt Rate >  60 >60 ml/min/1.73m    Calcium 9.4 8.3 - 10.4 MG/DL    Total Bilirubin 1.2 (H) 0.2 - 1.1 MG/DL    ALT 17 12 - 65 U/L    AST 22 15 - 37 U/L    Alk Phosphatase 104 50 - 136 U/L    Total Protein 8.7 (H) 6.3 - 8.2 g/dL    Albumin 4.1 3.5 - 5.0 g/dL    Globulin 4.6 (H) 2.8 - 4.5 g/dL    Albumin/Globulin Ratio 0.9 0.4 - 1.6     Reticulocytes    Collection Time: 05/27/22 12:59 PM   Result Value Ref Range    Reticulocyte Count,Automated 4.7 (H) 0.3 - 2.0 %    Absolute Retic # 0.0940 0.026 - 0.095 M/ul    Immature Retic Fraction 22.4 (H) 3.0 - 15.9 %    Retic Hemoglobin conc. 38 (H) 29 - 35 pg   Lactate Dehydrogenase    Collection Time: 05/27/22 12:59 PM   Result Value Ref Range    LD 275 (H) 100 - 190 U/L   Magnesium     Collection Time: 05/27/22 12:59 PM   Result Value Ref Range    Magnesium 2.2 1.8 - 2.4 mg/dL   Comprehensive Metabolic Panel w/ Reflex to MG    Collection Time: 05/28/22  6:03 AM   Result Value Ref Range    Sodium 136 133 - 143 mmol/L    Potassium 4.1 3.5 - 5.1 mmol/L    Chloride 109 101 - 110 mmol/L    CO2 23 21 - 32 mmol/L    Anion Gap 4 2 - 11 mmol/L    Glucose 102 (H) 65 - 100 mg/dL    BUN 8 6 - 23 MG/DL    Creatinine 0.90 0.6 - 1.0 MG/DL    Est, Glom Filt Rate >60 >60 ml/min/1.75m   Calcium 8.8 8.3 - 10.4 MG/DL    Total Bilirubin 1.5 (H) 0.2 - 1.1 MG/DL    ALT 14 12 - 65 U/L    AST 22 15 - 37 U/L    Alk Phosphatase 83 50 - 136 U/L    Total Protein 7.2 6.3 - 8.2 g/dL    Albumin 3.4 (L) 3.5 - 5.0 g/dL    Globulin 3.8 2.8 - 4.5 g/dL    Albumin/Globulin Ratio 0.9 0.4 - 1.6     CBC with Auto Differential    Collection Time: 05/28/22  6:03 AM   Result Value Ref Range    WBC 5.5 4.3 - 11.1 K/uL    RBC 1.59 (L) 4.05 - 5.2 M/uL    Hemoglobin 7.0 (L) 11.7 - 15.4 g/dL    Hematocrit 20.1 (L) 35.8 - 46.3 %    MCV 126.4 (H) 82 - 102 FL    MCH 44.0 (H) 26.1 - 32.9 PG    MCHC 34.8 31.4 - 35.0 g/dL    RDW 17.9 (H) 11.9 - 14.6 %    Platelets 318 150 - 450 K/uL    MPV 9.6 9.4 - 12.3 FL    nRBC 0.93 (H) 0.0 - 0.2 K/uL    Differential Type AUTOMATED      Neutrophils % 53 43 - 78 %    Lymphocytes % 35 13 - 44 %    Monocytes % 11 4.0 - 12.0 %    Eosinophils % 1 0.5 - 7.8 %    Basophils % 0 0.0 - 2.0 %    Immature Granulocytes 0 0.0 - 5.0 %  Neutrophils Absolute 2.9 1.7 - 8.2 K/UL    Lymphocytes Absolute 1.9 0.5 - 4.6 K/UL    Monocytes Absolute 0.6 0.1 - 1.3 K/UL    Eosinophils Absolute 0.1 0.0 - 0.8 K/UL    Basophils Absolute 0.0 0.0 - 0.2 K/UL    Absolute Immature Granulocyte 0.0 0.0 - 0.5 K/UL   Reticulocytes    Collection Time: 05/28/22  6:03 AM   Result Value Ref Range    Reticulocyte Count,Automated 4.9 (H) 0.3 - 2.0 %    Absolute Retic # 0.0795 0.026 - 0.095 M/ul    Immature Retic Fraction 22.6 (H) 3.0 - 15.9 %    Retic  Hemoglobin conc. 37 (H) 29 - 35 pg       Current Meds:  Current Facility-Administered Medications   Medication Dose Route Frequency    0.9 % sodium chloride infusion   IntraVENous Continuous    diphenhydrAMINE (BENADRYL) capsule 25 mg  25 mg Oral Q6H PRN    HYDROmorphone HCl PF (DILAUDID) injection 1 mg  1 mg IntraVENous Q3H PRN    HYDROcodone-acetaminophen (NORCO) 5-325 MG per tablet 1 tablet  1 tablet Oral Q6H PRN    sodium chloride flush 0.9 % injection 5-40 mL  5-40 mL IntraVENous 2 times per day    sodium chloride flush 0.9 % injection 5-40 mL  5-40 mL IntraVENous PRN    0.9 % sodium chloride infusion   IntraVENous PRN    ondansetron (ZOFRAN-ODT) disintegrating tablet 4 mg  4 mg Oral Q8H PRN    Or    ondansetron (ZOFRAN) injection 4 mg  4 mg IntraVENous Q6H PRN    polyethylene glycol (GLYCOLAX) packet 17 g  17 g Oral Daily PRN    acetaminophen (TYLENOL) tablet 650 mg  650 mg Oral Q6H PRN    Or    acetaminophen (TYLENOL) suppository 650 mg  650 mg Rectal Q6H PRN    cyclobenzaprine (FLEXERIL) tablet 10 mg  10 mg Oral BID PRN    FLUoxetine (PROZAC) capsule 40 mg  40 mg Oral Daily    fluticasone (FLONASE) 50 MCG/ACT nasal spray 2 spray  2 spray Each Nostril Daily    folic acid (FOLVITE) tablet 1 mg  1 mg Oral Daily    hydroxyurea (HYDREA) chemo capsule 1,000 mg  1,000 mg Oral Daily    levETIRAcetam (KEPPRA) tablet 1,500 mg  1,500 mg Oral BID    rivaroxaban (XARELTO) tablet 20 mg  20 mg Oral Daily with breakfast       Signed:  Malachi Pro, MD    Part of this note may have been written by using a voice dictation software.  The note has been proof read but may still contain some grammatical/other typographical errors.

## 2022-05-28 NOTE — Consults (Signed)
Please see H&P dated 05/28/2022.            Renne Platts, FNP-C  [facility]  [address] [place] [address]  Office : [phone]  Fax : [phone]

## 2022-05-29 LAB — CBC WITH AUTO DIFFERENTIAL
Absolute Immature Granulocyte: 0 10*3/uL (ref 0.0–0.5)
Basophils %: 0 % (ref 0.0–2.0)
Basophils Absolute: 0 10*3/uL (ref 0.0–0.2)
Eosinophils %: 3 % (ref 0.5–7.8)
Eosinophils Absolute: 0.1 10*3/uL (ref 0.0–0.8)
Hematocrit: 18.5 % — ABNORMAL LOW (ref 35.8–46.3)
Hemoglobin: 6.8 g/dL — CL (ref 11.7–15.4)
Immature Granulocytes: 0 % (ref 0.0–5.0)
Lymphocytes %: 35 % (ref 13–44)
Lymphocytes Absolute: 1.7 10*3/uL (ref 0.5–4.6)
MCH: 44.4 PG — ABNORMAL HIGH (ref 26.1–32.9)
MCHC: 36.8 g/dL — ABNORMAL HIGH (ref 31.4–35.0)
MCV: 120.9 FL — ABNORMAL HIGH (ref 82–102)
MPV: 9.6 FL (ref 9.4–12.3)
Monocytes %: 9 % (ref 4.0–12.0)
Monocytes Absolute: 0.4 10*3/uL (ref 0.1–1.3)
Neutrophils %: 53 % (ref 43–78)
Neutrophils Absolute: 2.7 10*3/uL (ref 1.7–8.2)
Platelets: 358 10*3/uL (ref 150–450)
RBC: 1.53 M/uL — ABNORMAL LOW (ref 4.05–5.2)
RDW: 17.8 % — ABNORMAL HIGH (ref 11.9–14.6)
WBC: 4.9 10*3/uL (ref 4.3–11.1)
nRBC: 1.03 10*3/uL — ABNORMAL HIGH (ref 0.0–0.2)

## 2022-05-29 LAB — MAGNESIUM: Magnesium: 1.9 mg/dL (ref 1.8–2.4)

## 2022-05-29 LAB — RETICULOCYTES
Absolute Retic #: 0.0812 M/ul (ref 0.026–0.095)
Immature Retic Fraction: 26.9 % — ABNORMAL HIGH (ref 3.0–15.9)
Retic Hemoglobin conc.: 39 pg — ABNORMAL HIGH (ref 29–35)
Reticulocyte Count,Automated: 5.3 % — ABNORMAL HIGH (ref 0.3–2.0)

## 2022-05-29 LAB — COMPREHENSIVE METABOLIC PANEL
ALT: 17 U/L (ref 12–65)
AST: 22 U/L (ref 15–37)
Albumin/Globulin Ratio: 0.9 (ref 0.4–1.6)
Albumin: 3.3 g/dL — ABNORMAL LOW (ref 3.5–5.0)
Alk Phosphatase: 91 U/L (ref 50–136)
Anion Gap: 3 mmol/L (ref 2–11)
BUN: 12 MG/DL (ref 6–23)
CO2: 25 mmol/L (ref 21–32)
Calcium: 8.7 MG/DL (ref 8.3–10.4)
Chloride: 109 mmol/L (ref 101–110)
Creatinine: 1 MG/DL (ref 0.6–1.0)
Est, Glom Filt Rate: >60 mL/min/{1.73_m2} (ref >60)
Globulin: 3.8 g/dL (ref 2.8–4.5)
Glucose: 102 mg/dL — ABNORMAL HIGH (ref 65–100)
Potassium: 3.9 mmol/L (ref 3.5–5.1)
Sodium: 137 mmol/L (ref 133–143)
Total Bilirubin: 1.2 MG/DL — ABNORMAL HIGH (ref 0.2–1.1)
Total Protein: 7.1 g/dL (ref 6.3–8.2)

## 2022-05-29 LAB — POCT URINALYSIS DIPSTICK
Bilirubin, Urine, POC: NEGATIVE
Blood, UA POC: NEGATIVE
Glucose, UA POC: NEGATIVE mg/dL
Ketones, Urine, POC: NEGATIVE mg/dL
Leukocyte Est, UA POC: NEGATIVE
Nitrite, Urine, POC: NEGATIVE
Protein, Urine, POC: NEGATIVE mg/dL
Specific Gravity, Urine, POC: 1.01 (ref 1.001–1.023)
URINE UROBILINOGEN POC: 0.2 EU/dL (ref 0.2–1.0)
pH, Urine, POC: 7 (ref 5.0–9.0)

## 2022-05-29 MED ORDER — DIPHENHYDRAMINE HCL 25 MG PO CAPS
25 MG | Freq: Four times a day (QID) | ORAL | Status: AC | PRN
Start: 2022-05-29 — End: 2022-06-02
  Administered 2022-05-30 (×2): 50 mg via ORAL

## 2022-05-29 MED ORDER — HYDROCORTISONE 1 % EX CREA
1 % | Freq: Two times a day (BID) | CUTANEOUS | Status: AC
Start: 2022-05-29 — End: 2022-06-02
  Administered 2022-05-29 – 2022-06-02 (×9): via TOPICAL

## 2022-05-29 MED ORDER — AMOXICILLIN-POT CLAVULANATE 875-125 MG PO TABS
875-125 MG | Freq: Two times a day (BID) | ORAL | Status: AC
Start: 2022-05-29 — End: 2022-06-05
  Administered 2022-05-29 – 2022-06-02 (×9): 1 via ORAL

## 2022-05-29 MED FILL — AMOXICILLIN-POT CLAVULANATE 875-125 MG PO TABS: 875-125 MG | ORAL | Qty: 1

## 2022-05-29 MED FILL — XARELTO 20 MG PO TABS: 20 MG | ORAL | Qty: 1

## 2022-05-29 MED FILL — HYDROCORTISONE MAX ST/12 MOIST 1 % EX CREA: 1 % | CUTANEOUS | Qty: 28.4

## 2022-05-29 MED FILL — HYDROCODONE-ACETAMINOPHEN 5-325 MG PO TABS: 5-325 MG | ORAL | Qty: 1

## 2022-05-29 MED FILL — HYDROMORPHONE HCL 1 MG/ML IJ SOLN: 1 MG/ML | INTRAMUSCULAR | Qty: 1

## 2022-05-29 MED FILL — FLUOXETINE HCL 20 MG PO CAPS: 20 MG | ORAL | Qty: 2

## 2022-05-29 MED FILL — LEVETIRACETAM 500 MG PO TABS: 500 MG | ORAL | Qty: 3

## 2022-05-29 MED FILL — HYDROXYUREA 500 MG PO CAPS: 500 MG | ORAL | Qty: 2

## 2022-05-29 MED FILL — FOLIC ACID 1 MG PO TABS: 1 MG | ORAL | Qty: 1

## 2022-05-29 NOTE — Progress Notes (Signed)
EOS 7P-7A:    -IV on the left arm taken out due to DVT  -unable to get IV in R arm due to multiple tries and the pt is a hard stick  -Dr. Dairl Ponder notified  -VAT team consulted for line placement  -norco given 2x for pain  -hgb 6.8 this morning  -primary hospitalist Dr- Dr. Dairl Ponder notified  -no new orders  -pt resting in bed  -no needs at this time  -vss

## 2022-05-29 NOTE — Progress Notes (Signed)
Oncology has assumed care on their patient as primary.  Hospitalist will sign off at this time.  Please call us if have any questions.

## 2022-05-29 NOTE — Care Coordination-Inpatient (Signed)
Chart screened by case manager for discharge planning.  No needs identified at this time.  Please consult case manager if any new issues arise.       05/27/22 2212   Service Assessment   Patient Orientation Alert and Oriented   Cognition Alert   History Provided By Medical Record   Primary Caregiver Self   Patient's Healthcare Decision Maker is: Named in Scanned ACP Document   PCP Verified by CM Yes   Last Visit to PCP Within last 6 months   Prior Functional Level Independent in ADLs/IADLs   Current Functional Level Other (see comment)  (TBD by clinical team)   Can patient return to prior living arrangement Yes   Ability to make needs known: Good   Family able to assist with home care needs: Yes   Would you like for me to discuss the discharge plan with any other family members/significant others, and if so, who? No   Condition of Participation: Discharge Planning   The Plan for Transition of Care is related to the following treatment goals: Anticipate return home

## 2022-05-29 NOTE — Plan of Care (Signed)
Problem: Pain  Goal: Verbalizes/displays adequate comfort level or baseline comfort level  Outcome: Progressing     Problem: Safety - Adult  Goal: Free from fall injury  Outcome: Progressing     Problem: ABCDS Injury Assessment  Goal: Absence of physical injury  Outcome: Progressing     Problem: Skin/Tissue Integrity  Goal: Absence of new skin breakdown  Description: 1.  Monitor for areas of redness and/or skin breakdown  2.  Assess vascular access sites hourly  3.  Every 4-6 hours minimum:  Change oxygen saturation probe site  4.  Every 4-6 hours:  If on nasal continuous positive airway pressure, respiratory therapy assess nares and determine need for appliance change or resting period.  Outcome: Progressing     Problem: Chronic Conditions and Co-morbidities  Goal: Patient's chronic conditions and co-morbidity symptoms are monitored and maintained or improved  Outcome: Progressing

## 2022-05-29 NOTE — Progress Notes (Signed)
[facility]        Inpatient Hematology / Oncology Progress Note    Reason for Admission:  Sickle cell crisis ([place]) [D57.00]    24 Hour Events:  Afeb, VSS  Hgb 6.8, retic 5.3  Pain controlled  Left breast edema/tenderness  Pruritis-bug bites         ROS:  Constitutional: Positive for fatigue; negative for fever, chills, weakness, malaise.  CV: Negative for chest pain, palpitations, edema.  Respiratory: Negative for dyspnea, cough, wheezing.  GI: Negative for nausea, abdominal pain, diarrhea.    10 point review of systems is otherwise negative with the exception of the elements mentioned above in the HPI.       Allergies   Allergen Reactions    Latex Rash    Ceftriaxone Other (See Comments)    Fentanyl Other (See Comments)     "my doctor said I had a stroke from it"    Influenza Vaccines Itching     unknown      Influenza Virus Vaccine Swelling    Meperidine Other (See Comments)    Morphine Itching    Oxycodone Other (See Comments)    Sulfa Antibiotics Rash     Past Medical History:   Diagnosis Date    Arthritis     Hx of blood clots     Migraines     Seizures (HCC)     Sickle cell disease ([place])     Stroke ([place])      Past Surgical History:   Procedure Laterality Date    CESAREAN SECTION      CHOLECYSTECTOMY      LAPAROTOMY      TUBAL LIGATION      VASCULAR SURGERY       Family History   Problem Relation Age of Onset    Diabetes Mother     Hypertension Mother     Diabetes Father     Hypertension Father      Social History     Socioeconomic History    Marital status: Single     Spouse name: Not on file    Number of children: Not on file    Years of education: Not on file    Highest education level: Not on file   Occupational History    Not on file   Tobacco Use    Smoking status: Never    Smokeless tobacco: Never   Substance and Sexual Activity    Alcohol use: Not Currently    Drug use: Never    Sexual activity: Not Currently   Other Topics Concern    Not on file   Social History Narrative     Not on file     Social Determinants of Health     Financial Resource Strain: Not on file   Food Insecurity: Not on file   Transportation Needs: Not on file   Physical Activity: Not on file   Stress: Not on file   Social Connections: Not on file   Intimate Partner Violence: Not on file   Housing Stability: Not on file     Current Facility-Administered Medications   Medication Dose Route Frequency Provider Last Rate Last Admin    amoxicillin-clavulanate (AUGMENTIN) 875-125 MG per tablet 1 tablet  1 tablet Oral 2 times per day Charna Elizabeth, APRN - CNP   1 tablet at 05/29/22 1127    hydrocortisone 1 % cream   Topical BID Charna Elizabeth, APRN -  CNP   Given at 05/29/22 1127    diphenhydrAMINE (BENADRYL) capsule 50 mg  50 mg Oral Q6H PRN Charna Elizabeth, APRN - CNP        HYDROmorphone HCl PF (DILAUDID) injection 1 mg  1 mg IntraVENous Q3H PRN Kerby Nora, APRN - NP   1 mg at 05/29/22 1342    0.45 % sodium chloride infusion   IntraVENous Continuous Kerby Nora, APRN - NP 150 mL/hr at 05/29/22 0901 New Bag at 05/29/22 0901    HYDROcodone-acetaminophen (NORCO) 5-325 MG per tablet 1 tablet  1 tablet Oral Q6H PRN Truitt Merle, DO   1 tablet at 05/29/22 1131    sodium chloride flush 0.9 % injection 5-40 mL  5-40 mL IntraVENous 2 times per day Truitt Merle, DO   10 mL at 05/29/22 0901    sodium chloride flush 0.9 % injection 5-40 mL  5-40 mL IntraVENous PRN Truitt Merle, DO        0.9 % sodium chloride infusion   IntraVENous PRN Truitt Merle, DO        ondansetron (ZOFRAN-ODT) disintegrating tablet 4 mg  4 mg Oral Q8H PRN Truitt Merle, DO        Or    ondansetron [facility]) injection 4 mg  4 mg IntraVENous Q6H PRN Truitt Merle, DO   4 mg at 05/27/22 2009    polyethylene glycol (GLYCOLAX) packet 17 g  17 g Oral Daily PRN Truitt Merle, DO        acetaminophen (TYLENOL) tablet 650 mg  650 mg Oral Q6H PRN Truitt Merle, DO        Or    acetaminophen (TYLENOL) suppository 650 mg  650 mg  Rectal Q6H PRN Truitt Merle, DO        cyclobenzaprine (FLEXERIL) tablet 10 mg  10 mg Oral BID PRN Truitt Merle, DO        FLUoxetine (PROZAC) capsule 40 mg  40 mg Oral Daily Truitt Merle, DO   40 mg at 05/29/22 0853    fluticasone (FLONASE) 50 MCG/ACT nasal spray 2 spray  2 spray Each Nostril Daily Truitt Merle, DO   2 spray at 56/21/30 8657    folic acid (FOLVITE) tablet 1 mg  1 mg Oral Daily Truitt Merle, DO   1 mg at 05/29/22 0854    hydroxyurea (HYDREA) chemo capsule 1,000 mg  1,000 mg Oral Daily Truitt Merle, DO   1,000 mg at 05/29/22 0858    levETIRAcetam (KEPPRA) tablet 1,500 mg  1,500 mg Oral BID Truitt Merle, DO   1,500 mg at 05/29/22 8469    rivaroxaban (XARELTO) tablet 20 mg  20 mg Oral Daily with breakfast Truitt Merle, DO   20 mg at 05/29/22 6295       OBJECTIVE:  Patient Vitals for the past 8 hrs:   BP Temp Temp src Pulse Resp SpO2   05/29/22 1445 (!) 101/57 98.4 F (36.9 C) Oral 80 18 94 %   05/29/22 1041 105/61 98.1 F (36.7 C) Oral 82 18 93 %     Temp (24hrs), Avg:98.3 F (36.8 C), Min:98 F (36.7 C), Max:98.6 F (37 C)    06/26 0701 - 06/26 1900  In: 120 [P.O.:120]  Out: 1300 [Urine:1300]    Physical Exam:  Constitutional: Well developed, well nourished female in no acute distress, sitting comfortably in the hospital bed.    HEENT: Normocephalic  and atraumatic. Sclerae anicteric. Neck supple without JVD. No thyromegaly present.    Skin Warm and dry.  No bruising and no rash noted.  No erythema.  No pallor. +prominent veins in b/l breast, lt breast tenderness with palpation   Respiratory Lungs are clear to auscultation bilaterally without wheezes, rales or rhonchi, normal air exchange without accessory muscle use.    CVS Normal rate, regular rhythm and normal S1 and S2.  No murmurs, gallops, or rubs.   Abdomen Soft, nontender and nondistended, normoactive bowel sounds.    Neuro Grossly nonfocal with no obvious sensory or motor deficits.   MSK Normal range of  motion in general.  No edema and no tenderness.   Psych Appropriate mood and affect.        Labs:    Recent Results (from the past 24 hour(s))   Magnesium    Collection Time: 05/29/22  5:40 AM   Result Value Ref Range    Magnesium 1.9 1.8 - 2.4 mg/dL   Comprehensive Metabolic Panel    Collection Time: 05/29/22  5:40 AM   Result Value Ref Range    Sodium 137 133 - 143 mmol/L    Potassium 3.9 3.5 - 5.1 mmol/L    Chloride 109 101 - 110 mmol/L    CO2 25 21 - 32 mmol/L    Anion Gap 3 2 - 11 mmol/L    Glucose 102 (H) 65 - 100 mg/dL    BUN 12 6 - 23 MG/DL    Creatinine 1.00 0.6 - 1.0 MG/DL    Est, Glom Filt Rate >60 >60 ml/min/1.21m    Calcium 8.7 8.3 - 10.4 MG/DL    Total Bilirubin 1.2 (H) 0.2 - 1.1 MG/DL    ALT 17 12 - 65 U/L    AST 22 15 - 37 U/L    Alk Phosphatase 91 50 - 136 U/L    Total Protein 7.1 6.3 - 8.2 g/dL    Albumin 3.3 (L) 3.5 - 5.0 g/dL    Globulin 3.8 2.8 - 4.5 g/dL    Albumin/Globulin Ratio 0.9 0.4 - 1.6     CBC with Auto Differential    Collection Time: 05/29/22  5:40 AM   Result Value Ref Range    WBC 4.9 4.3 - 11.1 K/uL    RBC 1.53 (L) 4.05 - 5.2 M/uL    Hemoglobin 6.8 (LL) 11.7 - 15.4 g/dL    Hematocrit 18.5 (L) 35.8 - 46.3 %    MCV 120.9 (H) 82 - 102 FL    MCH 44.4 (H) 26.1 - 32.9 PG    MCHC 36.8 (H) 31.4 - 35.0 g/dL    RDW 17.8 (H) 11.9 - 14.6 %    Platelets 358 150 - 450 K/uL    MPV 9.6 9.4 - 12.3 FL    nRBC 1.03 (H) 0.0 - 0.2 K/uL    Neutrophils % 53 43 - 78 %    Lymphocytes % 35 13 - 44 %    Monocytes % 9 4.0 - 12.0 %    Eosinophils % 3 0.5 - 7.8 %    Basophils % 0 0.0 - 2.0 %    Immature Granulocytes 0 0.0 - 5.0 %    Neutrophils Absolute 2.7 1.7 - 8.2 K/UL    Lymphocytes Absolute 1.7 0.5 - 4.6 K/UL    Monocytes Absolute 0.4 0.1 - 1.3 K/UL    Eosinophils Absolute 0.1 0.0 - 0.8 K/UL    Basophils Absolute 0.0 0.0 -  0.2 K/UL    Absolute Immature Granulocyte 0.0 0.0 - 0.5 K/UL    RBC Comment MODERATE  MACROCYTOSIS        RBC Comment OCCASIONAL  POLYCHROMASIA        RBC Comment  OCCASIONAL  OVALOCYTES        RBC Comment SLIGHT  ANISOCYTOSIS + POIKILOCYTOSIS        WBC Comment Result Confirmed By Smear      Platelet Comment LARGE FORMS PRESENT      Differential Type AUTOMATED     Reticulocytes    Collection Time: 05/29/22  5:40 AM   Result Value Ref Range    Reticulocyte Count,Automated 5.3 (H) 0.3 - 2.0 %    Absolute Retic # [id number] 0.026 - 0.095 M/ul    Immature Retic Fraction 26.9 (H) 3.0 - 15.9 %    Retic Hemoglobin conc. 39 (H) 29 - 35 pg       Imaging:  EXAMINATION: CT ANGIOGRAM CHEST WITH IV CONTRAST   CT CHEST PULMONARY EMBOLISM W   CONTRAST     DATE OF EXAMINATION:  05/27/2022 6:30 PM     INDICATION: dyspnea, recent travel and history of PE     COMPARISON: Chest radiograph 05/27/2022 and 12/29/2021. CT soft tissue neck   05/18/2021. A report from a CT chest study dated 11/21/2020 is available but no   images are currently available.     PROCEDURE: Water-soluble iodine containing x-ray contrast was administered   intravenously during arterial phase axial chest imaging, with 2-D sagittal and   coronal and 3-D MIP reformations.  CT dose lowering techniques were used, to   include: automated exposure control, adjustment for patient size, and or use of   iterative reconstruction.     FINDINGS:       Pulmonary Arteries: A pulmonary artery filling defect is not identified. In a   pulmonary artery is at upper limits of normal in size.     Cardiovascular:   Thoracic aorta normal.  No pericardial abnormality.  Mild   four-chamber cardiomegaly. A thin catheter within the right brachiocephalic vein   is again seen, partially imaged on 06/17/2021.      Mediastinum: Lymph nodes not enlarged. Extensive collaterals are also seen   throughout the mediastinum, including several prominent collaterals along the   upper esophagus, similar to 06/17/2021.     Lungs:  Scattered areas of dependent subsegmental atelectasis are seen within   the lungs. No focal consolidation. Central airways are patent. There is a 4  mm   nodule in the left upper lobe (series 2 image 58).       Pleura:   No effusion or pneumothorax.     Chest wall:  Prominent collaterals throughout the visualized chest wall are   again seen, previously seen on 06/17/2021 study. No focal soft tissue   abnormality. No enlarged axillary or supraclavicular lymph nodes.     Upper abdomen: There is a curvilinear hyperdense/partially calcified structure   in the left abdominal quadrant may represent an infarcted spleen. No acute   abnormality in the visualized upper abdomen.     Bones:  No acute osseous abnormality. Diffusely increased density of the   visualized osseous structures     IMPRESSION:     1.  No findings of pulmonary embolus.    2.  Scattered areas of subsegmental atelectasis within lungs. No focal   consolidation.  3.  Redemonstrated is extensive collateral vessels throughout the chest wall and  mediastinum, including several prominent vessels along the mid and upper   esophageal wall that appears similar to 06/17/2021.  4.  There is a 4 mm nodule in the left upper lobe. Comparison with prior imaging   may provide information regarding stability. Otherwise and optional follow-up   CT in 12 months is recommended to evaluate for stability per Fleischner Society   2017 guidelines for pulmonary nodules.  5.  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    TITLE: Upper extremity venous ultrasound examination.     INDICATION: Left upper extremity swelling.  History of venous thrombus.  History  of extensive left chest wall collateral veins.     TECHNIQUE:  Grayscale, Color Flow, Spectral Analysis, Doppler Interrogation  performed.     COMPARISON: CT 05/27/2022 and earlier.  Ultrasound 08/18/2017     FINDINGS: Patent right brachiocephalic and subclavian veins.     Patent left internal jugular, brachiocephalic, subclavian, axillary, basilic,  cephalic, radial, and ulnar veins.     There is  an IV in a brachial vein in the antecubital fossa with downstream  nonocclusive, thrombus.  The paired brachial veins in the arm are patent.     The posterior tibial and peroneal veins are compressible.     IMPRESSION:  Nonocclusive thrombus in a left brachial vein associated with an IV.  Although no central vein thrombus was identified on today's ultrasound  examination, the left brachiocephalic and subclavian veins do appear to be small diameter, consistent with nonocclusive thrombus.    ASSESSMENT:  Patient Active Problem List   Diagnosis    Hypercoagulable state ([place])    Seizure disorder (HCC)    Sickle cell anemia with crisis ([place])    Sickle cell disease ([place])    CAP (community acquired pneumonia)    Acute chest wall pain    Atopic rhinitis    Gastroesophageal reflux disease    Depression    Superior vena cava occlusion (HCC)    History of DVT (deep vein thrombosis)    Menorrhagia    Abnormal uterine bleeding    Symptomatic anemia    Sickle cell pain crisis (HCC)    Acute sinusitis    Sickle cell crisis (HCC)    Migraines    History of seizure    Mastitis    Bug bite          PLAN:  Sickle cell pain crisis / HbSS  - Continue Hydrea, folic acid  - Continue aggressive IVF  - Continue pain management - IV dilaudid 101m every 3 hrs and norco 550mevery 6 hrs PRN. Benadryl PO only  - Daily retic  - On RA.   6/26 pain is well controlled, Hgb 6.8, retic 5.3, immature retic 26.9     Hx of DVT  - Continue Xarelto 20 mg daily      Hx of seizures  - Continue Keppra     Concern for ? SVC Syndrome  - LUE intermittent numbness  - CT cervical spine WNL  - checking LUE doppler  - ? Consult vascular  6/26 CT Chest redemonstrated extensive collateral vessels throughout the chest wall and mediastinum, including several prominent vessels along the mid and upper esophageal wall that appears similar to 06/17/2021.  LUE doppler with nonocclusive thrombus in a left brachial vein associated with an IV.  Although no central vein thrombus  was identified on today's ultrasound examination, the left brachiocephalic and subclavian  veins do appear to be small diameter, consistent with nonocclusive thrombus.    Left breast edema and tenderness  6/26 start empiric Augmentin for mastitis     Bug bites/pruritis  6/26 continue with oral benadryl and add topical cream     LUL Pulmonary Nodule  - radiology recommends 12 month follow up.       [place] home meds  [place] SOPs  VTE prophylaxis - on Xarelto     Dispo - Anticipate DC home when pain controlled. FU with Dr [name] in office within 1 week of DC.              Florinda Marker) Armando Gang, FNP-BC  [facility] [place] [address]  Office : [phone]  Fax : [phone]   I personally saw, exammed and counselled the patient, and discussed with NP, agree with above history/assessment/plan. 46 y.o.female sickle cell disease admitted for for pain crisis, IV Dilaudid and Norco as above, Xarelto for history of DVT, Keppra for history of seizure, complaining of left breast swelling and exam showed eminent vein marks risk concern of SVC syndrome however CT chest is negative, empirically treat for mastitis, continue supportive care.      Dellia Cloud, M[place] [address]  Office : [phone]  Fax : [phone]

## 2022-05-29 NOTE — Progress Notes (Signed)
Placed 20g 1.75in PIV in right basilic vein with ultrasound assistance.

## 2022-05-30 LAB — COMPREHENSIVE METABOLIC PANEL
ALT: 15 U/L (ref 12–65)
AST: 24 U/L (ref 15–37)
Albumin/Globulin Ratio: 0.9 (ref 0.4–1.6)
Albumin: 3.3 g/dL — ABNORMAL LOW (ref 3.5–5.0)
Alk Phosphatase: 91 U/L (ref 50–136)
Anion Gap: 5 mmol/L (ref 2–11)
BUN: 8 MG/DL (ref 6–23)
CO2: 22 mmol/L (ref 21–32)
Calcium: 8.3 MG/DL (ref 8.3–10.4)
Chloride: 110 mmol/L (ref 101–110)
Creatinine: 0.8 MG/DL (ref 0.6–1.0)
Est, Glom Filt Rate: >60 mL/min/{1.73_m2} (ref >60)
Globulin: 3.8 g/dL (ref 2.8–4.5)
Glucose: 106 mg/dL — ABNORMAL HIGH (ref 65–100)
Potassium: 3.9 mmol/L (ref 3.5–5.1)
Sodium: 137 mmol/L (ref 133–143)
Total Bilirubin: 1.4 MG/DL — ABNORMAL HIGH (ref 0.2–1.1)
Total Protein: 7.1 g/dL (ref 6.3–8.2)

## 2022-05-30 LAB — CBC WITH AUTO DIFFERENTIAL
Absolute Immature Granulocyte: 0 10*3/uL (ref 0.0–0.5)
Basophils %: 0 % (ref 0.0–2.0)
Basophils Absolute: 0 10*3/uL (ref 0.0–0.2)
Eosinophils %: 2 % (ref 0.5–7.8)
Eosinophils Absolute: 0.1 10*3/uL (ref 0.0–0.8)
Hematocrit: 17.6 % — ABNORMAL LOW (ref 35.8–46.3)
Hemoglobin: 6.3 g/dL — CL (ref 11.7–15.4)
Immature Granulocytes: 0 % (ref 0.0–5.0)
Lymphocytes %: 25 % (ref 13–44)
Lymphocytes Absolute: 1.3 10*3/uL (ref 0.5–4.6)
MCH: 43.8 PG — ABNORMAL HIGH (ref 26.1–32.9)
MCHC: 35.8 g/dL — ABNORMAL HIGH (ref 31.4–35.0)
MCV: 122.2 FL — ABNORMAL HIGH (ref 82–102)
MPV: 9.4 FL (ref 9.4–12.3)
Monocytes %: 13 % — ABNORMAL HIGH (ref 4.0–12.0)
Monocytes Absolute: 0.7 10*3/uL (ref 0.1–1.3)
Neutrophils %: 60 % (ref 43–78)
Neutrophils Absolute: 3 10*3/uL (ref 1.7–8.2)
Platelet Comment: ADEQUATE
Platelets: 314 10*3/uL (ref 150–450)
RBC: 1.44 M/uL — ABNORMAL LOW (ref 4.05–5.2)
RDW: 18.6 % — ABNORMAL HIGH (ref 11.9–14.6)
WBC: 5.1 10*3/uL (ref 4.3–11.1)
nRBC: 0.9 10*3/uL — ABNORMAL HIGH (ref 0.0–0.2)

## 2022-05-30 LAB — PREPARE RBC (CROSSMATCH)

## 2022-05-30 LAB — RETICULOCYTES
Absolute Retic #: 0.1096 M/ul — ABNORMAL HIGH (ref 0.026–0.095)
Immature Retic Fraction: 20 % — ABNORMAL HIGH (ref 3.0–15.9)
Retic Hemoglobin conc.: 34 pg (ref 29–35)
Reticulocyte Count,Automated: 7.6 % — ABNORMAL HIGH (ref 0.3–2.0)

## 2022-05-30 LAB — MAGNESIUM: Magnesium: 1.6 mg/dL — ABNORMAL LOW (ref 1.8–2.4)

## 2022-05-30 MED FILL — HYDROMORPHONE HCL 1 MG/ML IJ SOLN: 1 MG/ML | INTRAMUSCULAR | Qty: 1

## 2022-05-30 MED FILL — FLUOXETINE HCL 20 MG PO CAPS: 20 MG | ORAL | Qty: 2

## 2022-05-30 MED FILL — AMOXICILLIN-POT CLAVULANATE 875-125 MG PO TABS: 875-125 MG | ORAL | Qty: 1

## 2022-05-30 MED FILL — DIPHENHYDRAMINE HCL 25 MG PO CAPS: 25 MG | ORAL | Qty: 2

## 2022-05-30 MED FILL — XARELTO 20 MG PO TABS: 20 MG | ORAL | Qty: 1

## 2022-05-30 MED FILL — LEVETIRACETAM 500 MG PO TABS: 500 MG | ORAL | Qty: 3

## 2022-05-30 MED FILL — HYDROXYUREA 500 MG PO CAPS: 500 MG | ORAL | Qty: 2

## 2022-05-30 MED FILL — FOLIC ACID 1 MG PO TABS: 1 MG | ORAL | Qty: 1

## 2022-05-30 NOTE — Progress Notes (Incomplete)
EOS:    -pt given dilaudid 1x for pain  -pt resting in bed  -no needs at this time  -vss

## 2022-05-30 NOTE — Progress Notes (Signed)
I have discussed with the patient the rationale for blood component transfusion; its benefits in treating or preventing fatigue, organ damage, or death; and its risk which includes mild transfusion reactions, rare risk of blood borne infection, or more serious but rare reactions. I have discussed the alternatives to transfusion, including the risk and consequences of not receiving transfusion. The patient had an opportunity to ask questions and had agreed to proceed with transfusion of blood components.

## 2022-05-30 NOTE — Progress Notes (Signed)
[facility]        Inpatient Hematology / Oncology Progress Note    Reason for Admission:  Sickle cell crisis ([place]) [D57.00]    24 Hour Events:  Afeb, VSS  Hgb 6.3, retic 7.6  Pain controlled  Left breast edema/tenderness with slight improvement  Pruritis-bug bites improved         ROS:  Constitutional: Positive for fatigue; negative for fever, chills, weakness, malaise.  CV: Negative for chest pain, palpitations, edema.  Respiratory: Negative for dyspnea, cough, wheezing.  GI: Negative for nausea, abdominal pain, diarrhea.    10 point review of systems is otherwise negative with the exception of the elements mentioned above in the HPI.       Allergies   Allergen Reactions    Latex Rash    Ceftriaxone Other (See Comments)    Fentanyl Other (See Comments)     "my doctor said I had a stroke from it"    Influenza Vaccines Itching     unknown      Influenza Virus Vaccine Swelling    Meperidine Other (See Comments)    Morphine Itching    Oxycodone Other (See Comments)    Sulfa Antibiotics Rash     Past Medical History:   Diagnosis Date    Arthritis     Hx of blood clots     Migraines     Seizures (HCC)     Sickle cell disease ([place])     Stroke ([place])      Past Surgical History:   Procedure Laterality Date    CESAREAN SECTION      CHOLECYSTECTOMY      LAPAROTOMY      TUBAL LIGATION      VASCULAR SURGERY       Family History   Problem Relation Age of Onset    Diabetes Mother     Hypertension Mother     Diabetes Father     Hypertension Father      Social History     Socioeconomic History    Marital status: Single     Spouse name: Not on file    Number of children: Not on file    Years of education: Not on file    Highest education level: Not on file   Occupational History    Not on file   Tobacco Use    Smoking status: Never    Smokeless tobacco: Never   Substance and Sexual Activity    Alcohol use: Not Currently    Drug use: Never    Sexual activity: Not Currently   Other Topics Concern    Not on  file   Social History Narrative    Not on file     Social Determinants of Health     Financial Resource Strain: Not on file   Food Insecurity: Not on file   Transportation Needs: Not on file   Physical Activity: Not on file   Stress: Not on file   Social Connections: Not on file   Intimate Partner Violence: Not on file   Housing Stability: Not on file     Current Facility-Administered Medications   Medication Dose Route Frequency Provider Last Rate Last Admin    amoxicillin-clavulanate (AUGMENTIN) 875-125 MG per tablet 1 tablet  1 tablet Oral 2 times per day Charna Elizabeth, APRN - CNP   1 tablet at 05/30/22 0809    hydrocortisone 1 % cream   Topical BID Judeth Porch  Dannielle Burn, APRN - CNP   Given at 05/30/22 5366    diphenhydrAMINE (BENADRYL) capsule 50 mg  50 mg Oral Q6H PRN Charna Elizabeth, APRN - CNP   50 mg at 05/29/22 2342    HYDROmorphone HCl PF (DILAUDID) injection 1 mg  1 mg IntraVENous Q3H PRN Kerby Nora, APRN - NP   1 mg at 05/30/22 0809    0.45 % sodium chloride infusion   IntraVENous Continuous Kerby Nora, APRN - NP 150 mL/hr at 05/30/22 0300 New Bag at 05/30/22 0300    HYDROcodone-acetaminophen (NORCO) 5-325 MG per tablet 1 tablet  1 tablet Oral Q6H PRN Truitt Merle, DO   1 tablet at 05/29/22 1942    sodium chloride flush 0.9 % injection 5-40 mL  5-40 mL IntraVENous 2 times per day Truitt Merle, DO   10 mL at 05/30/22 0809    sodium chloride flush 0.9 % injection 5-40 mL  5-40 mL IntraVENous PRN Truitt Merle, DO        0.9 % sodium chloride infusion   IntraVENous PRN Truitt Merle, DO        ondansetron (ZOFRAN-ODT) disintegrating tablet 4 mg  4 mg Oral Q8H PRN Truitt Merle, DO        Or    ondansetron [facility]) injection 4 mg  4 mg IntraVENous Q6H PRN Truitt Merle, DO   4 mg at 05/27/22 2009    polyethylene glycol (GLYCOLAX) packet 17 g  17 g Oral Daily PRN Truitt Merle, DO        acetaminophen (TYLENOL) tablet 650 mg  650 mg Oral Q6H PRN Truitt Merle, DO        Or     acetaminophen (TYLENOL) suppository 650 mg  650 mg Rectal Q6H PRN Truitt Merle, DO        cyclobenzaprine (FLEXERIL) tablet 10 mg  10 mg Oral BID PRN Truitt Merle, DO        FLUoxetine (PROZAC) capsule 40 mg  40 mg Oral Daily Truitt Merle, DO   40 mg at 05/30/22 0808    fluticasone (FLONASE) 50 MCG/ACT nasal spray 2 spray  2 spray Each Nostril Daily Truitt Merle, DO   2 spray at 44/03/47 4259    folic acid (FOLVITE) tablet 1 mg  1 mg Oral Daily Truitt Merle, DO   1 mg at 05/30/22 5638    hydroxyurea (HYDREA) chemo capsule 1,000 mg  1,000 mg Oral Daily Truitt Merle, DO   1,000 mg at 05/30/22 7564    levETIRAcetam (KEPPRA) tablet 1,500 mg  1,500 mg Oral BID Truitt Merle, DO   1,500 mg at 05/30/22 3329    rivaroxaban (XARELTO) tablet 20 mg  20 mg Oral Daily with breakfast Truitt Merle, DO   20 mg at 05/30/22 0809       OBJECTIVE:  Patient Vitals for the past 8 hrs:   BP Temp Temp src Pulse Resp SpO2   05/30/22 0809 -- -- -- -- 17 --   05/30/22 0759 116/74 98.6 F (37 C) Oral 92 15 95 %     Temp (24hrs), Avg:98.4 F (36.9 C), Min:98 F (36.7 C), Max:98.6 F (37 C)    06/27 0701 - 06/27 1900  In: 240 [P.O.:240]  Out: 500 [Urine:500]    Physical Exam:  Constitutional: Well developed, well nourished female in no acute distress, sitting comfortably in the hospital bed.  HEENT: Normocephalic and atraumatic. Sclerae anicteric. Neck supple without JVD. No thyromegaly present.    Skin Warm and dry.  No bruising and no rash noted.  No erythema.  No pallor. +prominent veins in b/l breast, lt breast tenderness with palpation   Respiratory Lungs are clear to auscultation bilaterally without wheezes, rales or rhonchi, normal air exchange without accessory muscle use.    CVS Normal rate, regular rhythm and normal S1 and S2.  No murmurs, gallops, or rubs.   Abdomen Soft, nontender and nondistended, normoactive bowel sounds.    Neuro Grossly nonfocal with no obvious sensory or motor deficits.   MSK  Normal range of motion in general.  No edema and no tenderness.   Psych Appropriate mood and affect.        Labs:    Recent Results (from the past 24 hour(s))   Magnesium    Collection Time: 05/30/22 10:24 AM   Result Value Ref Range    Magnesium 1.6 (L) 1.8 - 2.4 mg/dL   Comprehensive Metabolic Panel    Collection Time: 05/30/22 10:24 AM   Result Value Ref Range    Sodium 137 133 - 143 mmol/L    Potassium 3.9 3.5 - 5.1 mmol/L    Chloride 110 101 - 110 mmol/L    CO2 22 21 - 32 mmol/L    Anion Gap 5 2 - 11 mmol/L    Glucose 106 (H) 65 - 100 mg/dL    BUN 8 6 - 23 MG/DL    Creatinine 0.80 0.6 - 1.0 MG/DL    Est, Glom Filt Rate >60 >60 ml/min/1.93m    Calcium 8.3 8.3 - 10.4 MG/DL    Total Bilirubin 1.4 (H) 0.2 - 1.1 MG/DL    ALT 15 12 - 65 U/L    AST 24 15 - 37 U/L    Alk Phosphatase 91 50 - 136 U/L    Total Protein 7.1 6.3 - 8.2 g/dL    Albumin 3.3 (L) 3.5 - 5.0 g/dL    Globulin 3.8 2.8 - 4.5 g/dL    Albumin/Globulin Ratio 0.9 0.4 - 1.6     CBC with Auto Differential    Collection Time: 05/30/22 10:24 AM   Result Value Ref Range    WBC 5.1 4.3 - 11.1 K/uL    RBC 1.44 (L) 4.05 - 5.2 M/uL    Hemoglobin 6.3 (LL) 11.7 - 15.4 g/dL    Hematocrit 17.6 (L) 35.8 - 46.3 %    MCV 122.2 (H) 82 - 102 FL    MCH 43.8 (H) 26.1 - 32.9 PG    MCHC 35.8 (H) 31.4 - 35.0 g/dL    RDW 18.6 (H) 11.9 - 14.6 %    Platelets 314 150 - 450 K/uL    MPV 9.4 9.4 - 12.3 FL    nRBC 0.90 (H) 0.0 - 0.2 K/uL    Differential Type PENDING    Reticulocytes    Collection Time: 05/30/22 10:24 AM   Result Value Ref Range    Reticulocyte Count,Automated 7.6 (H) 0.3 - 2.0 %    Absolute Retic # 0.1096 (H) 0.026 - 0.095 M/ul    Immature Retic Fraction 20.0 (H) 3.0 - 15.9 %    Retic Hemoglobin conc. 34 29 - 35 pg         Imaging:  EXAMINATION: CT ANGIOGRAM CHEST WITH IV CONTRAST   CT CHEST PULMONARY EMBOLISM W   CONTRAST     DATE OF EXAMINATION:  05/27/2022 6:30 PM  INDICATION: dyspnea, recent travel and history of PE     COMPARISON: Chest radiograph 05/27/2022 and  12/29/2021. CT soft tissue neck   05/18/2021. A report from a CT chest study dated 11/21/2020 is available but no   images are currently available.     PROCEDURE: Water-soluble iodine containing x-ray contrast was administered   intravenously during arterial phase axial chest imaging, with 2-D sagittal and   coronal and 3-D MIP reformations.  CT dose lowering techniques were used, to   include: automated exposure control, adjustment for patient size, and or use of   iterative reconstruction.     FINDINGS:       Pulmonary Arteries: A pulmonary artery filling defect is not identified. In a   pulmonary artery is at upper limits of normal in size.     Cardiovascular:   Thoracic aorta normal.  No pericardial abnormality.  Mild   four-chamber cardiomegaly. A thin catheter within the right brachiocephalic vein   is again seen, partially imaged on 06/17/2021.      Mediastinum: Lymph nodes not enlarged. Extensive collaterals are also seen   throughout the mediastinum, including several prominent collaterals along the   upper esophagus, similar to 06/17/2021.     Lungs:  Scattered areas of dependent subsegmental atelectasis are seen within   the lungs. No focal consolidation. Central airways are patent. There is a 4 mm   nodule in the left upper lobe (series 2 image 58).       Pleura:   No effusion or pneumothorax.     Chest wall:  Prominent collaterals throughout the visualized chest wall are   again seen, previously seen on 06/17/2021 study. No focal soft tissue   abnormality. No enlarged axillary or supraclavicular lymph nodes.     Upper abdomen: There is a curvilinear hyperdense/partially calcified structure   in the left abdominal quadrant may represent an infarcted spleen. No acute   abnormality in the visualized upper abdomen.     Bones:  No acute osseous abnormality. Diffusely increased density of the   visualized osseous structures     IMPRESSION:     1.  No findings of pulmonary embolus.    2.  Scattered areas of  subsegmental atelectasis within lungs. No focal   consolidation.  3.  Redemonstrated is extensive collateral vessels throughout the chest wall and   mediastinum, including several prominent vessels along the mid and upper   esophageal wall that appears similar to 06/17/2021.  4.  There is a 4 mm nodule in the left upper lobe. Comparison with prior imaging   may provide information regarding stability. Otherwise and optional follow-up   CT in 12 months is recommended to evaluate for stability per Fleischner Society   2017 guidelines for pulmonary nodules.  5.  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    TITLE: Upper extremity venous ultrasound examination.     INDICATION: Left upper extremity swelling.  History of venous thrombus.  History  of extensive left chest wall collateral veins.     TECHNIQUE:  Grayscale, Color Flow, Spectral Analysis, Doppler Interrogation  performed.     COMPARISON: CT 05/27/2022 and earlier.  Ultrasound 08/18/2017     FINDINGS: Patent right brachiocephalic and subclavian veins.     Patent left internal jugular, brachiocephalic, subclavian, axillary, basilic,  cephalic, radial, and ulnar veins.     There is an IV in a brachial vein in the antecubital  fossa with downstream  nonocclusive, thrombus.  The paired brachial veins in the arm are patent.     The posterior tibial and peroneal veins are compressible.     IMPRESSION:  Nonocclusive thrombus in a left brachial vein associated with an IV.  Although no central vein thrombus was identified on today's ultrasound  examination, the left brachiocephalic and subclavian veins do appear to be small diameter, consistent with nonocclusive thrombus.    ASSESSMENT:  Patient Active Problem List   Diagnosis    Hypercoagulable state ([place])    Seizure disorder (HCC)    Sickle cell anemia with crisis ([place])    Sickle cell disease ([place])    CAP (community acquired pneumonia)    Acute  chest wall pain    Atopic rhinitis    Gastroesophageal reflux disease    Depression    Superior vena cava occlusion (HCC)    History of DVT (deep vein thrombosis)    Menorrhagia    Abnormal uterine bleeding    Symptomatic anemia    Sickle cell pain crisis (HCC)    Acute sinusitis    Sickle cell crisis (HCC)    Migraines    History of seizure    Mastitis    Bug bite          PLAN:  Sickle cell pain crisis / HbSS  - Continue Hydrea, folic acid  - Continue aggressive IVF  - Continue pain management - IV dilaudid 30m every 3 hrs and norco 588mevery 6 hrs PRN. Benadryl PO only  - Daily retic  - On RA.   6/26 pain is well controlled, Hgb 6.8, retic 5.3, immature retic 26.9  6/27 received Dilaudid x5 and Norco x3 in past 24 hours, Hgb down to 6.3-transfuse 1 unit PRBCs     Hx of DVT  - Continue Xarelto 20 mg daily      Hx of seizures  - Continue Keppra     Concern for ? SVC Syndrome  - LUE intermittent numbness  - CT cervical spine WNL  - checking LUE doppler  - ? Consult vascular  6/26 CT Chest redemonstrated extensive collateral vessels throughout the chest wall and mediastinum, including several prominent vessels along the mid and upper esophageal wall that appears similar to 06/17/2021.  LUE doppler with nonocclusive thrombus in a left brachial vein associated with an IV.  Although no central vein thrombus was identified on today's ultrasound examination, the left brachiocephalic and subclavian veins do appear to be small diameter, consistent with nonocclusive thrombus.    Left breast edema and tenderness  6/26 start empiric Augmentin for mastitis   6/27 D2 Augmentin-reports a little improvement    Bug bites/pruritis  6/26 continue with oral benadryl and add topical cream  6/27 improved     LUL Pulmonary Nodule  - radiology recommends 12 month follow up.       [place] home meds  [place] SOPs  VTE prophylaxis - on Xarelto     Dispo - Anticipate DC home when pain controlled. FU with Dr KhHumphrey Rollsn office within 1 week of  DC.              LiFlorinda MarkerNiArmando GangFNP-BC  [facility]  [address] [place] [id number] : [phone] : [phone] I personally saw, exammed and counselled the patient, and discussed with NP, agree with above history/assessment/plan. [age].o.female sickle cell disease admitted for for  pain crisis, IV Dilaudid and Norco as above, Xarelto for history of DVT, Keppra for history of seizure, complaining of left breast swelling and exam showed eminent vein marks risk concern of SVC syndrome however CT chest is negative, empirically treat for mastitis and reports improvement today, continue supportive care.       Dellia Cloud, M.[place] [address]  Office : [phone]  Fax : [phone]

## 2022-05-30 NOTE — Other (Signed)
Review Status Review Entered   In Primary 05/31/2022 1639       Created By   Allena Katz, RN      Criteria Review   DATE: 05/31/22     PERTINENT UPDATES:  Oncology: Left breast edema/tenderness. Received Dilaudid x 6 and Norco x 1 in past 24 hours, Encouraged use of PO vs IV pain meds, increased Norco to Q6H     Pain Scale: 5,8,4,8,5,8,6/10 Generalized     VITALS:  T: 99.1 F  RR: 20, 18  HR: 88  BP: 95/54, 86/54  sp02: 92% ra     ABNL/PERTINENT LABS:  05/31/22 available as of 06:03  Chloride: 112 (H)  BILIRUBIN TOTAL: 1.5 (H)  WBC: 5.5  RBC: 1.97 (L)  Hemoglobin Quant: 7.7 (L)  Hematocrit: 21.5 (L)  MCV: 109.1 (H)  MCH: 39.1 (H)  MCHC: 35.8 (H)  RDW: 28.6 (H)  Platelet Count: 366  Nucleated Red Blood Cells: 0.86 (H)     Reticulocyte Count,Automated: 7.1 (H)  Immature Retic Fraction: 19.9 (H)  Absolute Retic #: 0.1405 (H)     PHYSICAL EXAM:  Skin     Warm and dry.  No bruising and no rash noted.  No erythema.  No pallor. +prominent veins in b/l breast, lt breast tenderness with palpation-improved  Neuro  Grossly nonfocal with no obvious sensory or motor deficits.  MSK    Normal range of motion in general.  No edema and no tenderness.     MD CONSULTS/ASSESSMENT AND PLAN:  Oncology:  Sickle cell pain crisis / HbSS  - Continue Hydrea, folic acid  - Continue aggressive IVF  - Continue pain management  - Daily retic  - On RA.   Hx of DVT  - Continue Xarelto daily   Hx of seizures  - Continue Keppra  Concern for ? SVC Syndrome  - LUE intermittent numbness  - CT cervical spine WNL  - ? Consult vascular  Left breast edema and tenderness  -Started empiric Augmentin for mastitis (Day3)  Bug bites/pruritis  -Continue with oral benadryl and add topical cream  LUL Pulmonary Nodule  - Radiology recommends 12 month follow up.    C[place] home meds  [place] SOPs  VTE prophylaxis - on Xarelto  Dispo - Anticipate DC home when pain controlled. Will focus on using PO vs IV pain meds     MEDICATIONS:  PO augmentin 875-125mg  1 tab 2x  daily  TOP hydrocortisone 1% cream 2x daily  PO hydrea 1,000mg  daily  PO keppra 1,500mg  2x daily  PO xarelto 20mg  daily  IVF continuous 0.45%nacl @150ml /hr  PO norco 5-325mg  2tablet q6hrs prn given 1x  IV dilaudid 1mg  q3hrs prn given 3x     ORDERS:  ADULT DIET; Regular   Initiate Oxygen Therapy Protocol prn  Up with assistance      PT/OT/SLP/CM ASSESSMENT OR NOTES:  CM:  Chart screened by CM for d/c planning.Per most recent oncology progress note working on weaning pt off of IV pain medication to PO.  Pt is independent with ADLs at baseline.  There have been no PT/OT consults ordered.Pt to d/c home once pain is controlled and weaned off of IV pain meds.No d/c needs identified at the present time.CM will continue to follow.LOS = 4 days        6/27 CSR by , RN       Review Status Review Entered   In Primary 05/31/2022 1627       Created By  Allena Katz, RN      Criteria Review   DATE: 05/30/22     PERTINENT UPDATES:  Oncology: Left breast edema/tenderness with slight improvement. Received Dilaudid x5 and Norco x3 in past 24 hours, Hgb dropped-transfused 1 unit PRBCs     Pain Scale: 8,4,10,10,10,10,9,5,8/10 Generalized     VITALS:  T: 99 F  RR: 18, 20  HR: 95  BP: 95/51, 95/54  sp02: 93% ra     ABNL/PERTINENT LABS:  Magnesium: 1.6 (L)  Glucose, Random: 106 (H)  Total Protein: 7.1  Albumin: 3.3 (L)  BILIRUBIN TOTAL: 1.4 (H)  WBC: 5.1  RBC: 1.44 (L)  Hemoglobin Quant: 6.3 (LL)  Hematocrit: 17.6 (L)  MCV: 122.2 (H)  MCH: 43.8 (H)  MCHC: 35.8 (H)  RDW: 18.6 (H)  Platelet Count: 314  Monocytes %: 13 (H)  Nucleated Red Blood Cells: 0.90 (H)  Retic Hgb Conc.: 34     Reticulocyte Count,Automated: 7.6 (H)  Immature Retic Fraction: 20.0 (H)  Absolute Retic #: 0.1096 (H)     ABO Rh: O POSITIVE  Antibody Screen: NEG  Crossmatch Result: Compatible  Unit Number: [id number]     PHYSICAL EXAM:  Skin     Warm and dry.  No bruising and no rash noted.  No erythema.  No pallor. +prominent veins in b/l breast, lt breast  tenderness with palpation  Respiratory      Lungs are clear to auscultation bilaterally without wheezes, rales or rhonchi, normal air exchange without accessory muscle use.   CVS     Normal rate, regular rhythm and normal S1 and S2.  No murmurs, gallops, or rubs.     MD CONSULTS/ASSESSMENT AND PLAN:  Oncology:  Sickle cell pain crisis / HbSS  - Continue Hydrea, folic acid  - Continue aggressive IVF  - Continue pain management - IV dilaudid every 3 hrs and norco every 6 hrs PRN. Benadryl PO only  - Daily retic  - On RA.   Hx of DVT  - Continue Xarelto daily   Hx of seizures  - Continue Keppra  Concern for ? SVC Syndrome  - LUE intermittent numbness  - CT cervical spine WNL  - ? Consult vascular  Left breast edema and tenderness  -Started empiric Augmentin for mastitis   Bug bites/pruritis  -Continue with oral benadryl and add topical cream  LUL Pulmonary Nodule  - Radiology recommends 12 month follow up.    [place] home meds  [place] SOPs  VTE prophylaxis - on Xarelto  Dispo - Anticipate DC home when pain controlled.     MEDICATIONS:  PO augmentin 875-125mg  1 tab 2x daily  TOP hydrocortisone 1% cream 2x daily  PO hydrea 1,000mg  daily  PO keppra 1,500mg  2x daily  PO xarelto 20mg  daily  IVF continuous 0.45%nacl @150ml /hr  PO benadryl 50mg  q6hrs prn given 1x  PO norco 5-325mg  1 tab q6hrs prn given 1x  IV dilaudid 1mg  q3hrs prn given 5x     ORDERS:  ADULT DIET; Regular   Initiate Oxygen Therapy Protocol prn  Up with assistance            6/26 CSR by , RN       Review Status Review Entered   In Primary 05/31/2022 1613       Created By   , RN      Criteria Review   DATE: 05/29/22     PERTINENT UPDATES:  Oncology: Patient has Left breast edema/tenderness. Continue pain management -  IV dilaudid every 3 hrs and norco every 6 hrs PRN.     Hospitalist: Oncology has assumed care on their patient as primary.  Hospitalist will sign off at this time.        Pain Scale: 8,3,9,6,9,8,10,10,8,6,8,5/10 Arm,  Back  Weight:     VITALS:  T: 98.6 F  RR: 18, 20  HR: 88  BP: 90/56, 93/53, 101/57  sp02: 92% ra     ABNL/PERTINENT LABS:  Glucose, Random: 102 (H)  Albumin: 3.3 (L)  BILIRUBIN TOTAL: 1.2 (H)  WBC: 4.9  RBC: 1.53 (L)  Hemoglobin Quant: 6.8 (LL)  Hematocrit: 18.5 (L)  MCV: 120.9 (H)  MCH: 44.4 (H)  MCHC: 36.8 (H)  RDW: 17.8 (H)  Platelet Count: 358  Nucleated Red Blood Cells: 1.03 (H)  Retic Hgb Conc.: 39 (H)     Reticulocyte Count,Automated: 5.3 (H)  Immature Retic Fraction: 26.9 (H)  Absolute Retic #: 0.0812     PHYSICAL EXAM:  Skin: Warm and dry.  No bruising and no rash noted.  No erythema.  No pallor. +prominent veins in b/l breast, lt breast tenderness with palpation  Respiratory: Lungs are clear to auscultation bilaterally without wheezes, rales or rhonchi, normal air exchange without accessory muscle use.   CVS: Normal rate, regular rhythm and normal S1 and S2.  No murmurs, gallops, or rubs.  Neuro: Grossly nonfocal with no obvious sensory or motor deficits.     MD CONSULTS/ASSESSMENT AND PLAN:  Oncology:  Hypercoagulable state   CAP  Acute chest wall pain  Atopic rhinitis  Gastroesophageal reflux disease  Depression  Superior vena cava occlusion   Menorrhagia  Abnormal uterine bleeding  Symptomatic anemia  Acute sinusitis  Migraines  Sickle cell pain crisis / HbSS  - Continue Hydrea, folic acid  - Continue aggressive IVF  - Daily retic  - On RA.   Hx of DVT  - Continue Xarelto daily   Hx of seizures  - Continue Keppra  Concern for ? SVC Syndrome  - LUE intermittent numbness  - CT cervical spine WNL  - checking LUE doppler  - ? Consult vascular  Left breast edema and tenderness  -Start empiric Augmentin for mastitis   Bug bites/pruritis  -Continue with oral benadryl and add topical cream  LUL Pulmonary Nodule  - radiology recommends 12 month follow up.    [place] home meds  [place] SOPs  VTE prophylaxis     MEDICATIONS:  PO augmentin 875-125mg  1 tab 2x daily  TOP hydrocortisone 1% cream 2x daily  PO hydrea  1,000mg  daily  PO xarelto 20mg  daily  IVF continuous 0.45%nacl @150ml /hr  PO keppra 1,500mg  2x daily  PO benadryl 50mg  q6hrs prn given 1x  PO norco 5-325mg  1 tab q6hrs prn given 3x  IV dilaudid 1mg  q3hrs prn given 4x     ORDERS:  ADULT DIET; Regular   Initiate Oxygen Therapy Protocol prn  Up with assistance      PT/OT/SLP/CM ASSESSMENT OR NOTES:  CM:  Chart screened for discharge planning.  No needs identified at this time.  Please consult if any new issues arise.

## 2022-05-30 NOTE — Progress Notes (Signed)
EOS 7P-7A:    -pt given norco 1x for pain  -pt given dilaudid 2x for pain  -pt resting in bed  -no needs at this time  -vss

## 2022-05-31 LAB — COMPREHENSIVE METABOLIC PANEL
ALT: 16 U/L (ref 12–65)
AST: 25 U/L (ref 15–37)
Albumin/Globulin Ratio: 0.9 (ref 0.4–1.6)
Albumin: 3.5 g/dL (ref 3.5–5.0)
Alk Phosphatase: 99 U/L (ref 50–136)
Anion Gap: 5 mmol/L (ref 2–11)
BUN: 9 MG/DL (ref 6–23)
CO2: 22 mmol/L (ref 21–32)
Calcium: 8.4 MG/DL (ref 8.3–10.4)
Chloride: 112 mmol/L — ABNORMAL HIGH (ref 101–110)
Creatinine: 0.9 MG/DL (ref 0.6–1.0)
Est, Glom Filt Rate: >60 mL/min/{1.73_m2} (ref >60)
Globulin: 4 g/dL (ref 2.8–4.5)
Glucose: 97 mg/dL (ref 65–100)
Potassium: 4 mmol/L (ref 3.5–5.1)
Sodium: 139 mmol/L (ref 133–143)
Total Bilirubin: 1.5 MG/DL — ABNORMAL HIGH (ref 0.2–1.1)
Total Protein: 7.5 g/dL (ref 6.3–8.2)

## 2022-05-31 LAB — CBC WITH AUTO DIFFERENTIAL
Absolute Immature Granulocyte: 0 10*3/uL (ref 0.0–0.5)
Basophils %: 0 % (ref 0.0–2.0)
Basophils Absolute: 0 10*3/uL (ref 0.0–0.2)
Eosinophils %: 2 % (ref 0.5–7.8)
Eosinophils Absolute: 0.1 10*3/uL (ref 0.0–0.8)
Hematocrit: 21.5 % — ABNORMAL LOW (ref 35.8–46.3)
Hemoglobin: 7.7 g/dL — ABNORMAL LOW (ref 11.7–15.4)
Immature Granulocytes: 0 % (ref 0.0–5.0)
Lymphocytes %: 38 % (ref 13–44)
Lymphocytes Absolute: 2.1 10*3/uL (ref 0.5–4.6)
MCH: 39.1 PG — ABNORMAL HIGH (ref 26.1–32.9)
MCHC: 35.8 g/dL — ABNORMAL HIGH (ref 31.4–35.0)
MCV: 109.1 FL — ABNORMAL HIGH (ref 82–102)
MPV: 9.7 FL (ref 9.4–12.3)
Monocytes %: 11 % (ref 4.0–12.0)
Monocytes Absolute: 0.6 10*3/uL (ref 0.1–1.3)
Neutrophils %: 49 % (ref 43–78)
Neutrophils Absolute: 2.7 10*3/uL (ref 1.7–8.2)
Platelets: 366 10*3/uL (ref 150–450)
RBC: 1.97 M/uL — ABNORMAL LOW (ref 4.05–5.2)
RDW: 28.6 % — ABNORMAL HIGH (ref 11.9–14.6)
WBC: 5.5 10*3/uL (ref 4.3–11.1)
nRBC: 0.86 10*3/uL — ABNORMAL HIGH (ref 0.0–0.2)

## 2022-05-31 LAB — RETICULOCYTES
Absolute Retic #: 0.1405 M/ul — ABNORMAL HIGH (ref 0.026–0.095)
Immature Retic Fraction: 19.9 % — ABNORMAL HIGH (ref 3.0–15.9)
Retic Hemoglobin conc.: 35 pg (ref 29–35)
Reticulocyte Count,Automated: 7.1 % — ABNORMAL HIGH (ref 0.3–2.0)

## 2022-05-31 LAB — TYPE AND SCREEN
ABO/Rh: O POS
Antibody Screen: NEGATIVE
Antigen/Antibody: NEGATIVE
Dispense Status Blood Bank: TRANSFUSED
Unit Divison: 0

## 2022-05-31 LAB — MAGNESIUM: Magnesium: 1.8 mg/dL (ref 1.8–2.4)

## 2022-05-31 MED ORDER — HYDROCODONE-ACETAMINOPHEN 5-325 MG PO TABS
5-325 | Freq: Four times a day (QID) | ORAL | Status: DC | PRN
Start: 2022-05-31 — End: 2022-06-01
  Administered 2022-05-31 – 2022-06-01 (×2): 2 via ORAL

## 2022-05-31 MED FILL — FOLIC ACID 1 MG PO TABS: 1 MG | ORAL | Qty: 1

## 2022-05-31 MED FILL — HYDROMORPHONE HCL 1 MG/ML IJ SOLN: 1 MG/ML | INTRAMUSCULAR | Qty: 1

## 2022-05-31 MED FILL — FLUTICASONE PROPIONATE 50 MCG/ACT NA SUSP: 50 MCG/ACT | NASAL | Qty: 16

## 2022-05-31 MED FILL — XARELTO 20 MG PO TABS: 20 MG | ORAL | Qty: 1

## 2022-05-31 MED FILL — HYDROCODONE-ACETAMINOPHEN 5-325 MG PO TABS: 5-325 MG | ORAL | Qty: 2

## 2022-05-31 MED FILL — HYDREA 500 MG PO CAPS: 500 MG | ORAL | Qty: 2

## 2022-05-31 MED FILL — HYDROCODONE-ACETAMINOPHEN 5-325 MG PO TABS: 5-325 MG | ORAL | Qty: 1

## 2022-05-31 MED FILL — LEVETIRACETAM 500 MG PO TABS: 500 MG | ORAL | Qty: 3

## 2022-05-31 MED FILL — AMOXICILLIN-POT CLAVULANATE 875-125 MG PO TABS: 875-125 MG | ORAL | Qty: 1

## 2022-05-31 MED FILL — FLUOXETINE HCL 20 MG PO CAPS: 20 MG | ORAL | Qty: 2

## 2022-05-31 NOTE — Care Coordination-Inpatient (Signed)
Chart screened by CM for d/c planning.  Per most recent oncology progress note working on weaning pt off of IV pain medication to PO.  Pt is independent with ADLs at baseline.  There have been no PT/OT consults ordered.  Pt to d/c home once pain is controlled and weaned off of IV pain meds.  No d/c needs identified at the present time.  CM will continue to follow.  LOS = 4 days

## 2022-05-31 NOTE — Progress Notes (Signed)
[facility]        Inpatient Hematology / Oncology Progress Note    Reason for Admission:  Sickle cell crisis ([place]) [D57.00]    24 Hour Events:  Afeb, VSS  Hgb 7.7, retic 7.1  Pain controlled with IV dilaudid  Left breast edema/tenderness improving      ROS:  Constitutional: Positive for fatigue; negative for fever, chills, weakness, malaise.  CV: Negative for chest pain, palpitations, edema.  Respiratory: Negative for dyspnea, cough, wheezing.  GI: Negative for nausea, abdominal pain, diarrhea.    10 point review of systems is otherwise negative with the exception of the elements mentioned above in the HPI.       Allergies   Allergen Reactions    Latex Rash    Ceftriaxone Other (See Comments)    Fentanyl Other (See Comments)     "my doctor said I had a stroke from it"    Influenza Vaccines Itching     unknown      Influenza Virus Vaccine Swelling    Meperidine Other (See Comments)    Morphine Itching    Oxycodone Other (See Comments)    Sulfa Antibiotics Rash     Past Medical History:   Diagnosis Date    Arthritis     Hx of blood clots     Migraines     Seizures (HCC)     Sickle cell disease ([place])     Stroke ([place])      Past Surgical History:   Procedure Laterality Date    CESAREAN SECTION      CHOLECYSTECTOMY      LAPAROTOMY      TUBAL LIGATION      VASCULAR SURGERY       Family History   Problem Relation Age of Onset    Diabetes Mother     Hypertension Mother     Diabetes Father     Hypertension Father      Social History     Socioeconomic History    Marital status: Single     Spouse name: Not on file    Number of children: Not on file    Years of education: Not on file    Highest education level: Not on file   Occupational History    Not on file   Tobacco Use    Smoking status: Never    Smokeless tobacco: Never   Substance and Sexual Activity    Alcohol use: Not Currently    Drug use: Never    Sexual activity: Not Currently   Other Topics Concern    Not on file   Social History Narrative     Not on file     Social Determinants of Health     Financial Resource Strain: Not on file   Food Insecurity: Not on file   Transportation Needs: Not on file   Physical Activity: Not on file   Stress: Not on file   Social Connections: Not on file   Intimate Partner Violence: Not on file   Housing Stability: Not on file     Current Facility-Administered Medications   Medication Dose Route Frequency Provider Last Rate Last Admin    amoxicillin-clavulanate (AUGMENTIN) 875-125 MG per tablet 1 tablet  1 tablet Oral 2 times per day Charna Elizabeth, APRN - CNP   1 tablet at 05/31/22 0753    hydrocortisone 1 % cream   Topical BID Charna Elizabeth, APRN - CNP  Given at 05/31/22 0755    diphenhydrAMINE (BENADRYL) capsule 50 mg  50 mg Oral Q6H PRN Charna Elizabeth, APRN - CNP   50 mg at 05/30/22 1707    HYDROmorphone HCl PF (DILAUDID) injection 1 mg  1 mg IntraVENous Q3H PRN Kerby Nora, APRN - NP   1 mg at 05/31/22 0534    0.45 % sodium chloride infusion   IntraVENous Continuous Kerby Nora, APRN - NP 150 mL/hr at 05/30/22 1925 New Bag at 05/30/22 1925    HYDROcodone-acetaminophen (NORCO) 5-325 MG per tablet 1 tablet  1 tablet Oral Q6H PRN Truitt Merle, DO   1 tablet at 05/30/22 2321    sodium chloride flush 0.9 % injection 5-40 mL  5-40 mL IntraVENous 2 times per day Truitt Merle, DO   10 mL at 05/31/22 0755    sodium chloride flush 0.9 % injection 5-40 mL  5-40 mL IntraVENous PRN Truitt Merle, DO        0.9 % sodium chloride infusion   IntraVENous PRN Truitt Merle, DO        ondansetron (ZOFRAN-ODT) disintegrating tablet 4 mg  4 mg Oral Q8H PRN Truitt Merle, DO        Or    ondansetron [facility]) injection 4 mg  4 mg IntraVENous Q6H PRN Truitt Merle, DO   4 mg at 05/27/22 2009    polyethylene glycol (GLYCOLAX) packet 17 g  17 g Oral Daily PRN Truitt Merle, DO        acetaminophen (TYLENOL) tablet 650 mg  650 mg Oral Q6H PRN Truitt Merle, DO        Or    acetaminophen (TYLENOL)  suppository 650 mg  650 mg Rectal Q6H PRN Truitt Merle, DO        cyclobenzaprine (FLEXERIL) tablet 10 mg  10 mg Oral BID PRN Truitt Merle, DO        FLUoxetine (PROZAC) capsule 40 mg  40 mg Oral Daily Truitt Merle, DO   40 mg at 05/31/22 0754    fluticasone (FLONASE) 50 MCG/ACT nasal spray 2 spray  2 spray Each Nostril Daily Truitt Merle, DO   2 spray at 24/23/53 6144    folic acid (FOLVITE) tablet 1 mg  1 mg Oral Daily Truitt Merle, DO   1 mg at 05/31/22 0754    hydroxyurea (HYDREA) chemo capsule 1,000 mg  1,000 mg Oral Daily Truitt Merle, DO   1,000 mg at 05/31/22 0756    levETIRAcetam (KEPPRA) tablet 1,500 mg  1,500 mg Oral BID Truitt Merle, DO   1,500 mg at 05/31/22 3154    rivaroxaban (XARELTO) tablet 20 mg  20 mg Oral Daily with breakfast Truitt Merle, DO   20 mg at 05/31/22 0754       OBJECTIVE:  Patient Vitals for the past 8 hrs:   BP Temp Temp src Pulse Resp SpO2   05/31/22 0742 (!) 86/54 98.5 F (36.9 C) Oral 71 20 92 %   05/31/22 0237 (!) 95/54 99.1 F (37.3 C) Oral 86 16 98 %     Temp (24hrs), Avg:98.8 F (37.1 C), Min:98.4 F (36.9 C), Max:99.1 F (37.3 C)    No intake/output data recorded.    Physical Exam:  Constitutional: Well developed, well nourished female in no acute distress, sitting comfortably in the hospital bed.    HEENT: Normocephalic and atraumatic. Sclerae anicteric. Neck supple without JVD.  No thyromegaly present.    Skin Warm and dry.  No bruising and no rash noted.  No erythema.  No pallor. +prominent veins in b/l breast, lt breast tenderness with palpation-improved   Neuro Grossly nonfocal with no obvious sensory or motor deficits.   MSK Normal range of motion in general.  No edema and no tenderness.   Psych Appropriate mood and affect.    Full exam per MD    Labs:    Recent Results (from the past 24 hour(s))   Magnesium    Collection Time: 05/30/22 10:24 AM   Result Value Ref Range    Magnesium 1.6 (L) 1.8 - 2.4 mg/dL   Comprehensive Metabolic  Panel    Collection Time: 05/30/22 10:24 AM   Result Value Ref Range    Sodium 137 133 - 143 mmol/L    Potassium 3.9 3.5 - 5.1 mmol/L    Chloride 110 101 - 110 mmol/L    CO2 22 21 - 32 mmol/L    Anion Gap 5 2 - 11 mmol/L    Glucose 106 (H) 65 - 100 mg/dL    BUN 8 6 - 23 MG/DL    Creatinine 0.80 0.6 - 1.0 MG/DL    Est, Glom Filt Rate >60 >60 ml/min/1.5m    Calcium 8.3 8.3 - 10.4 MG/DL    Total Bilirubin 1.4 (H) 0.2 - 1.1 MG/DL    ALT 15 12 - 65 U/L    AST 24 15 - 37 U/L    Alk Phosphatase 91 50 - 136 U/L    Total Protein 7.1 6.3 - 8.2 g/dL    Albumin 3.3 (L) 3.5 - 5.0 g/dL    Globulin 3.8 2.8 - 4.5 g/dL    Albumin/Globulin Ratio 0.9 0.4 - 1.6     CBC with Auto Differential    Collection Time: 05/30/22 10:24 AM   Result Value Ref Range    WBC 5.1 4.3 - 11.1 K/uL    RBC 1.44 (L) 4.05 - 5.2 M/uL    Hemoglobin 6.3 (LL) 11.7 - 15.4 g/dL    Hematocrit 17.6 (L) 35.8 - 46.3 %    MCV 122.2 (H) 82 - 102 FL    MCH 43.8 (H) 26.1 - 32.9 PG    MCHC 35.8 (H) 31.4 - 35.0 g/dL    RDW 18.6 (H) 11.9 - 14.6 %    Platelets 314 150 - 450 K/uL    MPV 9.4 9.4 - 12.3 FL    nRBC 0.90 (H) 0.0 - 0.2 K/uL    Neutrophils % 60 43 - 78 %    Lymphocytes % 25 13 - 44 %    Monocytes % 13 (H) 4.0 - 12.0 %    Eosinophils % 2 0.5 - 7.8 %    Basophils % 0 0.0 - 2.0 %    Immature Granulocytes 0 0.0 - 5.0 %    Neutrophils Absolute 3.0 1.7 - 8.2 K/UL    Lymphocytes Absolute 1.3 0.5 - 4.6 K/UL    Monocytes Absolute 0.7 0.1 - 1.3 K/UL    Eosinophils Absolute 0.1 0.0 - 0.8 K/UL    Basophils Absolute 0.0 0.0 - 0.2 K/UL    Absolute Immature Granulocyte 0.0 0.0 - 0.5 K/UL    RBC Comment OCCASIONAL  POLYCHROMASIA        RBC Comment OCCASIONAL  MACROCYTOSIS        RBC Comment OCCASIONAL  ANISOCYTOSIS + POIKILOCYTOSIS        WBC Comment Result  Confirmed By Smear      Platelet Comment ADEQUATE      Differential Type AUTOMATED     Reticulocytes    Collection Time: 05/30/22 10:24 AM   Result Value Ref Range    Reticulocyte Count,Automated 7.6 (H) 0.3 - 2.0 %     Absolute Retic # 0.1096 (H) 0.026 - 0.095 M/ul    Immature Retic Fraction 20.0 (H) 3.0 - 15.9 %    Retic Hemoglobin conc. 34 29 - 35 pg   PREPARE RBC (CROSSMATCH), 1 Units    Collection Time: 05/30/22 11:45 AM   Result Value Ref Range    History Check Historical check performed    TYPE AND SCREEN    Collection Time: 05/30/22 12:52 PM   Result Value Ref Range    Crossmatch expiration date 06/02/2022,2359     ABO/Rh O POSITIVE     Antibody Screen NEG     Unit Number [id number]     Product Code Blood Bank RC LR     Unit Divison 00     Dispense Status Blood Bank TRANSFUSED     Antigen/Antibody       C NEGATIVE,  E NEGATIVE,  FY(A) NEGATIVE,  FY(B) NEGATIVE,  Jk(B) NEGATIVE,  KELL NEGATIVE,  SICKLEDEX NEGATIVE      Crossmatch Result Compatible    Magnesium    Collection Time: 05/31/22  6:03 AM   Result Value Ref Range    Magnesium 1.8 1.8 - 2.4 mg/dL   Comprehensive Metabolic Panel    Collection Time: 05/31/22  6:03 AM   Result Value Ref Range    Sodium 139 133 - 143 mmol/L    Potassium 4.0 3.5 - 5.1 mmol/L    Chloride 112 (H) 101 - 110 mmol/L    CO2 22 21 - 32 mmol/L    Anion Gap 5 2 - 11 mmol/L    Glucose 97 65 - 100 mg/dL    BUN 9 6 - 23 MG/DL    Creatinine 0.90 0.6 - 1.0 MG/DL    Est, Glom Filt Rate >60 >60 ml/min/1.23m    Calcium 8.4 8.3 - 10.4 MG/DL    Total Bilirubin 1.5 (H) 0.2 - 1.1 MG/DL    ALT 16 12 - 65 U/L    AST 25 15 - 37 U/L    Alk Phosphatase 99 50 - 136 U/L    Total Protein 7.5 6.3 - 8.2 g/dL    Albumin 3.5 3.5 - 5.0 g/dL    Globulin 4.0 2.8 - 4.5 g/dL    Albumin/Globulin Ratio 0.9 0.4 - 1.6     CBC with Auto Differential    Collection Time: 05/31/22  6:03 AM   Result Value Ref Range    WBC 5.5 4.3 - 11.1 K/uL    RBC 1.97 (L) 4.05 - 5.2 M/uL    Hemoglobin 7.7 (L) 11.7 - 15.4 g/dL    Hematocrit 21.5 (L) 35.8 - 46.3 %    MCV 109.1 (H) 82 - 102 FL    MCH 39.1 (H) 26.1 - 32.9 PG    MCHC 35.8 (H) 31.4 - 35.0 g/dL    RDW 28.6 (H) 11.9 - 14.6 %    Platelets 366 150 - 450 K/uL    MPV 9.7 9.4 - 12.3 FL     nRBC 0.86 (H) 0.0 - 0.2 K/uL    Differential Type PENDING    Reticulocytes    Collection Time: 05/31/22  6:03 AM   Result Value Ref Range  Reticulocyte Count,Automated 7.1 (H) 0.3 - 2.0 %    Absolute Retic # 0.1405 (H) 0.026 - 0.095 M/ul    Immature Retic Fraction 19.9 (H) 3.0 - 15.9 %    Retic Hemoglobin conc. 35 29 - 35 pg         Imaging:  EXAMINATION: CT ANGIOGRAM CHEST WITH IV CONTRAST   CT CHEST PULMONARY EMBOLISM W   CONTRAST     DATE OF EXAMINATION:  05/27/2022 6:30 PM     INDICATION: dyspnea, recent travel and history of PE     COMPARISON: Chest radiograph 05/27/2022 and 12/29/2021. CT soft tissue neck   05/18/2021. A report from a CT chest study dated 11/21/2020 is available but no   images are currently available.     PROCEDURE: Water-soluble iodine containing x-ray contrast was administered   intravenously during arterial phase axial chest imaging, with 2-D sagittal and   coronal and 3-D MIP reformations.  CT dose lowering techniques were used, to   include: automated exposure control, adjustment for patient size, and or use of   iterative reconstruction.     FINDINGS:       Pulmonary Arteries: A pulmonary artery filling defect is not identified. In a   pulmonary artery is at upper limits of normal in size.     Cardiovascular:   Thoracic aorta normal.  No pericardial abnormality.  Mild   four-chamber cardiomegaly. A thin catheter within the right brachiocephalic vein   is again seen, partially imaged on 06/17/2021.      Mediastinum: Lymph nodes not enlarged. Extensive collaterals are also seen   throughout the mediastinum, including several prominent collaterals along the   upper esophagus, similar to 06/17/2021.     Lungs:  Scattered areas of dependent subsegmental atelectasis are seen within   the lungs. No focal consolidation. Central airways are patent. There is a 4 mm   nodule in the left upper lobe (series 2 image 58).       Pleura:   No effusion or pneumothorax.     Chest wall:  Prominent collaterals  throughout the visualized chest wall are   again seen, previously seen on 06/17/2021 study. No focal soft tissue   abnormality. No enlarged axillary or supraclavicular lymph nodes.     Upper abdomen: There is a curvilinear hyperdense/partially calcified structure   in the left abdominal quadrant may represent an infarcted spleen. No acute   abnormality in the visualized upper abdomen.     Bones:  No acute osseous abnormality. Diffusely increased density of the   visualized osseous structures     IMPRESSION:     1.  No findings of pulmonary embolus.    2.  Scattered areas of subsegmental atelectasis within lungs. No focal   consolidation.  3.  Redemonstrated is extensive collateral vessels throughout the chest wall and   mediastinum, including several prominent vessels along the mid and upper   esophageal wall that appears similar to 06/17/2021.  4.  There is a 4 mm nodule in the left upper lobe. Comparison with prior imaging   may provide information regarding stability. Otherwise and optional follow-up   CT in 12 months is recommended to evaluate for stability per Fleischner Society   2017 guidelines for pulmonary nodules.  5.  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    TITLE: Upper extremity venous ultrasound examination.     INDICATION: Left upper extremity swelling.  History  of venous thrombus.  History  of extensive left chest wall collateral veins.     TECHNIQUE:  Grayscale, Color Flow, Spectral Analysis, Doppler Interrogation  performed.     COMPARISON: CT 05/27/2022 and earlier.  Ultrasound 08/18/2017     FINDINGS: Patent right brachiocephalic and subclavian veins.     Patent left internal jugular, brachiocephalic, subclavian, axillary, basilic,  cephalic, radial, and ulnar veins.     There is an IV in a brachial vein in the antecubital fossa with downstream  nonocclusive, thrombus.  The paired brachial veins in the arm are  patent.     The posterior tibial and peroneal veins are compressible.     IMPRESSION:  Nonocclusive thrombus in a left brachial vein associated with an IV.  Although no central vein thrombus was identified on today's ultrasound  examination, the left brachiocephalic and subclavian veins do appear to be small diameter, consistent with nonocclusive thrombus.    ASSESSMENT:  Patient Active Problem List   Diagnosis    Hypercoagulable state ([place])    Seizure disorder (HCC)    Sickle cell anemia with crisis ([place])    Sickle cell disease ([place])    CAP (community acquired pneumonia)    Acute chest wall pain    Atopic rhinitis    Gastroesophageal reflux disease    Depression    Superior vena cava occlusion (HCC)    History of DVT (deep vein thrombosis)    Menorrhagia    Abnormal uterine bleeding    Symptomatic anemia    Sickle cell pain crisis (HCC)    Acute sinusitis    Sickle cell crisis (HCC)    Migraines    History of seizure    Mastitis    Bug bite          PLAN:  Sickle cell pain crisis / HbSS  - Continue Hydrea, folic acid  - Continue aggressive IVF  - Continue pain management - IV dilaudid 45m every 3 hrs and norco 582mevery 6 hrs PRN. Benadryl PO only  - Daily retic  - On RA.   6/26 pain is well controlled, Hgb 6.8, retic 5.3, immature retic 26.9  6/27 received Dilaudid x5 and Norco x3 in past 24 hours, Hgb down to 6.3-transfuse 1 unit PRBCs  6/28 Hgb 7.7, received Dilaudid x 6 and Norco x 1 in past 24 hours, retic 7.1, encouraged use of PO vs IV pain meds, increased Norco to 10 mg Q6H     Hx of DVT  - Continue Xarelto 20 mg daily      Hx of seizures  - Continue Keppra     Concern for ? SVC Syndrome  - LUE intermittent numbness  - CT cervical spine WNL  - checking LUE doppler  - ? Consult vascular  6/26 CT Chest redemonstrated extensive collateral vessels throughout the chest wall and mediastinum, including several prominent vessels along the mid and upper esophageal wall that appears similar to 06/17/2021.  LUE doppler  with nonocclusive thrombus in a left brachial vein associated with an IV.  Although no central vein thrombus was identified on today's ultrasound examination, the left brachiocephalic and subclavian veins do appear to be small diameter, consistent with nonocclusive thrombus.    Left breast edema and tenderness  6/26 start empiric Augmentin for mastitis   6/27 D2 Augmentin-reports a little improvement  6/28 D3 Augmentin, improving     Bug bites/pruritis  6/26 continue with oral benadryl and add topical cream  6/27 improved  LUL Pulmonary Nodule  - radiology recommends 12 month follow up.       [place] home meds  [place] SOPs  VTE prophylaxis - on Xarelto     Dispo - Anticipate DC home when pain controlled. She feels that she is getting closer to being ready for DC, will focus on using PO vs IV pain meds.  FU with Dr [name] in office within 1 week of DC.              [name] Bright) Armando Gang, FNP-BC  [facility] [place] [address]  Office : [phone]  Fax : [phone]   I personally saw, exammed and counselled the patient, and discussed with NP, agree with above history/assessment/plan. 46 y.o.female sickle cell disease admitted for for pain crisis, IV Dilaudid and Norco as above, Xarelto for history of DVT, Keppra for history of seizure, complaining of left breast swelling and exam showed eminent vein marks risk concern of SVC syndrome however CT chest is negative, empirically treat for mastitis and reports improvement today, discussed transition to oral pain medicine and she reports allergy to morphine and OxyContin but not oxycodone or Norco, not completely making sense and will discuss with pharmacy, continue supportive care.       Dellia Cloud, M[place] [address]  Office : [phone]  Fax : [phone]

## 2022-05-31 NOTE — Progress Notes (Signed)
EOS:  - pt given dilaudid x1, Norco x1 for the pain  - pt resting in the bed, no voiced needs at this time  -VSS,   -bedside report giving to ongoing nurse.

## 2022-06-01 LAB — CBC WITH AUTO DIFFERENTIAL
Absolute Immature Granulocyte: 0 10*3/uL (ref 0.0–0.5)
Basophils %: 1 % (ref 0.0–2.0)
Basophils Absolute: 0 10*3/uL (ref 0.0–0.2)
Eosinophils %: 4 % (ref 0.5–7.8)
Eosinophils Absolute: 0.2 10*3/uL (ref 0.0–0.8)
Hematocrit: 22.4 % — ABNORMAL LOW (ref 35.8–46.3)
Hemoglobin: 8 g/dL — ABNORMAL LOW (ref 11.7–15.4)
Immature Granulocytes: 1 % (ref 0.0–5.0)
Lymphocytes %: 35 % (ref 13–44)
Lymphocytes Absolute: 1.6 10*3/uL (ref 0.5–4.6)
MCH: 38.8 PG — ABNORMAL HIGH (ref 26.1–32.9)
MCHC: 35.7 g/dL — ABNORMAL HIGH (ref 31.4–35.0)
MCV: 108.7 FL — ABNORMAL HIGH (ref 82–102)
MPV: 9.8 FL (ref 9.4–12.3)
Monocytes %: 10 % (ref 4.0–12.0)
Monocytes Absolute: 0.5 10*3/uL (ref 0.1–1.3)
Neutrophils %: 49 % (ref 43–78)
Neutrophils Absolute: 2.4 10*3/uL (ref 1.7–8.2)
Platelet Comment: ADEQUATE
Platelets: 338 10*3/uL (ref 150–450)
RBC: 2.06 M/uL — ABNORMAL LOW (ref 4.05–5.2)
WBC: 4.7 10*3/uL (ref 4.3–11.1)
nRBC: 1.21 10*3/uL — ABNORMAL HIGH (ref 0.0–0.2)

## 2022-06-01 LAB — COMPREHENSIVE METABOLIC PANEL
ALT: 21 U/L (ref 12–65)
AST: 63 U/L — ABNORMAL HIGH (ref 15–37)
Albumin/Globulin Ratio: 0.8 (ref 0.4–1.6)
Albumin: 3.3 g/dL — ABNORMAL LOW (ref 3.5–5.0)
Alk Phosphatase: 99 U/L (ref 50–136)
Anion Gap: 5 mmol/L (ref 2–11)
BUN: 8 MG/DL (ref 6–23)
CO2: 21 mmol/L (ref 21–32)
Calcium: 8.5 MG/DL (ref 8.3–10.4)
Chloride: 110 mmol/L (ref 101–110)
Creatinine: 0.7 MG/DL (ref 0.6–1.0)
Est, Glom Filt Rate: >60 mL/min/{1.73_m2} (ref >60)
Globulin: 4.4 g/dL (ref 2.8–4.5)
Glucose: 93 mg/dL (ref 65–100)
Potassium: 5.3 mmol/L — ABNORMAL HIGH (ref 3.5–5.1)
Sodium: 136 mmol/L (ref 133–143)
Total Bilirubin: 1.2 MG/DL — ABNORMAL HIGH (ref 0.2–1.1)
Total Protein: 7.7 g/dL (ref 6.3–8.2)

## 2022-06-01 LAB — RETICULOCYTES
Absolute Retic #: 0.193 M/ul — ABNORMAL HIGH (ref 0.026–0.095)
Immature Retic Fraction: 18.5 % — ABNORMAL HIGH (ref 3.0–15.9)
Retic Hemoglobin conc.: 31 pg (ref 29–35)
Reticulocyte Count,Automated: 7.4 % — ABNORMAL HIGH (ref 0.3–2.0)

## 2022-06-01 LAB — POTASSIUM: Potassium: 4.3 mmol/L (ref 3.5–5.1)

## 2022-06-01 LAB — MAGNESIUM: Magnesium: 1.9 mg/dL (ref 1.8–2.4)

## 2022-06-01 MED ORDER — HYDROMORPHONE HCL 2 MG PO TABS
2 MG | ORAL | Status: DC | PRN
Start: 2022-06-01 — End: 2022-06-02
  Administered 2022-06-01 – 2022-06-02 (×3): 2 mg via ORAL

## 2022-06-01 MED FILL — LEVETIRACETAM 500 MG PO TABS: 500 MG | ORAL | Qty: 3

## 2022-06-01 MED FILL — HYDROMORPHONE HCL 2 MG PO TABS: 2 MG | ORAL | Qty: 1

## 2022-06-01 MED FILL — AMOXICILLIN-POT CLAVULANATE 875-125 MG PO TABS: 875-125 MG | ORAL | Qty: 1

## 2022-06-01 MED FILL — HYDROMORPHONE HCL 1 MG/ML IJ SOLN: 1 MG/ML | INTRAMUSCULAR | Qty: 1

## 2022-06-01 MED FILL — HYDROCODONE-ACETAMINOPHEN 5-325 MG PO TABS: 5-325 MG | ORAL | Qty: 2

## 2022-06-01 MED FILL — FLUOXETINE HCL 20 MG PO CAPS: 20 MG | ORAL | Qty: 2

## 2022-06-01 MED FILL — HYDROXYUREA 500 MG PO CAPS: 500 MG | ORAL | Qty: 2

## 2022-06-01 MED FILL — FLUTICASONE PROPIONATE 50 MCG/ACT NA SUSP: 50 MCG/ACT | NASAL | Qty: 16

## 2022-06-01 MED FILL — FOLIC ACID 1 MG PO TABS: 1 MG | ORAL | Qty: 1

## 2022-06-01 MED FILL — XARELTO 20 MG PO TABS: 20 MG | ORAL | Qty: 1

## 2022-06-01 NOTE — Progress Notes (Signed)
EOS:  -pt given dilaudid IV x1, oral dilaudid x1 for the pain.  -pt resting in the bed, no voiced needs at this time.   Bedside report giving to [facility].

## 2022-06-01 NOTE — Progress Notes (Signed)
[facility]        Inpatient Hematology / Oncology Progress Note    Reason for Admission:  Sickle cell crisis ([place]) [D57.00]    24 Hour Events:  Afeb, VSS  Hgb 8.0, retic 7.4  Requiring IV pain meds   Left breast edema/tenderness resolving      ROS:  Constitutional: Positive for fatigue; negative for fever, chills, weakness, malaise.  CV: Negative for chest pain, palpitations, edema.  Respiratory: Negative for dyspnea, cough, wheezing.  GI: Negative for nausea, abdominal pain, diarrhea.    10 point review of systems is otherwise negative with the exception of the elements mentioned above in the HPI.       Allergies   Allergen Reactions    Latex Rash    Ceftriaxone Other (See Comments)    Fentanyl Other (See Comments)     "my doctor said I had a stroke from it"    Influenza Vaccines Itching     unknown      Influenza Virus Vaccine Swelling    Meperidine Other (See Comments)    Morphine Itching    Oxycodone Other (See Comments)    Sulfa Antibiotics Rash     Past Medical History:   Diagnosis Date    Arthritis     Hx of blood clots     Migraines     Seizures (HCC)     Sickle cell disease ([place])     Stroke ([place])      Past Surgical History:   Procedure Laterality Date    CESAREAN SECTION      CHOLECYSTECTOMY      LAPAROTOMY      TUBAL LIGATION      VASCULAR SURGERY       Family History   Problem Relation Age of Onset    Diabetes Mother     Hypertension Mother     Diabetes Father     Hypertension Father      Social History     Socioeconomic History    Marital status: Single     Spouse name: Not on file    Number of children: Not on file    Years of education: Not on file    Highest education level: Not on file   Occupational History    Not on file   Tobacco Use    Smoking status: Never    Smokeless tobacco: Never   Substance and Sexual Activity    Alcohol use: Not Currently    Drug use: Never    Sexual activity: Not Currently   Other Topics Concern    Not on file   Social History Narrative    Not  on file     Social Determinants of Health     Financial Resource Strain: Not on file   Food Insecurity: Not on file   Transportation Needs: Not on file   Physical Activity: Not on file   Stress: Not on file   Social Connections: Not on file   Intimate Partner Violence: Not on file   Housing Stability: Not on file     Current Facility-Administered Medications   Medication Dose Route Frequency Provider Last Rate Last Admin    HYDROcodone-acetaminophen (NORCO) 5-325 MG per tablet 2 tablet  2 tablet Oral Q6H PRN Charna Elizabeth, APRN - CNP   2 tablet at 06/01/22 0150    amoxicillin-clavulanate (AUGMENTIN) 875-125 MG per tablet 1 tablet  1 tablet Oral 2 times per day Judeth Porch  Dannielle Burn, APRN - CNP   1 tablet at 06/01/22 0842    hydrocortisone 1 % cream   Topical BID Charna Elizabeth, APRN - CNP   Given at 06/01/22 3785    diphenhydrAMINE (BENADRYL) capsule 50 mg  50 mg Oral Q6H PRN Charna Elizabeth, APRN - CNP   50 mg at 05/30/22 1707    HYDROmorphone HCl PF (DILAUDID) injection 1 mg  1 mg IntraVENous Q3H PRN Kerby Nora, APRN - NP   1 mg at 06/01/22 0841    0.45 % sodium chloride infusion   IntraVENous Continuous Kerby Nora, APRN - NP 150 mL/hr at 06/01/22 0847 New Bag at 06/01/22 0847    sodium chloride flush 0.9 % injection 5-40 mL  5-40 mL IntraVENous 2 times per day Truitt Merle, DO   10 mL at 06/01/22 0843    sodium chloride flush 0.9 % injection 5-40 mL  5-40 mL IntraVENous PRN Truitt Merle, DO        0.9 % sodium chloride infusion   IntraVENous PRN Truitt Merle, DO        ondansetron (ZOFRAN-ODT) disintegrating tablet 4 mg  4 mg Oral Q8H PRN Truitt Merle, DO        Or    ondansetron [facility]) injection 4 mg  4 mg IntraVENous Q6H PRN Truitt Merle, DO   4 mg at 05/27/22 2009    polyethylene glycol (GLYCOLAX) packet 17 g  17 g Oral Daily PRN Truitt Merle, DO        acetaminophen (TYLENOL) tablet 650 mg  650 mg Oral Q6H PRN Truitt Merle, DO        Or    acetaminophen (TYLENOL)  suppository 650 mg  650 mg Rectal Q6H PRN Truitt Merle, DO        cyclobenzaprine (FLEXERIL) tablet 10 mg  10 mg Oral BID PRN Truitt Merle, DO        FLUoxetine (PROZAC) capsule 40 mg  40 mg Oral Daily Truitt Merle, DO   40 mg at 06/01/22 0841    fluticasone (FLONASE) 50 MCG/ACT nasal spray 2 spray  2 spray Each Nostril Daily Truitt Merle, DO   2 spray at 88/50/27 7412    folic acid (FOLVITE) tablet 1 mg  1 mg Oral Daily Truitt Merle, DO   1 mg at 06/01/22 8786    hydroxyurea (HYDREA) chemo capsule 1,000 mg  1,000 mg Oral Daily Truitt Merle, DO   1,000 mg at 06/01/22 0843    levETIRAcetam (KEPPRA) tablet 1,500 mg  1,500 mg Oral BID Truitt Merle, DO   1,500 mg at 06/01/22 7672    rivaroxaban (XARELTO) tablet 20 mg  20 mg Oral Daily with breakfast Truitt Merle, DO   20 mg at 06/01/22 0947       OBJECTIVE:  Patient Vitals for the past 8 hrs:   BP Temp Temp src Pulse Resp SpO2   06/01/22 0739 113/75 97.9 F (36.6 C) Oral 74 18 100 %   06/01/22 0312 103/71 97.9 F (36.6 C) Oral 82 18 99 %     Temp (24hrs), Avg:97.8 F (36.6 C), Min:97.3 F (36.3 C), Max:98.1 F (36.7 C)    06/29 0701 - 06/29 1900  In: 360 [P.O.:360]  Out: 700 [Urine:700]    Physical Exam:  Constitutional: Well developed, well nourished female in no acute distress, sitting comfortably in the hospital bed.  HEENT: Normocephalic and atraumatic. Sclerae anicteric. Neck supple without JVD. No thyromegaly present.    Skin Warm and dry.  No bruising and no rash noted.  No erythema.  No pallor. +prominent veins in b/l breast, lt breast tenderness with palpation-improved   Neuro Grossly nonfocal with no obvious sensory or motor deficits.   MSK Normal range of motion in general.  No edema and no tenderness.   Psych Appropriate mood and affect.    Full exam per MD    Labs:    Recent Results (from the past 24 hour(s))   Magnesium    Collection Time: 06/01/22  7:38 AM   Result Value Ref Range    Magnesium 1.9 1.8 - 2.4 mg/dL    Comprehensive Metabolic Panel    Collection Time: 06/01/22  7:38 AM   Result Value Ref Range    Sodium 136 133 - 143 mmol/L    Potassium 5.3 (H) 3.5 - 5.1 mmol/L    Chloride 110 101 - 110 mmol/L    CO2 21 21 - 32 mmol/L    Anion Gap 5 2 - 11 mmol/L    Glucose 93 65 - 100 mg/dL    BUN 8 6 - 23 MG/DL    Creatinine 0.70 0.6 - 1.0 MG/DL    Est, Glom Filt Rate >60 >60 ml/min/1.66m    Calcium 8.5 8.3 - 10.4 MG/DL    Total Bilirubin 1.2 (H) 0.2 - 1.1 MG/DL    ALT 21 12 - 65 U/L    AST 63 (H) 15 - 37 U/L    Alk Phosphatase 99 50 - 136 U/L    Total Protein 7.7 6.3 - 8.2 g/dL    Albumin 3.3 (L) 3.5 - 5.0 g/dL    Globulin 4.4 2.8 - 4.5 g/dL    Albumin/Globulin Ratio 0.8 0.4 - 1.6     CBC with Auto Differential    Collection Time: 06/01/22  7:38 AM   Result Value Ref Range    WBC 4.7 4.3 - 11.1 K/uL    RBC 2.06 (L) 4.05 - 5.2 M/uL    Hemoglobin 8.0 (L) 11.7 - 15.4 g/dL    Hematocrit 22.4 (L) 35.8 - 46.3 %    MCV 108.7 (H) 82 - 102 FL    MCH 38.8 (H) 26.1 - 32.9 PG    MCHC 35.7 (H) 31.4 - 35.0 g/dL    Platelets 338 150 - 450 K/uL    MPV 9.8 9.4 - 12.3 FL    nRBC 1.21 (H) 0.0 - 0.2 K/uL    Differential Type PENDING    Reticulocytes    Collection Time: 06/01/22  7:38 AM   Result Value Ref Range    Reticulocyte Count,Automated 7.4 (H) 0.3 - 2.0 %    Absolute Retic # 0.1930 (H) 0.026 - 0.095 M/ul    Immature Retic Fraction 18.5 (H) 3.0 - 15.9 %    Retic Hemoglobin conc. 31 29 - 35 pg         Imaging:  EXAMINATION: CT ANGIOGRAM CHEST WITH IV CONTRAST   CT CHEST PULMONARY EMBOLISM W   CONTRAST     DATE OF EXAMINATION:  05/27/2022 6:30 PM     INDICATION: dyspnea, recent travel and history of PE     COMPARISON: Chest radiograph 05/27/2022 and 12/29/2021. CT soft tissue neck   05/18/2021. A report from a CT chest study dated 11/21/2020 is available but no   images are currently available.     PROCEDURE: Water-soluble  iodine containing x-ray contrast was administered   intravenously during arterial phase axial chest imaging, with 2-D  sagittal and   coronal and 3-D MIP reformations.  CT dose lowering techniques were used, to   include: automated exposure control, adjustment for patient size, and or use of   iterative reconstruction.     FINDINGS:       Pulmonary Arteries: A pulmonary artery filling defect is not identified. In a   pulmonary artery is at upper limits of normal in size.     Cardiovascular:   Thoracic aorta normal.  No pericardial abnormality.  Mild   four-chamber cardiomegaly. A thin catheter within the right brachiocephalic vein   is again seen, partially imaged on 06/17/2021.      Mediastinum: Lymph nodes not enlarged. Extensive collaterals are also seen   throughout the mediastinum, including several prominent collaterals along the   upper esophagus, similar to 06/17/2021.     Lungs:  Scattered areas of dependent subsegmental atelectasis are seen within   the lungs. No focal consolidation. Central airways are patent. There is a 4 mm   nodule in the left upper lobe (series 2 image 58).       Pleura:   No effusion or pneumothorax.     Chest wall:  Prominent collaterals throughout the visualized chest wall are   again seen, previously seen on 06/17/2021 study. No focal soft tissue   abnormality. No enlarged axillary or supraclavicular lymph nodes.     Upper abdomen: There is a curvilinear hyperdense/partially calcified structure   in the left abdominal quadrant may represent an infarcted spleen. No acute   abnormality in the visualized upper abdomen.     Bones:  No acute osseous abnormality. Diffusely increased density of the   visualized osseous structures     IMPRESSION:     1.  No findings of pulmonary embolus.    2.  Scattered areas of subsegmental atelectasis within lungs. No focal   consolidation.  3.  Redemonstrated is extensive collateral vessels throughout the chest wall and   mediastinum, including several prominent vessels along the mid and upper   esophageal wall that appears similar to 06/17/2021.  4.  There is a 4 mm  nodule in the left upper lobe. Comparison with prior imaging   may provide information regarding stability. Otherwise and optional follow-up   CT in 12 months is recommended to evaluate for stability per Fleischner Society   2017 guidelines for pulmonary nodules.  5.  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    TITLE: Upper extremity venous ultrasound examination.     INDICATION: Left upper extremity swelling.  History of venous thrombus.  History  of extensive left chest wall collateral veins.     TECHNIQUE:  Grayscale, Color Flow, Spectral Analysis, Doppler Interrogation  performed.     COMPARISON: CT 05/27/2022 and earlier.  Ultrasound 08/18/2017     FINDINGS: Patent right brachiocephalic and subclavian veins.     Patent left internal jugular, brachiocephalic, subclavian, axillary, basilic,  cephalic, radial, and ulnar veins.     There is an IV in a brachial vein in the antecubital fossa with downstream  nonocclusive, thrombus.  The paired brachial veins in the arm are patent.     The posterior tibial and peroneal veins are compressible.     IMPRESSION:  Nonocclusive thrombus in a left brachial vein associated with an IV.  Although no central vein  thrombus was identified on today's ultrasound  examination, the left brachiocephalic and subclavian veins do appear to be small diameter, consistent with nonocclusive thrombus.    ASSESSMENT:  Patient Active Problem List   Diagnosis    Hypercoagulable state ([place])    Seizure disorder (HCC)    Sickle cell anemia with crisis ([place])    Sickle cell disease ([place])    CAP (community acquired pneumonia)    Acute chest wall pain    Atopic rhinitis    Gastroesophageal reflux disease    Depression    Superior vena cava occlusion (HCC)    History of DVT (deep vein thrombosis)    Menorrhagia    Abnormal uterine bleeding    Symptomatic anemia    Sickle cell pain crisis (HCC)    Acute sinusitis    Sickle cell  crisis (HCC)    Migraines    History of seizure    Mastitis    Bug bite          PLAN:  Sickle cell pain crisis / HbSS  - Continue Hydrea, folic acid  - Continue aggressive IVF  - Continue pain management - IV dilaudid 43m every 3 hrs and norco 555mevery 6 hrs PRN. Benadryl PO only  - Daily retic  - On RA.   6/26 pain is well controlled, Hgb 6.8, retic 5.3, immature retic 26.9  6/27 received Dilaudid x5 and Norco x3 in past 24 hours, Hgb down to 6.3-transfuse 1 unit PRBCs  6/28 Hgb 7.7, received Dilaudid x 6 and Norco x 1 in past 24 hours, retic 7.1, encouraged use of PO vs IV pain meds, increased Norco to 10 mg Q6H  6/29 Still requiring IV dilaudid. Utilized Norco 1049m2. Hgb and retic improving. Asking PC to see as patient has multiple allergies to opioid and considering starting long-acting pain meds.     Hx of DVT  - Continue Xarelto 20 mg daily      Hx of seizures  - Continue Keppra     Concern for ? SVC Syndrome  - LUE intermittent numbness  - CT cervical spine WNL  - checking LUE doppler  - ? Consult vascular  6/26 CT Chest redemonstrated extensive collateral vessels throughout the chest wall and mediastinum, including several prominent vessels along the mid and upper esophageal wall that appears similar to 06/17/2021.  LUE doppler with nonocclusive thrombus in a left brachial vein associated with an IV.  Although no central vein thrombus was identified on today's ultrasound examination, the left brachiocephalic and subclavian veins do appear to be small diameter, consistent with nonocclusive thrombus.    Left breast edema and tenderness  6/26 start empiric Augmentin for mastitis   6/27 D2 Augmentin-reports a little improvement  6/29 D4 (of 7 day course) of Augmentin, improving     Bug bites/pruritis  6/26 continue with oral benadryl and add topical cream  6/27 improved     LUL Pulmonary Nodule  - radiology recommends 12 month follow up.       [place] home meds  [place] SOPs  VTE prophylaxis - on Xarelto      Dispo - Anticipate DC home when pain controlled. She feels that she is getting closer to being ready for DC, will focus on using PO vs IV pain meds.  FU with Dr KhaHumphrey Rolls office within 1 week of DC.                   KatDonne Hazel  APRN - [facility]  [address] [place] [address]  Office : [phone]  Fax : [phone]   I personally saw, exammed and counselled the patient, and discussed with NP, agree with above history/assessment/plan. 46 y.o.female sickle cell disease admitted for for pain crisis, IV Dilaudid and Norco as above, Xarelto for history of DVT, Keppra for history of seizure, complaining of left breast swelling and exam showed eminent vein marks risk concern of SVC syndrome however CT chest is negative, empirically treat for mastitis and reports improvement today, discussed transition to oral pain medicine and she reports allergy to morphine and OxyContin but not oxycodone or Norco, also reports fentanyl patch caused her stroke, not completely making sense and will discuss with pharmacy, increase Norco, consult palliative care, continue supportive care.       Dellia Cloud, [place] [address]  Office : [phone]  Fax : [phone]

## 2022-06-01 NOTE — Consults (Signed)
Patient: [name] Bright MRN: [id number]  SSN: [id number]    Date of Birth: [birth year]  Age: 46 y.o.  Sex: female       Date of Request: 06/01/2022  Date of Consult:  06/01/2022  Reason for Consult:  pain and symptom management  Requesting Physician: Dr. Ronny Flurry     Assessment/Plan:     Principal Diagnosis:    Pain, general  R52    Additional Diagnoses:   Counseling, Encounter for Medical Advice  Z71.9  Encounter for Palliative Care  Z51.5    Palliative Performance Scale (PPS):       Medical Decision Making:   Reviewed and summarized labs and imaging from admission.  Met with pt at bedside.  She still has pain but feels it is improving.  She states she has stopped taking pain meds at home years ago but at one time she was on methadone and dilaudid.  We discussed resuming some as needed medications for pain.  Pt has tolerated dilaudid po in the past.  Will stop norco and start dilaudid 2 mg po q 4 hr prn moderate pain.  Continue iv dilaudid option while inpatient but utilize oral before iv.  I do not see a reason to start long acting meds as this time.  Will follow.      Will discuss findings with members of the interdisciplinary team.      Thank you for this referral.         Subjective:     History obtained from:  Patient and Chart    Chief Complaint: general pain  History of Present Illness:  46 y.o. female with medical history of  seizures, DVT, PE, splenic infarct, SVC thrombosis. who presented with report of diffuse back pain, pain in top of leg, bilateral hips and bilateral shoulders and intermittent numbness of LUE w/o weakness. Says this is not atypical for her pain crisis. States symptoms present since April but progressive in last one week. Has not been taking any pain meds since she took a trip to [place] in April.   Denies nausea,vomiting at home. No fevers, chills, sick contacts. Urine and bowel habtis normal. No abdominal pain.     Advance Directive: No       Code Status:  Full Code            Health Care  Power of Attorney: No - Patient does not have a [place].    Past Medical History:   Diagnosis Date    Arthritis     Hx of blood clots     Migraines     Seizures (HCC)     Sickle cell disease (HCC)     Stroke [facility])       Past Surgical History:   Procedure Laterality Date    CESAREAN SECTION      CHOLECYSTECTOMY      LAPAROTOMY      TUBAL LIGATION      VASCULAR SURGERY       Family History   Problem Relation Age of Onset    Diabetes Mother     Hypertension Mother     Diabetes Father     Hypertension Father       Social History     Tobacco Use    Smoking status: Never    Smokeless tobacco: Never   Substance Use Topics    Alcohol use: Not Currently     Prior to Admission medications    Medication  Sig Start Date End Date Taking? Authorizing Provider   diphenhydrAMINE (SOMINEX) 25 MG tablet Take 1 tablet by mouth 04/02/16  Yes Historical Provider, MD   fexofenadine (ALLEGRA) 180 MG tablet Take 1 tablet by mouth daily 02/20/22  Yes Historical Provider, MD   FLUoxetine (PROZAC) 40 MG capsule  05/08/22   Historical Provider, MD   lacosamide (VIMPAT) 100 MG TABS tablet Take 1 tablet by mouth 2 times daily. Max Daily Amount: 200 mg  Patient not taking: Reported on 05/27/2022    Historical Provider, MD   ondansetron (ZOFRAN ODT) 8 MG TBDP disintegrating tablet Place 0.5-1 tablets under the tongue every 6-8 hours as needed for Nausea or Vomiting 10/29/21   Ky Barban, MD   cyclobenzaprine (FLEXERIL) 10 MG tablet  07/07/21   Historical Provider, MD   hydroxyurea (HYDREA) 500 MG chemo capsule Take 2 capsules by mouth in the morning. 07/14/21   Loney Loh, MD   fluticasone (FLONASE) 50 MCG/ACT nasal spray 2 sprays by Each Nostril route in the morning. 06/20/21   Thu Pham, DO   levETIRAcetam (KEPPRA) 750 MG tablet Take 2 tablets by mouth in the morning and 2 tablets before bedtime. 06/19/21   Thu Pham, DO   cyanocobalamin 100 MCG tablet Take 100 mcg by mouth daily  Patient not taking: Reported on 07/14/2021    Ar  Automatic Reconciliation   diphenhydrAMINE-APAP, sleep, (TYLENOL PM EXTRA STRENGTH) 25-500 MG tablet Take 1 tablet by mouth daily    Ar Automatic Reconciliation   FLUoxetine (PROZAC) 20 MG capsule Take 2 capsules by mouth daily    Ar Automatic Reconciliation   folic acid (FOLVITE) 1 MG tablet Take 1 tablet by mouth daily    Ar Automatic Reconciliation   rivaroxaban (XARELTO) 20 MG TABS tablet Take 1 tablet by mouth daily (with breakfast)    Ar Automatic Reconciliation   naproxen (EC NAPROSYN) 500 MG EC tablet Take 500 mg by mouth 2 times daily (with meals)  Patient not taking: Reported on 06/14/2021 03/18/21 06/19/21  Ar Automatic Reconciliation   pantoprazole (PROTONIX) 40 MG tablet Take 40 mg by mouth daily  Patient not taking: Reported on 06/14/2021 08/21/17 06/19/21  Ar Automatic Reconciliation       Allergies   Allergen Reactions    Latex Rash    Ceftriaxone Other (See Comments)    Fentanyl Other (See Comments)     "my doctor said I had a stroke from it"    Influenza Vaccines Itching     unknown      Influenza Virus Vaccine Swelling    Meperidine Other (See Comments)    Morphine Itching    Oxycodone Other (See Comments)    Sulfa Antibiotics Rash        Comprehensive review of systems completed and negative with exception of noted above     Objective:     Visit Vitals  BP 107/68   Pulse 74   Temp 98 F (36.7 C) (Oral)   Resp 16   Ht '5\' 8"'  (1.727 m)   Wt 168 lb (76.2 kg)   SpO2 99%   BMI 25.54 kg/m        Physical Exam:    General:  Cooperative. No acute distress.   Eyes:  Conjunctivae/corneas clear    Nose: Nares normal. Septum midline.   Neck: Supple, symmetrical, trachea midline, no JVD   Lungs:   Clear to auscultation bilaterally, unlabored   Heart:  Regular rate and rhythm, no murmur  Abdomen:   Soft, non-tender, non-distended. Positive bowel sounds   Extremities: Normal, atraumatic, no cyanosis or edema   Skin: Skin color, texture, turgor normal. No rash or lesions.   Neurologic: Nonfocal   Psych: Alert and  oriented       Signed By: Sherlynn Stalls, MD     June 01, 2022

## 2022-06-01 NOTE — Progress Notes (Signed)
TRANSFER - IN REPORT:    Verbal report received from Verdie Mosher, RN on [name] Bright     Report consisted of patient's Situation, Background, Assessment and   Recommendations(SBAR).     Information from the following report(s) Nurse Handoff Report, MAR, and Recent Results was reviewed with the receiving nurse.    Opportunity for questions and clarification was provided.      Care resumed by [facility], RN

## 2022-06-02 LAB — COMPREHENSIVE METABOLIC PANEL
ALT: 19 U/L (ref 12–65)
AST: 29 U/L (ref 15–37)
Albumin/Globulin Ratio: 0.7 (ref 0.4–1.6)
Albumin: 3.1 g/dL — ABNORMAL LOW (ref 3.5–5.0)
Alk Phosphatase: 89 U/L (ref 50–136)
Anion Gap: 5 mmol/L (ref 2–11)
BUN: 6 MG/DL (ref 6–23)
CO2: 22 mmol/L (ref 21–32)
Calcium: 8.9 MG/DL (ref 8.3–10.4)
Chloride: 110 mmol/L (ref 101–110)
Creatinine: 0.7 MG/DL (ref 0.6–1.0)
Est, Glom Filt Rate: >60 mL/min/{1.73_m2} (ref >60)
Globulin: 4.2 g/dL (ref 2.8–4.5)
Glucose: 85 mg/dL (ref 65–100)
Potassium: 3.8 mmol/L (ref 3.5–5.1)
Sodium: 137 mmol/L (ref 133–143)
Total Bilirubin: 1.2 MG/DL — ABNORMAL HIGH (ref 0.2–1.1)
Total Protein: 7.3 g/dL (ref 6.3–8.2)

## 2022-06-02 LAB — CBC WITH AUTO DIFFERENTIAL
Basophils %: 0 % (ref 0.0–2.0)
Eosinophils %: 4 % (ref 0.5–7.8)
Hematocrit: 20.9 % — ABNORMAL LOW (ref 35.8–46.3)
Hemoglobin: 7.4 g/dL — ABNORMAL LOW (ref 11.7–15.4)
Immature Granulocytes: 1 % (ref 0.0–5.0)
Lymphocytes %: 29 % (ref 13–44)
MCH: 39.6 PG — ABNORMAL HIGH (ref 26.1–32.9)
MCHC: 35.4 g/dL — ABNORMAL HIGH (ref 31.4–35.0)
MCV: 111.8 FL — ABNORMAL HIGH (ref 82–102)
MPV: 9.7 FL (ref 9.4–12.3)
Monocytes %: 13 % — ABNORMAL HIGH (ref 4.0–12.0)
Neutrophils %: 53 % (ref 43–78)
Platelet Comment: ADEQUATE
Platelets: 314 10*3/uL (ref 150–450)
RBC: 1.87 M/uL — ABNORMAL LOW (ref 4.05–5.2)
WBC: 3.7 10*3/uL — ABNORMAL LOW (ref 4.3–11.1)
nRBC: 1.88 10*3/uL — ABNORMAL HIGH (ref 0.0–0.2)

## 2022-06-02 LAB — RETICULOCYTES
Absolute Retic #: 0.277 M/ul — ABNORMAL HIGH (ref 0.026–0.095)
Immature Retic Fraction: 23.7 % — ABNORMAL HIGH (ref 3.0–15.9)
Retic Hemoglobin conc.: 39 pg — ABNORMAL HIGH (ref 29–35)
Reticulocyte Count,Automated: 7.8 % — ABNORMAL HIGH (ref 0.3–2.0)

## 2022-06-02 LAB — MAGNESIUM: Magnesium: 1.6 mg/dL — ABNORMAL LOW (ref 1.8–2.4)

## 2022-06-02 MED ORDER — NALOXONE HCL 4 MG/0.1ML NA LIQD
4 | NASAL | 0 refills | Status: AC | PRN
Start: 2022-06-02 — End: ?
  Filled 2022-06-02: qty 2, 1d supply, fill #0

## 2022-06-02 MED ORDER — AMOXICILLIN-POT CLAVULANATE 875-125 MG PO TABS
875-125 MG | ORAL_TABLET | Freq: Two times a day (BID) | ORAL | 0 refills | Status: AC
Start: 2022-06-02 — End: 2022-06-05
  Filled 2022-06-02: qty 5, 2d supply, fill #0

## 2022-06-02 MED ORDER — MAGNESIUM OXIDE 400 (240 MG) MG PO TABS
400 (240 Mg) MG | Freq: Two times a day (BID) | ORAL | Status: DC
Start: 2022-06-02 — End: 2022-06-02
  Administered 2022-06-02: 12:00:00 400 mg via ORAL

## 2022-06-02 MED ORDER — HYDROMORPHONE HCL 4 MG PO TABS
4 MG | ORAL | Status: DC | PRN
Start: 2022-06-02 — End: 2022-06-02
  Administered 2022-06-02: 13:00:00 4 mg via ORAL

## 2022-06-02 MED ORDER — HYDROMORPHONE HCL 4 MG PO TABS
4 MG | ORAL_TABLET | ORAL | 0 refills | Status: AC | PRN
Start: 2022-06-02 — End: 2022-06-05
  Filled 2022-06-02: qty 18, 3d supply, fill #0

## 2022-06-02 MED FILL — FOLIC ACID 1 MG PO TABS: 1 MG | ORAL | Qty: 1

## 2022-06-02 MED FILL — XARELTO 20 MG PO TABS: 20 MG | ORAL | Qty: 1

## 2022-06-02 MED FILL — HYDROXYUREA 500 MG PO CAPS: 500 MG | ORAL | Qty: 2

## 2022-06-02 MED FILL — MAGNESIUM OXIDE 400 (240 MG) MG PO TABS: 400 (240 Mg) MG | ORAL | Qty: 1

## 2022-06-02 MED FILL — FLUOXETINE HCL 20 MG PO CAPS: 20 MG | ORAL | Qty: 2

## 2022-06-02 MED FILL — LEVETIRACETAM 500 MG PO TABS: 500 MG | ORAL | Qty: 3

## 2022-06-02 MED FILL — HYDROMORPHONE HCL 2 MG PO TABS: 2 MG | ORAL | Qty: 1

## 2022-06-02 MED FILL — HYDROMORPHONE HCL 4 MG PO TABS: 4 MG | ORAL | Qty: 1

## 2022-06-02 MED FILL — AMOXICILLIN-POT CLAVULANATE 875-125 MG PO TABS: 875-125 MG | ORAL | Qty: 1

## 2022-06-02 NOTE — Discharge Summary (Signed)
[facility] Hematology & Oncology: Inpatient Hematology / Oncology Discharge Summary Note    Patient ID:  [name] Bright  [id number]  46 y.o.  05/31/1976    Admit Date: 05/27/2022    Discharge Date: 06/02/2022    Admission Diagnoses: Sickle cell crisis [facility]) [D57.00]    Discharge Diagnoses:  Principal Diagnosis: Sickle cell crisis (HCC)  Principal Problem:    Sickle cell crisis (HCC)  Active Problems:    Migraines    History of seizure    Mastitis    Bug bite  Resolved Problems:    * No resolved hospital problems. [facility] Course:    [name] Bright is a 46 y.o. female admitted on 05/27/2022. The primary encounter diagnosis was Sickle cell crisis (HCC). A diagnosis of History of DVT (deep vein thrombosis) was also pertinent to this visit..       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 She is on Hydrea and folic acid.  She is on Xarelto for hx of DVT.  Last hospitalization here in February 2023.  Last seen by Dr. Welton Flakes for OV 07/2021.  She presented to the ER on day of admission with diffuse back pain, leg/hip/shoulder pain and intermittent numbness of LUE w/o weakness.  These symptoms have been typical of prior pain crisis.  She has not taken any pain meds since April when she took a trip to [place].  No other current symptoms.  CT cervical spine checked d/t LUE weakness and negative.  CT chest negative for PE but did show 4 mm LUL nodule, radiology recommended 12 month follow up for this.  Checking LUE doppler d/t LUE intermittent numbness and concern for possible SVC syndrome.  We were asked for our recommendations for our known patient.    While admitted, she completed 5 course of Augmentin for possible mastitis. LUE doppler without occlusive thrombus. Pain managed on PO dilaudid. She is now ready for DC. Will continue dilaudid PO PRN as OP as well as 2 days Augmentin to total 7 day course. Will arrange for FU with Dr Welton Flakes in 1 week (appt  requested). She knows to call with questions or concerns.     Sickle cell pain crisis / HbSS  - Continue Hydrea, folic acid  - Continue aggressive IVF  - Continue pain management - IV dilaudid 1mg  every 3 hrs and norco 5mg  every 6 hrs PRN. Benadryl PO only  - Daily retic  - On RA.   6/26 pain is well controlled, Hgb 6.8, retic 5.3, immature retic 26.9  6/27 received Dilaudid x5 and Norco x3 in past 24 hours, Hgb down to 6.3-transfuse 1 unit PRBCs  6/28 Hgb 7.7, received Dilaudid x 6 and Norco x 1 in past 24 hours, retic 7.1, encouraged use of PO vs IV pain meds, increased Norco to 10 mg Q6H  6/29 Still requiring IV dilaudid. Utilized Norco 10mg  x2. Hgb and retic improving. Asking PC to see as patient has multiple allergies to opioid and considering starting long-acting pain meds.  6/30 Per PC no need for long-acting pain meds; dilaudid PO added and pain better. Using less IV pain meds. PC recommends DC home on current dose (increased today).     Hx of DVT  - Continue Xarelto 20 mg daily      Hx of seizures  - Continue Keppra     Concern for ? SVC  Syndrome  - LUE intermittent numbness  - CT cervical spine WNL  - checking LUE doppler  - ? Consult vascular  6/26 CT Chest redemonstrated extensive collateral vessels throughout the chest wall and mediastinum, including several prominent vessels along the mid and upper esophageal wall that appears similar to 06/17/2021.  LUE doppler with nonocclusive thrombus in a left brachial vein associated with an IV.  Although no central vein thrombus was identified on today's ultrasound examination, the left brachiocephalic and subclavian veins do appear to be small diameter, consistent with nonocclusive thrombus.     Left breast edema and tenderness  6/26 start empiric Augmentin for mastitis   6/27 D2 Augmentin-reports a little improvement  6/30 D5 (of 7 day course) of Augmentin, improving      Bug bites/pruritis  6/26 continue with oral benadryl and add topical cream  6/27  improved     LUL Pulmonary Nodule  - radiology recommends 12 month follow up.      Consults:  IP CONSULT TO ONCOLOGY  IP CONSULT TO PALLIATIVE CARE    Pertinent Diagnostic Studies:   Labs:    Recent Labs     05/31/22  0603 06/01/22  0738 06/02/22  0604   WBC 5.5 4.7 3.7*   HGB 7.7* 8.0* 7.4*   PLT 366 338 314      Recent Labs     05/31/22  0603 06/01/22  0738 06/01/22  0947 06/02/22  0604   NA 139 136  --  137   K 4.0 5.3* 4.3 3.8   CL 112* 110  --  110   CO2 22 21  --  22   BUN 9 8  --  6   MG 1.8 1.9  --  1.6*       Imaging:    CT Result (most recent):  CT CERVICAL SPINE WO CONTRAST 05/27/2022    Narrative  EXAMINATION: CT CERVICAL SPINE WITHOUT    DATE: 05/27/2022 6:30 PM    INDICATION: Left arm intermittent numbness    COMPARISON: None available at the time of this dictations.    TECHNIQUE: Thin section axial noncontrast images were obtained through the  cervical spine.  Sagittal and coronal reformatted images were created.  Images  were reviewed in bone and soft tissue windows.  CT dose lowering techniques were  used, to include: automated exposure control, adjustment for patient size, and  or use of iterative reconstruction.    FINDINGS:    Osseous:    There is straightening of the normal cervical lordosis which may be positional  or related to muscle strain/spasm.    Vertebral body height and alignment is preserved.    No acute fracture or subluxation.    No prevertebral soft tissue swelling. The lateral masses of C1 align on C2.      Degenerative Changes    No significant degenerative changes.    Soft Tissues of the Neck:      No focal soft tissue abnormality within the visualized neck.    Visualized lung apices are clear. Visualized airways are patent.    Impression  No acute fracture or subluxation of the cervical spine.              Rudolpho Sevin, M.D.  05/27/2022 7:27:00 PM      Ultrasound Result (most recent):  [place] HEAD NECK SOFT TISSUE THYROID 01/14/2022    Narrative  THYROID SONOGRAPHY    CLINICAL  HISTORY:  Right neck swelling.  COMPARISON: NECK CT of June 17, 2021.Marland Kitchen    FINDINGS:  Multiple images from real-time ultrasound evaluation of the thyroid  show that the right lobe measures 4.3 x 1.2 x 1.6 cm while the left measures 3.9  x 1.3 x 1.0 cm.  There is a 1.0 cm isoechoic solid nodule in the right mid pole  constituting a TR3 lesion.  Incidental tiny left lower pole cyst is also noted.  No focal left lobe abnormality is evident.  The isthmus appears normal at 4 mm.    Impression  NORMAL-SIZED THYROID WITH A SINGLE, MILDLY SUSPICIOUS (TR 3) NODULE IN THE RIGHT  LOBE FOR WHICH FOLLOW-UP THYROID ULTRASOUND IS RECOMMENDED IN ONE YEAR, UNLESS  FURTHER EVALUATION IS CLINICALLY INDICATED SOONER.      DC3  The significant findings in this report have been referred to the Imaging  Navigator in order to facilitate follow-up evaluation.         Medication List        START taking these medications      amoxicillin-clavulanate 875-125 MG per tablet  Commonly known as: AUGMENTIN  Take 1 tablet by mouth every 12 hours for 5 doses     HYDROmorphone 4 MG tablet  Commonly known as: DILAUDID  Take 1 tablet by mouth every 4 hours as needed for Pain for up to 3 days. Max Daily Amount: 24 mg     naloxone 4 MG/0.1ML Liqd nasal spray  1 spray by Nasal route as needed for Opioid Reversal            CHANGE how you take these medications      FLUoxetine 20 MG capsule  Commonly known as: PROZAC  What changed: Another medication with the same name was removed. Continue taking this medication, and follow the directions you see here.            CONTINUE taking these medications      diphenhydrAMINE 25 MG tablet  Commonly known as: SOMINEX     diphenhydrAMINE-APAP (sleep) 25-500 MG tablet  Commonly known as: TYLENOL PM EXTRA STRENGTH     fexofenadine 180 MG tablet  Commonly known as: ALLEGRA     fluticasone 50 MCG/ACT nasal spray  Commonly known as: FLONASE  2 sprays by Each Nostril route in the morning.     folic acid 1 MG  tablet  Commonly known as: FOLVITE     hydroxyurea 500 MG chemo capsule  Commonly known as: HYDREA  Take 2 capsules by mouth in the morning.     levETIRAcetam 750 MG tablet  Commonly known as: KEPPRA  Take 2 tablets by mouth in the morning and 2 tablets before bedtime.     ondansetron 8 MG Tbdp disintegrating tablet  Commonly known as: Zofran ODT  Place 0.5-1 tablets under the tongue every 6-8 hours as needed for Nausea or Vomiting     rivaroxaban 20 MG Tabs tablet  Commonly known as: XARELTO            STOP taking these medications      cyanocobalamin 100 MCG tablet     cyclobenzaprine 10 MG tablet  Commonly known as: FLEXERIL     lacosamide 100 MG Tabs tablet  Commonly known as: VIMPAT     naproxen 500 MG EC tablet  Commonly known as: EC NAPROSYN     pantoprazole 40 MG tablet  Commonly known as: PROTONIX  Where to Get Your Medications        These medications were sent to [facility] - [place], [place] - [address] - P [phone] Carmon Ginsberg [phone]  [address], [place] [address]      Phone: [phone]   amoxicillin-clavulanate 875-125 MG per tablet  HYDROmorphone 4 MG tablet  naloxone 4 MG/0.1ML Liqd nasal spray       I have reviewed the patient's controlled substance prescription history, as maintained in the [place] prescription monitoring program, so that the prescriptions(s) for a controlled substance can be given.        OBJECTIVE:  Patient Vitals for the past 8 hrs:   BP Temp Temp src Pulse Resp SpO2   06/02/22 0726 110/76 98.4 F (36.9 C) Oral 70 17 98 %   06/02/22 0400 121/69 98.1 F (36.7 C) Oral 66 18 97 %     Temp (24hrs), Avg:98.2 F (36.8 C), Min:98 F (36.7 C), Max:98.4 F (36.9 C)    No intake/output data recorded.    Physical Exam:  Constitutional: Well developed, well nourished female in no acute distress, sitting comfortably on the hospital bed.   HEENT: Normocephalic and atraumatic. Oropharynx is clear, mucous membranes are moist.   Extraocular muscles are intact.  Sclerae anicteric. Neck supple without JVD. No thyromegaly present.    Skin Warm and dry.  No bruising and no rash noted.  No erythema.  No pallor.    Neuro Grossly nonfocal with no obvious sensory or motor deficits.   MSK Normal range of motion in general.  No edema and no tenderness.   Psych Appropriate mood and affect.    Full exam per attending MD    ASSESSMENT:    Principal Problem:    Sickle cell crisis (HCC)  Active Problems:    Migraines    History of seizure    Mastitis    Bug bite  Resolved Problems:    * No resolved hospital problems. *      DISPOSITION:    DC home. FU with Dr Welton Flakes as scheduled (appt requested).      Over 45 minutes was spent in discharge planning and coordination of care.            Aron Baba, APRN - CNP   [facility]  [address]  [place] [address]  Office : [phone]  Fax : [phone]     I personally saw, exammed and counselled the patient, and discussed with NP, agree with above history/assessment/plan. 46 y.o.female sickle cell disease admitted for for pain crisis, IV Dilaudid and Norco as above, Xarelto for history of DVT, Keppra for history of seizure, complaining of left breast swelling and exam showed eminent vein marks risk concern of SVC syndrome however CT chest is negative, empirically treat for mastitis and reports improvement today, discussed transition to oral pain medicine and she reports allergy to morphine and OxyContin but not oxycodone or Norco, also reports fentanyl patch caused her stroke, not completely making sense but able to control pain better with p.o. Dilaudid, discharge as planned above and following office as scheduled, call as needed.      Omer Jack, M.D.  [address] [facility]  [address]  [place], [place] [address]  Office : [phone]  Fax : [phone]

## 2022-06-02 NOTE — Care Coordination-Inpatient (Signed)
Pt to d/c home today.  Family will provide transportation.  Pt is independent with ADLs at baseline.  There were no PT/OT consults ordered during this hospitalization.  Pt's pain is now controlled with PO meds.    No supportive care needs identified.  Pt agrees with d/c plan.  Milestones met.  Pt will f/u OP with PCP and oncology.  LOS = 6 days       05/27/22 2212   Service Assessment   Patient Orientation Alert and Oriented;Person;Place;Self   Cognition Alert   History Provided By Medical Record;Patient   Primary Caregiver Self   Accompanied By/Relationship N/A   Support Systems Parent   Patient's Healthcare Decision Maker is: Scientist, research (physical sciences) Next of Kin   PCP Verified by CM Yes  Crosby Oyster, DO)   Last Visit to PCP Within last 6 months   Prior Functional Level Independent in ADLs/IADLs   Current Functional Level Independent in ADLs/IADLs   Can patient return to prior living arrangement Yes   Ability to make needs known: Good   Family able to assist with home care needs: Yes   Would you like for me to discuss the discharge plan with any other family members/significant others, and if so, who? No   Financial Resources Medicaid;Medicare  [facility] MCR)   Community Resources None   Social/Functional History   Lives With Parent   Type of [place] One level   [place] None   Receives Help From Family   ADL Assistance Independent   Homemaking Assistance Independent   Ambulation Assistance Independent   Transfer Assistance Independent   Active Driver Yes   Mode of Scientist, product/process development   Occupation On disability   Discharge Planning   Type of [place] Parent   Current Services Prior To Admission None   Potential Assistance Needed N/A   DME Ordered? No   Potential Assistance Purchasing Medications No   Type of Home Care Services None   Patient expects to be discharged to: House   One/Two Story Residence One story   History of falls? 0   Services At/After  Discharge   Transition of Care Consult (CM Consult) Discharge [place] Discharge None   Veteran Resource Information Provided? No   Mode of Transport at Discharge Other (see comment)  (Family)   Confirm Follow Up Transport Family   Condition of Participation: Discharge Planning   The Plan for Transition of Care is related to the following treatment goals: Pt to obtain care to become medically stable and to return with a safe transition.   The Patient and/or Patient Representative was provided with a Choice of Provider? Patient   The Patient and/Or Patient Representative agree with the Discharge Plan? Yes   Freedom of Choice list was provided with basic dialogue that supports the patient's individualized plan of care/goals, treatment preferences, and shares the quality data associated with the providers?  Yes

## 2022-06-02 NOTE — Progress Notes (Signed)
Discharge instructions complete. Wheeled down in wheelchair

## 2022-06-02 NOTE — Plan of Care (Signed)
Problem: Pain  Goal: Verbalizes/displays adequate comfort level or baseline comfort level  06/02/2022 1459 by Collie Siad. Phuoc Huy, RN  Outcome: Adequate for Discharge  06/02/2022 [phone] by Collie Siad Kahdijah Errickson, RN  Outcome: Progressing     Problem: Safety - Adult  Goal: Free from fall injury  06/02/2022 1459 by Judeth Cornfield D. Deante Blough, RN  Outcome: Adequate for Discharge  06/02/2022 [phone] by Collie Siad Nichole Neyer, RN  Outcome: Progressing     Problem: ABCDS Injury Assessment  Goal: Absence of physical injury  06/02/2022 1459 by Collie Siad. Tavon Magnussen, RN  Outcome: Adequate for Discharge  06/02/2022 [phone] by Collie Siad Tarik Teixeira, RN  Outcome: Progressing     Problem: Skin/Tissue Integrity  Goal: Absence of new skin breakdown  Description: 1.  Monitor for areas of redness and/or skin breakdown  2.  Assess vascular access sites hourly  3.  Every 4-6 hours minimum:  Change oxygen saturation probe site  4.  Every 4-6 hours:  If on nasal continuous positive airway pressure, respiratory therapy assess nares and determine need for appliance change or resting period.  06/02/2022 1459 by Collie Siad Phyllis Whitefield, RN  Outcome: Adequate for Discharge  06/02/2022 [phone] by Collie Siad Debby Clyne, RN  Outcome: Progressing     Problem: Chronic Conditions and Co-morbidities  Goal: Patient's chronic conditions and co-morbidity symptoms are monitored and maintained or improved  06/02/2022 1459 by Judeth Cornfield D. Dorthie Santini, RN  Outcome: Adequate for Discharge  06/02/2022 [phone] by Collie Siad Charlsie Merles, RN  Outcome: Progressing

## 2022-06-02 NOTE — Progress Notes (Signed)
Palliative Care Progress Note    Patient: [name] Bright MRN: [id number]  SSN: [id number]    Date of Birth: Oct 12, 1976  Age: 46 y.o.  Sex: female       Assessment/Plan:     Chief Complaint/Interval History: pt feels pain control is improving.      Principal Diagnosis:    Pain, general  R52    Additional Diagnoses:   Counseling, Encounter for Medical Advice  Z71.9  Encounter for Palliative Care  Z51.5    Palliative Performance Scale (PPS)       Medical Decision Making:   Reviewed and summarized labs and imaging from past 24 hours.  Pain is improving.  Will increase dilaudid to 4 mg tabs q 4 hr prn pain.  Updated oncology NP on changes.  If plans for discharge recommend dilaudid 4 mg po tabs and follow up in clinic.      Will discuss findings with members of the interdisciplinary team.      More than 50% of this 25 minute visit was spent counseling and coordination of care as outlined above.    Subjective:     Comprehensive Review of Systems completed and negative with the exception of as noted above     Objective:     Visit Vitals  BP 110/76   Pulse 70   Temp 98.4 F (36.9 C) (Oral)   Resp 17   Ht 5\' 8"  (1.727 m)   Wt 168 lb (76.2 kg)   SpO2 98%   BMI 25.54 kg/m       Physical Exam:    General:  Cooperative. No acute distress.   Eyes:  Conjunctivae/corneas clear    Nose: Nares normal. Septum midline.   Neck: Supple, symmetrical, trachea midline, no JVD   Lungs:   Clear to auscultation bilaterally, unlabored   Heart:  Regular rate and rhythm, no murmur    Abdomen:   Soft, non-tender, non-distended   Extremities: Normal, atraumatic, no cyanosis or edema   Skin: Skin color, texture, turgor normal. No rash or lesions.   Neurologic: Nonfocal   Psych: Alert and oriented     Signed By: , MD     June 02, 2022

## 2022-06-02 NOTE — Plan of Care (Signed)
Problem: Pain  Goal: Verbalizes/displays adequate comfort level or baseline comfort level  Outcome: Progressing     Problem: Safety - Adult  Goal: Free from fall injury  Outcome: Progressing     Problem: ABCDS Injury Assessment  Goal: Absence of physical injury  Outcome: Progressing     Problem: Skin/Tissue Integrity  Goal: Absence of new skin breakdown  Description: 1.  Monitor for areas of redness and/or skin breakdown  2.  Assess vascular access sites hourly  3.  Every 4-6 hours minimum:  Change oxygen saturation probe site  4.  Every 4-6 hours:  If on nasal continuous positive airway pressure, respiratory therapy assess nares and determine need for appliance change or resting period.  Outcome: Progressing     Problem: Chronic Conditions and Co-morbidities  Goal: Patient's chronic conditions and co-morbidity symptoms are monitored and maintained or improved  Outcome: Progressing

## 2022-06-14 ENCOUNTER — Encounter

## 2022-06-14 ENCOUNTER — Other Ambulatory Visit: Payer: MEDICARE | Primary: Student in an Organized Health Care Education/Training Program

## 2022-06-14 ENCOUNTER — Ambulatory Visit
Admit: 2022-06-14 | Discharge: 2022-06-14 | Payer: MEDICARE | Attending: Hematology | Primary: Student in an Organized Health Care Education/Training Program

## 2022-06-14 DIAGNOSIS — D5700 Hb-SS disease with crisis, unspecified: Secondary | ICD-10-CM

## 2022-06-14 LAB — CBC WITH AUTO DIFFERENTIAL
Absolute Immature Granulocyte: 0 10*3/uL (ref 0.0–0.5)
Basophils %: 0 % (ref 0.0–2.0)
Basophils Absolute: 0 10*3/uL (ref 0.0–0.2)
Eosinophils %: 1 % (ref 0.5–7.8)
Eosinophils Absolute: 0 10*3/uL (ref 0.0–0.8)
Hematocrit: 26.1 % — ABNORMAL LOW (ref 35.8–46.3)
Hemoglobin: 9 g/dL — ABNORMAL LOW (ref 11.7–15.4)
Immature Granulocytes: 1 % (ref 0.0–5.0)
Lymphocytes %: 42 % (ref 13–44)
Lymphocytes Absolute: 1.4 10*3/uL (ref 0.5–4.6)
MCH: 39.6 PG — ABNORMAL HIGH (ref 26.1–32.9)
MCHC: 34.5 g/dL (ref 31.4–35.0)
MCV: 115 FL — ABNORMAL HIGH (ref 82.0–102.0)
MPV: 9.8 FL (ref 9.4–12.3)
Monocytes %: 6 % (ref 4.0–12.0)
Monocytes Absolute: 0.2 10*3/uL (ref 0.1–1.3)
Neutrophils %: 50 % (ref 43–78)
Neutrophils Absolute: 1.8 10*3/uL (ref 1.7–8.2)
RBC: 2.27 M/uL — ABNORMAL LOW (ref 4.05–5.2)
WBC: 3.4 10*3/uL — ABNORMAL LOW (ref 4.3–11.1)
nRBC: 0.48 10*3/uL — ABNORMAL HIGH (ref 0.0–0.2)

## 2022-06-14 LAB — COMPREHENSIVE METABOLIC PANEL
ALT: 20 U/L (ref 12–65)
AST: 39 U/L — ABNORMAL HIGH (ref 15–37)
Albumin/Globulin Ratio: 0.8 (ref 0.4–1.6)
Albumin: 3.9 g/dL (ref 3.5–5.0)
Alk Phosphatase: 99 U/L (ref 50–136)
Anion Gap: 2 mmol/L (ref 2–11)
BUN: 8 MG/DL (ref 6–23)
CO2: 19 mmol/L — ABNORMAL LOW (ref 21–32)
Calcium: 9.1 MG/DL (ref 8.3–10.4)
Chloride: 111 mmol/L — ABNORMAL HIGH (ref 101–110)
Creatinine: 1.2 MG/DL — ABNORMAL HIGH (ref 0.6–1.0)
Est, Glom Filt Rate: 56 mL/min/{1.73_m2} — ABNORMAL LOW (ref >60)
Globulin: 4.9 g/dL — ABNORMAL HIGH (ref 2.8–4.5)
Glucose: 74 mg/dL (ref 65–100)
Potassium: 4.9 mmol/L (ref 3.5–5.1)
Sodium: 132 mmol/L — ABNORMAL LOW (ref 133–143)
Total Bilirubin: 1 MG/DL (ref 0.2–1.1)
Total Protein: 8.8 g/dL — ABNORMAL HIGH (ref 6.3–8.2)

## 2022-06-14 LAB — VITAMIN D 25 HYDROXY: Vit D, 25-Hydroxy: 13.1 ng/mL — ABNORMAL LOW (ref 30.0–100.0)

## 2022-06-14 LAB — D-DIMER, QUANTITATIVE: D-Dimer, Quant: 0.39 ug/ml(FEU) (ref <0.56)

## 2022-06-14 MED ORDER — VITAMIN D (ERGOCALCIFEROL) 1.25 MG (50000 UT) PO CAPS
1.2550000 MG (50000 UT) | ORAL_CAPSULE | Freq: Every day | ORAL | 0 refills | Status: DC
Start: 2022-06-14 — End: 2023-06-20
  Filled 2022-06-14: qty 14, 14d supply, fill #0

## 2022-06-14 MED ORDER — HYDROMORPHONE HCL 2 MG PO TABS
2 MG | ORAL_TABLET | Freq: Two times a day (BID) | ORAL | 0 refills | Status: AC | PRN
Start: 2022-06-14 — End: 2022-07-14
  Filled 2022-06-14: qty 60, 30d supply, fill #0

## 2022-06-14 NOTE — Progress Notes (Signed)
[facility]  [address]  [place], [place] [address]  Phone: [phone]           06/14/2022  [name] Bright  Feb 07, 1976  [id number]         [name] Bright is a 46 year old African-American female who has returned to my clinic for a follow-up visit; she was initially referred to me by Dr. Delfino Lovett for management of Sickle Cell Disease; she is on Hydrea 1000 mg/day.        ALLERGIES:    Latex.        FAMILY HISTORY:    Her brother died of Sickle Cell Crisis.        SOCIAL HISTORY:    She is single and lives alone. She is on SSI. She denies ever using any tobacco products.        PAST MEDICAL HISTORY:     Migraines, Seizures, Vitamin D deficiency, right arm DVT, Pulmonary Embolism, Depression, splenic infarction, GERD, renal insufficiency, Osteoarthritis, SVC thrombosis and Sickle Cell Disease.        ROS:  The patient complained of fatigue and exertional dyspnea; all other systems negative.        PHYSICAL EXAM:   The patient was alert, awake and oriented, no acute distress was noted. Oral examination did not reveal any mucosal lesions. Lymph node examination did not reveal any adenopathy. Neck examination revealed a supple neck, no thyromegaly or masses were noted. Chest examination revealed normal vesicular breath sounds. Heart examination revealed S-1 and S-2 without any murmurs. Abdominal examination revealed a non-tender abdomen, bowel sounds were positive, no organomegaly could be appreciated. Examination of the extremities did not reveal any tenderness or erythema. Examination of the skin did not reveal any lesions.  Medical problems and test results were reviewed with the patient today.        KPS:    80.        LABORATORY INVESTIGATIONS:  CBC showed a WBC count of 3.4, ANC was 1.8, Hemoglobin was 9.1 and Platelets were 314.        ASSESSMENT:    Sickle Cell Disease with pain; Vitamin D deficiency; Pulmonary Embolism. I spent a total of 42 minutes on the day of the visit, managing the  care of this patient.        PLAN:    She should continue taking Xarelto 20 mg/day, Folic acid 1 mg/day, Hydrea 1000 mg/day and supplemental Vitamin D; at her next clinic visit I will re-check her CBC, CMP, D-dimers and Vitamin D level.        Sharin Grave MD  Hematology/BMT

## 2022-06-14 NOTE — Patient Instructions (Addendum)
Patient Instructions from Today's Visit    Reason for Visit:  Follow Up    Diagnosis Information:  https://www.cancer.net/about-us/asco-answers-patient-education-materials/asco-answers-fact-sheets    Plan:  Lab work done in the hospital is stable  Vitamin D is low, so we will send in a prescription for Vitamin D 50,000 units    -take 1 pill daily for 14 days    - when you finish the prescription, start taking over the counter Vitamin D 1,000 units daily  We will also send in a month of Dilaudid for pain    Follow Up:  We will bring you back in November for a follow up with Dr.Khan and lab work prior.    Recent Lab Results:  Lab work done today downstairs      Treatment Summary has been discussed and given to patient: n/a        -------------------------------------------------------------------------------------------------------------------  Please call our office at [phone] if you have any  of the following symptoms:   Fever of 100.5 or greater  Chills  Shortness of breath  Swelling or pain in one leg    After office hours an answering service is available and will contact a provider for emergencies or if you are experiencing any of the above symptoms.    Patient has My Chart.  My Chart log in information explained on the after visit summary printout at the check-out desk.    Caesar Chestnut, MA

## 2022-10-10 ENCOUNTER — Encounter

## 2022-10-11 ENCOUNTER — Encounter: Payer: MEDICARE | Attending: Hematology | Primary: Student in an Organized Health Care Education/Training Program

## 2022-10-11 ENCOUNTER — Encounter: Payer: MEDICARE | Primary: Student in an Organized Health Care Education/Training Program

## 2022-10-16 MED ORDER — HYDROXYUREA 500 MG PO CAPS
500 MG | ORAL_CAPSULE | Freq: Every day | ORAL | 0 refills | Status: AC
Start: 2022-10-16 — End: 2022-12-15
  Filled 2022-11-07: qty 60, 30d supply, fill #0

## 2022-12-22 NOTE — ED Notes (Signed)
Formatting of this note might be different from the original.  Bed: 21  Expected date:   Expected time:   Means of arrival: Ambulance  Comments:    Lutricia Feil, RN  12/22/22 0354    Electronically signed by Lutricia Feil, RN at 12/22/2022  3:54 AM EST

## 2022-12-22 NOTE — ED Provider Notes (Signed)
Formatting of this note is different from the original.  Date of service:  12/22/2022    Nursing notes reviewed.     HPI     Chief Complaint   Patient presents with    Chest Pain     Chest pain with radiation into left arm. Pain with inspiration. Hx of sickle cell.      This is a 47 year old female coming in for evaluation of "sickle cell pain" and " my veins are swollen".  She explains that the veins have been dilated in the left upper extremity and left chest for the last several years.  She has a history of DVT, and has been on Xarelto.  She has been compliant with her Xarelto.  She denies associated shortness of breath, painful breathing.  She has had a cough for the last 2 days.  She denies fevers or chills.  Denies headache.  Denies focal numbness or weakness.  She has no other complaints.     Patient History     Past Medical History:   Diagnosis Date    Depression     Difficult intravenous access     GERD (gastroesophageal reflux disease)     History of blood clots     Hypokalemia 07/13/2021    Iron overload 05/25/2018    Migraines     Osteoarthritis     Seizures (CMS/HCC)     Sickle cell anemia (CMS/HCC)     Stroke (CMS/HCC)      Past Surgical History:   Procedure Laterality Date    ABDOMINAL SURGERY      exploratory    CESAREAN SECTION      CHOLECYSTECTOMY      TUBAL LIGATION       Family History   Problem Relation Age of Onset    Sickle cell anemia Brother         Died at age 63 from sickle crisis      Social History     Tobacco Use    Smoking status: Never    Smokeless tobacco: Never   Vaping Use    Vaping Use: Never used   Substance and Sexual Activity    Alcohol use: No    Drug use: No    Sexual activity: Defer      Review of Systems     Review of Systems   All other systems reviewed and are negative.     Physical Exam     ED Triage Vitals [12/22/22 0349]   Temp Pulse Resp BP SpO2   97.1 F (36.2 C) 90 20 115/75 93 %     Temp Source Heart Rate Source Patient Position BP Location FiO2 (%)   Oral -- -- --  --     Physical Exam  Vitals and nursing note reviewed.   Constitutional:       General: She is not in acute distress.     Appearance: She is well-developed.   HENT:      Head: Normocephalic and atraumatic.   Eyes:      Conjunctiva/sclera: Conjunctivae normal.   Cardiovascular:      Rate and Rhythm: Normal rate and regular rhythm.      Heart sounds: No murmur heard.  Pulmonary:      Effort: Pulmonary effort is normal. No respiratory distress.      Breath sounds: Normal breath sounds.   Abdominal:      Palpations: Abdomen is soft.      Tenderness: There  is no abdominal tenderness.   Musculoskeletal:      Cervical back: Neck supple.      Comments: Dilated proximal left upper extremity veins, and left chest veins.  Mild tenderness to palpation   Skin:     General: Skin is warm and dry.      Capillary Refill: Capillary refill takes less than 2 seconds.   Neurological:      General: No focal deficit present.      Mental Status: She is alert.   Psychiatric:         Mood and Affect: Mood normal.     Procedures/CriticalCare        ED Course & MDM     ED Course as of 12/22/22 0859   Fri Dec 22, 2022   0413 EKG as reviewed by myself shows a sinus tachycardic, 107 beats per minute, Q-waves laterally are essentially unchanged from prior, prolonged QT noted, no acute ST changes concerning for ischemia [PG]   0602 Patient re-evaluated, non 91% on room air, troponin negative, given the patient's hypoxia, elevated take it the site count, CTA will be obtained to ensure the patient does not have a PE.  Additional pain medications were ordered.  Patient is signed out to day team pending further evaluation. [PG]   [id number] Care of the patient at shift change from Dr. Michiel Sites.  Patient is pending CT angiogram chest [LF]   0806 CTA report reviewed.  From Radiology, no evidence of pulmonary thromboembolism.  No evidence of pulmonary infiltrates no pneumothorax, no pleural effusions.  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  Also noted that the patient desaturates off oxygen quickly to 81% nursing staff advised that earlier the patient was off oxygen sleeping was snoring loudly and had O2 sat of 55% I will consult Medical service for evaluation [LF]   [phone] Case discussed with patient's family medicine service they will evaluate [LF]     ED Course User Index  [LF] Aquilla Hacker, MD  [PG] Katheran James, MD         Medical Decision Making  47 year old female coming in for "sickle cell pain", as well as dilated upper extremity and chest wall veins, however this appears to have been chronic, on review of her chart, she has had CTs in the past for similar concerns, and the dilated veins have been ongoing for quite some time.  EKG is nonspecific and overall unchanged from prior.  Patient was treated for her sickle cell crisis with pain medication, she requested medication for itching, she was given p.o. Benadryl.      Amount and/or Complexity of Data Reviewed  Labs: ordered.  Radiology: ordered.  ECG/medicine tests: ordered.    Risk  OTC drugs.  Prescription drug management.  Decision regarding hospitalization.        ED Clinical Impression:    Final diagnoses:   [D57.00] Sickle cell pain crisis (CMS/HCC)   [R09.02] Hypoxemia     ED Disposition:  Admit    I reviewed all of the labs, imaging results, and prescriptions written with the patient (and/or responsible party) and they reflected back to me an understanding of this information.     Please note that this chart may contain entries made  using voice recognition or similar or related smart technology; consequently, inadvertent word substitutions and grammatical errors may exist.      Orvan Falconer, MD  12/22/22 0604      Marcelline Mates,  MD  12/22/22 [phone]    Electronically signed by Marcelline Mates, MD at 12/22/2022  8:59 AM EST

## 2022-12-22 NOTE — ED Notes (Signed)
Formatting of this note might be different from the original.  Pts O2 dropped to 56% perfect waveform. Pt was found to be sleeping. Pt woken up and placed on 3 L/min nasal cannula. Pt denies hx of sleep apnea. This RN notified Dr. Domingo Cocking, MD.    Julaine Fusi, [place]  12/22/22 [phone]    Electronically signed by Julaine Fusi, RN at 12/22/2022  7:37 AM EST

## 2022-12-22 NOTE — Care Plan (Signed)
Formatting of this note might be different from the original.      Care plan updated 12/22/22, Shelba Flake, RN      Electronically signed by Shelba Flake, RN at 12/22/2022  6:41 PM EST

## 2022-12-22 NOTE — Progress Notes (Signed)
Formatting of this note is different from the original.  ATS Back-Up Resident H&P    Primary Resident: Aron Baba, MD  Attending Physician: [name], MD     Patient ID  Name: [name] Bright  DOB: 16-May-1976  MRN: [id number]  PCP: Elmon Else, MD    Chief Complaint   Patient presents with    Chest Pain     Chest pain with radiation into left arm. Pain with inspiration. Hx of sickle cell.      SUBJECTIVE     47 y.o. 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  Patient developed chest and bilateral upper extremity pain on Saturday (1/13), shortly after helping her mother up off the floor.  She has experienced persistent symptoms since, describing intermittent pain in all 4 extremities.  She notes chest discomfort at times, which is nonexertional and not improved with rest.  Symptoms are associated with shortness of breath, cough, and sore throat.  She denies recent fevers or chills.  Endorses persistent nausea, no vomiting or diarrhea.  Decreased appetite but reports adequate PO hydration.  States that current symptoms are similar to prior pain crises.    Patient sees Dr. Floyde Parkins in [place] for management of Sickle Cell Anemia.  She endorses compliance with daily hydroxyurea and folic acid.  She does not take pain medications on a daily basis, but is prescribed Dilaudid PO.  She does not smoke cigarettes.      Patient had a similar hospitalization in June 2023 at [facility] in [place].  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  She was also found to have a 4 mm left upper lobe nodule, for which Radiology recommended 12 month follow-up.    ED Course:  Hemoglobin 8.4, near recent baseline.  MCV elevated (113).  Absolute reticulocyte count 6.3.  Reticulocyte index 3.14, suggestive of adequate response.  Creatinine and electrolytes appropriate.   Procalcitonin negative.  EKG with normal sinus rhythm, no acute ischemic changes.  Troponins negative.  CTA chest negative for PE.  Did show redemonstration of a abandoned catheter segment within the SVC, with associated severe focal narrowing versus occlusion at the inferior aspect of the catheter, resulting in extensive collateralization throughout the chest wall.    (Please see primary resident's H/P for more details on this admission)    OBJECTIVE    Vitals:    12/22/22 1131 12/22/22 1201 12/22/22 1231 12/22/22 1301   BP: 130/83 135/80 126/85 128/68   Pulse: 80 85 81 98   Resp:       Temp:       TempSrc:       SpO2: 97% 99% 98% 98%   Weight:           Physical Exam   Physical Exam  Vitals and nursing note reviewed.   Constitutional:       General: She is not in acute distress.     Appearance: She is well-developed.   HENT:      Head: Normocephalic and atraumatic.   Eyes:      Extraocular Movements: EOM normal.      Conjunctiva/sclera: Conjunctivae normal.   Cardiovascular:      Rate and Rhythm: Normal rate and regular rhythm.      Pulses: Normal pulses.      Heart sounds: Normal heart sounds. No murmur heard.     No friction rub.  Pulmonary:      Effort: Pulmonary effort is normal. No respiratory distress.      Breath sounds: Normal breath sounds. No wheezing or rales.   Abdominal:      General: Bowel sounds are normal. There is no distension.      Palpations: Abdomen is soft.      Tenderness: There is no abdominal tenderness.   Musculoskeletal:      Cervical back: Normal range of motion.      Comments: All 4 extremities equally tender to palpation   Skin:     Capillary Refill: Capillary refill takes less than 2 seconds.   Neurological:      Mental Status: She is alert and oriented to person, place, and time.   Psychiatric:         Mood and Affect: Mood and affect normal.         Behavior: Behavior normal.     Labs  CBC  Results from last 7 days   Lab Units 12/22/22  0412   WBC K/microL 6.12   HGB g/dL 8.4*    HCT % 23.6*   PLT K/microL 353       BMP/CMP  Results from last 7 days   Lab Units 12/22/22  0501   SODIUM mmol/L 140   POTASSIUM mmol/L 3.8   CHLORIDE mmol/L 107   CARBON DIOXIDE mmol/L 21*   BUN mg/dL 6.6   CREATININE mg/dL 0.96*   CALCIUM mg/dL 9.3   GLUCOSE mg/dL 100*     EKG/Imaging  CT Angiogram Chest Pulmonary Embolism w/ Contrast  Accession(s): [id number]    EXAM: CT ANGIOGRAM CHEST PULMONARY EMBOLISM W CONTRAST    HISTORY: 47 year-old female with PE suspected, high pretest prob. Sickle cell,  hypoxic, tachycardic.    TECHNIQUE: IV contrast was administered, no oral contrast was administered  Arterial phase - chest  Reconstructions - coronal and sagittal  3-D imaging - axial MIP    COMPARISON: 10/11/2020.    NUMBER OF REPORTED CT SCANS AND MYOCARDIAL PERFUSION STUDIES IN THE PAST 12  MONTHS: 1.    All CT scans are performed using radiation dose reduction techniques.  Technical  factors are evaluated and adjusted to ensure appropriate moderation of exposure.   Automated dose management technology is applied to adjust the radiation dose to  minimize exposure while achieving a diagnostic quality image.    PROCEDURE RADIATION DOSE: DLP: 273.58, mGy.cm/Effective Dose: 3.16, mSv    FINDINGS:  Lines and tubes: There is a short segment of abandoned catheter within the SVC,  similar to prior exam.    Cardiovascular: Technically adequate exam of the pulmonary arteries. No  pulmonary embolism identified.   No thoracic aortic aneurysm or dissection identified.  No cardiomegaly or pericardial effusion.   Coronary artery atherosclerotic calcification: None detected.   As before, the SVC contains a short segment of abandoned catheter material, and  there is severe narrowing versus focal occlusion at the inferior aspect of this  catheter (series 2 image 42) resulting in significant collateralization  throughout the anterior and right lateral chest wall, also similar to prior  exam.    Mediastinum and neck: No adenopathy by  CT size criteria. The thyroid gland is  normal.    Lungs and pleura: No pleural effusion. No pneumothorax. No central endobronchial  mass.   No pulmonary opacities.    Abdomen: Cholecystectomy.    Musculoskeletal: No soft tissue masses. No aggressive appearing skeletal  lesions. Severe degenerative changes of  the partially visualized right shoulder.    IMPRESSION:  1.  NO PULMONARY THROMBOEMBOLI IDENTIFIED.  2.  REDEMONSTRATION OF ABANDONED CATHETER SEGMENT WITHIN THE SVC WITH ASSOCIATED  SEVERE FOCAL NARROWING VERSUS OCCLUSION AT THE INFERIOR ASPECT OF THE CATHETER  RESULTING IN EXTENSIVE COLLATERALIZATION THROUGHOUT THE CHEST WALL.    RECOMMENDATIONS: NO ADDITIONAL RECOMMENDATIONS.  Dictated by: Jonn Shingles on 12/22/2022  7:52 AM  Transcribed by: Adonis Huguenin on 12/22/2022  7:52 AM  Electronically verified by: Jonn Shingles on 12/22/2022  9:19 AM  X-ray chest portable single  Accession(s): [id number]    EXAM: XR CHEST PORTABLE SINGLE 12/22/2022 5:43 AM    HISTORY: 47 years-old Female with Chest Pain    TECHNIQUE: Frontal view of the chest.     COMPARISON: 10/11/2020.    FINDINGS:   Lines and tubes: None.    Cardiomediastinal: The cardiomediastinal silhouette is unremarkable.    Lungs and pleura: The lungs are clear. No pleural effusion. No pneumothorax.    Musculoskeletal: No acute skeletal abnormality.    Other: None.    IMPRESSION:  1.  No acute cardiopulmonary process identified.     RECOMMENDATIONS: No additional recommendations.   Dictated by: Jonn Shingles on 12/22/2022  8:02 AM  Transcribed by: Jonn Shingles on 12/22/2022  8:02 AM  Electronically verified by: Jonn Shingles on 12/22/2022  8:02 AM  EKG 12-LEAD in 2 Hours  Normal sinus rhythm  Nonspecific T wave abnormality  Abnormal ECG    Confirmed by Leanora Cover, Satish ([id number]) on 12/22/2022 7:53:39 AM  ECG 12 lead  Sinus tachycardia  Prolonged QT  Abnormal ECG    Confirmed by Leanor Rubenstein ([id number]) on 12/22/2022 7:53:25 AM      ASSESSMENT/PLAN    47 y.o. 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    #Sickle Cell Pain Crisis  Presentation similar to prior crises. Not on daily opioids outpatient. Managed by Dr. Sharin Grave in [place].  -Dilaudid 2 mg q2h PRN. Space as tolerated.  -Continue home hydroxyurea and folate  -IV fluid resuscitation  -Supplemental O2 PRN to maintain sats>92%  -Transfusion threshold Hgb <7 or symptomatic anemia    #History of Recurrent DVT's  Known chronic LLE DVT.  -Continue home Xarelto  -Check LE venous duplex    #History of Seizures  -Continue home Keppra    #SVC Thrombus  Unchanged on imaging in the ED with extensive collateralization throughout the chest wall. Evaluated by Vascular Surgery in 2019, who did not recommend intervention.     #LUL Pulmonary Nodule  -Noted incidentally during hospitalization in June 2023. Due for repeat imaging in June 2024.  -Follow-up with [facility]    See primary H&P for full list of chronic problems and their management while inpatient.    Code: Full code  Lines/Foley: peripheral IV  Fluids: LR @ 100 ml/hr  Diet: Regular   Ppx: Xarelto  Dispo: Admit to Gen Med floor, made inpatient status, final dispo pending adequate pain control and return to baseline respiratory status    Please page Primary Resident on Call. Refer to Web on Call.     Johny Blamer, MD  12/22/2022   Electronically signed by Johny Blamer, MD at 12/22/2022  2:17 PM EST

## 2022-12-22 NOTE — H&P (Signed)
Formatting of this note is different from the original.  HISTORY AND PHYSICAL    Primary Resident:  [name] Bright  Backup Resident:  [name] Bright  Attending Physician:  [name]    Chief Complaint: Pain    History of Present Illness:      47 y.o F presenting to ED c/o pain ongoing over the last 3 weeks. Worsened to point of coming in today. Pain located in L arm, chest, sides of abdomen, and bilateral legs.   Med hx significant for SCD. Pain crises in past.     Followed by Dr. [name], hematology, in [place]. History of multiple ports in past, ports in neck, chest, and left arm. All removed. Pt does have a known blockage of her left SVC w/ collateralization.     In ED, pt was noted to have hypoxia. Started on 2 L NC O2 and improved. Was witnessed to have a 55% O2 while sleeping w/ snoring respirations. Attempt made to wean and sats dropped to upper 80s. In ED, workup initiated w/ CTA which was negative for PE, but did show a residual catheter segment within the SVC w/ associated focal narrowing at inferior end. Extensive collateralization. XR negative.     Labwork: CBC shows Hgb 8.4(baseline), low hematocrit, high MCV, nl WBC and platelets. Reticulocytes increased at 240(upper nl 125). Trops negative. EKG negative. Covid flu negative.     Last echo 5 years ago 03/29/16 nl.     In ED, pt given Benadryl, Dilaudid, Zofran, and 1 L NS bolus.     Home meds include Hydroxyurea, Folic acid, Xarelto, Fluoxetine.     Review of Systems  Reports pain as described above.     Past Medical History:  Past Medical History:   Diagnosis Date    Depression     Difficult intravenous access     GERD (gastroesophageal reflux disease)     History of blood clots     Hypokalemia 07/13/2021    Iron overload 05/25/2018    Migraines     Osteoarthritis     Seizures (CMS/HCC)     Sickle cell anemia (CMS/HCC)     Stroke (CMS/HCC)      Past Surgical History:  Past Surgical History:   Procedure Laterality Date    ABDOMINAL SURGERY       exploratory    CESAREAN SECTION      CHOLECYSTECTOMY      TUBAL LIGATION       Allergies: is allergic to ceftriaxone, demerol hcl [meperidine], fentanyl, influenza vaccines, influenza virus vaccine, latex, morphine, oxycodone, and sulfa antibiotics.    Current Medications:  No current facility-administered medications on file prior to encounter.     Current Outpatient Medications on File Prior to Encounter   Medication Sig Dispense Refill    cyclobenzaprine (generic for FLEXERIL) 10mg  tablet Take 1 tablet (10 mg total) by mouth 3 (three) times a day as needed for muscle spasms.      fexofenadine (generic for ALLEGRA) 180mg  tablet Take 1 tablet (180 mg total) by mouth daily as needed.      FLUoxetine (PROzac) 40 MG capsule Take 1 capsule (40 mg total) by mouth daily. 30 capsule 11    folic acid (generic for FOLVITE) 1mg  tablet Take 1 tablet (1 mg total) by mouth daily. 90 tablet 3    hydroxyurea (generic for HYDREA) 500mg  capsule Take 2 capsules (1,000 mg total) by mouth daily. 60 capsule 5    levETIRAcetam (generic for KEPPRA) 750mg   tablet Take 2 tablets (1,500 mg total) by mouth 2 (two) times a day. 60 tablet 11    rivaroxaban (XARELTO) 20mg  tablet Take 1 tablet (20 mg total) by mouth daily with dinner. 90 tablet 2    [DISCONTINUED] cyclobenzaprine (generic for FLEXERIL) 10mg  tablet Take 1 tablet (10 mg total) by mouth 3 (three) times a day as needed for muscle spasms for up to 10 days. 45 tablet 0    [DISCONTINUED] fexofenadine (generic for ALLEGRA) 180mg  tablet Take 1 tablet (180 mg total) by mouth daily. 30 tablet 5     Social History:  Lives w/ mother, denies smoking, alcohol use, or marijuana use.     Family History:  Family History   Problem Relation Age of Onset    Sickle cell anemia Brother         Died at age 61 from sickle crisis      Physical Exam:    Vitals:    12/22/22 1131 12/22/22 1201 12/22/22 1231 12/22/22 1301   BP: 130/83 135/80 126/85 128/68   Pulse: 80 85 81 98   Resp:       Temp:        TempSrc:       SpO2: 97% 99% 98% 98%   Weight:         Physical Exam  General:  Alert, in no obvious distress on exam, afebrile, answers all questions appropriately  Cardiovascular:  Regular rate and rhythm  Pulmonary:  CTA  Abdominal:  Soft, nontender to palpation  Extremities: Left extremity smaller than right, R extremity w/ previous DVT, obvious scars bilaterally medially and laterally from previous surgeries  Skin:  No rashes appreciated, warm and dry, multiple scars noted on abdomen from previous hysterectomy and port placements.     Diagnostic Findings:     CXR IMPRESSION:  1.  No acute cardiopulmonary process identified.     CTA IMPRESSION:  1.  NO PULMONARY THROMBOEMBOLI IDENTIFIED.  2.  REDEMONSTRATION OF ABANDONED CATHETER SEGMENT WITHIN THE SVC WITH ASSOCIATED  SEVERE FOCAL NARROWING VERSUS OCCLUSION AT THE INFERIOR ASPECT OF THE CATHETER  RESULTING IN EXTENSIVE COLLATERALIZATION THROUGHOUT THE CHEST WALL.    RECOMMENDATIONS: NO ADDITIONAL RECOMMENDATIONS.    Problem List       1. * (Principal) Sickle cell pain crisis (CMS/HCC)    2. Seizure disorder (CMS/HCC)    3. Superior vena cava occlusion (CMS/HCC)    4. Depression    5. Thrombocythemia    6. History of deep venous thrombosis    7. Migraine without aura and without status migrainosus, not intractable    8. History of pulmonary embolism    9. Pulmonary nodule       Impression:  47 y.o F w/ PMH of SCD presenting w/ acute pain crisis. CXR negative and CTA negative for embolus but significant for abandoned catheter segment.  Pt noted to have significant desaturations in ED. Reassuring on physical exam w/ nl vitals on 2 L NC O2.     Plan:    Acute Pain Crisis:  Sx c/w sickle cell pain crisis. Does have history of same. Received 1 L NS bolus in ED. Will add 100 ml/hr NS over next 20 hours.   Daily CMP and CBC.  Continue Dilaudid for pain. Wean as able. Will start at 2 ml q2 hr PRN.   Continue home folic acid and hydroxyurea.   Naloxone added PRN.    Zofran PRN.     Shortness of breath:  Unknown etiology. Ruled out acute chest syndrome w/ Chest XR. PE ruled out w/ CTA. Of note, CTA did show abandoned catheter segment in SVC. Noted to desat in ED while sleeping. Snoring respirations noted. Will order CPAP machine PRN for tonight. Sleep study on DC to evaluate for OSA. Maintain sats greater than 90 w/ NC O2. Comfortable on exam w/ nl O2 sats. Icentive spirometer. Will add breathing treatments as necessary. Pt satting >95% on exam on 2 L NC O2 and breathing comfortably. CTM.    Seizures:  H/o seizures. Last in 2018. 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 Continue home Xarelto. Pt does currently have assymmetric lower extremities, right larger than left. Pt reports this is chronic. Reassuringly, good pulses, no paresthesias or paralysis. Evaluate for any changes w/ bilateral venous dopplers.     Anxiety:   Home Prozac added.    [name] Baba, MD  12/22/2022      FEN: Regular diet, 1 L bolus,   Lines/Foley: PIV  PPX: Xarelto  Code: Full  Dispo: Admit to obs, final dispo pending improvement of presumed SCD pain crisis    Electronically signed by Rivka Barbara, MD at 12/22/2022  4:38 PM EST    Associated attestation - Rivka Barbara, MD - 12/22/2022  4:38 PM EST  Formatting of this note is different from the original.  I saw and evaluated the patient, participating in the key portions of the service.  I reviewed the resident?s note.  I agree with the resident?s findings and plan.

## 2022-12-22 NOTE — H&P (Signed)
Formatting of this note is different from the original.  HISTORY AND PHYSICAL    Chief Complaint:  Chest pain    History of Present Illness:    Patient is a 47 y.o. female presenting with chest pain that radiates towards left arm, history of sickle cell disease but no known history of ASCVD.  Patient feels her chest pain maybe from having to ?lift my mother?    Subjective   Past Medical History: has a past medical history of Depression, Difficult intravenous access, GERD (gastroesophageal reflux disease), History of blood clots, Hypokalemia (07/13/2021), Iron overload (05/25/2018), Migraines, Osteoarthritis, Seizures (CMS/HCC), Sickle cell anemia (CMS/HCC), and Stroke (CMS/HCC).    She has no past medical history of Carbapenem-resistant Enterobacteriaceae infection, Clostridium difficile infection, ESBL (extended spectrum beta-lactamase) producing bacteria infection, MRSA (methicillin resistant Staphylococcus aureus), Tuberculosis, Unspecified infectious disease, or Vancomycin resistant Enterococcus.   Past Surgical History: has a past surgical history that includes Cesarean section; Tubal ligation; Cholecystectomy; and Abdominal surgery.  Allergies: is allergic to ceftriaxone, demerol hcl [meperidine], fentanyl, influenza vaccines, influenza virus vaccine, latex, morphine, oxycodone, and sulfa antibiotics.  Current Medications: FLUoxetine, cyclobenzaprine, fexofenadine, folic acid, hydroxyurea, levETIRAcetam, and rivaroxaban  Social History: reports that she has never smoked. She has never used smokeless tobacco. She reports that she does not drink alcohol and does not use drugs.  Family History: family history includes Sickle cell anemia in her brother.      Objective   Review of Systems   Respiratory:  Positive for shortness of breath. Negative for cough and wheezing.    Cardiovascular:  Positive for chest pain. Negative for palpitations and leg swelling.     Physical Exam  Constitutional:       General: She is not  in acute distress.     Appearance: She is well-developed and well-nourished.   HENT:      Head: Normocephalic and atraumatic.   Eyes:      Pupils: Pupils are equal, round, and reactive to light.   Cardiovascular:      Rate and Rhythm: Normal rate and regular rhythm.   Pulmonary:      Effort: Pulmonary effort is normal.      Breath sounds: Normal breath sounds.   Abdominal:      Palpations: Abdomen is soft.   Musculoskeletal:      Comments: Calves soft and nontender   Skin:     General: Skin is warm and dry.   Neurological:      Mental Status: She is alert and oriented to person, place, and time.   Psychiatric:         Mood and Affect: Mood and affect normal.     Last Vitals: Temp: 97.1 F (36.2 C), Resp: 20, Pulse: 89, BP: 132/73, SpO2: 96 %    Diagnostic Findings: CBC:   Lab Results   Component Value Date    WBC 6.12 12/22/2022    RBC 2.09 (L) 12/22/2022    RBC 2.09 (L) 12/22/2022    HGB 8.4 (L) 12/22/2022    HCT 23.6 (L) 12/22/2022    PLT 353 12/22/2022     BMP:   Lab Results   Component Value Date    GLUCOSE 100 (H) 12/22/2022    SODIUM 140 12/22/2022    POTASSIUM 3.8 12/22/2022    CHLORIDE 107 12/22/2022    CO2 21 (L) 12/22/2022    BUN 6.6 12/22/2022    CREATININE 0.96 (H) 12/22/2022    CA 9.3 12/22/2022  Coagulation: No results found for: "PT", "INR", "PTT", "DDIMER"  Cardiac markers:   Lab Results   Component Value Date    TROPONINT 7 12/22/2022       Assessment/Plan   Problem List    None    The patient was interviewed and examined by me. The patient was discussed with the PGY team.  See the PGY note for further details.   Chest pain negative tropes x2-suspect musculoskeletal, consider sickle cell  Hypoxia temporary Desat without oxygen but saturating fine with supplemental oxygen 96%  Sickle cell anemia-  hemoglobin 8.4  Retic 11.6  History of DVT-continue Xarelto  -Unremarkable labs/workup-strokes, CT chest without pulmonary embolism or acute pulmonary disease but she does have a bandage catheter in the  SVC,  respiratory panel  -FEN--regular  -Soc observation, full code  -Prophy continue home Xarelto          Electronically signed by Rivka Barbara, MD at 12/22/2022 11:55 AM EST

## 2022-12-22 NOTE — Care Plan (Signed)
Formatting of this note might be different from the original.      Care plan updated 12/22/22, Alonza Bogus, RN      Electronically signed by Alonza Bogus, RN at 12/22/2022 10:22 PM EST

## 2022-12-22 NOTE — ED Notes (Signed)
Formatting of this note might be different from the original.  Bed: 44  Expected date:   Expected time:   Means of arrival:   Comments:  Rm 21    Gerrie Nordmann, RN  12/22/22 1404    Electronically signed by Gerrie Nordmann, RN at 12/22/2022  2:04 PM EST

## 2022-12-23 NOTE — Care Plan (Signed)
Formatting of this note might be different from the original.      Care plan updated 12/23/22, Lurline Hare, RN      Electronically signed by Lurline Hare, RN at 12/23/2022  8:42 AM EST

## 2022-12-23 NOTE — Progress Notes (Signed)
Formatting of this note is different from the original.  Images from the original note were not included.  PROGRESS NOTE  Assessment/Plan   The patient was interviewed and examined by me. The patient was discussed with the PGY team.  See the PGY note for further details.   Chest pain negative tropes x2-suspect musculoskeletal, consider sickle cell  Hypoxia temporary Desat without oxygen but saturating fine with supplemental oxygen 96%  Sickle cell anemia-  hemoglobin 8.4-repeat pending  Retic 11.6  Bili 1.9/AST 40/ALT 10  Hydroxyurea  Dilaudid - try to minimize  History of DVT/SVC thrombus-continue Xarelto, no DVT on Doppler  Mood-Prozac 40  Keppra  AKI-8.9/1.4 -12.8/1.78  Human rhino virus- positive  -Unremarkable labs/workup-strokes, CT chest without pulmonary embolism or acute pulmonary disease but she does have a bandage catheter in the SVC,  respiratory panel  -FEN--regular, folate, LR at 100  -Soc observation, full code  -Prophy continue home Xarelto    Length of Stay: 0 days  Problem List       1. * (Principal) Sickle cell pain crisis (CMS/HCC)    2. Seizure disorder (CMS/HCC)    3. Superior vena cava occlusion (CMS/HCC)    4. Depression    5. Thrombocythemia    6. History of deep venous thrombosis    7. Migraine without aura and without status migrainosus, not intractable    8. History of pulmonary embolism    9. Pulmonary nodule       Subjective   She was having significant pain this morning, got to a Dilaudid and now she very sedated.  Nurses closely watching her O2 sats.  In the future we will give her smaller doses more frequently if needed      Objective   Physical Exam  Constitutional:       General: She is not in acute distress.     Appearance: She is well-developed and well-nourished.   HENT:      Head: Normocephalic and atraumatic.   Eyes:      Pupils: Pupils are equal, round, and reactive to light.   Cardiovascular:      Rate and Rhythm: Normal rate and regular rhythm.   Pulmonary:      Effort:  Pulmonary effort is normal.      Breath sounds: Normal breath sounds.   Abdominal:      Palpations: Abdomen is soft.   Skin:     General: Skin is warm and dry.   Neurological:      Mental Status: She is alert and oriented to person, place, and time.   Psychiatric:         Mood and Affect: Mood and affect normal.     Intake/Output Summary (Last 24 hours) at 12/23/2022 0738  Last data filed at 12/23/2022 0225  Intake 996.67 ml   Output --   Net 996.67 ml     Vital Sign Statistics Last 24 Hours (last 24 hours)         Average Min Max    Temp 98.1 F (36.7 C) 97.9 F (36.6 C) 98.3 F (36.8 C)    Pulse 91.79 80 115    Resp 18 16 20     BP: Systolic 161.09 604 540    BP: Diastolic 98.11 64 88    SpO2 95.71 % 90 % 100 %    Weight 79.8 kg (176 lb) 79.8 kg (176 lb) 79.8 kg (176 lb)         Lab Results  Component Value Date    SODIUM 133 (L) 12/23/2022    SODIUM 133 (L) 12/23/2022    POTASSIUM 4.5 12/23/2022    POTASSIUM 4.5 12/23/2022    CO2 14 (L) 12/23/2022    CO2 14 (L) 12/23/2022    CHLORIDE 101 12/23/2022    CHLORIDE 101 12/23/2022    BUN 8.9 12/23/2022    BUN 8.9 12/23/2022    CREATININE 1.48 (H) 12/23/2022    CREATININE 1.48 (H) 12/23/2022    GLUCOSE 392 (H) 12/23/2022    GLUCOSE 392 (H) 12/23/2022    CA 8.5 (L) 12/23/2022    CA 8.5 (L) 12/23/2022    MAGNESIUM 1.9 12/22/2022    ANIONGAP 19 (H) 12/23/2022    ANIONGAP 19 (H) 12/23/2022     Lab Results   Component Value Date    WBC 6.12 12/22/2022    RBC 2.09 (L) 12/22/2022    RBC 2.09 (L) 12/22/2022    HCT 23.6 (L) 12/22/2022    HGB 8.4 (L) 12/22/2022    PLT 353 12/22/2022     Lab Results   Component Value Date    PT 19.1 (H) 04/15/2020    INR 1.7 (H) 04/15/2020    PTT 25.1 (L) 02/16/2020    DDIMER 234 (H) 04/16/2020     Lab Results   Component Value Date    TROPONIN <6 05/24/2018    TROPONINT 7 12/22/2022     No results found for: "PHABG", "PHART", "PCO2ABG", "PO2ABG", "HCO3ABG", "HCO3ABC", "HCO3", "BEART", "BEABG", "SO2", "SO2ABG", "FIO2",  "FIO2ABG"      Electronically signed by Rivka Barbara, MD at 12/23/2022 11:40 AM EST

## 2022-12-23 NOTE — Progress Notes (Signed)
Formatting of this note is different from the original.  PROGRESS NOTE    Length of Stay: 0 days  12/22/2022  Problem List       1. * (Principal) Sickle cell pain crisis (CMS/HCC)    2. Seizure disorder (CMS/HCC)    3. Superior vena cava occlusion (CMS/HCC)    4. Depression    5. Thrombocythemia    6. History of deep venous thrombosis    7. Migraine without aura and without status migrainosus, not intractable    8. History of pulmonary embolism    9. Pulmonary nodule       Subjective   Pt continues to be in significant pain across entire body. Exam occurred as nurse administered dose of Dilaudid. Similar to pain yesterday. C/w pain crises in the past. No other complaints. No concerns per nursing.       Objective     Physical Exam  General:  Alert,  well-appearing, in no apparent distress, afebrile  Cardiovascular:  Regular rate and rhythm  Pulmonary:  CTA  Abdominal:  Soft, nontender to palpation  Skin:  No rashes appreciated, warm and dry    Intake/Output Summary (Last 24 hours) at 12/23/2022 0757  Last data filed at 12/23/2022 0225  Intake 996.67 ml   Output --   Net 996.67 ml     Vitals:    12/22/22 1701 12/22/22 1710 12/22/22 2253 12/23/22 0734   BP: 119/81  122/74 126/82   BP Location:    Left arm   Patient Position:    Lying   Pulse: 83  98 (!) 115   Resp:  20 18 16    Temp: 97.9 F (36.6 C)  98.3 F (36.8 C)    TempSrc: Oral      SpO2: 98%  92% 90%   Weight:  79.8 kg (176 lb)     Height:  5\' 8"  (1.727 m)       Lab Results in Last 24 Hours   Component Value Date/Time    GLUCOSE 392 (H) 12/23/2022 0504    GLUCOSE 392 (H) 12/23/2022 0504    SODIUM 133 (L) 12/23/2022 0504    SODIUM 133 (L) 12/23/2022 0504    POTASSIUM 4.5 12/23/2022 0504    POTASSIUM 4.5 12/23/2022 0504    CHLORIDE 101 12/23/2022 0504    CHLORIDE 101 12/23/2022 0504    CO2 14 (L) 12/23/2022 0504    CO2 14 (L) 12/23/2022 0504    BUN 8.9 12/23/2022 0504    BUN 8.9 12/23/2022 0504    CREATININE 1.48 (H) 12/23/2022 0504    CREATININE 1.48 (H)  12/23/2022 0504    CA 8.5 (L) 12/23/2022 0504    CA 8.5 (L) 12/23/2022 0504       Assessment/Plan   Impression:  47 y.o F w/ PMH of SCD presenting w/ acute pain crisis. CXR negative and CTA negative for embolus but significant for abandoned catheter segment.  Pt noted to have significant desaturations in ED. Reassuring on physical exam w/ nl vitals on 2 L NC O2.     Plan:    Acute Pain Crisis:  Sx c/w sickle cell pain crisis. Does have history of same. Received 1 L NS bolus in ED. Will add 100 ml/hr NS over next 20 hours.   Daily CMP and CBC.  Continue Dilaudid for pain. Wean as able. Will start at 2 ml q2 hr PRN.   Continue home folic acid and hydroxyurea.   Naloxone added PRN.  Zofran PRN.   Based on pt exam this AM, will need to continue dosing at 2 ml q2hr PRN for now.     Shortness of breath:  Unknown etiology. Ruled out acute chest syndrome w/ Chest XR. PE ruled out w/ CTA. Of note, CTA did show abandoned catheter segment in SVC. Noted to desat in ED while sleeping. Snoring respirations noted. Will order CPAP machine PRN for tonight. Sleep study on DC to evaluate for OSA. Maintain sats greater than 90 w/ NC O2. Comfortable on exam w/ nl O2 sats. Icentive spirometer. Will add breathing treatments as necessary. Pt satting >95% on exam on 2 L NC O2 and breathing comfortably. CTM.  RVP returns positive for rhino/enterovirus.     Seizures:  H/o seizures. Last in 2018. 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 Continue home Xarelto. Pt does currently have assymmetric lower extremities, right larger than left. Pt reports this is chronic. Reassuringly, good pulses, no paresthesias or paralysis. Evaluate for any changes w/ bilateral venous dopplers.   No DVT noted on Venous Dopplers.     Anxiety:   Home Prozac added.    Aron Baba, MD  12/23/2022      FEN: Regular diet, 1 L bolus,   Lines/Foley: PIV  PPX: Xarelto  Code: Full  Dispo: Admit to obs, final dispo pending improvement of presumed SCD pain  crisis        Electronically signed by Aron Baba, MD at 12/23/2022  8:05 AM EST

## 2022-12-23 NOTE — Care Plan (Signed)
Formatting of this note might be different from the original.      Care plan updated 12/23/22, Alonza Bogus, RN      Electronically signed by Alonza Bogus, RN at 12/23/2022  7:54 PM EST

## 2022-12-23 NOTE — Progress Notes (Signed)
Formatting of this note might be different from the original.  [facility] Teaching Service  Resident Backup Progress Note    Tanisha Lutes  was seen and examined at the bedside. New lab data, vital signs, and imaging were reviewed. The active orders were reviewed as well.   Please review the primary resident's note for more details.    Briefly,  This is a 47 y.o. 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 Still requiring dilaudid  Today, patient had just received a dose of dilaudid so was sleepy but easily awoken.    Brief updates to plan:  - Pain crisis: space pain medication as tolerated , continue home medications  - Recurrent DVT: Continue Xarelto  - Continue Keppra  - SVC thrombus is stable  - LUL pulm nodule: needs follow up in pulm nodule clinic, repeat imaging indicated in June 2024    The patient needs continued care in the hospital because:  IV pain control    See Primary Resident's Progress Note for further details    Please page Primary Resident on Call. Refer to Web on Call.     Noralee Stain, DO  PGY-3 Family Medicine Resident  12/23/2022    Portions of this note may have been dictated with voice recognition software.  This can result in confusion due to grammatical and/or word substitution errors.    Please contact me with any questions or need for clarification.     Electronically signed by Noralee Stain, DO at 12/23/2022  8:12 AM EST

## 2022-12-24 NOTE — Care Plan (Signed)
Formatting of this note might be different from the original.    Problem: Risk for Bleeding:  Goal: Monitor for Signs of Bleeding  Outcome: Progressing    Problem: Activity:  Goal: Risk for activity intolerance will decrease  Outcome: Progressing    Problem: Cognitive:  Goal: Knowledge of disease or condition will improve  Outcome: Progressing  Goal: Knowledge of safety precautions will improve  Outcome: Progressing  Goal: Knowledge of the prescribed therapeutic regimen will improve  Outcome: Progressing  Goal: Patients ability to demonstrate healthy behavior will be supported  Outcome: Progressing    Problem: Safety:  Goal: Ability to remain free from injury will improve  Outcome: Progressing  Goal: Will remain free from falls  Outcome: Progressing    Problem: Pain:  Goal: Pain level will decrease  Outcome: Progressing  Goal: Ability to develop a pain control plan will improve  Outcome: Progressing    Problem: Skin Integrity:  Goal: Risk for impaired skin integrity will decrease  Outcome: Progressing    Problem: VTE Prophylaxis:  Goal: Will show no signs or symptoms of venous thromboembolism  Outcome: Progressing    Problem: Safety:  Goal: Ability to identify and utilize support systems that promote safety will improve  Outcome: Progressing  Goal: Will remain free from falls  Outcome: Progressing    Problem: Activity:  Goal: Ability to avoid complications of mobility impairment will improve  Outcome: Progressing    Problem: Cognitive:  Goal: Knowledge of the prescribed therapeutic regimen will improve  Outcome: Progressing  Goal: Ability to demonstrate proper wound care will improve  Outcome: Progressing    Problem: Health Behavior:  Goal: Identification of resources available to assist in meeting health care needs will improve  Outcome: Progressing    Problem: Nutritional:  Goal: Nutritional status will improve  Outcome: Progressing    Problem: Skin Integrity:  Goal: Signs of wound healing will improve  Outcome:  Progressing  Goal: Skin integrity will improve  Outcome: Progressing  Goal: Will show no evidence of further tissue damage  Outcome: Progressing      Care plan updated 12/24/22, Arelia Sneddon, RN      Electronically signed by Arelia Sneddon, RN at 12/24/2022  9:10 PM EST

## 2022-12-24 NOTE — Progress Notes (Signed)
Formatting of this note is different from the original.  Images from the original note were not included.  PROGRESS NOTE  Assessment/Plan   The patient was interviewed and examined by me. The patient was discussed with the PGY team.  See the PGY note for further details.   Chest pain negative tropes x2-suspect musculoskeletal,  Hypoxia temporary Desat without oxygen but saturating fine with supplemental oxygen 96%  Sickle cell anemia-  hemoglobin 8.4- 7.5-6.5->  Transfusion-1 unit - recheck  Retic 11.6  Bili 1.9/AST 40/ALT 10  Hydroxyurea  Dilaudid ->po - tramadol 50  History of DVT/SVC thrombus- Xarelto, no DVT on Doppler  Mood-Prozac 40  Keppra  AKI-8.9/1.4 -12.8/1.78  Human rhino virus- positive  -Unremarkable labs/workup-strokes, CT chest without pulmonary embolism or acute pulmonary disease but she does have a bandage catheter in the SVC,  respiratory panel  -FEN--regular, folate, LR at 100  -Soc observation, full code  -Prophy continue home Xarelto    Length of Stay: 0 days  Problem List       1. * (Principal) Sickle cell pain crisis (CMS/HCC)    2. Seizure disorder (CMS/HCC)    3. Superior vena cava occlusion (CMS/HCC)    4. Depression    5. Thrombocythemia    6. History of deep venous thrombosis    7. Migraine without aura and without status migrainosus, not intractable    8. History of pulmonary embolism    9. Pulmonary nodule       Subjective   Slowly better, no new co, still with some pain to left upper ax area      Objective   Physical Exam  Constitutional:       General: She is not in acute distress.     Appearance: She is well-developed and well-nourished.   HENT:      Head: Normocephalic and atraumatic.   Eyes:      Pupils: Pupils are equal, round, and reactive to light.   Cardiovascular:      Rate and Rhythm: Normal rate and regular rhythm.      Heart sounds: No murmur heard.  Pulmonary:      Effort: Pulmonary effort is normal.      Breath sounds: Normal breath sounds.   Abdominal:      Palpations:  Abdomen is soft.   Skin:     General: Skin is warm and dry.   Neurological:      Mental Status: She is alert and oriented to person, place, and time.   Psychiatric:         Mood and Affect: Mood and affect normal.     Intake/Output Summary (Last 24 hours) at 12/24/2022 0747  Last data filed at 12/24/2022 0500  Intake 1610 ml   Output 1550 ml   Net 60 ml     Vital Sign Statistics Last 24 Hours (last 24 hours)         Average Min Max    Temp 98.2 F (36.8 C) 98.2 F (36.8 C) 98.2 F (36.8 C)    Pulse 104.5 102 107    Resp 15 14 16     BP: Systolic 315.40 97 086    BP: Diastolic 76.19 59 78    SpO2 95.33 % 91 % 98 %    Weight 77.4 kg (170 lb 10.2 oz) 77.4 kg (170 lb 10.2 oz) 77.4 kg (170 lb 10.2 oz)         Lab Results   Component Value Date  SODIUM 137 12/23/2022    POTASSIUM 5.1 12/23/2022    CO2 19 (L) 12/23/2022    CHLORIDE 104 12/23/2022    BUN 12.8 12/23/2022    CREATININE 1.78 (H) 12/23/2022    GLUCOSE 168 (H) 12/23/2022    CA 8.9 12/23/2022    MAGNESIUM 1.9 12/22/2022    ANIONGAP 14 12/23/2022     Lab Results   Component Value Date    WBC 10.61 12/23/2022    RBC 1.86 (L) 12/23/2022    RBC 1.86 (L) 12/23/2022    HCT 21.7 (L) 12/23/2022    HGB 7.5 (L) 12/23/2022    PLT 314 12/23/2022     Lab Results   Component Value Date    PT 19.1 (H) 04/15/2020    INR 1.7 (H) 04/15/2020    PTT 25.1 (L) 02/16/2020    DDIMER 234 (H) 04/16/2020     Lab Results   Component Value Date    TROPONIN <6 05/24/2018    TROPONINT 7 12/22/2022     No results found for: "PHABG", "PHART", "PCO2ABG", "PO2ABG", "HCO3ABG", "HCO3ABC", "HCO3", "BEART", "BEABG", "SO2", "SO2ABG", "FIO2", "FIO2ABG"      Electronically signed by Rivka Barbara, MD at 12/24/2022 11:30 AM EST

## 2022-12-24 NOTE — Progress Notes (Signed)
Formatting of this note is different from the original.  PROGRESS NOTE    Length of Stay: 0 days  12/22/2022  Problem List       1. * (Principal) Sickle cell pain crisis (CMS/HCC)    2. Seizure disorder (CMS/HCC)    3. Superior vena cava occlusion (CMS/HCC)    4. Depression    5. Thrombocythemia    6. History of deep venous thrombosis    7. Migraine without aura and without status migrainosus, not intractable    8. History of pulmonary embolism    9. Pulmonary nodule       Subjective   [name] Bright was awake and sitting at bedside upon examination. Patient reports pain overall improved but continues in upper extremities, primarily in shoulders.        Objective   Physical Exam:   Physical Exam   Constitutional: No distress. She appears acutely ill.   Eyes: Conjunctivae are normal.   Cardiovascular: Normal rate, regular rhythm, S1 normal, S2 normal and normal heart sounds.   Pulmonary/Chest: Effort normal and breath sounds normal.   Abdominal: Soft. Bowel sounds are normal. She exhibits no distension and no mass. There is no splenomegaly or hepatomegaly. There is no abdominal tenderness.   Musculoskeletal:         General: Tenderness present. No deformity or edema.      Comments: Bilateral UE tenderness in arm and shoulder    Neurological: She is alert and oriented to person, place, and time.   Skin: Skin is warm. No cyanosis. No jaundice.     Intake/Output Summary (Last 24 hours) at 12/24/2022 1610  Last data filed at 12/24/2022 0500  Intake 1610 ml   Output 1550 ml   Net 60 ml     Vitals:    12/23/22 1439 12/23/22 2000 12/23/22 2341 12/24/22 0500   BP: 122/78  (!) 97/59    BP Location: Left arm  Left arm    Patient Position: Lying  Lying    Pulse: (!) 107  (!) 102    Resp: 14  16    Temp:   98.2 F (36.8 C)    TempSrc:   Oral    SpO2: 98% 97% 91%    Weight:    77.4 kg (170 lb 10.2 oz)   Height:         Results from last 7 days   Lab Units 12/23/22  1904 12/22/22  0412   WBC K/microL 10.61 6.12   HGB g/dL  7.5* 8.4*   HCT % 21.7* 23.6*   PLT K/microL 314 353     Results from last 7 days   Lab Units 12/23/22  0949 12/23/22  0504 12/22/22  1830   SODIUM mmol/L 137 133*  133* 140   POTASSIUM mmol/L 5.1 4.5  4.5 4.3   CHLORIDE mmol/L 104 101  101 106   CARBON DIOXIDE mmol/L 19* 14*  14* 23   BUN mg/dL 12.8 8.9  8.9 6.2   CREATININE mg/dL 1.78* 1.48*  1.48* 0.89   CALCIUM mg/dL 8.9 8.5*  8.5* 9.2   GLUCOSE mg/dL 168* 392*  392* 144*   ANION GAP mmol/L 14 19*  19* 11   BCRATIO  7 6  6 7      Results from last 7 days   Lab Units 12/23/22  0949 12/23/22  0504   ALKALINE PHOSPHATASE U/L 104 88   BILIRUBIN, TOTAL mg/dL 2.7* 1.9*   TOTAL PROTEIN g/dL  8.1 7.1   ALBUMIN g/dL 4.3 3.7*   ALT U/L 18 10   AST U/L 60* 40*       Assessment/Plan   Problem List       1. * (Principal) Sickle cell pain crisis (CMS/HCC)    2. Seizure disorder (CMS/HCC)    3. Superior vena cava occlusion (CMS/HCC)    4. Depression    5. Thrombocythemia    6. History of deep venous thrombosis    7. Migraine without aura and without status migrainosus, not intractable    8. History of pulmonary embolism    9. Pulmonary nodule     IMPRESSION  [name] Bright is a 47 yo female presenting with a PMH significant for sickle cell disease presenting with an acute pain crisis.  Patient with AKI and acute anemia requiring transfusions.      PLAN  Acute Pain Crisis  - Pain improved, switch to PO tramadol 50 mg q8hr PRN  - Continue home folic acid and hydroxyurea.   - Zofran PRN  - Hgb down to 6.5; will prep 2 units RBC, transfuse 1   - repeat H/H 2 hrs post transfusion     AKI  Baseline Cr 0.8, increased to 1.72   - continue IV fluids LR 100 ml/hr   - Encourage PO intake   - continue to monitor     Shortness of breath  CXR, CTPE with no acute etiology.  RVP positive for rhino virus.  Also concerns for OSA   - CPAP while sleeping   - Keep O2 greater than 90%   - Follow up with out patient sleep study     Seizures  H/o seizures. Last in 2018.   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   - Continue home Xarelto.   - No DVT noted on Venous Dopplers.     Anxiety   Home Prozac added.    FEN: Full diet   Lines/Foley: PIV  PPX: Xarelto 20 mg daily   Code: Full   Dispo: Continue as IP, weaning pain medication     Basil Dess, DO  12/24/22      Electronically signed by Basil Dess, DO at 12/24/2022 12:15 PM EST

## 2022-12-24 NOTE — Progress Notes (Signed)
Formatting of this note might be different from the original.  [facility] Adult Teaching Service  Resident Backup Progress Note    [name] Bright  was seen and examined at the bedside. New lab data, vital signs, and imaging were reviewed. The active orders were reviewed as well.   Please review the primary resident's note for more details.    Briefly,  This is a 47 y.o. 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  She is able to space her opioids.  Feel she could likely transition  Today, she states that her pain is better.  Has required less dilaudid overall    Brief updates to plan:  - Pain crisis: space pain medication as tolerated and can likely transition to oral opioids today (consider something like roxicodone 2.5mg  for a few days to help with transition), continue home medications  - Recurrent DVT: Continue Xarelto  - Continue Keppra  - SVC thrombus is stable  - LUL pulm nodule: needs follow up in pulm nodule clinic, repeat imaging indicated in June 2024  - elevated creatinine yesterday, could be secondary to sickle cell versus dehydration.  Fluids overnight, follow up BMP this morning     The patient needs continued care in the hospital because:  IV pain control      See Primary Resident's Progress Note for further details    Please page Primary Resident on Call. Refer to Web on Call.     Noralee Stain, DO  PGY-3 Family Medicine Resident  12/24/2022    Portions of this note may have been dictated with voice recognition software.  This can result in confusion due to grammatical and/or word substitution errors.    Please contact me with any questions or need for clarification.     Electronically signed by Noralee Stain, DO at 12/24/2022  8:15 AM EST

## 2022-12-25 MED ORDER — HYDROXYUREA 500 MG PO CAPS
500 MG | ORAL_CAPSULE | Freq: Every day | ORAL | 3 refills | Status: DC
Start: 2022-12-25 — End: 2023-06-13

## 2022-12-25 NOTE — Progress Notes (Signed)
Formatting of this note is different from the original.  Images from the original note were not included.  PROGRESS NOTE  Assessment/Plan   The patient was interviewed and examined by me. The patient was discussed with the PGY team.  See the PGY note for further details.   Chest pain negative tropes x2-suspect musculoskeletal,  Hypoxia temporary Desat without oxygen but saturating fine with supplemental oxygen 96%  Sickle cell anemia-  hemoglobin 8.4- 7.5-6.5->  Transfusion-1 unit -7.4   Retic 11.6  Bili 1.9/AST 40/ALT 10  Hydroxyurea  Dilaudid ->po  - try to wean  Hypoxia - on 1 L here, home without O2 - will wean off  History of DVT/SVC thrombus- Xarelto, no DVT on Doppler  Mood-Prozac 40  Keppra  AKI-8.9/1.4 -12.8/1.78-15/1.23  Human rhino virus- positive  -Unremarkable labs/workup-strokes, CT chest without pulmonary embolism or acute pulmonary disease but she does have a bandage catheter in the SVC,  respiratory panel  -FEN--regular, folate, LR at 100->50, stop tonight if imprved  -Soc observation, full code  -Prophy continue home Xarelto    Length of Stay: 1 day  Problem List       1. * (Principal) Sickle cell pain crisis (CMS/HCC)    2. Seizure disorder (CMS/HCC)    3. Superior vena cava occlusion (CMS/HCC)    4. Depression    5. Thrombocythemia    6. AKI (acute kidney injury) (CMS/HCC)    7. Anemia    8. History of deep venous thrombosis    9. Migraine without aura and without status migrainosus, not intractable    10. History of pulmonary embolism    11. Pulmonary nodule       Subjective   Feeling some better, less SOB, pain is decreased some but not gone      Objective   Physical Exam  Constitutional:       General: She is not in acute distress.     Appearance: She is well-developed and well-nourished.   HENT:      Head: Normocephalic and atraumatic.   Eyes:      Pupils: Pupils are equal, round, and reactive to light.   Cardiovascular:      Rate and Rhythm: Normal rate and regular rhythm.      Heart sounds:  No murmur heard.  Pulmonary:      Effort: Pulmonary effort is normal.      Breath sounds: Normal breath sounds.   Abdominal:      Palpations: Abdomen is soft.   Musculoskeletal:         General: No edema.   Skin:     General: Skin is warm and dry.   Neurological:      Mental Status: She is alert and oriented to person, place, and time.   Psychiatric:         Mood and Affect: Mood and affect normal.     Intake/Output Summary (Last 24 hours) at 12/25/2022 0724  Last data filed at 12/25/2022 0401  Intake 1468 ml   Output --   Net 1468 ml     Vital Sign Statistics Last 24 Hours (last 24 hours)         Average Min Max    Temp 98.66 F (37 C) 97.5 F (36.4 C) 99.7 F (37.6 C)    Pulse 98.3 85 104    Resp 16.13 15 18     BP: Systolic 38.$BOFBPZWCHENIDPOE_[id number]$$[id number]_BHALPFXTKWIOXBDZHGDJMEQASTMHDQQI$ 297.98 921    BP: Diastolic 194 63 87  SpO2 93.86 % 85 % 98 %         Lab Results   Component Value Date    SODIUM 142 12/25/2022    POTASSIUM 3.8 12/25/2022    CO2 24 12/25/2022    CHLORIDE 107 12/25/2022    BUN 15.4 12/25/2022    CREATININE 1.23 (H) 12/25/2022    GLUCOSE 107 (H) 12/25/2022    CA 9.1 12/25/2022    MAGNESIUM 1.9 12/22/2022    ANIONGAP 11 12/25/2022     Lab Results   Component Value Date    WBC 8.37 12/24/2022    RBC 1.63 (L) 12/24/2022    RBC 1.63 (L) 12/24/2022    HCT 21.1 (L) 12/24/2022    HGB 7.6 (L) 12/24/2022    PLT 310 12/24/2022     Lab Results   Component Value Date    PT 19.1 (H) 04/15/2020    INR 1.7 (H) 04/15/2020    PTT 25.1 (L) 02/16/2020    DDIMER 234 (H) 04/16/2020     Lab Results   Component Value Date    TROPONIN <6 05/24/2018    TROPONINT 7 12/22/2022     No results found for: "PHABG", "PHART", "PCO2ABG", "PO2ABG", "HCO3ABG", "HCO3ABC", "HCO3", "BEART", "BEABG", "SO2", "SO2ABG", "FIO2", "FIO2ABG"      Electronically signed by Rivka Barbara, MD at 12/25/2022 11:35 AM EST

## 2022-12-25 NOTE — Progress Notes (Signed)
Formatting of this note might be different from the original.  [facility] Adult Teaching Service  Resident Backup Progress Note    [name] Bright was seen and examined at the bedside. New lab data, vital signs, and imaging were reviewed. The active orders were reviewed as well.     Briefly,  47 y.o. 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     Significant results in the interim:  Hgb 7.4, stable since 1u RBC's on (1/21)  Creatinine 1.23, improving    Rhino/Enterovirus+    Brief updates to plan:    #Sickle Cell Pain Crisis  Presentation similar to prior crises. Not on daily opioids outpatient. Managed by Dr. Floyde Parkins in [place] as tolerated   -Continue home hydroxyurea and folate  -IV fluids  -Supplemental O2 PRN to maintain sats>92%  -Transfusion threshold Hgb <7 or symptomatic anemia    #Anemia  S/p 1u RBC's on (1/21).  -Transfusion threshold Hgb <7 or symptomatic anemia    #AKI (Improving)  Creatinine 1.78 on (1/20) from baseline <1.  -Improving with IV fluids    #History of Recurrent DVT's  Known chronic LLE DVT. Bilateral LE venous duplex negative for DVT on (1/19).  -Continue home Xarelto    #SVC Thrombus  Unchanged on imaging in the ED with extensive collateralization throughout the chest wall. Evaluated by Vascular Surgery in 2019, who did not recommend intervention.     #LUL Pulmonary Nodule  Noted incidentally during hospitalization in June 2023. Due for repeat imaging in June 2024.  -Follow-up outpatient in [facility]    The patient needs continued care in the hospital because:  IV pain meds, AKI    Code: Full code  Lines/Foley: peripheral IV  Fluids: LR @ 100 ml/hr  Ppx: Xarelto  Dispo: Admitted to Gen Med floor, dispo pending normalized renal function and pain control on PO meds     See Primary Resident's Progress Note for further details and management  of chronic conditions.     Please page Resident on Call. Refer to Web on Call.     [name], MD  PGY-3 Family Medicine Resident  12/24/2022   Electronically signed by [name], MD at 12/25/2022  8:04 AM EST

## 2022-12-25 NOTE — Progress Notes (Signed)
Formatting of this note is different from the original.     Care Coordination Note     Patient: Solomiya Pascale  DOB: 02-Jan-1976  MRN: [id number]    Disposition: Home: Self Care   12/25/22 1305   Initial Assessment   Marital Status single   Patient lives (with): parent/s   ADL Assessment independent   Patient's living accomodations: home   Financial Status Medicare;Medicaid  Personnel officer Medicare/Medicaid)   Current Services Primary Care Physician  Quay Burow)   Discharge: proposed/alternative discharge plan (comment)  (Home/Outpatient f/u)     Spoke with pt to complete assessment. Pt lives at home with her mother and father. Pt is independent with care. Pt father transports to appointments. Pt has insurance and can afford medications. Pt has a PCP. Pt does not use DME. Pt plans to return home at discharge. Will continue to follow.    Electronically signed by Vassie Moment, LMSW at 12/25/2022  1:11 PM EST

## 2022-12-25 NOTE — Care Plan (Signed)
Formatting of this note might be different from the original.    Problem: Risk for Bleeding:  Goal: Monitor for Signs of Bleeding  Outcome: Progressing    Problem: Activity:  Goal: Risk for activity intolerance will decrease  Outcome: Progressing    Problem: Cognitive:  Goal: Knowledge of disease or condition will improve  Outcome: Progressing  Goal: Knowledge of safety precautions will improve  Outcome: Progressing  Goal: Knowledge of the prescribed therapeutic regimen will improve  Outcome: Progressing  Goal: Patients ability to demonstrate healthy behavior will be supported  Outcome: Progressing    Problem: Safety:  Goal: Will remain free from falls  Outcome: Progressing    Problem: Safety:  Goal: Ability to remain free from injury will improve  Outcome: Progressing    Problem: Skin Integrity:  Goal: Risk for impaired skin integrity will decrease  Outcome: Progressing    Problem: VTE Prophylaxis:  Goal: Will show no signs or symptoms of venous thromboembolism  Outcome: Progressing    Problem: Safety:  Goal: Ability to identify and utilize support systems that promote safety will improve  Outcome: Progressing  Goal: Will remain free from falls  Outcome: Progressing    Problem: Activity:  Goal: Ability to avoid complications of mobility impairment will improve  Outcome: Progressing    Problem: Cognitive:  Goal: Knowledge of the prescribed therapeutic regimen will improve  Outcome: Progressing  Goal: Ability to demonstrate proper wound care will improve  Outcome: Progressing    Problem: Health Behavior:  Goal: Identification of resources available to assist in meeting health care needs will improve  Outcome: Progressing    Problem: Nutritional:  Goal: Nutritional status will improve  Outcome: Progressing    Problem: Skin Integrity:  Goal: Signs of wound healing will improve  Outcome: Progressing  Goal: Skin integrity will improve  Outcome: Progressing  Goal: Will show no evidence of further tissue damage  Outcome:  Progressing      Care plan updated 12/25/22, Arelia Sneddon, RN      Electronically signed by Arelia Sneddon, RN at 12/25/2022  7:55 PM EST

## 2022-12-25 NOTE — Progress Notes (Signed)
Formatting of this note is different from the original.  PROGRESS NOTE    Length of Stay: 1 day  12/22/2022  Problem List       1. * (Principal) Sickle cell pain crisis (CMS/HCC)    2. Seizure disorder (CMS/HCC)    3. Superior vena cava occlusion (CMS/HCC)    4. Depression    5. Thrombocythemia    6. AKI (acute kidney injury) (CMS/HCC)    7. Anemia    8. History of deep venous thrombosis    9. Migraine without aura and without status migrainosus, not intractable    10. History of pulmonary embolism    11. Pulmonary nodule       Subjective   [name] Bright reports she is slightly improved this morning.  She continues to have severe pain in her left and right upper quadrants, tolerating with current pain regimen.  She did not eat yesterday and only drank 3 cups of water.  We discussed increasing her PO intake, especially fluids today.  Patient continues on 1L oxygen via nasal canula       Objective   Physical Exam:   Physical Exam   Constitutional: She appears acutely ill.   Eyes: Conjunctivae are normal.   Pulmonary/Chest: Effort normal and breath sounds normal. She has no wheezes. She has no rales.   Abdominal: Soft. Bowel sounds are normal. She exhibits no distension. There is no splenomegaly or hepatomegaly. There is abdominal tenderness.   Exquisite pain in the left and right upper quadrant, no organomegaly noted     Musculoskeletal:         General: Tenderness present. No edema.      Comments: Tenderness to palpation of bilateral UE, primarily on shoulders.     Neurological: She is alert and oriented to person, place, and time.   Skin: Skin is warm. No cyanosis. No jaundice.     Intake/Output Summary (Last 24 hours) at 12/25/2022 0634  Last data filed at 12/25/2022 0401  Intake 1468 ml   Output --   Net 1468 ml     Vitals:    12/24/22 1545 12/24/22 1626 12/24/22 2036 12/25/22 0357   BP: 137/77 136/87 112/74 105/70   Pulse: (!) 102 (!) 104 97 97   Resp: 17  16 16    Temp: 99.4 F (37.4 C) 97.5 F (36.4  C) 99 F (37.2 C) 98.6 F (37 C)   TempSrc: Oral Oral Oral Oral   SpO2:  92% 95% 95%   Weight:       Height:         Results from last 7 days   Lab Units 12/24/22  1933 12/24/22  0906 12/23/22  1904 12/22/22  0412   WBC K/microL  --  8.37 10.61 6.12   HGB g/dL 7.6* 6.5* 7.5* 8.4*   HCT % 21.1* 17.9* 21.7* 23.6*   PLT K/microL  --  310 314 353     Results from last 7 days   Lab Units 12/24/22  0906 12/23/22  0949 12/23/22  0504   SODIUM mmol/L 137 137 133*  133*   POTASSIUM mmol/L 4.3 5.1 4.5  4.5   CHLORIDE mmol/L 103 104 101  101   CARBON DIOXIDE mmol/L 20* 19* 14*  14*   BUN mg/dL 18.7 12.8 8.9  8.9   CREATININE mg/dL 1.72* 1.78* 1.48*  1.48*   CALCIUM mg/dL 8.8 8.9 8.5*  8.5*   GLUCOSE mg/dL 110* 168* 392*  392*  ANION GAP mmol/L 13 14 19*  19*   BCRATIO  11 7 6  6      Results from last 7 days   Lab Units 12/24/22  0906 12/23/22  0949 12/23/22  0504   ALKALINE PHOSPHATASE U/L 83 104 88   BILIRUBIN, TOTAL mg/dL 1.6* 2.7* 1.9*   TOTAL PROTEIN g/dL 7.1 8.1 7.1   ALBUMIN g/dL 3.7* 4.3 3.7*   ALT U/L 17 18 10    AST U/L 57* 60* 40*       Assessment/Plan   Problem List       1. * (Principal) Sickle cell pain crisis (CMS/HCC)    2. Seizure disorder (CMS/HCC)    3. Superior vena cava occlusion (CMS/HCC)    4. Depression    5. Thrombocythemia    6. AKI (acute kidney injury) (CMS/HCC)    7. Anemia    8. History of deep venous thrombosis    9. Migraine without aura and without status migrainosus, not intractable    10. History of pulmonary embolism    11. Pulmonary nodule     IMPRESSION  [name] Bright is a 47 yo female presenting with a PMH significant for sickle cell disease presenting with an acute pain crisis.  Patient with AKI and acute anemia, s/p 1u RBC transfusion.      PLAN  Acute Pain Crisis  - Transition pain med to Norco 10 q4 hr PRN    - Continue home folic acid and hydroxyurea.   - Zofran PRN  - s/p 1u RBC, Hgb improved to 7.6, stable   - continue to monitor with daily CBC      AKI  Baseline Cr 0.8, increased to 1.72, improved to 1.2   - wean IV fluids to 50 ml/hr   - repeat BMP this PM  - Encourage PO intake; plan to stop IV fluids if adequate PO and resolution of AKI   - continue to monitor     Shortness of breath  CXR, CTPE with no acute etiology.  RVP positive for rhino virus, Hx concerning for OSA, may also be secondary to acute anemia   - CPAP while sleeping   - Wean oxygen as tolerated   - Follow up with out patient sleep study     Seizures  H/o seizures. Last in 2018.   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   - Continue home Xarelto.   - No DVT noted on Venous Dopplers.     Anxiety   Home Prozac added.    FEN: Full diet   Lines/Foley: PIV  PPX: Xarelto 20 mg daily   Code: Full   Dispo: Continue as IP, weaning pain medication     Basil Dess, DO  12/25/22      Electronically signed by Basil Dess, DO at 12/25/2022 11:10 AM EST

## 2022-12-25 NOTE — Progress Notes (Signed)
Formatting of this note is different from the original.  Images from the original note were not included.  Medical Diagnosis: sickle cell pain crisis (12/25/2022  3:00 PM)  Therapy Diagnosis: decreased endurance (12/25/2022  3:00 PM)     Physical Therapy Documentation - 12/25/22 1500         General Info    Type of Note Initial Eval & Discharge     Treatment Day # 1     Date POC Initiated 12/25/22     Room Number 485     Date First PT Assessed 12/25/22     Time First PT Assessed 1453       Treatment Time    Start Time 1453     Stop Time 1510     Total Treatment Time 17 min     Patient Class Inpatient       Order Info    Ordering Physician Dr. Gardiner Ramus     Order PT evaluate and treat;gait training     Referral Date 12/25/22       History    Medical and Surgical History Reviewed? done     Radiolgy Results Reviewed? done     Additional Pertinent Medical History hypokalemia, depression, GERD, migraines, osteoarthritis, seizures, sickle cell anemia, stroke     Tests/Procedures CTA chest: NO PULMONARY THROMBOEMBOLI IDENTIFIED; REDEMONSTRATION OF ABANDONED CATHETER SEGMENT WITHIN THE SVC WITH ASSOCIATED SEVERE FOCAL NARROWING VERSUS OCCLUSION AT THE INFERIOR ASPECT OF THE CATHETER RESULTING IN EXTENSIVE COLLATERALIZATION THROUGHOUT THE CHEST WALL (Evaluated by Vascular Surgery in 2019, who did not recommend intervention per MD note) // CXR: No acute cardiopulmonary process identified. // vas venous B LE: (-) // ECG: Normal sinus rhythm // hct 20.8     History of Present Illness pt is a 47 y/o F who presented to ED with c/o chest pain after helping her mother off of the floor     Medical Diagnosis sickle cell pain crisis     Therapy Diagnosis decreased endurance       Social History    Social History Single     Type of Residence independent living     Lives With Parents   mother      Home Environment    Type of [place] One level;Elevator   2nd floor apartment    Sleeping Sleeps in bed       Previous  Functional Status    Level of Independence Independent with ADLs;Independent with homemaking;Independent with ambulation;Drives     Receives Help From No help required at baseline     Home Equipment None     Pre-neurologic disability modified Rankin Scale score 0-the patient has no symptoms from neurologic injury     Additional Notes no recent falls       RUE Assessment    RUE Range of Motion Limitations     RUE Limitations limited overhead reach at baseline, reports she needs a shoulder replacement     RUE Gross Strength WFL     RUE Palpation grossly intact       LUE Assessment    LUE Range of Motion Within functional limits     LUE Gross Strength WFL     LUE Palpation grossly intact       RLE Assessment    RLE Range of Motion Within functional limits     RLE Hip Strength WFL     RLE Knee Strength [facility]  RLE Ankle Strength WFL     RLE Tone Within Functional Limits     RLE Palpation grossly intact       LLE Assessment    LLE Range of Motion Within functional limits   reports she needs a L hip replacement due to arthritis    LLE Hip Strength WFL     LLE Knee Strength WFL     LLE Ankle Strength WFL     LLE Tone Within Functional Limits     LLE Palpation grossly intact       Trunk Strength    Trunk Strength good       Motor Control    Motor Control WFL       Endurance    Endurance fair       Neuromuscular Status    Neuromuscular Status grossly intact     Neuromuscular Comments no focal neural deficits       Balance    Balance low fall risk       Sensation    Light Touch No apparent deficits       PT Impression    PT Impression no skilled PT needs identified       AM-PAC 6 Clicks    AM-PAC 6 Clicks Done     Rolling in bed None (Mod I/Independent)     Supine to sit None (Mod I/Independent)     Transferring bed to chair A little (min assist/CGA/supervision)     Sit to stand from chair A little (min assist/CGA/supervision)     Walking in room A little (min assist/CGA/supervision)     Climbing 3-5 steps with rail A little  (min assist/CGA/supervision)     Total 20       Eval Complexity    History 1-2 personal factors or comorbidities     Examination Standardized tests of 3 or more elements from body structures, functions, activity limitations, and/or participation     Presentation Stable, uncomplicated characteristics      Clinical-decision making Low complexity use of standardized patient assesment     Eval Complexity Low complexity       Therapy Assessment    Subjective may treat, per nursing   Donelle, RN    Cognitive Status alert;oriented x 4;follows commands     Precautions fall precautions;universal;aspiration;Reflux precaution;Day night precautions     Presentation bed;IV     O2 Mode room air     Current neurologic disability modified Rankin Scale score 0-the patient has no symptoms from neurologic injury     Current Status PT eval completed. Pt in bed on arrival. The pt was mod (I) for bed mobility and required SBA for functional mobility in room. Education provided on safe mobility in the home to reduce fall risk. HR 100-115 bpm, SpO2 89-90% - RN made aware. No acute PT needs as pt reports she is at her baseline. Will d/c from PT, recommend home at d/c       Treatment Provided    Treatment Provided gait training;transfer training;bed mobility;ther activity;safety;patient/family education;energy conservation       Bed Mobility Skills    Bed Mobility Skills 1       Bed Mobility Skill #1    Skill supine to sit;sit to supine;HOB elevated     Assistance modified independent       Transfer Skills    Transfer Skills 3       Transfer Skill #1    Skill sit to stand;from bed;stand to  sit;to bed     Assistance standby assist       Transfer Skill #2    Skill sit to stand;from bed;stand to sit;to commode     Assistance standby assist       Transfer Skill #3    Skill sit to stand;from commode;stand to sit;to bed     Assistance standby assist       Balance    Sitting - Static independent     Sitting - Dynamic independent     Sitting -  Additional Notes EOB     Standing - Static SBA     Standing - Dynamic SBA     Standing - Additional Notes SBA for safety and vital sign monitoring       Gait    Distance (ft) 50 feet     Surface level surface     Device none     Assistance SBA     Gait Deviations --   somewhat antalgic - reports she needs a L hip replacement due to arthritis    Cueing Provided For energy conservation;safety     Ambulating O2 Device none     Additional Notes see education in current status; also recommended use of pulse ox at home to monitor SpO2 and HR       Vital Signs - Before Treatment    Pulse 99 beats per minute       Vital Signs - During Treatment    Pulse 115 beats per minute     O2 Sat 89 %   89-90      Pain    Pain Location --   "L side"    Pain Intensity 8     Pain Interventions rest;premedicated by nurse       Bed Mobility Goals and Progress    Add New Goal Goal #1     #1 Goal: Bed Mobility patient will demonstrate;supine to sit;sit to supine;with standby assist;within 8 days;to improve independence with mobility     #1 Prog: Bed Mobility goal met       Transfers Goals and Progress    Add New Goal Goal #1     #1 Goal: Transfers patient will transfer;sit to stand;stand to sit;with standby assist;within 8 days;to safely rise from a seated position     #1 Prog: Transfers goal met       Balance Goals and Progress    Add New Goal Goal #1     #1 Goal: Balance patient will demonstrate;standing balance;with standby assist;for 5 minutes;within 8 days;to participate more fully in daily roles and tasks     #1 Prog: Balance goal met       Education    Education Topics educated;Response to education;Education provided;Method      Education provided Yes     Learners Patient     Method Verbal     Topics educated on: Risk, benefits, and role of physical therapy/importance of PT eval;POC;Gait training;Energy conservation     Estate agent;Energy conservation     Response to education Verbalized good understanding       PT  Prognosis, Recommendations, & Plan    Patient/Caregiver Input in POC agrees to [place]   agrees to d/c from PT    Location/Position After Treatment not appropriate for make it COUNT;bed;HOB elevated;essentials in reach;lines intact;bed rails up x 3;call button in hand;table in reach;left as found;nursing aware     Response to Care good     PT Discharge  Recommendations home     PT Plan discharge     PT Discharge Reason no further skilled PT need in this setting     Patient Discharged  To patient remains in hospital       Treatment Team Communication    Communication with  nurse     Topic OK to treat patient;patient status;patient goals/progress   SpO2 and HR    Additional Notes Donelle. RN       Untimed Therapy Charges    Evaluation-low 8     Untimed based total 8     Total Treatment Time 17       Timed Therapy Charges    Gait train 8     Time based total 8     Total Treatment Time 17                Inetta Fermo, PT      Electronically signed by Inetta Fermo, PT at 12/25/2022  3:27 PM EST

## 2022-12-25 NOTE — Care Plan (Signed)
Formatting of this note might be different from the original.      Care plan updated 12/25/22, Lurline Hare, RN      Electronically signed by Lurline Hare, RN at 12/25/2022  9:47 AM EST

## 2022-12-26 NOTE — Nursing Note (Signed)
Formatting of this note might be different from the original.  Discharge education completed using ask me 3 and teach back.   Electronically signed by Zara Council D, RN at 12/26/2022 12:59 PM EST

## 2022-12-26 NOTE — Progress Notes (Signed)
Formatting of this note is different from the original.     Care Coordination Note     Patient: [name] Bright  DOB: 10-Jun-1976  MRN: [id number]    Disposition: Home: Self Care   12/26/22 1253   Discharge Disposition   Discharge Disposition: Home: Self-care   Discharge Transportation Private Vehicle     Pt discharging home via private vehicle. Reviewed notes and orders, no further needs.     Electronically signed by Vassie Moment, LMSW at 12/26/2022  1:24 PM EST

## 2022-12-26 NOTE — Progress Notes (Signed)
Formatting of this note might be different from the original.  [facility] Adult Teaching Service  Resident Backup Progress Note    [name] Bright was seen and examined at the bedside. New lab data, vital signs, and imaging were reviewed. The active orders were reviewed as well.     Briefly,  47 y.o. 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     Significant results in the interim:  CBC pending, stable. S/p 1u RBC's on (1/21).  BMP pending    Rhino/Enterovirus+    Brief updates to plan:    #Sickle Cell Pain Crisis  Presentation similar to prior crises. Not on daily opioids outpatient. Managed by Dr. Floyde Parkins in [place].  -Pain well controlled with Norco, off IV pain meds  -Continue home hydroxyurea and folate  -Supplemental O2 PRN to maintain sats>92%    #Anemia  S/p 1u RBC's on (1/21).  -Transfusion threshold Hgb <7 or symptomatic anemia    #AKI (Improving)  Creatinine 1.78 on (1/20) from baseline <1.  -Improving with IV fluids    #History of Recurrent DVT's  Known chronic LLE DVT. Bilateral LE venous duplex negative for DVT on (1/19).  -Continue home Xarelto    #SVC Thrombus  Unchanged on imaging in the ED with extensive collateralization throughout the chest wall. Evaluated by Vascular Surgery in 2019, who did not recommend intervention.     #LUL Pulmonary Nodule  Noted incidentally during hospitalization in June 2023. Due for repeat imaging in June 2024.  -Follow-up outpatient in [facility]    The patient needs continued care in the hospital because:  Mobile home today    Code: Full code  Lines/Foley: peripheral IV  Fluids: None  Ppx: Xarelto  Dispo: Admitted to Gen Med floor, hopeful DC home today    See Primary Resident's Progress Note for further details and management of chronic conditions.     Please page Resident on Call. Refer to Web on Call.     [name],  MD  PGY-3 Family Medicine Resident  12/26/2022   Electronically signed by [name], MD at 12/26/2022  8:23 AM EST

## 2022-12-26 NOTE — Discharge Summary (Signed)
Formatting of this note is different from the original.  DISCHARGE SUMMARY  Primary Care Physician: Mardella Layman, MD  Primary Resident: [place] O'Steen, MD  Backup Resident:  Johny Blamer, MD  Attending physician: Scarlett Presto, MD     Admission Date: 12/22/2022  3:54 AM  Discharge Date: 12/26/22    Problem List       1. * (Principal) RESOLVED: Sickle cell pain crisis (CMS/HCC)    2. Seizure disorder (CMS/HCC)    3. Superior vena cava occlusion (CMS/HCC)    4. Depression    5. Thrombocythemia    6. Anemia    7. History of deep venous thrombosis    8. History of pulmonary embolism    9. Pulmonary nodule    10. RESOLVED: AKI (acute kidney injury) (CMS/HCC)    11. RESOLVED: Migraine without aura and without status migrainosus, not intractable       History and Hospital Course  Patient is a 47 year old female who presented to the ED due to chest pain for 3 weeks that acutely worsened in ED. While in the ED, she was started on 2L NC O2. She was also witnessed to have a 55% O2 while sleeping, prompting workup including CTA. She did not have a PE, but did find a residual catheter within the SVC. CBC was significant for hgb of 8.4, negative covid/flu. She also had slight AKI on admission. She was admitted for an acute sickle cell pain crisis.     During hospitalization, she was given 1 unit of PRBC due to hgb drop down to 6.5 which recovered to baseline after PRBC. She was also started on fluids which improved her kidney function. She slowly weaned off of oxygen. We did call vascular surgery due to a residual catheter found on CT chest. Dr Greggory Stallion recommended that we just watch as it would require open heart surgery.  After optimizing inpatient management, it was determined by the inpatient team that she could be discharged home with close PCP follow up.     Lab and Imaging Summary:   Results from last 7 days   Lab Units 12/26/22  0904 12/25/22  0642 12/24/22  1933 12/24/22  0906   WBC K/microL 6.86 6.90  --  8.37    HGB g/dL 7.8* 7.4* 7.6* 6.5*   HCT % 22.0* 20.8* 21.1* 17.9*   PLT K/microL 412 324  --  310     Results from last 7 days   Lab Units 12/26/22  0904 12/25/22  1613 12/25/22  0642   SODIUM mmol/L 140 140 142   POTASSIUM mmol/L 3.6 3.6 3.8   CHLORIDE mmol/L 103 105 107   CARBON DIOXIDE mmol/L 26 24 24    BUN mg/dL 28.3 15.1   CREATININE mg/dL 76.1* 6.07* 3.71*   CALCIUM mg/dL 9.5 9.1 9.1   GLUCOSE mg/dL 0.62* 694* 854*   ANION GAP mmol/L 11 11 11    BCRATIO  11 14 13        Results from last 7 days   Lab Units 12/26/22  0904 12/25/22  1613 12/25/22  0642 12/24/22  0906 12/23/22  0949   SODIUM mmol/L 140 140 142 137 137   POTASSIUM mmol/L 3.6 3.6 3.8 4.3 5.1   CHLORIDE mmol/L 103 105 107 103 104   CARBON DIOXIDE mmol/L 26 24 24  20* 19*   BUN mg/dL 12/27/22 12/26/22 12/25/22 03.5   CREATININE mg/dL 00.9* 38.1* 82.9* 93.7* 1.78*   CALCIUM mg/dL 9.5 9.1  9.1 8.8 8.9   GLUCOSE mg/dL 103* 104* 107* 110* 168*   ALBUMIN g/dL  --   --  3.6* 3.7* 4.3   BILIRUBIN, TOTAL mg/dL  --   --  1.2 1.6* 2.7*   ALKALINE PHOSPHATASE U/L  --   --  82 83 104   ALT U/L  --   --  16 17 18    AST U/L  --   --  36* 57* 60*         Lab Results   Component Value Date    U GLUCOSE Normal 08/24/2018    U PROTEIN Negative 08/24/2018    U BILIRUBIN Negative 08/24/2018    U UROBILINOGEN Normal 08/24/2018    U PH 6.0 08/24/2018    U BLOOD OVER (A) 08/24/2018    U KETONES Negative 08/24/2018    U NITRITE Negative 08/24/2018    U LEUKOCYTE ESTERASE Negative 08/24/2018    U APPEAR Clear 08/24/2018    U SPECIFIC GRAVITY 1.010 08/24/2018    U MICRO Indicated 08/24/2018    CULT IND NO 08/24/2018    U RBC 9 (H) 08/24/2018    U WBC 2 08/24/2018    U SQUAM EPITHELIAL 2 08/24/2018    U MUCUS RARE 08/24/2018     Physical Exam  Constitutional: No distress.   Eyes: Conjunctivae are normal.   Pulmonary/Chest: Effort normal and breath sounds normal. She has no wheezes. She has no rales.   Abdominal: Soft. Bowel sounds are normal. She exhibits no distension. There is no  splenomegaly or hepatomegaly. There is abdominal tenderness (LUQ).   Neurological: She is alert and oriented to person, place, and time.   Skin: Skin is warm. No cyanosis. No jaundice.     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     Active issues Requiring Follow-Up:  Sickle Cell - continue meds, follow up with sickle cell doc, Dr [name], in [place]. Gave short dose of narcotics and narcan     2.   Pulmonary nodule - lung nodule found on CT in 6/23. She had follow up with lung nodule clinic, but wants to follow up with PCP. Consider repeat.      3. Hypoxia - patient became hypoxic inpatient requiring 2 L nasal cannula.  She was discharged home without home O2 assessment.  We will have her follow-up within 1 day for [facility] appointment to reassess oxygen    4.   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 Consulted vascular surgery during hospitalization  who said that this would require open heart surgery from a cardiothoracic surgeon to remove, and we should likely leave alone.     Medication Changes:  Start taking  Norco 10      Dietary Orders   (From admission, onward)             Start     Ordered    12/22/22 1703  Regular Diet  Diet effective now         12/22/22 1702               Future Appointments  Date Time Provider Department Center   12/27/2022 10:50 AM Gildardo Pounds, DO [facility] FAMILY RESID     Referrals and Follow-ups to Schedule       Ambulatory referral to Pulmonology and Lung Nodule Navigator      Lung Nodule Clinic for Lung Nodule >42mm?: Yes    Ambulatory referral to [facility]      PHP Referral Reason  (please provide comment in the field provided with a brief description of the reason for this referral): Health Partner    This is a referral for a PHP home/telephone visit.  A health partner will reach out to you for further scheduling details.  If you have any concerns about your health, call the PHP helpline at [phone].           Medication List       New Medications        Instructions   HYDROcodone-acetaminophen 10-325mg  per tablet  Commonly known as: generic for NORCO-10   1 tablet, Oral, Every 6 hours PRN    Naloxone HCl 4 MG/0.1ML liquid   1 Dose, Nasal, As needed          Continued Medications        Instructions   cyclobenzaprine 10mg  tablet  Commonly known as: generic for FLEXERIL   10 mg, Oral, 3 times daily PRN    fexofenadine 180mg  tablet  Commonly known as: generic for ALLEGRA   180 mg, Oral, Daily PRN    FLUoxetine 40 MG capsule  Commonly known as: PROzac   40 mg, Oral, Daily    folic acid 1mg  tablet  Commonly known as: generic for FOLVITE   1 mg, Oral, Daily    hydroxyurea 500mg  capsule  Commonly known as: generic for HYDREA   1,000 mg, Oral, Daily    levETIRAcetam 750mg  tablet  Commonly known as: generic for KEPPRA   1,500 mg, Oral, 2 times daily    rivaroxaban 20mg  tablet  Commonly known as: XARELTO   20 mg, Oral, Daily with dinner          Referrals and Other DC Orders       Ambulatory referral to Pulmonology and Lung Nodule Navigator      Ambulatory referral to [facility]           Time Spent On Discharge: >30 minutes    O'Steen, MD  12/26/2022    Portions of this note may have been dictated with voice recognition software.  This can result in confusion due to grammatical and/or word substitution errors.    Please contact me with any questions or need for clarification.      Electronically signed by , MD at 12/26/2022  2:44 PM EST    Associated attestation - , MD - 12/26/2022  2:44 PM EST  Formatting of this note might be different from the  original.  ATTENDING HOSPITAL ADMIT NOTE    This note was created with voice recognition software. There may be some inadvertent word substitutions and grammatical errors that result in some confusing statements. Please call with any questions or need for clarification.    ASSESSMENT & PLANS:     Patient seen, interviewed and examined. Additionally, I have reviewed the presentation, medical history, lab work, and initial plans for work up and management with the resident team at the time of admission.  Refer to Resident's history and physical for additional details.  Further plans have or will be  reviewed in rounds and on a daily basis with the resident team and in the chart.       47 year old female with significant past medical history of sickle cell disease current DVT anticoagulation admitted with sickle cell crisis and also found to have hypoxia in setting of human rhinovirus infection    Rest review of system negative except mentioned in resident's note.    Past medical history, social history, family history, old records reviewed.      Vitals: Reviewed.      Significant physical examination:  Back to baseline.  AO x3.  No acute distress    Labs and imaging: Reviewed.  Pertinent imaging reviewed with the team in person.      Assessment:  Sickle cell pain crisis, acute failure rhinovirus shin recurrent DVTs, pulmonary nodule.    Plan:  Patient walking in the oxygen.  Feeling back to baseline.  Patient is aware of retention of catheter in her neck area.  As per patient she is following with physician at [facility] for those.  Her hematologist is increased.  Case discussed with vascular surgeon here, catheter seems chronic and needs to follow-up with her provider at [facility].  Red flags discussed with the patient.  She also received 1 unit blood transfusion during hospitalization.  Close PCP.  She is reached optimization of her in-patient stay.  Red flag signs discussed with the patient.       Plan discussed with  multidisciplinary team.    Time spent on patient care 37 minute

## 2022-12-26 NOTE — Progress Notes (Signed)
Formatting of this note is different from the original.  PROGRESS NOTE    Length of Stay: 2 days  12/22/2022  Problem List       1. * (Principal) Sickle cell pain crisis (CMS/HCC)    2. Seizure disorder (CMS/HCC)    3. Superior vena cava occlusion (CMS/HCC)    4. Depression    5. Thrombocythemia    6. AKI (acute kidney injury) (CMS/HCC)    7. Anemia    8. History of deep venous thrombosis    9. Migraine without aura and without status migrainosus, not intractable    10. History of pulmonary embolism    11. Pulmonary nodule       Subjective   Patient started on 2L ON due to oxygen sat of 89. She denies any shortness of breath. She has continued LUQ pain which is slowly improving. She is hoping to go home today.       Objective   Physical Exam:   Physical Exam   Constitutional: No distress.   Eyes: Conjunctivae are normal.   Pulmonary/Chest: Effort normal and breath sounds normal. She has no wheezes. She has no rales.   Abdominal: Soft. Bowel sounds are normal. She exhibits no distension. There is no splenomegaly or hepatomegaly. There is abdominal tenderness (LUQ).   Neurological: She is alert and oriented to person, place, and time.   Skin: Skin is warm. No cyanosis. No jaundice.     No intake or output data in the 24 hours ending 12/26/22 0651    Vitals:    12/25/22 1542 12/25/22 2000 12/25/22 2007 12/25/22 2023   BP: 117/65  107/67    BP Location: Right arm      Patient Position: Lying      Pulse: 95  87    Resp: 18  18    Temp: 98.6 F (37 C)  98.1 F (36.7 C)    TempSrc: Oral  Oral    SpO2: 97% (!) 89% 95% 95%   Weight:       Height:         Results from last 7 days   Lab Units 12/25/22  0642 12/24/22  1933 12/24/22  0906 12/23/22  1904   WBC K/microL 6.90  --  8.37 10.61   HGB g/dL 7.4* 7.6* 6.5* 7.5*   HCT % 20.8* 21.1* 17.9* 21.7*   PLT K/microL 324  --  310 314     Results from last 7 days   Lab Units 12/25/22  1613 12/25/22  0642 12/24/22  0906   SODIUM mmol/L 140 142 137   POTASSIUM mmol/L 3.6 3.8 4.3    CHLORIDE mmol/L 105 107 103   CARBON DIOXIDE mmol/L 24 24 20*   BUN mg/dL 15.7 15.4 18.7   CREATININE mg/dL 1.09* 1.23* 1.72*   CALCIUM mg/dL 9.1 9.1 8.8   GLUCOSE mg/dL 104* 107* 110*   ANION GAP mmol/L 11 11 13    BCRATIO  14 13 11      Results from last 7 days   Lab Units 12/25/22  0642 12/24/22  0906 12/23/22  0949   ALKALINE PHOSPHATASE U/L 82 83 104   BILIRUBIN, TOTAL mg/dL 1.2 1.6* 2.7*   TOTAL PROTEIN g/dL 6.8 7.1 8.1   ALBUMIN g/dL 3.6* 3.7* 4.3   ALT U/L 16 17 18    AST U/L 36* 57* 60*       Assessment/Plan   Problem List       1. * (Principal)  Sickle cell pain crisis (CMS/HCC)    2. Seizure disorder (CMS/HCC)    3. Superior vena cava occlusion (CMS/HCC)    4. Depression    5. Thrombocythemia    6. AKI (acute kidney injury) (CMS/HCC)    7. Anemia    8. History of deep venous thrombosis    9. Migraine without aura and without status migrainosus, not intractable    10. History of pulmonary embolism    11. Pulmonary nodule     IMPRESSION  [name] Bright is a 47 yo female presenting with a PMH significant for sickle cell disease presenting with an acute pain crisis.  Patient with AKI and acute anemia, s/p 1u RBC transfusion.      PLAN  Acute Pain Crisis  - Continue Norco 10 q4 hr PRN    - Continue home folic acid and hydroxyurea.   - Zofran PRN  - s/p 1u RBC on 1/21, Hgb improved to 7.6, stable   - continue to monitor with daily CBC     AKI  Baseline Cr 0.8, increased to 1.72, improving with fluids. IV fluids stopped  - PO fluids  - continue to monitor     Shortness of breath  CXR, CTPE with no acute etiology.  RVP positive for rhino virus, Hx concerning for OSA, may also be secondary to acute anemia   - CPAP while sleeping   - Wean oxygen as tolerated   - Follow up with out patient sleep study     Seizures  H/o seizures. Last in 2018.   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   - Continue home Xarelto.   - No DVT noted on Venous Dopplers.     Anxiety   Home Prozac added.    FEN:  Full diet   Lines/Foley: PIV  PPX: Xarelto 20 mg daily   Code: Full   Dispo: Hopeful DC today     [place] O'Steen, MD  12/26/22      Electronically signed by O'Steen, [place], MD at 12/26/2022  8:14 AM EST

## 2022-12-26 NOTE — Care Plan (Signed)
Formatting of this note might be different from the original.      Care plan updated 12/26/22, Clemon Chambers, RN      Electronically signed by Clemon Chambers, RN at 12/26/2022 12:36 PM EST

## 2022-12-27 NOTE — Telephone Encounter (Signed)
Pt called asking to sched HFU appt

## 2022-12-28 NOTE — Progress Notes (Signed)
Formatting of this note is different from the original.   [facility] Patient Outreach Assessment    Patient Name: [name] Bright  MRN: [id number]   DOB: 07-28-1976 Age: 47 y.o. Sex: female  Lace+ Score:  28  30 Day Risk of Unplanned Readmission Score:  10%*  Acute Care PHP Tier: Very High:  46 - 100%     Visit #: : 1  Visit Type:: Initial Visit  Contacted via:: Telephone    Services:  none    Vitals:  There were no vitals filed for this visit.    Assessment:               COUGH:: Productive  SPUTUM:: Thick  Color:: green     Successful Initial visit. Phoned and spoke with patient. She was alert and oriented x3. She was able to review medications. She stated that she saw her PCP yesterday. She reported that he told her not to take the cyclobenzaprine while taking the Hydrocodone. She reported that she had all medications. She stated that she was waiting on return call from hematologist and pulmonary. She was informed that appt sheet and educational materials were mailed to the address in her chart. She stated that she is living with Mother at this time to help her. She stated that he Father or or other family members help her get to appts. She is hoping to get a car soon. Reviewed discharge instructions with patient. She stated that she is drinking lots of fluids to stay hydrated. She denied having any fall. Fall precautions were reviewed. PHP help line number was provided. SDOH completed.       Utilization / Medical Care Follow Up:   Did [facility] review patient?s discharge instructions with patient and/or family?: YES  Did [facility] review patient?s follow up appointments with patient and/or family?: YES  Did patient go to their PCP follow up appointment?       : YES  Has patient made any new MD appointments since last visit?   : YES  Does patient have adequate transportation to MD visits?      : YES  Mode of Transportation: private car      Medication Reconciliation:  Does patient  have all medication listed on the most recent medication list?: YES  Does the patient have any new medication to review?: YES  Was the patient instructed to take all medications to MD appointments?: YES  Does patient use a pill organizer?: NO  Does patient organize and dispense their own medications?                                                                                     : YES    Was discontinued medicine put in Stop bags provided to patient?: NO  Was discrepancies noted?: NO  Does patient use multiple pharmacies?: YES  Was contact and/or notification made with pharmacy/physician?: NO      DME:  Current DME Equipment:: Shower Chair  DME equipment needs:: NO  Is this equipment properly working?: YES  DME equipment being used appropriate times/ teach back: : YES      Mobility/Nutrition:  Did [facility] encourage patient to maintain healthy life style, exercising as tolerated, eating proper diet? : YES  Did [facility] provide instructions on fall prevention/risk factors?: YES  How many meals does the patient eat per day?                                                                                           : 3  Diet Plan:: Regular  Is a supplement currently being used?              : NO  Does patient have nutritional supplement needs?                                                                                               : NO        Chronic Care Disease Management:   Did [facility] discuss and teach patient self management of conditions?: YES    Condition specific action plan in home:: YES  Did [facility] discuss with patient target symptoms/side effects to monitor and what to do when issues arise? : YES  Did [facility] review appropriate level of care with patient?                                                                           : YES  Did [facility] review patient logs? (Glucose reading, Weight Log, Blood pressure): NO      Social/Emotional/Caregiver/Home  Safety:  Does patient have adequate support system at home?: YES  Identify community resources needed:: NO        Plan for Next Visit:  Plan for Next Visit:: phone visit  Next PHP Appointment Date: 01/04/23    Electronically signed by Ebbie Ridge, RN at 12/28/2022  9:27 AM EST

## 2022-12-28 NOTE — Telephone Encounter (Signed)
Contacted patient and scheduled HFU per her request.

## 2023-01-01 ENCOUNTER — Ambulatory Visit
Admit: 2023-01-01 | Payer: MEDICARE | Attending: Hematology | Primary: Student in an Organized Health Care Education/Training Program

## 2023-01-01 ENCOUNTER — Inpatient Hospital Stay: Admit: 2023-01-01 | Payer: MEDICARE | Primary: Student in an Organized Health Care Education/Training Program

## 2023-01-01 ENCOUNTER — Encounter

## 2023-01-01 DIAGNOSIS — D5700 Hb-SS disease with crisis, unspecified: Secondary | ICD-10-CM

## 2023-01-01 DIAGNOSIS — E559 Vitamin D deficiency, unspecified: Secondary | ICD-10-CM

## 2023-01-01 LAB — CBC WITH AUTO DIFFERENTIAL
Absolute Immature Granulocyte: 0 10*3/uL (ref 0.0–0.5)
Basophils %: 0 % (ref 0.0–2.0)
Basophils Absolute: 0 10*3/uL (ref 0.0–0.2)
Eosinophils %: 1 % (ref 0.5–7.8)
Eosinophils Absolute: 0.1 10*3/uL (ref 0.0–0.8)
Hematocrit: 26 % — ABNORMAL LOW (ref 35.8–46.3)
Hemoglobin: 8.9 g/dL — ABNORMAL LOW (ref 11.7–15.4)
Immature Granulocytes: 0 % (ref 0.0–5.0)
Lymphocytes %: 32 % (ref 13–44)
Lymphocytes Absolute: 1.8 10*3/uL (ref 0.5–4.6)
MCH: 37.6 PG — ABNORMAL HIGH (ref 26.1–32.9)
MCHC: 34.2 g/dL (ref 31.4–35.0)
MCV: 109.7 FL — ABNORMAL HIGH (ref 82.0–102.0)
MPV: 9.2 FL — ABNORMAL LOW (ref 9.4–12.3)
Monocytes %: 18 % — ABNORMAL HIGH (ref 4.0–12.0)
Monocytes Absolute: 1 10*3/uL (ref 0.1–1.3)
Neutrophils %: 49 % (ref 43–78)
Neutrophils Absolute: 2.7 10*3/uL (ref 1.7–8.2)
Platelets: 520 10*3/uL — ABNORMAL HIGH (ref 150–450)
RBC: 2.37 M/uL — ABNORMAL LOW (ref 4.05–5.2)
RDW: 21.1 % — ABNORMAL HIGH (ref 11.9–14.6)
WBC: 5.6 10*3/uL (ref 4.3–11.1)
nRBC: 0.19 10*3/uL (ref 0.0–0.2)

## 2023-01-01 LAB — COMPREHENSIVE METABOLIC PANEL
ALT: 19 U/L (ref 12–65)
AST: 20 U/L (ref 15–37)
Albumin/Globulin Ratio: 0.8 (ref 0.4–1.6)
Albumin: 3.7 g/dL (ref 3.5–5.0)
Alk Phosphatase: 90 U/L (ref 50–136)
Anion Gap: 9 mmol/L (ref 2–11)
BUN: 8 MG/DL (ref 6–23)
CO2: 28 mmol/L (ref 21–32)
Calcium: 9 MG/DL (ref 8.3–10.4)
Chloride: 103 mmol/L (ref 103–113)
Creatinine: 1.1 MG/DL — ABNORMAL HIGH (ref 0.6–1.0)
Est, Glom Filt Rate: >60 mL/min/{1.73_m2} (ref >60)
Globulin: 4.6 g/dL — ABNORMAL HIGH (ref 2.8–4.5)
Glucose: 108 mg/dL — ABNORMAL HIGH (ref 65–100)
Potassium: 3.1 mmol/L — ABNORMAL LOW (ref 3.5–5.1)
Sodium: 140 mmol/L (ref 136–146)
Total Bilirubin: 1.3 MG/DL — ABNORMAL HIGH (ref 0.2–1.1)
Total Protein: 8.3 g/dL — ABNORMAL HIGH (ref 6.3–8.2)

## 2023-01-01 LAB — VITAMIN D 25 HYDROXY: Vit D, 25-Hydroxy: 30.4 ng/mL (ref 30.0–100.0)

## 2023-01-01 LAB — D-DIMER, QUANTITATIVE: D-Dimer, Quant: 0.49 ug/ml(FEU) (ref <0.56)

## 2023-01-01 MED ORDER — HYDROMORPHONE HCL 2 MG PO TABS
2 MG | ORAL_TABLET | Freq: Two times a day (BID) | ORAL | 0 refills | Status: AC | PRN
Start: 2023-01-01 — End: 2023-01-31

## 2023-01-01 MED ORDER — VITAMIN D (ERGOCALCIFEROL) 1.25 MG (50000 UT) PO CAPS
1.2550000 MG (50000 UT) | ORAL_CAPSULE | Freq: Every day | ORAL | 0 refills | Status: DC
Start: 2023-01-01 — End: 2023-06-20
  Filled 2023-01-01: qty 14, 14d supply, fill #0

## 2023-01-01 MED ORDER — HYDROXYUREA 500 MG PO CAPS
500 MG | ORAL_CAPSULE | Freq: Every day | ORAL | 5 refills | Status: DC
Start: 2023-01-01 — End: 2024-01-22
  Filled 2022-12-25: qty 60, 30d supply, fill #0

## 2023-01-01 NOTE — Progress Notes (Signed)
[place] [address]  Phone: [phone]           01/01/2023  Kabao Leite  16-Sep-1976  [id number]         [name] Bright is a 47 year old African-American female who has returned to my clinic for a follow-up visit; she was initially referred to me by Dr. Trena Platt for management of Sickle Cell Disease; she is on Hydrea 1000 mg/day.        ALLERGIES:    Latex.        FAMILY HISTORY:    Her brother died of Sickle Cell Crisis.        SOCIAL HISTORY:    She is single and lives alone. She is on SSI. She denies ever using any tobacco products.        PAST MEDICAL HISTORY:     Migraines, Seizures, Vitamin D deficiency, right arm DVT, Pulmonary Embolism, Depression, splenic infarction, GERD, renal insufficiency, Osteoarthritis, SVC thrombosis and Sickle Cell Disease.        ROS:  The patient complained of fatigue and exertional dyspnea; all other systems negative.        PHYSICAL EXAM:   The patient was alert, awake and oriented, no acute distress was noted. Oral examination did not reveal any mucosal lesions. Lymph node examination did not reveal any adenopathy. Neck examination revealed a supple neck, no thyromegaly or masses were noted. Chest examination revealed normal vesicular breath sounds. Heart examination revealed S-1 and S-2 without any murmurs. Abdominal examination revealed a non-tender abdomen, bowel sounds were positive, no organomegaly could be appreciated. Examination of the extremities did not reveal any tenderness or erythema. Examination of the skin did not reveal any lesions.  Medical problems and test results were reviewed with the patient today.        KPS:    80.        LABORATORY INVESTIGATIONS:  CBC showed a WBC count of 5.6, ANC was 2.7, Hemoglobin was 8.9 and Platelets were 520.        ASSESSMENT:    Sickle Cell Disease with pain; Vitamin D deficiency; Pulmonary Embolism. I spent a total of 42 minutes on the day of the visit, managing the  care of this patient.        PLAN:    She should continue taking Xarelto 20 mg/day, Folic acid 1 mg/day, Hydrea 1000 mg/day and supplemental Vitamin D; at her next clinic visit I will re-check her CBC, CMP, D-dimers and Vitamin D level.        Floyde Parkins MD  Hematology/BMT

## 2023-01-01 NOTE — Patient Instructions (Addendum)
Patient Instructions from Today's Visit    Reason for Visit:  Follow Up    Diagnosis Information:  https://www.cancer.net/about-us/asco-answers-patient-education-materials/asco-answers-fact-sheets    Plan:  Lab work looks stable today  We would like you to continue taking Hydrea as directed (1,000 mg/daily)  Vitamin D is low, so we will send in a prescription for Vitamin D 50,00 units    -take 1 pill daily for 14 days    -when you finish taking the prescription, start taking over the counter Vitamin D 1,000 units daily  We will send in a prescription for Dilaudid 4 mg to help with your pain crisis     -this will not have any refills on it    -if you need refills, please reach out to your PCP    Follow Up:  We will bring you back in July for a follow up with Dr.Khan and lab work prior.    Recent Lab Results:  Hospital Outpatient Visit on 01/01/2023   Component Date Value Ref Range Status    D-Dimer, Quant 01/01/2023 0.49  <0.56 ug/ml(FEU) Final    Comment: (NOTE)  A D-Dimer result less than 0.5 ug/mL FEU combined with a low clinical   pretest probability of DVT and/or PE has a negative predictive value   of 90-100%.  The positive predictive value is 50% or less.      Sodium 01/01/2023 140  136 - 146 mmol/L Final    Potassium 01/01/2023 3.1 (L)  3.5 - 5.1 mmol/L Final    Chloride 01/01/2023 103  103 - 113 mmol/L Final    CO2 01/01/2023 28  21 - 32 mmol/L Final    Anion Gap 01/01/2023 9  2 - 11 mmol/L Final    Glucose 01/01/2023 108 (H)  65 - 100 mg/dL Final    BUN 01/01/2023 8  6 - 23 MG/DL Final    Creatinine 01/01/2023 1.10 (H)  0.6 - 1.0 MG/DL Final    Est, Glom Filt Rate 01/01/2023 >60  >60 ml/min/1.34m2 Final    Comment:    Pediatric calculator link: https://www.kidney.org/professionals/kdoqi/gfr_calculatorped     These results are not intended for use in patients <84 years of age.     eGFR results are calculated without a race factor using  the 2021 CKD-EPI equation. Careful clinical correlation is  recommended, particularly when comparing to results calculated using previous equations.  The CKD-EPI equation is less accurate in patients with extremes of muscle mass, extra-renal metabolism of creatinine, excessive creatine ingestion, or following therapy that affects renal tubular secretion.      Calcium 01/01/2023 9.0  8.3 - 10.4 MG/DL Final    Total Bilirubin 01/01/2023 1.3 (H)  0.2 - 1.1 MG/DL Final    ALT 01/01/2023 19  12 - 65 U/L Final    AST 01/01/2023 20  15 - 37 U/L Final    Alk Phosphatase 01/01/2023 90  50 - 136 U/L Final    Total Protein 01/01/2023 8.3 (H)  6.3 - 8.2 g/dL Final    Albumin 01/01/2023 3.7  3.5 - 5.0 g/dL Final    Globulin 01/01/2023 4.6 (H)  2.8 - 4.5 g/dL Final    Albumin/Globulin Ratio 01/01/2023 0.8  0.4 - 1.6   Final    WBC 01/01/2023 5.6  4.3 - 11.1 K/uL Final    RBC 01/01/2023 2.37 (L)  4.05 - 5.2 M/uL Final    Hemoglobin 01/01/2023 8.9 (L)  11.7 - 15.4 g/dL Final    Hematocrit 01/01/2023 26.0 (L)  35.8 - 46.3 % Final    MCV 01/01/2023 109.7 (H)  82.0 - 102.0 FL Final    MCH 01/01/2023 37.6 (H)  26.1 - 32.9 PG Final    MCHC 01/01/2023 34.2  31.4 - 35.0 g/dL Final    RDW 01/01/2023 21.1 (H)  11.9 - 14.6 % Final    Platelets 01/01/2023 520 (H)  150 - 450 K/uL Final    MPV 01/01/2023 9.2 (L)  9.4 - 12.3 FL Final    nRBC 01/01/2023 0.19  0.0 - 0.2 K/uL Final    **Note: Absolute NRBC parameter is now reported with Hemogram**    Neutrophils % 01/01/2023 49  43 - 78 % Final    Lymphocytes % 01/01/2023 32  13 - 44 % Final    Monocytes % 01/01/2023 18 (H)  4.0 - 12.0 % Final    Eosinophils % 01/01/2023 1  0.5 - 7.8 % Final    Basophils % 01/01/2023 0  0.0 - 2.0 % Final    Immature Granulocytes 01/01/2023 0  0.0 - 5.0 % Final    Neutrophils Absolute 01/01/2023 2.7  1.7 - 8.2 K/UL Final    Lymphocytes Absolute 01/01/2023 1.8  0.5 - 4.6 K/UL Final    Monocytes Absolute 01/01/2023 1.0  0.1 - 1.3 K/UL Final    Eosinophils Absolute 01/01/2023 0.1  0.0 - 0.8 K/UL Final    Basophils Absolute  01/01/2023 0.0  0.0 - 0.2 K/UL Final    Absolute Immature Granulocyte 01/01/2023 0.0  0.0 - 0.5 K/UL Final    Differential Type 01/01/2023 AUTOMATED    Final             -------------------------------------------------------------------------------------------------------------------  Please call our office at [phone] if you have any  of the following symptoms:   Fever of 100.5 or greater  Chills  Shortness of breath  Swelling or pain in one leg    After office hours an answering service is available and will contact a provider for emergencies or if you are experiencing any of the above symptoms.    Patient has My Chart.  My Chart log in information explained on the after visit summary printout at the [facility] desk.    Howard Pouch, MA

## 2023-06-12 ENCOUNTER — Emergency Department: Admit: 2023-06-13 | Payer: MEDICARE | Primary: Student in an Organized Health Care Education/Training Program

## 2023-06-12 DIAGNOSIS — D5700 Hb-SS disease with crisis, unspecified: Secondary | ICD-10-CM

## 2023-06-12 NOTE — ED Provider Notes (Addendum)
8:59 AM EDT  This patient been seen by Dr. Mikki Harbor overnight.  Treated for a sickle cell pain crisis.  She had received 2 doses of IV Dilaudid 2 mg doses each.  The last dose was around 2:30 in the morning.  The nurse came to this morning planning to discharge the patient but noting that she was profoundly hypoxic and still seemed pretty sleepy.  She is quite hypoxic when sleeping with saturations in the low 80s with a good waveform.  However when she wakes back up her oxygenation comes up to the mid upper 90s.  She is able to get up and walk under her own power to the bathroom.  She had a pulse oximeter on at that time and was not hypoxic as well.  She denies any shortness of breath.  However when she gets back in bed she seems drowsy, quickly falls back to sleep and becomes hypoxic.  Her last dose of pain medication was over 5 hours ago and despite prolonged observation in the ER she remains hypoxic with sleeping.  I do not think she can be safely discharged home at this time.  In addition she states she is continuing to have some discomfort.  I think she would benefit from admission to the hospital and continued observation to ensure that she improves.    I doubt pulmonary embolism as the patient has no hypoxia when she is awake, and also no hypoxia with ambulation.  Her neurological exam is reassuring she has no focal neurological deficits.  Will we will obtain an ABG to ensure that she does not have hypercarbia though I think this is unlikely.    Disposition: Admission to the hospital  Diagnosis: Sickle cell disease, hypoxia, drowsiness     Paulita Fujita, MD  06/13/23 [id number]       Paulita Fujita, MD  06/13/23 [phone]

## 2023-06-12 NOTE — ED Triage Notes (Signed)
Patient ambulatory to triage. States sickle cell crisis. Patient states has been out pain medication for months.

## 2023-06-12 NOTE — ED Provider Notes (Signed)
Emergency Department Provider Note       PCP: Lupita Raider, DO   Age: 47 y.o.   Sex: female     DISPOSITION Decision To Discharge 06/13/2023 03:02:53 AM       ICD-10-CM    1. Sickle cell crisis (HCC)  D57.00           Medical Decision Making     Pain significantly improved.  Hemoglobin slightly lower than baseline.  Advised close follow-up with hematologist.     1 chronic illness with exacerbation.  Parental controlled substances given in the ED.  Patient was discharged risks and benefits of hospitalization were considered.  Shared medical decision making was utilized in creating the patients health plan today.    I independently ordered and reviewed each unique test.  I reviewed external records: ED visit note from an outside group.  I reviewed external records: provider visit note from PCP.  I reviewed external records: provider visit note from outside specialist.  I reviewed external records: previous EKG including cardiologist interpretation.    I reviewed external records: previous lab results from outside ED.  I reviewed external records: previous imaging study including radiologist interpretation.     I interpreted the X-rays no acute cardiopulmonary process.              History     47 year old female with history of sickle cell presents with "pain all over" for the past 4 days.  She has been helping her sister move for the past 5 days.  She is out of Dilaudid.  She is followed by hematology.  She reports some chest discomfort and shortness of breath.  She denies cough or fever.  Denies nausea, vomiting, diarrhea.          ROS     Review of Systems   Constitutional:  Negative for fever.   HENT:  Negative for facial swelling.    Eyes:  Negative for visual disturbance.   Respiratory:  Negative for cough and shortness of breath.    Cardiovascular:  Positive for chest pain.   Gastrointestinal:  Negative for abdominal pain, diarrhea and vomiting.   Musculoskeletal:  Positive for arthralgias, back pain and  myalgias. Negative for joint swelling.   Skin:  Negative for rash.   Neurological:  Negative for speech difficulty.   Psychiatric/Behavioral:  Negative for confusion.    All other systems reviewed and are negative.       Physical Exam     Vitals signs and nursing note reviewed:  Vitals:    06/12/23 2103   BP: (!) 144/83   Pulse: 86   Resp: 20   Temp: 98.3 F (36.8 C)   TempSrc: Oral   SpO2: 100%   Weight: 76.2 kg (168 lb)   Height: 1.727 m (5\' 8" )      Physical Exam  Vitals and nursing note reviewed.   Constitutional:       Appearance: Normal appearance. She is well-developed.      Comments: Appears uncomfortable   HENT:      Head: Normocephalic and atraumatic.      Nose: Nose normal.      Mouth/Throat:      Mouth: Mucous membranes are moist.   Eyes:      Extraocular Movements: Extraocular movements intact.      Pupils: Pupils are equal, round, and reactive to light.   Cardiovascular:      Rate and Rhythm: Normal rate and regular rhythm.  Heart sounds: Normal heart sounds.   Pulmonary:      Effort: Pulmonary effort is normal. No respiratory distress.      Breath sounds: Normal breath sounds.   Abdominal:      General: Abdomen is flat. There is no distension.      Palpations: Abdomen is soft.   Musculoskeletal:         General: Normal range of motion.   Skin:     General: Skin is warm and dry.   Neurological:      General: No focal deficit present.      Mental Status: She is alert. Mental status is at baseline.   Psychiatric:         Mood and Affect: Mood normal.        Procedures     Procedures    Orders Placed This Encounter   Procedures    XR CHEST (2 VW)    Reticulocytes    Comprehensive Metabolic Panel    CBC with Auto Differential    Saline lock IV        Medications given during this emergency department visit:  Medications   HYDROmorphone HCl PF (DILAUDID) injection 2 mg (2 mg IntraVENous Given 06/13/23 0054)   ondansetron (ZOFRAN) injection 4 mg (4 mg IntraVENous Given 06/13/23 0052)   HYDROmorphone HCl  PF (DILAUDID) injection 2 mg (2 mg IntraVENous Given 06/13/23 0156)   diphenhydrAMINE (BENADRYL) injection 25 mg (25 mg IntraVENous Given 06/13/23 0156)   ondansetron (ZOFRAN) injection 4 mg (4 mg IntraVENous Given 06/13/23 0156)       New Prescriptions    No medications on file        Past Medical History:   Diagnosis Date    Arthritis     Hx of blood clots     Migraines     Seizures (HCC)     Sickle cell disease (HCC)     Stroke (HCC)         Past Surgical History:   Procedure Laterality Date    CESAREAN SECTION      CHOLECYSTECTOMY      LAPAROTOMY      TUBAL LIGATION      VASCULAR SURGERY          Social History     Socioeconomic History    Marital status: Single   Tobacco Use    Smoking status: Never     Passive exposure: Never    Smokeless tobacco: Never   Substance and Sexual Activity    Alcohol use: Not Currently    Drug use: Never    Sexual activity: Not Currently        Previous Medications    DIPHENHYDRAMINE (SOMINEX) 25 MG TABLET    Take 1 tablet by mouth    DIPHENHYDRAMINE-APAP, SLEEP, (TYLENOL PM EXTRA STRENGTH) 25-500 MG TABLET    Take 1 tablet by mouth daily    FEXOFENADINE (ALLEGRA) 180 MG TABLET    Take 1 tablet by mouth daily    FLUOXETINE (PROZAC) 20 MG CAPSULE    Take 2 capsules by mouth daily    FLUTICASONE (FLONASE) 50 MCG/ACT NASAL SPRAY    2 sprays by Each Nostril route in the morning.    FOLIC ACID (FOLVITE) 1 MG TABLET    Take 1 tablet by mouth daily    HYDROCODONE-ACETAMINOPHEN (NORCO) 10-325 MG PER TABLET    Take 1 tablet by mouth every 6 hours as needed. Max Daily Amount: 4 tablets  HYDROXYUREA (HYDREA) 500 MG CHEMO CAPSULE    Take 2 capsules by mouth daily Take 2 capsules by mouth daily    HYDROXYUREA (HYDREA) 500 MG CHEMO CAPSULE    Take 2 capsules by mouth daily    LEVETIRACETAM (KEPPRA) 750 MG TABLET    Take 2 tablets by mouth in the morning and 2 tablets before bedtime.    NALOXONE 4 MG/0.1ML LIQD NASAL SPRAY    1 spray by Nasal route as needed for Opioid Reversal    ONDANSETRON  (ZOFRAN ODT) 8 MG TBDP DISINTEGRATING TABLET    Place 0.5-1 tablets under the tongue every 6-8 hours as needed for Nausea or Vomiting    RIVAROXABAN (XARELTO) 20 MG TABS TABLET    Take 1 tablet by mouth daily (with breakfast)    VITAMIN D (ERGOCALCIFEROL) 1.25 MG (50000 UT) CAPS CAPSULE    Take 1 capsule by mouth daily for 14 days    VITAMIN D (ERGOCALCIFEROL) 1.25 MG (50000 UT) CAPS CAPSULE    Take 1 capsule by mouth daily for 14 days        Results for orders placed or performed during the hospital encounter of 06/12/23   XR CHEST (2 VW)    Narrative    Chest X-ray    INDICATION: Chest pain    COMPARISON: May 27, 2022 TECHNIQUE: PA and lateral views of the chest were  obtained.    FINDINGS: The lungs are clear. There are no infiltrates or effusions.  The heart  size is normal.  The bony thorax is intact.        Impression    No acute findings in the chest      Electronically signed by Berdine Addison   Reticulocytes   Result Value Ref Range    Reticulocyte Count,Automated 4.6 (H) 0.3 - 2.0 %    Absolute Retic # [id number] 0.026 - 0.095 M/ul    Immature Retic Fraction 30.4 (H) 3.0 - 15.9 %    Retic Hemoglobin conc. 40 (H) 29 - 35 pg   Comprehensive Metabolic Panel   Result Value Ref Range    Sodium 138 136 - 145 mmol/L    Potassium 4.3 3.5 - 5.1 mmol/L    Chloride 104 98 - 107 mmol/L    CO2 22 20 - 28 mmol/L    Anion Gap 12 9 - 18 mmol/L    Glucose 101 (H) 70 - 99 mg/dL    BUN 13 6 - 23 MG/DL    Creatinine 9.60 (H) 0.60 - 1.10 MG/DL    Est, Glom Filt Rate 58 (L) >60 ml/min/1.24m2    Calcium 9.6 8.8 - 10.2 MG/DL    Total Bilirubin 1.0 0.0 - 1.2 MG/DL    ALT 9 (L) 12 - 65 U/L    AST 28 15 - 37 U/L    Alk Phosphatase 117 (H) 35 - 104 U/L    Total Protein 8.4 (H) 6.3 - 8.2 g/dL    Albumin 4.3 3.5 - 5.0 g/dL    Globulin 4.1 (H) 2.3 - 3.5 g/dL    Albumin/Globulin Ratio 1.1 1.0 - 1.9     CBC with Auto Differential   Result Value Ref Range    WBC 5.1 4.3 - 11.1 K/uL    RBC 1.61 (L) 4.05 - 5.2 M/uL    Hemoglobin 7.3 (L) 11.7 -  15.4 g/dL    Hematocrit 45.4 (L) 35.8 - 46.3 %    MCV 129.2 (H) 82 - 102 FL  MCH 45.3 (H) 26.1 - 32.9 PG    MCHC 35.1 (H) 31.4 - 35.0 g/dL    RDW 16.1 (H) 09.6 - 14.6 %    Platelets 476 (H) 150 - 450 K/uL    MPV 9.6 9.4 - 12.3 FL    nRBC 2.45 (H) 0.0 - 0.2 K/uL    Neutrophils % 48 43 - 78 %    Lymphocytes % 40 13 - 44 %    Monocytes % 11 4.0 - 12.0 %    Eosinophils % 1 0.5 - 7.8 %    Basophils % 0 0.0 - 2.0 %    Immature Granulocytes % 0 0.0 - 5.0 %    Neutrophils Absolute 2.4 1.7 - 8.2 K/UL    Lymphocytes Absolute 2.0 0.5 - 4.6 K/UL    Monocytes Absolute 0.6 0.1 - 1.3 K/UL    Eosinophils Absolute 0.1 0.0 - 0.8 K/UL    Basophils Absolute 0.0 0.0 - 0.2 K/UL    Immature Granulocytes Absolute 0.0 0.0 - 0.5 K/UL    RBC Comment        RARE HEMOGLOBIN C CRYSTALS PRESENT  RARE  POLYCHROMASIA      RBC Comment SLIGHT  TARGET CELLS        RBC Comment SLIGHT  MACROCYTOSIS        WBC Comment Result Confirmed By Smear      Platelet Comment INCREASED      Differential Type AUTOMATED           XR CHEST (2 VW)   Final Result   No acute findings in the chest         Electronically signed by Berdine Addison                   No results for input(s): "COVID19" in the last 72 hours.    Voice dictation software was used during the making of this note.  This software is not perfect and grammatical and other typographical errors may be present.  This note has not been completely proofread for errors.     Samella Parr, MD  06/13/23 [phone]

## 2023-06-13 ENCOUNTER — Inpatient Hospital Stay: Admit: 2023-06-13 | Payer: MEDICARE | Primary: Student in an Organized Health Care Education/Training Program

## 2023-06-13 ENCOUNTER — Inpatient Hospital Stay
Admission: EM | Admit: 2023-06-13 | Discharge: 2023-06-20 | Disposition: A | Discharge status: Home / Self Care | Payer: MEDICARE | Admitting: Hematology & Oncology

## 2023-06-13 ENCOUNTER — Inpatient Hospital Stay
Admit: 2023-06-13 | Discharge: 2023-06-14 | Payer: MEDICARE | Primary: Student in an Organized Health Care Education/Training Program

## 2023-06-13 LAB — ARTERIAL BLOOD GAS, POC
Base Deficit (POC): 1.2 mmol/L
FIO2: 2
POC Allen's Test: POSITIVE
POC HCO3: 26.4 MMOL/L — ABNORMAL HIGH (ref 22–26)
POC O2 SAT: 21.3 % — ABNORMAL LOW (ref 95–98)
POC PO2: 19 MMHG — CL (ref 75–100)
POC pCO2: 60.9 MMHG (ref 35–45)
POC pH: 7.25 — ABNORMAL LOW (ref 7.35–7.45)

## 2023-06-13 LAB — RESPIRATORY PANEL, MOLECULAR, WITH COVID-19

## 2023-06-13 LAB — CBC WITH AUTO DIFFERENTIAL
Basophils %: 0 % (ref 0.0–2.0)
Basophils Absolute: 0 10*3/uL (ref 0.0–0.2)
Eosinophils %: 1 % (ref 0.5–7.8)
Eosinophils Absolute: 0.1 10*3/uL (ref 0.0–0.8)
Hematocrit: 20.8 % — ABNORMAL LOW (ref 35.8–46.3)
Hemoglobin: 7.3 g/dL — ABNORMAL LOW (ref 11.7–15.4)
Immature Granulocytes %: 0 % (ref 0.0–5.0)
Immature Granulocytes Absolute: 0 10*3/uL (ref 0.0–0.5)
Lymphocytes %: 40 % (ref 13–44)
Lymphocytes Absolute: 2 10*3/uL (ref 0.5–4.6)
MCH: 45.3 PG — ABNORMAL HIGH (ref 26.1–32.9)
MCHC: 35.1 g/dL — ABNORMAL HIGH (ref 31.4–35.0)
MCV: 129.2 FL — ABNORMAL HIGH (ref 82–102)
MPV: 9.6 FL (ref 9.4–12.3)
Monocytes %: 11 % (ref 4.0–12.0)
Monocytes Absolute: 0.6 10*3/uL (ref 0.1–1.3)
Neutrophils %: 48 % (ref 43–78)
Neutrophils Absolute: 2.4 10*3/uL (ref 1.7–8.2)
Platelet Comment: INCREASED
Platelets: 476 10*3/uL — ABNORMAL HIGH (ref 150–450)
RBC: 1.61 M/uL — ABNORMAL LOW (ref 4.05–5.2)
RDW: 16.5 % — ABNORMAL HIGH (ref 11.9–14.6)
WBC: 5.1 10*3/uL (ref 4.3–11.1)
nRBC: 2.45 10*3/uL — ABNORMAL HIGH (ref 0.0–0.2)

## 2023-06-13 LAB — COMPREHENSIVE METABOLIC PANEL
ALT: 9 U/L — ABNORMAL LOW (ref 12–65)
AST: 28 U/L (ref 15–37)
Albumin/Globulin Ratio: 1.1 (ref 1.0–1.9)
Albumin: 4.3 g/dL (ref 3.5–5.0)
Alk Phosphatase: 117 U/L — ABNORMAL HIGH (ref 35–104)
Anion Gap: 12 mmol/L (ref 9–18)
BUN: 13 MG/DL (ref 6–23)
CO2: 22 mmol/L (ref 20–28)
Calcium: 9.6 MG/DL (ref 8.8–10.2)
Chloride: 104 mmol/L (ref 98–107)
Creatinine: 1.16 MG/DL — ABNORMAL HIGH (ref 0.60–1.10)
Est, Glom Filt Rate: 58 mL/min/{1.73_m2} — ABNORMAL LOW (ref >60)
Globulin: 4.1 g/dL — ABNORMAL HIGH (ref 2.3–3.5)
Glucose: 101 mg/dL — ABNORMAL HIGH (ref 70–99)
Potassium: 4.3 mmol/L (ref 3.5–5.1)
Sodium: 138 mmol/L (ref 136–145)
Total Bilirubin: 1 MG/DL (ref 0.0–1.2)
Total Protein: 8.4 g/dL — ABNORMAL HIGH (ref 6.3–8.2)

## 2023-06-13 LAB — RETICULOCYTES
Absolute Retic #: 0.0739 M/ul (ref 0.026–0.095)
Immature Retic Fraction: 30.4 % — ABNORMAL HIGH (ref 3.0–15.9)
Retic Hemoglobin conc.: 40 pg — ABNORMAL HIGH (ref 29–35)
Reticulocyte Count,Automated: 4.6 % — ABNORMAL HIGH (ref 0.3–2.0)

## 2023-06-13 MED ORDER — NALOXONE HCL 4 MG/0.1ML NA LIQD
4 | NASAL | Status: DC | PRN
Start: 2023-06-13 — End: 2023-06-13

## 2023-06-13 MED ORDER — NORMAL SALINE FLUSH 0.9 % IV SOLN
0.9 | Freq: Two times a day (BID) | INTRAVENOUS | Status: DC
Start: 2023-06-13 — End: 2023-06-20
  Administered 2023-06-14 – 2023-06-20 (×12): 10 mL via INTRAVENOUS

## 2023-06-13 MED ORDER — HYDROMORPHONE HCL PF 1 MG/ML IJ SOLN
1 | INTRAMUSCULAR | Status: DC | PRN
Start: 2023-06-13 — End: 2023-06-20
  Administered 2023-06-13 – 2023-06-16 (×8): 1 mg via INTRAVENOUS

## 2023-06-13 MED ORDER — PROCHLORPERAZINE EDISYLATE 10 MG/2ML IJ SOLN
10 | Freq: Four times a day (QID) | INTRAMUSCULAR | Status: DC | PRN
Start: 2023-06-13 — End: 2023-06-20

## 2023-06-13 MED ORDER — FLUOXETINE HCL 10 MG PO CAPS
10 MG | Freq: Every day | ORAL | Status: AC
Start: 2023-06-13 — End: 2023-06-20
  Administered 2023-06-13 – 2023-06-20 (×8): 40 mg via ORAL

## 2023-06-13 MED ORDER — LEVETIRACETAM 500 MG PO TABS
500 | Freq: Two times a day (BID) | ORAL | Status: DC
Start: 2023-06-13 — End: 2023-06-20
  Administered 2023-06-14 – 2023-06-20 (×14): 1500 mg via ORAL

## 2023-06-13 MED ORDER — NORMAL SALINE FLUSH 0.9 % IV SOLN
0.9 | INTRAVENOUS | Status: DC | PRN
Start: 2023-06-13 — End: 2023-06-20

## 2023-06-13 MED ORDER — FOLIC ACID 1 MG PO TABS
1 | Freq: Every day | ORAL | Status: DC
Start: 2023-06-13 — End: 2023-06-20
  Administered 2023-06-13 – 2023-06-20 (×8): 1 mg via ORAL

## 2023-06-13 MED ORDER — PROCHLORPERAZINE MALEATE 10 MG PO TABS
10 | Freq: Four times a day (QID) | ORAL | Status: DC | PRN
Start: 2023-06-13 — End: 2023-06-20

## 2023-06-13 MED ORDER — ONDANSETRON HCL 4 MG/2ML IJ SOLN
4 | Freq: Four times a day (QID) | INTRAMUSCULAR | Status: DC | PRN
Start: 2023-06-13 — End: 2023-06-20

## 2023-06-13 MED ORDER — POLYETHYLENE GLYCOL 3350 17 G PO PACK
17 | Freq: Every day | ORAL | Status: DC | PRN
Start: 2023-06-13 — End: 2023-06-20

## 2023-06-13 MED ORDER — ONDANSETRON 4 MG PO TBDP
4 | Freq: Four times a day (QID) | ORAL | Status: DC | PRN
Start: 2023-06-13 — End: 2023-06-20
  Administered 2023-06-13: 21:00:00 4 mg via ORAL

## 2023-06-13 MED ORDER — SODIUM CHLORIDE 0.9 % IV SOLN
0.9 % | INTRAVENOUS | Status: AC | PRN
Start: 2023-06-13 — End: 2023-06-20

## 2023-06-13 MED ORDER — HYDROMORPHONE HCL PF 1 MG/ML IJ SOLN
1 | INTRAMUSCULAR | Status: AC
Start: 2023-06-13 — End: 2023-06-13
  Administered 2023-06-13: 05:00:00 2 mg via INTRAVENOUS

## 2023-06-13 MED ORDER — HYDROMORPHONE HCL 2 MG PO TABS
2 | Freq: Four times a day (QID) | ORAL | Status: DC | PRN
Start: 2023-06-13 — End: 2023-06-16
  Administered 2023-06-13 – 2023-06-15 (×4): 2 mg via ORAL

## 2023-06-13 MED ORDER — HYDROMORPHONE HCL 2 MG PO TABS
2 | Freq: Four times a day (QID) | ORAL | Status: DC | PRN
Start: 2023-06-13 — End: 2023-06-13

## 2023-06-13 MED ORDER — ONDANSETRON HCL 4 MG/2ML IJ SOLN
4 | INTRAMUSCULAR | Status: AC
Start: 2023-06-13 — End: 2023-06-13
  Administered 2023-06-13: 05:00:00 4 mg via INTRAVENOUS

## 2023-06-13 MED ORDER — DIPHENHYDRAMINE HCL 50 MG/ML IJ SOLN
50 | INTRAMUSCULAR | Status: AC
Start: 2023-06-13 — End: 2023-06-13
  Administered 2023-06-13: 06:00:00 25 mg via INTRAVENOUS

## 2023-06-13 MED ORDER — RIVAROXABAN 20 MG PO TABS
20 | Freq: Every day | ORAL | Status: DC
Start: 2023-06-13 — End: 2023-06-20
  Administered 2023-06-14 – 2023-06-20 (×6): 20 mg via ORAL

## 2023-06-13 MED ORDER — CETIRIZINE HCL 10 MG PO TABS
10 | Freq: Every day | ORAL | Status: DC
Start: 2023-06-13 — End: 2023-06-20
  Administered 2023-06-13 – 2023-06-20 (×8): 10 mg via ORAL

## 2023-06-13 MED ORDER — NALOXONE HCL 0.4 MG/ML IJ SOLN
0.4 | INTRAMUSCULAR | Status: DC | PRN
Start: 2023-06-13 — End: 2023-06-20

## 2023-06-13 MED ORDER — ACETAMINOPHEN 325 MG PO TABS
325 | Freq: Four times a day (QID) | ORAL | Status: DC | PRN
Start: 2023-06-13 — End: 2023-06-20
  Administered 2023-06-14: 13:00:00 650 mg via ORAL

## 2023-06-13 MED ORDER — DEXTROSE-SODIUM CHLORIDE 5-0.45 % IV SOLN
5-0.45 % | INTRAVENOUS | Status: AC
Start: 2023-06-13 — End: 2023-06-20
  Administered 2023-06-13 – 2023-06-20 (×10): via INTRAVENOUS

## 2023-06-13 MED ORDER — ONDANSETRON HCL 4 MG/2ML IJ SOLN
4 | INTRAMUSCULAR | Status: AC
Start: 2023-06-13 — End: 2023-06-13
  Administered 2023-06-13: 06:00:00 4 mg via INTRAVENOUS

## 2023-06-13 MED ORDER — IOPAMIDOL 76 % IV SOLN
76 | Freq: Once | INTRAVENOUS | Status: AC | PRN
Start: 2023-06-13 — End: 2023-06-13
  Administered 2023-06-13: 21:00:00 75 mL via INTRAVENOUS

## 2023-06-13 MED ORDER — HYDROXYUREA 500 MG PO CAPS
500 MG | Freq: Every day | ORAL | Status: AC
Start: 2023-06-13 — End: 2023-06-20
  Administered 2023-06-14 – 2023-06-20 (×7): 1000 mg via ORAL

## 2023-06-13 MED ORDER — HYDROMORPHONE HCL PF 1 MG/ML IJ SOLN
1 | INTRAMUSCULAR | Status: AC
Start: 2023-06-13 — End: 2023-06-13
  Administered 2023-06-13: 06:00:00 2 mg via INTRAVENOUS

## 2023-06-13 MED FILL — DILAUDID 1 MG/ML IJ SOLN: 1 MG/ML | INTRAMUSCULAR | Qty: 2

## 2023-06-13 MED FILL — ONDANSETRON HCL 4 MG/2ML IJ SOLN: 4 MG/2ML | INTRAMUSCULAR | Qty: 2

## 2023-06-13 MED FILL — FOLIC ACID 1 MG PO TABS: 1 MG | ORAL | Qty: 1

## 2023-06-13 MED FILL — DIPHENHYDRAMINE HCL 50 MG/ML IJ SOLN: 50 MG/ML | INTRAMUSCULAR | Qty: 1

## 2023-06-13 MED FILL — CETIRIZINE HCL 10 MG PO TABS: 10 MG | ORAL | Qty: 1

## 2023-06-13 MED FILL — HYDROMORPHONE HCL 1 MG/ML IJ SOLN: 1 MG/ML | INTRAMUSCULAR | Qty: 1

## 2023-06-13 MED FILL — ONDANSETRON 4 MG PO TBDP: 4 MG | ORAL | Qty: 1

## 2023-06-13 MED FILL — FLUOXETINE HCL 10 MG PO CAPS: 10 MG | ORAL | Qty: 4

## 2023-06-13 MED FILL — HYDROMORPHONE HCL 2 MG PO TABS: 2 MG | ORAL | Qty: 1

## 2023-06-13 NOTE — Progress Notes (Signed)
TRANSFER - IN REPORT:    Verbal report received from Weston Brass, RN on [name] Bright  being received from [facility] ER for routine progression of patient care      Report consisted of patient's Situation, Background, Assessment and   Recommendations(SBAR).     Information from the following report(s) Nurse Handoff Report, ED Encounter Summary, ED SBAR, Adult Overview, Intake/Output, MAR, Recent Results, Quality Measures, Neuro Assessment, and Event Log was reviewed with the receiving nurse.    Opportunity for questions and clarification was provided.      Room 537 set up to receive patient into our care.

## 2023-06-13 NOTE — Progress Notes (Signed)
1217Weston Brass, RN in [facility] ER aware that patient needs to be swabbed for RVP to rule out COVID19 d/t protocol d/t patient will be transferred to BMT area.    1230- RVP ordered.     1349- Attempted to speak  to Weston Brass, RN in [facility] ER- no answer; on hold for 7 minutes; patient in need of RVP to rule out COVID19 per protocol d/t patient will be in the BMT- patient has not yet been swabbed.

## 2023-06-13 NOTE — Progress Notes (Signed)
4 Eyes Skin Assessment     NAME:  [name] Bright  DATE OF BIRTH:  1976-02-27  MEDICAL RECORD NUMBER:  [id number]    The patient is being assessed for  Admission    I agree that at least one RN has performed a thorough Head to Toe Skin Assessment on the patient. ALL assessment sites listed below have been assessed.      Areas assessed by both nurses:    Head, Face, Ears, Shoulders, Back, Chest, Arms, Elbows, Hands, Sacrum. Buttock, Coccyx, Ischium, and Legs. Feet and Heels        Does the Patient have a Wound? No noted wound(s)       Braden Prevention initiated by RN: Yes  Wound Care Orders initiated by RN: No    Pressure Injury (Stage 3,4, Unstageable, DTI, NWPT, and Complex wounds) if present, place Wound referral order by RN under ORDER ENTRY: No    New Ostomies, if present place, Ostomy referral order under ORDER ENTRY: No     Nurse 1 eSignature: Electronically signed by Ricki Miller, RN on 06/13/23 at 5:18 PM EDT    **SHARE this note so that the co-signing nurse can place an eSignature**    Nurse 2 eSignature: Electronically signed by Alvie Heidelberg, RN on 06/13/23 at 5:34 PM EDT

## 2023-06-13 NOTE — ED Notes (Signed)
TRANSFER - OUT REPORT:    Verbal report given to [place] on [name] Bright  being transferred to 537 for routine progression of patient care       Report consisted of patient's Situation, Background, Assessment and   Recommendations(SBAR).     Information from the following report(s) ED SBAR was reviewed with the receiving nurse.    Kinder Fall Assessment:    Presents to emergency department  because of falls (Syncope, seizure, or loss of consciousness): No  Age > 70: No  Altered Mental Status, Intoxication with alcohol or substance confusion (Disorientation, impaired judgment, poor safety awaremess, or inability to follow instructions): No  Impaired Mobility: Ambulates or transfers with assistive devices or assistance; Unable to ambulate or transer.: No             Lines:       Opportunity for questions and clarification was provided.      Patient transported with:  Othelia Pulling., RN  06/13/23 1421

## 2023-06-13 NOTE — Plan of Care (Signed)
Problem: Pain  Goal: Verbalizes/displays adequate comfort level or baseline comfort level  Outcome: Progressing     Problem: Safety - Adult  Goal: Free from fall injury  Outcome: Progressing     Problem: ABCDS Injury Assessment  Goal: Absence of physical injury  Outcome: Progressing

## 2023-06-13 NOTE — Significant Event (Signed)
Updated Dr. Veneda Melter with ABG results.  Pt on Airvo.  CTA chest pending.  Pulm consult ordered.      Trisha Mangle, APRN - CNP  [facility]

## 2023-06-13 NOTE — ED Notes (Signed)
Ultrasound was used to find the vein which was compressible and does not have any features of an artery or nerve bundle. Skin was cleaned and disinfected prior to IV puncture. Under real-time ultrasound guidance, peripheral access was obtained in the R Cephalic 20g Peripheral IV catheter x 1 attempt/s. Blood return was present and IV flushed without difficulty. IV dressing applied, no immediate complications noted, and patient tolerated the procedure well.       [name], RN  06/13/23 [phone]

## 2023-06-13 NOTE — ED Notes (Signed)
Transport here for pt at this time.     Therese Sarah., RN  06/13/23 1650

## 2023-06-13 NOTE — ED Notes (Addendum)
Attempted to discharge patient. Patient's oxygen dropped to 80% on room air and falling asleep mid-sentence. 2L o2 applied and oxygen rose to 95%. Dr. Konrad Penta notified. No signs of distress.        [name], [place]  06/13/23 [phone]

## 2023-06-13 NOTE — Progress Notes (Signed)
Patient with +1 bacteria in UA, discussed with MD., Dr. Veneda Melter on phone.

## 2023-06-13 NOTE — ED Notes (Signed)
Attempted to discharge patient. Patient's oxygen dropped to 85% on room air.   2L applied and oxygen rose to 95%     [name], [place]  06/13/23 [phone]

## 2023-06-13 NOTE — H&P (Addendum)
Attending Addendum:  Patient seen with NP.  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  She was seen in ED with pain/sickle cell crisis.  We were asked to admit as pt got opiod therapy for crisis and developed hypoxia when sleeping.  She was admitted on 7/9.  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  She is on folic acid and hydroxyurea.  She came to ED w pain all over.  She did admit to helping her mom move in last few days.  She was out of Dilaudid at home, but also does not use it regularly.  She also reported SOB.  She was treated w Dilaudid in ED.  Per ED pt with desaturations to 80s when asleep.  When she is awake and walking around O2 was normal.  We were asked to admit for close monitoring until resolution.  No reported fevers/cough.  CXR neg.  UA pending.  RVP pending.  Hb 7 with bl 7-8.  C/w folate/hydrohyurea.  CTA chest ordered.  C/w IVF and pain management as needed.  Call to pt's RN on 5th floor to make sure pt on continuous pulseox as was not.  LUE pain - doppler w no DVT.  Has hx of SVC thrombosis/VTE - c/w DOAC.      I personally performed a face to face diagnostic evaluation on this patient.  My findings are as follows: A&ox3, able to provide detailed hx, lungs clear, heart regular, abdomen benign and no LE edema, pt tender over left UE.      I reviewed the current findings, plan and labs/imaging with the pt.          Philis Pique, MD  [facility]  [address]  [place], [place] [address]  Office : [phone]  Fax : [phone]          [facility] Hemat[facility]        Inpatient Hematology / Oncology History and Physical    Reason for Admission:  Sickle cell crisis [facility]) [D57.00]    History of Present Illness:  Ms. Tetter is a 47 y.o. female admitted on 06/12/2023. The primary encounter diagnosis was Sickle cell crisis (HCC). 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  She is a patient of Dr. Welton Flakes followed for sickle cell disease on folic acid 1mg  daily and Hydrea 1000mg  daily.  She presented to ED with c/o "pain all over" for the past four days.  She has been helping family move for the past 5 days.  Out of dilaudid at home.  She reports some chest discomfort and shortness of breath.  Denies cough or fever.  CXR neg.  UA pending.  RVP pending.  Hgb 7.3 (b/l ~7-8).  She is being admitted for further management of sickle cell crisis.    Review of Systems:  Constitutional Denies fever, chills, weight loss, appetite changes, fatigue, night sweats.   HEENT Denies trauma, blurry vision, hearing loss, ear pain, nosebleeds, sore throat, neck pain and ear discharge.    Skin Denies lesions or rashes.   Lungs +shortness of breath.  Denies cough, sputum production or hemoptysis.   Cardiovascular Denies chest pain, palpitations, or lower extremity  edema.   Gastrointestinal Denies nausea, vomiting, changes in bowel habits, bloody or black stools, abdominal pain.   GU Denies dysuria, frequency or hesitancy of urination.   Neuro Denies headaches, visual changes or ataxia. Denies dizziness, tingling, tremors, sensory change, speech change, focal weakness or headaches.     Hematology Denies easy bruising or bleeding, denies gingival bleeding or epistaxis.   Endo Denies heat/cold intolerance, denies diabetes or thyroid abnormalities.   MSK +chest discomfort, arthralgias, myalgias, back pain, LUE pain     Psychiatric/Behavioral Hx depression. The patient is not nervous/anxious.         Allergies   Allergen Reactions    Latex Rash    Ceftriaxone Other (See Comments)    Fentanyl Other (See Comments)     "my doctor said I had a stroke from it"    Influenza Vaccines Itching     unknown       Influenza Virus Vaccine Swelling    Meperidine Other (See Comments)    Morphine Itching    Oxycodone Other (See Comments)    Sulfa Antibiotics Rash     Past Medical History:   Diagnosis Date    Arthritis     Hx of blood clots     Migraines     Seizures (HCC)     Sickle cell disease (HCC)     Stroke (HCC)      Past Surgical History:   Procedure Laterality Date    CESAREAN SECTION      CHOLECYSTECTOMY      LAPAROTOMY      TUBAL LIGATION      VASCULAR SURGERY       Family History   Problem Relation Age of Onset    Diabetes Mother     Hypertension Mother     Diabetes Father     Hypertension Father      Social History     Socioeconomic History    Marital status: Single     Spouse name: Not on file    Number of children: Not on file    Years of education: Not on file    Highest education level: Not on file   Occupational History    Not on file   Tobacco Use    Smoking status: Never     Passive exposure: Never    Smokeless tobacco: Never   Substance and Sexual Activity    Alcohol use: Not Currently    Drug use: Never    Sexual activity: Not Currently   Other Topics Concern    Not on file   Social History Narrative    Not on file     Social Determinants of Health     Financial Resource Strain: Not on file   Food Insecurity: Not on file   Transportation Needs: Not on file   Physical Activity: Not on file   Stress: Not on file   Social Connections: Not on file   Intimate Partner Violence: Not on file   Housing Stability: Not on file     Current Facility-Administered Medications   Medication Dose Route Frequency Provider Last Rate Last Admin    iopamidol (ISOVUE-370) 76 % injection 75 mL  75 mL IntraVENous ONCE PRN Dennie Fetters, MD         Current Outpatient Medications   Medication Sig Dispense Refill    HYDROcodone-acetaminophen (NORCO) 10-325 MG per tablet Take 1 tablet by mouth every 6 hours as needed. Max Daily Amount: 4 tablets  vitamin D (ERGOCALCIFEROL) 1.25 MG (50000 UT) CAPS capsule Take 1 capsule by mouth  daily for 14 days 14 capsule 0    hydroxyurea (HYDREA) 500 MG chemo capsule Take 2 capsules by mouth daily 60 capsule 5    vitamin D (ERGOCALCIFEROL) 1.25 MG (50000 UT) CAPS capsule Take 1 capsule by mouth daily for 14 days (Patient not taking: Reported on 01/01/2023) 14 capsule 0    naloxone 4 MG/0.1ML LIQD nasal spray 1 spray by Nasal route as needed for Opioid Reversal (Patient not taking: Reported on 06/14/2022) 1 each 0    diphenhydrAMINE (SOMINEX) 25 MG tablet Take 1 tablet by mouth (Patient not taking: Reported on 06/14/2022)      fexofenadine (ALLEGRA) 180 MG tablet Take 1 tablet by mouth daily      ondansetron (ZOFRAN ODT) 8 MG TBDP disintegrating tablet Place 0.5-1 tablets under the tongue every 6-8 hours as needed for Nausea or Vomiting (Patient not taking: Reported on 01/01/2023) 20 tablet 0    fluticasone (FLONASE) 50 MCG/ACT nasal spray 2 sprays by Each Nostril route in the morning. (Patient not taking: Reported on 01/01/2023) 16 g 1    levETIRAcetam (KEPPRA) 750 MG tablet Take 2 tablets by mouth in the morning and 2 tablets before bedtime. 60 tablet 1    diphenhydrAMINE-APAP, sleep, (TYLENOL PM EXTRA STRENGTH) 25-500 MG tablet Take 1 tablet by mouth daily      FLUoxetine (PROZAC) 20 MG capsule Take 2 capsules by mouth daily      folic acid (FOLVITE) 1 MG tablet Take 1 tablet by mouth daily      rivaroxaban (XARELTO) 20 MG TABS tablet Take 1 tablet by mouth daily (with breakfast)         OBJECTIVE:  Patient Vitals for the past 8 hrs:   BP Pulse Resp SpO2   06/13/23 1200 123/83 82 18 100 %   06/13/23 1116 -- -- -- 100 %   06/13/23 1100 125/81 86 -- 100 %   06/13/23 0816 -- -- -- 93 %   06/13/23 0805 139/78 (!) 108 -- 98 %   06/13/23 0720 -- -- -- 100 %   06/13/23 0717 -- -- -- (!) 83 %   06/13/23 0703 136/83 -- -- (!) 84 %   06/13/23 0633 118/87 -- -- 100 %   06/13/23 0603 122/86 -- -- (!) 68 %   06/13/23 0533 (!) 126/94 -- -- 97 %   06/13/23 0503 128/87 -- -- 97 %   06/13/23 0433 136/79 -- -- 95 %     Temp  (24hrs), Avg:98.3 F (36.8 C), Min:98.3 F (36.8 C), Max:98.3 F (36.8 C)    No intake/output data recorded.    Physical Exam:  Constitutional: Acutely ill-appearing female in no acute distress, sitting comfortably in the hospital bed.    HEENT: Normocephalic and atraumatic. Oropharynx is clear, mucous membranes are moist.  Extraocular muscles are intact.  Sclerae anicteric. Neck supple without JVD. No thyromegaly present.    Skin Warm and dry.  No bruising and no rash noted.  No erythema.  No pallor.    Neuro Grossly nonfocal with no obvious sensory or motor deficits.   MSK Normal range of motion in general.     Psych Appropriate mood and affect.    Full exam per attending MD    Labs:    Recent Results (from the past 24 hour(s))   Arterial Blood Gas, POC    Collection Time: 06/13/23 10:55 AM  Result Value Ref Range    DEVICE NASAL CANNULA      POC pH 7.25 (L) 7.35 - 7.45      POC pCO2 60.9 (HH) 35 - 45 MMHG    POC PO2 19 (LL) 75 - 100 MMHG    POC HCO3 26.4 (H) 22 - 26 MMOL/L    POC O2 SAT 21.3 (L) 95 - 98 %    Base Deficit (POC) 1.2 mmol/L    POC Allen's Test Positive      Site RIGHT RADIAL      Specimen type: ARTERIAL      Performed by: [email or web address]     Critical Value Read Back BLANKENSHIP     FIO2 2         Imaging:  Xray Result (most recent):  XR CHEST STANDARD TWO VW 06/12/2023    Narrative  Chest X-ray    INDICATION: Chest pain    COMPARISON: May 27, 2022 TECHNIQUE: PA and lateral views of the chest were  obtained.    FINDINGS: The lungs are clear. There are no infiltrates or effusions.  The heart  size is normal.  The bony thorax is intact.    Impression  No acute findings in the chest      Electronically signed by Berdine Addison        ASSESSMENT:  Principal Problem:    Sickle cell crisis (HCC)  Resolved Problems:    * No resolved hospital problems. *    Ms. Cator is a 47 y.o. female admitted on 06/12/2023. The primary encounter diagnosis was Sickle cell crisis (HCC). 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  She is a patient of Dr. Welton Flakes followed for sickle cell disease on folic acid 1mg  daily and Hydrea 1000mg  daily.  She presented to ED with c/o "pain all over" for the past four days.  She has been helping family move for the past 5 days.  Out of dilaudid at home.  She reports some chest discomfort and shortness of breath.  Denies cough or fever.  CXR neg.  UA pending.  RVP pending.  Hgb 7.3 (b/l ~7-8).  She is being admitted for further management of sickle cell crisis.      PLAN:  Sickle cell crisis  - con't folic acid, hydrea  - RVP pending.  CXR neg.  CTA chest ordered.  - UA pending.  - D5 1/2 NS @ / hr  - No IV benadryl or IV phenergan  - Pain mgmt    LUE pain  - LUE dopp ordered    Hx SVC thrombosis / RUE DVT / PE  - con't Xarelto    Continue home meds  Supportive care  On Xarelto    Dispo:  TBD pending clinical course              Trisha Mangle, APRN - CNP   [facility] Hematology & Oncology  [address]  [place] [address]  Office : [phone]  Fax : [phone]

## 2023-06-13 NOTE — ED Notes (Signed)
Attempted IV access without success. Other RN attempted to find a vein unsuccesfully.   Certified ultrasound nurse requested.      [name], RN  06/13/23 [phone]

## 2023-06-13 NOTE — Progress Notes (Signed)
Transport to ultrasound department delayed due to patient being put on airvo.

## 2023-06-13 NOTE — ED Notes (Signed)
Pt ambulatory to bathroom. Sitting up eating at this time. O2 sat 95%     Therese Sarah RN  06/13/23 [phone]

## 2023-06-14 LAB — URINALYSIS
Bilirubin, Urine: NEGATIVE
Blood, Urine: NEGATIVE
Glucose, Ur: NEGATIVE mg/dL
Ketones, Urine: NEGATIVE mg/dL
Leukocyte Esterase, Urine: NEGATIVE
Nitrite, Urine: NEGATIVE
Specific Gravity, UA: >1.035 — ABNORMAL HIGH (ref 1.001–1.023)
Urobilinogen, Urine: 0.2 EU/dL (ref 0.2–1.0)
pH, Urine: 6.5 (ref 5.0–9.0)

## 2023-06-14 LAB — EKG 12-LEAD
Atrial Rate: 75 {beats}/min
Diagnosis: NORMAL
P Axis: 35 degrees
P-R Interval: 152 ms
Q-T Interval: 390 ms
QRS Duration: 82 ms
QTc Calculation (Bazett): 435 ms
R Axis: 28 degrees
T Axis: 29 degrees
Ventricular Rate: 75 {beats}/min

## 2023-06-14 LAB — COMPREHENSIVE METABOLIC PANEL
ALT: <5 U/L — ABNORMAL LOW (ref 12–65)
AST: 26 U/L (ref 15–37)
Albumin/Globulin Ratio: 0.9 — ABNORMAL LOW (ref 1.0–1.9)
Albumin: 3.9 g/dL (ref 3.5–5.0)
Alk Phosphatase: 99 U/L (ref 35–104)
Anion Gap: 13 mmol/L (ref 9–18)
BUN: 9 MG/DL (ref 6–23)
CO2: 21 mmol/L (ref 20–28)
Calcium: 9.1 MG/DL (ref 8.8–10.2)
Chloride: 102 mmol/L (ref 98–107)
Creatinine: 1.1 MG/DL (ref 0.60–1.10)
Est, Glom Filt Rate: 62 mL/min/{1.73_m2} (ref >60)
Globulin: 4.1 g/dL — ABNORMAL HIGH (ref 2.3–3.5)
Glucose: 117 mg/dL — ABNORMAL HIGH (ref 70–99)
Potassium: 3.8 mmol/L (ref 3.5–5.1)
Sodium: 136 mmol/L (ref 136–145)
Total Bilirubin: 0.7 MG/DL (ref 0.0–1.2)
Total Protein: 8.1 g/dL (ref 6.3–8.2)

## 2023-06-14 LAB — CBC WITH AUTO DIFFERENTIAL
Basophils %: 0 % (ref 0.0–2.0)
Basophils Absolute: 0 10*3/uL (ref 0.0–0.2)
Eosinophils %: 1 % (ref 0.5–7.8)
Eosinophils Absolute: 0.1 10*3/uL (ref 0.0–0.8)
Hematocrit: 19.4 % — ABNORMAL LOW (ref 35.8–46.3)
Hemoglobin: 6.6 g/dL — CL (ref 11.7–15.4)
Immature Granulocytes %: 0 % (ref 0.0–5.0)
Immature Granulocytes Absolute: 0 10*3/uL (ref 0.0–0.5)
Lymphocytes %: 35 % (ref 13–44)
Lymphocytes Absolute: 2 10*3/uL (ref 0.5–4.6)
MCH: 44.9 PG — ABNORMAL HIGH (ref 26.1–32.9)
MCHC: 34 g/dL (ref 31.4–35.0)
MCV: 132 FL — ABNORMAL HIGH (ref 82–102)
MPV: 9.6 FL (ref 9.4–12.3)
Monocytes %: 12 % (ref 4.0–12.0)
Monocytes Absolute: 0.7 10*3/uL (ref 0.1–1.3)
Neutrophils %: 52 % (ref 43–78)
Neutrophils Absolute: 2.8 10*3/uL (ref 1.7–8.2)
Platelet Comment: ADEQUATE
Platelets: 381 10*3/uL (ref 150–450)
RBC: 1.47 M/uL — ABNORMAL LOW (ref 4.05–5.2)
RDW: 16.6 % — ABNORMAL HIGH (ref 11.9–14.6)
WBC: 5.6 10*3/uL (ref 4.3–11.1)
nRBC: 1.44 10*3/uL — ABNORMAL HIGH (ref 0.0–0.2)

## 2023-06-14 LAB — RETICULOCYTES
Absolute Retic #: 0.0673 M/ul (ref 0.026–0.095)
Immature Retic Fraction: 26.9 % — ABNORMAL HIGH (ref 3.0–15.9)
Retic Hemoglobin conc.: 35 pg (ref 29–35)
Reticulocyte Count,Automated: 4.6 % — ABNORMAL HIGH (ref 0.3–2.0)

## 2023-06-14 LAB — PERIPHERAL BLOOD SMEAR, PATH REVIEW

## 2023-06-14 LAB — MAGNESIUM: Magnesium: 1.9 mg/dL (ref 1.8–2.4)

## 2023-06-14 MED ORDER — SODIUM CHLORIDE 0.9 % IV SOLN
0.9 | INTRAVENOUS | Status: DC | PRN
Start: 2023-06-14 — End: 2023-06-14

## 2023-06-14 MED ORDER — KETOROLAC TROMETHAMINE 30 MG/ML IJ SOLN
30 | Freq: Four times a day (QID) | INTRAMUSCULAR | Status: AC
Start: 2023-06-14 — End: 2023-06-19
  Administered 2023-06-14 – 2023-06-19 (×20): 30 mg via INTRAVENOUS

## 2023-06-14 MED FILL — FLUOXETINE HCL 10 MG PO CAPS: 10 MG | ORAL | Qty: 4

## 2023-06-14 MED FILL — KETOROLAC TROMETHAMINE 30 MG/ML IJ SOLN: 30 MG/ML | INTRAMUSCULAR | Qty: 1

## 2023-06-14 MED FILL — HYDREA 500 MG PO CAPS: 500 MG | ORAL | Qty: 2

## 2023-06-14 MED FILL — XARELTO 20 MG PO TABS: 20 MG | ORAL | Qty: 1

## 2023-06-14 MED FILL — FOLIC ACID 1 MG PO TABS: 1 MG | ORAL | Qty: 1

## 2023-06-14 MED FILL — HYDROMORPHONE HCL 1 MG/ML IJ SOLN: 1 MG/ML | INTRAMUSCULAR | Qty: 1

## 2023-06-14 MED FILL — LEVETIRACETAM 500 MG PO TABS: 500 MG | ORAL | Qty: 3

## 2023-06-14 MED FILL — HYDROMORPHONE HCL 2 MG PO TABS: 2 MG | ORAL | Qty: 1

## 2023-06-14 MED FILL — ACETAMINOPHEN 325 MG PO TABS: 325 MG | ORAL | Qty: 2

## 2023-06-14 MED FILL — CETIRIZINE HCL 10 MG PO TABS: 10 MG | ORAL | Qty: 1

## 2023-06-14 NOTE — Progress Notes (Addendum)
Attending Addendum:  Patient seen with NP.  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  She was seen in ED with pain/sickle cell crisis.  We were asked to admit as pt got opiod therapy for crisis and developed hypoxia when sleeping.  She was admitted on 7/9.  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  She is on folic acid and hydroxyurea.  She came to ED w pain all over.  She did admit to helping her mom move in last few days.  She was out of Dilaudid at home, but also does not use it regularly.  She reported SOB.  She was treated w Dilaudid in ED.  Per ED pt with desaturations to 80s when asleep.  When she is awake and walking around O2 was normal.  We were asked to admit for close monitoring until resolution.  No reported fevers/cough.  CXR neg.  UA neg.  RVP neg.  Hb 6.6 w IVF with bl 7-8.  C/w folate/hydrohyurea.  CTA chest w atelectasis, no PE/PNA.  C/w IVF and pain management as needed.  C/w continuous pulseox.  LUE pain - doppler w no DVT.  Has hx of SVC thrombosis/VTE - c/w DOAC.  Appreciate pulm consult - rec ambulation/IS and tapering O2.      I personally performed a face to face diagnostic evaluation on this patient.  My findings are as follows: A&ox3, lungs clear, heart regular, abdomen benign and no LE edema, pt tender over left UE.       I reviewed the current findings, plan and labs/imaging with the pt.            Philis Pique, MD  [facility]  [address]  [place], [place] [address]  Office : [phone]  Fax : [phone]        [facility]        Inpatient Hematology / Oncology Progress Note    Reason for Admission:  Sickle cell crisis (HCC) [D57.00]  Drowsiness [R40.0]  Hypoxia [R09.02]  Diffuse pain in left upper extremity [M79.602]    24 Hour Events:  Afebrile, VSS, on Airvo @ 45L / 50%    CTA chest neg for acute findings, shows  atelectasis  RVP neg  Pulm consult pending  Hgb down to 6.6, holding off on transfusion  Pain ongoing, toradol ordered    Transfusions: None  Replacements: None      ROS:  Constitutional: Negative for fever, chills.  CV: Negative for chest pain, palpitations, edema.  Respiratory: +cough. Negative for wheezing.  GI: +nausea. Negative for abdominal pain, diarrhea.    10 point review of systems is otherwise negative with the exception of the elements mentioned above in the HPI.           Allergies   Allergen Reactions    Latex Rash    Ceftriaxone Other (See Comments)    Fentanyl Other (See Comments)     "my doctor said I had a stroke from it"    Influenza Vaccines Itching     unknown      Influenza Virus Vaccine Swelling    Meperidine Other (See Comments)    Morphine Itching    Oxycodone Other (See Comments)    Sulfa Antibiotics Rash     Past Medical History:   Diagnosis Date    Arthritis  Hx of blood clots     Migraines     Seizures (HCC)     Sickle cell disease (HCC)     Stroke [facility])      Past Surgical History:   Procedure Laterality Date    CESAREAN SECTION      CHOLECYSTECTOMY      LAPAROTOMY      TUBAL LIGATION      VASCULAR SURGERY       Family History   Problem Relation Age of Onset    Diabetes Mother     Hypertension Mother     Diabetes Father     Hypertension Father      Social History     Socioeconomic History    Marital status: Single     Spouse name: Not on file    Number of children: Not on file    Years of education: Not on file    Highest education level: Not on file   Occupational History    Not on file   Tobacco Use    Smoking status: Never     Passive exposure: Never    Smokeless tobacco: Never   Substance and Sexual Activity    Alcohol use: Not Currently    Drug use: Never    Sexual activity: Not Currently   Other Topics Concern    Not on file   Social History Narrative    Not on file     Social Determinants of Health     Financial Resource Strain: Not on file   Food Insecurity: No Food Insecurity  (06/13/2023)    Hunger Vital Sign     Worried About Running Out of Food in the Last Year: Never true     Ran Out of Food in the Last Year: Never true   Transportation Needs: No Transportation Needs (06/13/2023)    PRAPARE - Therapist, art (Medical): No     Lack of Transportation (Non-Medical): No   Physical Activity: Not on file   Stress: Not on file   Social Connections: Not on file   Intimate Partner Violence: Not on file   Housing Stability: Low Risk  (06/13/2023)    Housing Stability Vital Sign     Unable to Pay for Housing in the Last Year: No     Number of Places Lived in the Last Year: 1     Unstable Housing in the Last Year: No     Current Facility-Administered Medications   Medication Dose Route Frequency Provider Last Rate Last Admin    cetirizine (ZYRTEC) tablet 10 mg  10 mg Oral Daily Trisha Mangle, APRN - CNP   10 mg at 06/13/23 1727    FLUoxetine (PROZAC) capsule 40 mg  40 mg Oral Daily Trisha Mangle, APRN - CNP   40 mg at 06/13/23 1727    folic acid (FOLVITE) tablet 1 mg  1 mg Oral Daily Trisha Mangle, APRN - CNP   1 mg at 06/13/23 1727    hydroxyurea (HYDREA) chemo capsule 1,000 mg  1,000 mg Oral Daily Trisha Mangle, APRN - CNP        levETIRAcetam (KEPPRA) tablet 1,500 mg  1,500 mg Oral BID Trisha Mangle, APRN - CNP   1,500 mg at 06/13/23 2124    rivaroxaban (XARELTO) tablet 20 mg  20 mg Oral Daily with breakfast Trisha Mangle, APRN - CNP        sodium chloride flush 0.9 % injection 5-40 mL  5-40 mL IntraVENous 2 times per day Trisha Mangle, APRN - CNP   10 mL at 06/13/23 2124    sodium chloride flush 0.9 % injection 5-40 mL  5-40 mL IntraVENous PRN Trisha Mangle, APRN - CNP        0.9 % sodium chloride infusion   IntraVENous PRN Trisha Mangle, APRN - CNP        polyethylene glycol (GLYCOLAX) packet 17 g  17 g Oral Daily PRN Trisha Mangle, APRN - CNP        dextrose 5 % and 0.45 % sodium chloride infusion   IntraVENous Continuous Trisha Mangle, APRN - CNP 125 mL/hr at 06/14/23 0242 New Bag at  06/14/23 0242    acetaminophen (TYLENOL) tablet 650 mg  650 mg Oral Q6H PRN Trisha Mangle, APRN - CNP        ondansetron (ZOFRAN-ODT) disintegrating tablet 4 mg  4 mg Oral Q6H PRN Trisha Mangle, APRN - CNP   4 mg at 06/13/23 1727    Or    ondansetron (ZOFRAN) injection 4 mg  4 mg IntraVENous Q6H PRN Trisha Mangle, APRN - CNP        prochlorperazine (COMPAZINE) tablet 10 mg  10 mg Oral Q6H PRN Trisha Mangle, APRN - CNP        Or    prochlorperazine (COMPAZINE) injection 10 mg  10 mg IntraVENous Q6H PRN Trisha Mangle, APRN - CNP        HYDROmorphone HCl PF (DILAUDID) injection 1 mg  1 mg IntraVENous Q3H PRN Trisha Mangle, APRN - CNP   1 mg at 06/14/23 0430    HYDROmorphone (DILAUDID) tablet 2 mg  2 mg Oral Q6H PRN Trisha Mangle, APRN - CNP   2 mg at 06/14/23 0259    naloxone [facility]) injection 0.4 mg  0.4 mg IntraVENous PRN Trisha Mangle, APRN - CNP           OBJECTIVE:  Patient Vitals for the past 8 hrs:   BP Temp Temp src Pulse Resp SpO2   06/14/23 0431 112/73 -- -- 75 -- 100 %   06/14/23 0308 -- -- -- 84 20 100 %   06/14/23 0253 (!) 108/59 98.8 F (37.1 C) Oral 84 22 100 %     Temp (24hrs), Avg:98.3 F (36.8 C), Min:97.8 F (36.6 C), Max:98.8 F (37.1 C)    No intake/output data recorded.    Physical Exam:  Constitutional: Acutely ill-appearing female in no acute distress, sitting comfortably in the hospital bed.    HEENT: Normocephalic and atraumatic. Oropharynx is clear, mucous membranes are moist.  Extraocular muscles are intact.  Sclerae anicteric. Neck supple without JVD. No thyromegaly present.    Skin Warm and dry.  No bruising and no rash noted.  No erythema.  No pallor.    Neuro Grossly nonfocal with no obvious sensory or motor deficits.   MSK Normal range of motion in general.     Psych Appropriate mood and affect.    Full exam per attending MD    Labs:    Recent Results (from the past 24 hour(s))   Arterial Blood Gas, POC    Collection Time: 06/13/23 10:55 AM   Result Value Ref Range    DEVICE NASAL CANNULA      POC  pH 7.25 (L) 7.35 - 7.45      POC pCO2 60.9 (HH) 35 - 45 MMHG    POC PO2 19 (LL) 75 - 100 MMHG    POC HCO3  26.4 (H) 22 - 26 MMOL/L    POC O2 SAT 21.3 (L) 95 - 98 %    Base Deficit (POC) 1.2 mmol/L    POC Allen's Test Positive      Site RIGHT RADIAL      Specimen type: ARTERIAL      Performed by: [email or web address]     Critical Value Read Back BLANKENSHIP     FIO2 2     Respiratory Panel, Molecular, with COVID-19 (Restricted: peds pts or suitable admitted adults)    Collection Time: 06/13/23 12:57 PM    Specimen: Nasopharyngeal   Result Value Ref Range    Adenovirus by PCR NOT DETECTED NOTDET      Coronavirus 229E by PCR NOT DETECTED NOTDET      Coronavirus HKU1 by PCR NOT DETECTED NOTDET      Coronavirus NL63 by PCR NOT DETECTED NOTDET      Coronavirus OC43 by PCR NOT DETECTED NOTDET      SARS-CoV-2, PCR NOT DETECTED NOTDET      Human Metapneumovirus by PCR NOT DETECTED NOTDET      Rhinovirus Enterovirus PCR NOT DETECTED NOTDET      Influenza A by PCR NOT DETECTED NOTDET      Influenza B PCR NOT DETECTED NOTDET      Parainfluenza 1 PCR NOT DETECTED NOTDET      Parainfluenza 2 PCR NOT DETECTED NOTDET      Parainfluenza 3 PCR NOT DETECTED NOTDET      Parainfluenza 4 PCR NOT DETECTED NOTDET      Respiratory Syncytial Virus by PCR NOT DETECTED NOTDET      Bordetella parapertussis by PCR NOT DETECTED NOTDET      Bordetella pertussis by PCR NOT DETECTED NOTDET      Chlamydophila Pneumonia PCR NOT DETECTED NOTDET      Mycoplasma pneumo by PCR NOT DETECTED NOTDET     EKG 12 Lead    Collection Time: 06/13/23  5:48 PM   Result Value Ref Range    Ventricular Rate 75 BPM    Atrial Rate 75 BPM    P-R Interval 152 [name]    QRS Duration 82 [name]    Q-T Interval 390 [name]    QTc Calculation (Bazett) 435 [name]    P Axis 35 degrees    R Axis 28 degrees    T Axis 29 degrees    Diagnosis       Normal sinus rhythm  Normal ECG  When compared with ECG of 14-Jun-2021 07:47,  No significant change was found  Confirmed by Tawni Carnes MD, [name] [phone]) on  06/13/2023 11:54:51 PM     Urinalysis w rflx microscopic    Collection Time: 06/13/23  9:45 PM   Result Value Ref Range    Color, UA YELLOW/STRAW      Appearance CLEAR      Specific Gravity, UA >1.035 (H) 1.001 - 1.023    pH, Urine 6.5 5.0 - 9.0      Protein, UA TRACE (A) NEG mg/dL    Glucose, Ur Negative mg/dL    Ketones, Urine Negative NEG mg/dL    Bilirubin, Urine Negative NEG      Blood, Urine Negative NEG      Urobilinogen, Urine 0.2 0.2 - 1.0 EU/dL    Nitrite, Urine Negative NEG      Leukocyte Esterase, Urine Negative NEG      WBC, UA 0-4 U4 /hpf    RBC, UA 0-5 U5 /hpf  Epithelial Cells, UA 0-5 U5 /hpf    BACTERIA, URINE 1+ (A) NEG /hpf    Hyaline Casts, UA 0-2 /lpf   Comprehensive Metabolic Panel    Collection Time: 06/14/23  5:53 AM   Result Value Ref Range    Sodium 136 136 - 145 mmol/L    Potassium 3.8 3.5 - 5.1 mmol/L    Chloride 102 98 - 107 mmol/L    CO2 21 20 - 28 mmol/L    Anion Gap 13 9 - 18 mmol/L    Glucose 117 (H) 70 - 99 mg/dL    BUN 9 6 - 23 MG/DL    Creatinine 6.29 5.28 - 1.10 MG/DL    Est, Glom Filt Rate 62 >60 ml/min/1.64m2    Calcium 9.1 8.8 - 10.2 MG/DL    Total Bilirubin 0.7 0.0 - 1.2 MG/DL    ALT <5 (L) 12 - 65 U/L    AST 26 15 - 37 U/L    Alk Phosphatase 99 35 - 104 U/L    Total Protein 8.1 6.3 - 8.2 g/dL    Albumin 3.9 3.5 - 5.0 g/dL    Globulin 4.1 (H) 2.3 - 3.5 g/dL    Albumin/Globulin Ratio 0.9 (L) 1.0 - 1.9     Magnesium    Collection Time: 06/14/23  5:53 AM   Result Value Ref Range    Magnesium 1.9 1.8 - 2.4 mg/dL   CBC with Auto Differential    Collection Time: 06/14/23  5:53 AM   Result Value Ref Range    WBC 5.6 4.3 - 11.1 K/uL    RBC 1.47 (L) 4.05 - 5.2 M/uL    Hemoglobin 6.6 (LL) 11.7 - 15.4 g/dL    Hematocrit 41.3 (L) 35.8 - 46.3 %    MCV 132.0 (H) 82 - 102 FL    MCH 44.9 (H) 26.1 - 32.9 PG    MCHC 34.0 31.4 - 35.0 g/dL    RDW 24.4 (H) 01.0 - 14.6 %    Platelets 381 150 - 450 K/uL    MPV 9.6 9.4 - 12.3 FL    nRBC 1.44 (H) 0.0 - 0.2 K/uL    Differential Type PENDING     Reticulocytes    Collection Time: 06/14/23  5:53 AM   Result Value Ref Range    Reticulocyte Count,Automated 4.6 (H) 0.3 - 2.0 %    Absolute Retic # 0.0673 0.026 - 0.095 M/ul    Immature Retic Fraction 26.9 (H) 3.0 - 15.9 %    Retic Hemoglobin conc. 35 29 - 35 pg   TYPE AND SCREEN    Collection Time: 06/14/23  7:03 AM   Result Value Ref Range    Crossmatch expiration date 06/17/2023,2359     ABO/Rh O POSITIVE     Antibody Screen NEG        Imaging:  Xray Result (most recent):  XR CHEST STANDARD TWO VW 06/12/2023    Narrative  Chest X-ray    INDICATION: Chest pain    COMPARISON: May 27, 2022 TECHNIQUE: PA and lateral views of the chest were  obtained.    FINDINGS: The lungs are clear. There are no infiltrates or effusions.  The heart  size is normal.  The bony thorax is intact.    Impression  No acute findings in the chest      Electronically signed by Berdine Addison    CT Result (most recent):  CT CHEST PULMONARY EMBOLISM W CONTRAST 06/13/2023    Narrative  CT CHEST PULMONARY EMBOLISM W CONTRAST    HISTORY:hypoxia, hx of PE, sickle cell crisis.    COMPARISON: 05/27/2022    TECHNIQUE: Following the noncontrasted scout, axial images of the thorax were  obtained following infusion of 100 cc of Isovue 370.  Post-processing on an  independent workstation was performed to reconstruct MIP images for evaluation  of the thoracic vasculature.    FINDINGS: There is no pulmonary embolism, aortic dissection, thoracic aneurysm  or pericardial fluid. 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    Discoid atelectasis in the lingula segment, right middle lobe and both lower  lobes is stable. The lung parenchyma is otherwise clear. No pleural effusion or  pneumothorax is noted. There is no axillary, mediastinal or hilar adenopathy.    Limited evaluation of the upper abdomen demonstrates no gross abnormalities.    Review of bone windows demonstrates no osteoblastic or lytic  lesions.    Impression  1.  There is no pulmonary embolism, aortic dissection, pericardial fluid or  thoracic aneurysm.  2.  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  3.  No acute lung findings. Discoid atelectasis in the lingula segment, right  middle lobe and both lower lobes is stable.        All CT scans at this facility are performed using low  dose modulation  techniques as appropriate to perform exam including the following: automated  exposure control; use of iterative reconstruction technique; adjustment of the  mA and/or kV according to patient size (this includes techniques or standardized  protocols for targeted exams where dose is matched to indication/reason for  exam).              Electronically signed by Georgiann Mccoy          ASSESSMENT:  Principal Problem:    Sickle cell crisis (HCC)  Active Problems:    Diffuse pain in left upper extremity    Drowsiness    Hypoxia  Resolved Problems:    * No resolved hospital problems. *    [name] Bright is a 47 y.o. female admitted on 06/12/2023. The primary encounter diagnosis was Sickle cell crisis (HCC). 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  She is a patient of Dr. Welton Flakes followed for sickle cell disease on folic acid 1mg  daily and Hydrea 1000mg  daily.  She presented to ED with c/o "pain all over" for the past four days.  She has been helping family move for the past 5 days.  Out of dilaudid at home.  She reports some chest discomfort and shortness of breath.  Denies cough or fever.  CXR neg.  UA pending.  RVP pending.  Hgb 7.3 (b/l ~7-8).  She is being admitted for further management of sickle cell crisis.      PLAN:  Sickle cell crisis  - con't folic acid, hydrea  - RVP pending.  CXR neg.  CTA chest  ordered.  - UA pending.  - D5 1/2 NS @ / hr  - No IV benadryl or IV phenergan  - Pain mgmt  7/11 Pain ongoing.  Toradol ordered.  Hgb down to 6.6.  Hold off on transfusion at this time.  UA not c/w UTI.  Hypoxia  7/10 On Airvo @ 60L.  ABG results reviewed.  CTA chest pending.  RVP pending.  Consult pulm.  7/11 Airvo down to 45L, sat 100%.  RVP neg.  CTA chest neg for acute findings; shows atelectasis.  Pulm consult pending.  IS ordered.    LUE pain  - LUE dopp ordered  7/11 LUE dopp neg    Hx SVC thrombosis / RUE DVT / PE  - con't Xarelto    Continue home meds  Supportive care  On Xarelto    Dispo:  Possible discharge home tomorrow after weaning O2.              Trisha Mangle, APRN - CNP   [facility]  [address]  [place] [address]  Office : [phone]  Fax : [phone]

## 2023-06-14 NOTE — ACP (Advance Care Planning) (Signed)
Advance Care Planning     General Advance Care Planning (ACP) Conversation    Date of Conversation: 06/12/2023  Conducted with: Patient with Decision Making Capacity    Healthcare Decision Maker:    Primary Decision Maker: [name] Bright, [name] Bright - Parent - [phone]  Click here to complete Healthcare Decision Makers including selection of the Healthcare Decision Maker Relationship (ie "Primary").  Today we documented Decision Maker(s) consistent with Legal Next of Kin hierarchy.    Content/Action Overview:  DECLINED Retail buyer - will revisit periodically  Reviewed DNR/DNI and patient elects Full Code (Attempt Resuscitation)    Length of Voluntary ACP Conversation in minutes:  <16 minutes (Non-Billable)    Pincus Large, RN

## 2023-06-14 NOTE — Other (Signed)
06/14/23 1152   Treatment Team Notification   Reason for Communication Review case  (Wean patient's AIRVO- per pulmonary; patient to be discharged tomorrow)   Name of Team Member Notified Hana- RT   Treatment Team Role Respiratory Therapist   Method of Communication Call   Response En route   Notification Time 1152

## 2023-06-14 NOTE — Other (Signed)
LOS 1d  IDR and chart reviewed for discharge planning.No needs identified at this time.    Transition of Care is home with family assit when medically stable.    Transitions of Care plan is ongoing, no further concerns as of present.   Please consult case manager if any new issues arise.  Will continue to follow.    ASSESSMENT NOTE    Attending Physician: Dennie Fetters, MD  Admit Problem: Sickle cell crisis (HCC) [D57.00]  Drowsiness [R40.0]  Hypoxia [R09.02]  Diffuse pain in left upper extremity [M79.602]  Date/Time of Admission: 06/12/2023 11:00 PM  Problem List:  Patient Active Problem List   Diagnosis    Hypercoagulable state (HCC)    Seizure disorder (HCC)    Sickle cell anemia with crisis (HCC)    Sickle cell disease (HCC)    CAP (community acquired pneumonia)    Acute chest wall pain    Atopic rhinitis    Gastroesophageal reflux disease    Depression    Superior vena cava occlusion (HCC)    History of DVT (deep vein thrombosis)    Menorrhagia    Abnormal uterine bleeding    Symptomatic anemia    Sickle cell pain crisis (HCC)    Acute sinusitis    Sickle cell crisis (HCC)    Migraines    History of seizure    Mastitis    Bug bite    Diffuse pain in left upper extremity    Drowsiness    Hypoxia        06/14/23 1019   Service Assessment   Patient Orientation Alert and Oriented   Cognition Alert   History Provided By Medical Record   Primary Caregiver Self   Accompanied By/Relationship n/a   Support Systems Parent;Family Members   Patient's Healthcare Decision Maker is: Legal Next of Kin   PCP Verified by CM Yes   Last Visit to PCP Within last 3 months   Prior Functional Level Independent in ADLs/IADLs   Current Functional Level Independent in ADLs/IADLs   Can patient return to prior living arrangement Unknown at present   Ability to make needs known: Good   Family able to assist with home care needs: Yes   Would you like for me to discuss the discharge plan with any other family members/significant others, and  if so, who? No   Financial Resources Medicare;Medicaid  Multimedia programmer Medicare/ Medicaid)   Community Resources None   CM/SW Referral Other (see comment)  (n/a)   Social/Functional History   Lives With Parent   Type of Home House   Home Layout One level   Armed forces technical officer None   Home Equipment None   ADL Assistance Independent   Occupational hygienist Yes   Mode of Engineer, water   Occupation On disability   Discharge Planning   Type of Residence House   Living Arrangements Family Members   Current Services Prior To Admission None   Potential Assistance Needed N/A   DME Ordered? No   Potential Assistance Purchasing Medications No   Type of Home Care Services None   Patient expects to be discharged to: House   One/Two Story Residence One story   History of falls? 0   Services At/After Discharge   Transition of Care Consult (CM Consult) Discharge Planning   Services At/After Discharge None   Veteran Resource Information Provided? No   Mode of Transport at  Discharge Other (see comment)  (family)   Confirm Follow Up Transport Family   Condition of Participation: Discharge Planning   The Plan for Transition of Care is related to the following treatment goals: Patient to reach baseline ADL's goals at time of discharge if not then provide appropriate next level of care options to obtain a safe transition of care   The Patient and/or Patient Representative was provided with a Choice of Provider? Patient   The Patient and/Or Patient Representative agree with the Discharge Plan? Yes   Freedom of Choice list was provided with basic dialogue that supports the patient's individualized plan of care/goals, treatment preferences, and shares the quality data associated with the providers?  Yes

## 2023-06-14 NOTE — Plan of Care (Signed)
Problem: Pain  Goal: Verbalizes/displays adequate comfort level or baseline comfort level  06/14/2023 1211 by Fransico Setters, RN  Outcome: Progressing  06/14/2023 0423 by Vernie Murders, RN  Outcome: Progressing     Problem: Safety - Adult  Goal: Free from fall injury  06/14/2023 1211 by Fransico Setters, RN  Outcome: Progressing  06/14/2023 0423 by Vernie Murders, RN  Outcome: Progressing

## 2023-06-14 NOTE — Consults (Addendum)
History and Physical Initial Visit NOTE           06/14/2023    [name] Bright                        Date of Admission:  06/12/2023    The patient's chart is reviewed and the patient is discussed with the staff.    Subjective:     Patient is a 47 y.o. African American female seen and evaluated at the request of Dr. Veneda Melter for hypoxia in setting of sickle cell crisis.  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 She is followed by Dr. Welton Flakes as outpatient and is on folic acid and hydrea daily. Denies history of asthma, sleep apnea, or any other lung disease. Never been a smoker. She states she has never been on high flow O2 when having a sickle cell crisis.     She came to ED on 7/19 with generalized pain x 4 days. She did admit to helping her mom move in last few days. She was out of Dilaudid at home, but also does not use it regularly. She also reported SOB. She was treated w Dilaudid in ED. Per ED pt had desaturations to 80s when asleep. When she was awake and walking around, O2 was normal.     A blood gas was drawn yesterday and showed pH 7.25, pCO2 60.9, pO2 19, HCO3 26.4, suspect this was a venous gas rather than arterial due to profoundly low pO2. No documented fevers, RVP negative, UA showed 1+ bacteria, negative leukocytes. She was placed on Airvo on arrival. States she has been around her Mom who had pneumonia recently. Days ago she states she had a cough with yellow/green sputum but none since arriving to the hospital. CTA chest was ordered and PE was ruled out. Does have some atelectasis in LLL but no evidence of pneumonia. LUE doppler negative for DVT (checked due to pain in LUE). She is getting IVF. Hgb resulted at 6.6 today and is being transfused by Oncology team. While at rest, O2 sats on 50%/45L Airvo was 100%. Nursing states she desaturates below 90% when ambulating.    Review  of Systems: Comprehensive ROS negative except in HPI    Current Outpatient Medications   Medication Instructions    diphenhydrAMINE (SOMINEX) 25 mg    diphenhydrAMINE-APAP, sleep, (TYLENOL PM EXTRA STRENGTH) 25-500 MG tablet 1 tablet, DAILY    fexofenadine (ALLEGRA) 180 mg, DAILY    FLUoxetine (PROZAC) 40 mg, Oral, DAILY    fluticasone (FLONASE) 50 MCG/ACT nasal spray 2 sprays, Each Nostril, DAILY    folic acid (FOLVITE) 1 mg, Oral, DAILY    HYDROcodone-acetaminophen (NORCO) 10-325 MG per tablet 1 tablet, Oral, EVERY 6 HOURS PRN    hydroxyurea (HYDREA) 1,000 mg, Oral, DAILY    levETIRAcetam (KEPPRA) 1,500 mg, Oral, 2 TIMES DAILY    naloxone 4 MG/0.1ML LIQD nasal spray 1 spray, Nasal, PRN    ondansetron (ZOFRAN ODT) 4-8 mg, SubLINGual, EVERY 6-8 HOURS PRN    rivaroxaban (XARELTO) 20 mg, Oral, DAILY WITH BREAKFAST    vitamin D (ERGOCALCIFEROL) 50,000 Units, Oral, DAILY    vitamin D (ERGOCALCIFEROL) 50,000 Units, Oral, DAILY      Past Medical History:   Diagnosis Date    Arthritis     Hx of blood clots     Migraines  Seizures (HCC)     Sickle cell disease (HCC)     Stroke [facility])      Past Surgical History:   Procedure Laterality Date    CESAREAN SECTION      CHOLECYSTECTOMY      LAPAROTOMY      TUBAL LIGATION      VASCULAR SURGERY       Social History     Socioeconomic History    Marital status: Single     Spouse name: Not on file    Number of children: Not on file    Years of education: Not on file    Highest education level: Not on file   Occupational History    Not on file   Tobacco Use    Smoking status: Never     Passive exposure: Never    Smokeless tobacco: Never   Substance and Sexual Activity    Alcohol use: Not Currently    Drug use: Never    Sexual activity: Not Currently   Other Topics Concern    Not on file   Social History Narrative    Not on file     Social Determinants of Health     Financial Resource Strain: Not on file   Food Insecurity: No Food Insecurity (06/13/2023)    Hunger Vital Sign     Worried  About Running Out of Food in the Last Year: Never true     Ran Out of Food in the Last Year: Never true   Transportation Needs: No Transportation Needs (06/13/2023)    PRAPARE - Therapist, art (Medical): No     Lack of Transportation (Non-Medical): No   Physical Activity: Not on file   Stress: Not on file   Social Connections: Not on file   Intimate Partner Violence: Not on file   Housing Stability: Low Risk  (06/13/2023)    Housing Stability Vital Sign     Unable to Pay for Housing in the Last Year: No     Number of Places Lived in the Last Year: 1     Unstable Housing in the Last Year: No     Family History   Problem Relation Age of Onset    Diabetes Mother     Hypertension Mother     Diabetes Father     Hypertension Father      Allergies   Allergen Reactions    Latex Rash    Ceftriaxone Other (See Comments)    Fentanyl Other (See Comments)     "my doctor said I had a stroke from it"    Influenza Vaccines Itching     unknown      Influenza Virus Vaccine Swelling    Meperidine Other (See Comments)    Morphine Itching    Oxycodone Other (See Comments)    Sulfa Antibiotics Rash     Objective:   Blood pressure 114/72, pulse 71, temperature 97.7 F (36.5 C), temperature source Oral, resp. rate 20, height 1.727 m (5\' 8" ), weight 74.4 kg (164 lb), SpO2 99 %.   Intake/Output Summary (Last 24 hours) at 06/14/2023 1610  Last data filed at 06/14/2023 0846  Gross per 24 hour   Intake 443.94 ml   Output 600 ml   Net -156.06 ml     PHYSICAL EXAM   Constitutional:  the patient is well developed and in no acute distress  EENMT:  Sclera clear, pupils equal, oral mucosa moist  Respiratory: symmetric chest rise. Clear throughout; sat 100% on airvo 50%/45L  Cardiovascular:  RRR without M,G,R. There is no lower extremity edema.  Gastrointestinal: soft and non-tender; with positive bowel sounds.  Musculoskeletal: warm without cyanosis. Normal muscle tone.   Skin:  no jaundice or rashes  Neurologic: symmetric  strength, fluent speech  Psychiatric:  calm, appropriate, oriented x 4    Imaging: I performed an independent interpretation of the patient's images.  CXR:    7/10      CT Chest:   7/13 May 2022      Recent Labs     06/12/23  0049 06/14/23  0553   WBC 5.1 5.6   HGB 7.3* 6.6*   HCT 20.8* 19.4*   PLT 476* 381   NA 138 136   K 4.3 3.8   CL 104 102   CO2 22 21   BUN 13 9   CREATININE 1.16* 1.10   MG  --  1.9   BILITOT 1.0 0.7   AST 28 26   ALT 9* <5*   ALKPHOS 117* 99       Lab Results   Component Value Date/Time    NA 136 06/14/2023 05:53 AM    K 3.8 06/14/2023 05:53 AM    CL 102 06/14/2023 05:53 AM    CO2 21 06/14/2023 05:53 AM    BUN 9 06/14/2023 05:53 AM    CREATININE 1.10 06/14/2023 05:53 AM    GLUCOSE 117 06/14/2023 05:53 AM    CALCIUM 9.1 06/14/2023 05:53 AM      Lab Results   Component Value Date    BNP 210 (H) 07/16/2020       ECHO: No results found for this or any previous visit.    MICRO: No results for input(s): "CULTURE" in the last 72 hours.  Recent Labs     06/13/23  1257   COVID19 NOT DETECTED     Assessment and Plan:  (Medical Decision Making)   Impression: 47 y.o female admitted with sickle cell crisis and hypoxia. Had witnessed desaturations in ER while sleeping but saturations adequate while awake and ambulating. Now on Airvo 50%/45L with sat 100% at rest but nursing reports desaturations below 90% when getting up. Receiving pain management per oncology, IVF, and blood transfusion for Hgb 6.6     Principal Problem:    Sickle cell crisis (HCC)  Plan: managed by Oncology, receiving IVF, getting transfused for Hgb 6.6; pain management per cardiology     Active Problems:    Diffuse pain in left upper extremity  Plan: negative for DVT; Oncology managing pain     Hypoxia  Plan: sats are 100% while at rest on Airvo 50%/45L. Nursing reports she is desaturating below 90% when getting out of bed. She endorses dyspnea on exertion. Did have productive cough days ago but none currently. Will be transfused  today. Will add incentive spirometer in setting of atelectasis.   Will need ECHO with bubble study to check for shunting.   Also needs persistent ambulation      Full Code    Thank you very much for this referral.  We appreciate the opportunity to participate in this patient's care.  Will follow along with above stated plan.    In this split/shared evaluation I performed performed a medically appropriate history and exam, counseled and educated the patient and/or family member, ordered medications, tests or procedures, documented information in EMR, and coordinated care. which  accounted for 18 minutes clinical time.     Amy Elson Clan, APRN - NP    In this split/shared evaluation I performed reviewed the patients's H&P, available images, labs, cultures., discussed case in detail with NPP, performed a medically appropriate history and exam, counseled and educated the patient and/or family member, ordered and/or reviewed medications, tests or procedures, documented information in EMR, independently interpreted images, and coordinated care. which accounted for 50 minutes clinical time.     Impression: 47 y.o female admitted with sickle cell crisis and hypoxia. Had witnessed desaturations in ER while sleeping but saturations adequate while awake and ambulating. Now on Airvo 50%/45L with sat 100% at rest but nursing reports desaturations below 90% when getting up. Receiving pain management per oncology, IVF, and blood transfusion for Hgb 6.6   Desaturations were after 2 doses of dilaudiud and ABG c/w pure respiratory acidosis and likely thus an opoid effect.  No orthodezoxia on exam, I doubt shunting, also CT with pathy bilateral atelectasis with no overt parenchymal infiltrates.  Recommend wean off O2 as tolerated.  Mobilize and use IS as frequently as tolerated, avoid oversedation.  Discussed with Dr Daphene Calamity, MD

## 2023-06-14 NOTE — Progress Notes (Signed)
59- Patient signed blood consent form and witnessed by this nurse

## 2023-06-14 NOTE — Progress Notes (Addendum)
1915: Patient with O2 sating in the high 80s-low 90s. Patient placed on 2L NC and came up to 94 %. Will continue to monitor and attempt to wean O2 again.     2120: Patient sating high 90s on 2L NC, patient placed on RA.    2140: Patient sating in the mid-high 80s. Patient placed back on 1L     0527: Patient weaned to RA sating 93%    0600: Patient sating in high 29s. Patient placed on 0.5L NC

## 2023-06-15 ENCOUNTER — Inpatient Hospital Stay: Admit: 2023-06-15 | Payer: MEDICARE | Primary: Student in an Organized Health Care Education/Training Program

## 2023-06-15 LAB — COMPREHENSIVE METABOLIC PANEL
ALT: 18 U/L (ref 12–65)
AST: 48 U/L — ABNORMAL HIGH (ref 15–37)
Albumin/Globulin Ratio: 0.9 — ABNORMAL LOW (ref 1.0–1.9)
Albumin: 3.4 g/dL — ABNORMAL LOW (ref 3.5–5.0)
Alk Phosphatase: 105 U/L — ABNORMAL HIGH (ref 35–104)
Anion Gap: 10 mmol/L (ref 9–18)
BUN: 10 MG/DL (ref 6–23)
CO2: 22 mmol/L (ref 20–28)
Calcium: 8.8 MG/DL (ref 8.8–10.2)
Chloride: 105 mmol/L (ref 98–107)
Creatinine: 1.03 MG/DL (ref 0.60–1.10)
Est, Glom Filt Rate: 67 mL/min/{1.73_m2} (ref >60)
Globulin: 4 g/dL — ABNORMAL HIGH (ref 2.3–3.5)
Glucose: 130 mg/dL — ABNORMAL HIGH (ref 70–99)
Potassium: 4.1 mmol/L (ref 3.5–5.1)
Sodium: 137 mmol/L (ref 136–145)
Total Bilirubin: 0.9 MG/DL (ref 0.0–1.2)
Total Protein: 7.4 g/dL (ref 6.3–8.2)

## 2023-06-15 LAB — CBC WITH AUTO DIFFERENTIAL
Basophils %: 0 % (ref 0.0–2.0)
Basophils Absolute: 0 10*3/uL (ref 0.0–0.2)
Eosinophils %: 1 % (ref 0.5–7.8)
Eosinophils Absolute: 0 10*3/uL (ref 0.0–0.8)
Hematocrit: 17.4 % — ABNORMAL LOW (ref 35.8–46.3)
Hemoglobin: 6 g/dL — CL (ref 11.7–15.4)
Immature Granulocytes %: 1 % (ref 0.0–5.0)
Immature Granulocytes Absolute: 0 10*3/uL (ref 0.0–0.5)
Lymphocytes %: 14 % (ref 13–44)
Lymphocytes Absolute: 0.7 10*3/uL (ref 0.5–4.6)
MCH: 45.1 PG — ABNORMAL HIGH (ref 26.1–32.9)
MCHC: 34.5 g/dL (ref 31.4–35.0)
MCV: 130.8 FL — ABNORMAL HIGH (ref 82–102)
MPV: 9.5 FL (ref 9.4–12.3)
Monocytes %: 10 % (ref 4.0–12.0)
Monocytes Absolute: 0.5 10*3/uL (ref 0.1–1.3)
Neutrophils %: 74 % (ref 43–78)
Neutrophils Absolute: 3.6 10*3/uL (ref 1.7–8.2)
Platelet Comment: ADEQUATE
Platelets: 317 10*3/uL (ref 150–450)
RBC: 1.33 M/uL — ABNORMAL LOW (ref 4.05–5.2)
RDW: 17.2 % — ABNORMAL HIGH (ref 11.9–14.6)
WBC: 4.8 10*3/uL (ref 4.3–11.1)
nRBC: 1.29 10*3/uL — ABNORMAL HIGH (ref 0.0–0.2)

## 2023-06-15 LAB — RETICULOCYTES
Absolute Retic #: 0.069 M/ul (ref 0.026–0.095)
Immature Retic Fraction: 26.3 % — ABNORMAL HIGH (ref 3.0–15.9)
Retic Hemoglobin conc.: 31 pg (ref 29–35)
Reticulocyte Count,Automated: 5.2 % — ABNORMAL HIGH (ref 0.3–2.0)

## 2023-06-15 LAB — MAGNESIUM: Magnesium: 1.8 mg/dL (ref 1.8–2.4)

## 2023-06-15 LAB — PREPARE RBC (CROSSMATCH)

## 2023-06-15 MED ORDER — DIPHENHYDRAMINE HCL 25 MG PO CAPS
25 | ORAL | Status: DC
Start: 2023-06-15 — End: 2023-06-20
  Administered 2023-06-15 – 2023-06-19 (×2): 25 mg via ORAL

## 2023-06-15 MED ORDER — SODIUM CHLORIDE 0.9 % IV SOLN
0.9 | INTRAVENOUS | Status: DC | PRN
Start: 2023-06-15 — End: 2023-06-19

## 2023-06-15 MED ORDER — ACETAMINOPHEN 325 MG PO TABS
325 | ORAL | Status: DC
Start: 2023-06-15 — End: 2023-06-20
  Administered 2023-06-15: 13:00:00 650 mg via ORAL

## 2023-06-15 MED FILL — XARELTO 20 MG PO TABS: 20 MG | ORAL | Qty: 1

## 2023-06-15 MED FILL — ACETAMINOPHEN 325 MG PO TABS: 325 MG | ORAL | Qty: 2

## 2023-06-15 MED FILL — FOLIC ACID 1 MG PO TABS: 1 MG | ORAL | Qty: 1

## 2023-06-15 MED FILL — CETIRIZINE HCL 10 MG PO TABS: 10 MG | ORAL | Qty: 1

## 2023-06-15 MED FILL — LEVETIRACETAM 500 MG PO TABS: 500 MG | ORAL | Qty: 3

## 2023-06-15 MED FILL — KETOROLAC TROMETHAMINE 30 MG/ML IJ SOLN: 30 MG/ML | INTRAMUSCULAR | Qty: 1

## 2023-06-15 MED FILL — HYDROMORPHONE HCL 1 MG/ML IJ SOLN: 1 MG/ML | INTRAMUSCULAR | Qty: 1

## 2023-06-15 MED FILL — FLUOXETINE HCL 10 MG PO CAPS: 10 MG | ORAL | Qty: 2

## 2023-06-15 MED FILL — HYDROMORPHONE HCL 2 MG PO TABS: 2 MG | ORAL | Qty: 1

## 2023-06-15 MED FILL — HYDREA 500 MG PO CAPS: 500 MG | ORAL | Qty: 2

## 2023-06-15 MED FILL — DIPHENHYDRAMINE HCL 25 MG PO CAPS: 25 MG | ORAL | Qty: 1

## 2023-06-15 NOTE — Progress Notes (Addendum)
Attending Addendum:  Patient seen with NP.  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  She was seen in ED with pain/sickle cell crisis.  We were asked to admit as pt got opiod therapy for crisis and developed hypoxia when sleeping.  She was admitted on 7/9.  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  She is on folic acid and hydroxyurea.  She came to ED w pain all over.  She did admit to helping her mom move in last few days.  She was out of Dilaudid at home, but also does not use it regularly.  She reported SOB.  She was treated w Dilaudid in ED.  Per ED pt with desaturations to 80s when asleep.  When she is awake and walking around O2 was normal.  We were asked to admit for close monitoring until resolution.  No reported fevers/cough.  CXR neg.  UA neg.  RVP neg.  Hb 6.6 w IVF with bl 7-8.  C/w folate/hydrohyurea.  CTA chest w atelectasis, no PE/PNA.  C/w IVF and pain management as needed.  C/w continuous pulseox.  LUE pain - doppler w no DVT.  Has hx of SVC thrombosis/VTE - c/w DOAC.  Appreciate pulm consult - rec ambulation/IS and tapering O2.  Pain ongoing, but feels it is controlled on current regimen.  Did get IV Dilaudid in last 24hrs.  Labs reviewed and Hb down to 6 - will transfuse today 1u PRBC.  C/o pain and neck edema - will check [place].  Plan to repeat CXR today - pt on IVF and reported cough days prior to admission/sick contact.      I personally performed a face to face diagnostic evaluation on this patient.  My findings are as follows: A&ox3, NC in place, pt looks comfortable, lungs clear, heart regular, abdomen benign and no LE edema, pt tender over left UE, collateral vessels on exam over chest and abdomen.          I reviewed the current findings, plan and labs/imaging with the pt.            Philis Pique, MD  [facility]  [address]  [place], [place] [address]  Office :  [phone]  Fax : [phone]        [facility]        Inpatient Hematology / Oncology Progress Note    Reason for Admission:  Sickle cell crisis (HCC) [D57.00]  Drowsiness [R40.0]  Hypoxia [R09.02]  Diffuse pain in left upper extremity [M79.602]    24 Hour Events:  Afebrile, VSS, on O2 @ 1L  Pain ongoing, but controlled on current regimen  Hgb down to 6.0  C/o b/l neck edema    Transfusions: 1 unit PRBCs  Replacements: None      ROS:  Constitutional: Negative for fever, chills.  CV: Negative for chest pain, palpitations, edema.  Respiratory: +cough. Negative for wheezing.  GI: +nausea. Negative for abdominal pain, diarrhea.    10 point review of systems is otherwise negative with the exception of the elements mentioned above in the HPI.           Allergies   Allergen Reactions    Latex Rash    Ceftriaxone Other (See Comments)    Fentanyl Other (See Comments)     "my doctor said I had a stroke from  it"    Influenza Vaccines Itching     unknown      Influenza Virus Vaccine Swelling    Meperidine Other (See Comments)    Morphine Itching    Oxycodone Other (See Comments)    Sulfa Antibiotics Rash     Past Medical History:   Diagnosis Date    Arthritis     Hx of blood clots     Migraines     Seizures (HCC)     Sickle cell disease (HCC)     Stroke [facility])      Past Surgical History:   Procedure Laterality Date    CESAREAN SECTION      CHOLECYSTECTOMY      LAPAROTOMY      TUBAL LIGATION      VASCULAR SURGERY       Family History   Problem Relation Age of Onset    Diabetes Mother     Hypertension Mother     Diabetes Father     Hypertension Father      Social History     Socioeconomic History    Marital status: Single     Spouse name: Not on file    Number of children: Not on file    Years of education: Not on file    Highest education level: Not on file   Occupational History    Not on file   Tobacco Use    Smoking status: Never     Passive exposure: Never    Smokeless tobacco: Never   Substance  and Sexual Activity    Alcohol use: Not Currently    Drug use: Never    Sexual activity: Not Currently   Other Topics Concern    Not on file   Social History Narrative    Not on file     Social Determinants of Health     Financial Resource Strain: Not on file   Food Insecurity: No Food Insecurity (06/13/2023)    Hunger Vital Sign     Worried About Running Out of Food in the Last Year: Never true     Ran Out of Food in the Last Year: Never true   Transportation Needs: No Transportation Needs (06/13/2023)    PRAPARE - Therapist, art (Medical): No     Lack of Transportation (Non-Medical): No   Physical Activity: Not on file   Stress: Not on file   Social Connections: Not on file   Intimate Partner Violence: Not on file   Housing Stability: Low Risk  (06/13/2023)    Housing Stability Vital Sign     Unable to Pay for Housing in the Last Year: No     Number of Places Lived in the Last Year: 1     Unstable Housing in the Last Year: No     Current Facility-Administered Medications   Medication Dose Route Frequency Provider Last Rate Last Admin    0.9 % sodium chloride infusion   IntraVENous PRN Trisha Mangle, APRN - CNP        acetaminophen (TYLENOL) tablet 650 mg  650 mg Oral See Admin Instructions Trisha Mangle, APRN - CNP   650 mg at 06/15/23 0906    diphenhydrAMINE (BENADRYL) capsule 25 mg  25 mg Oral See Admin Instructions Trisha Mangle, APRN - CNP   25 mg at 06/15/23 0908    ketorolac (TORADOL) injection 30 mg  30 mg IntraVENous Q6H Trisha Mangle, APRN - CNP  30 mg at 06/15/23 0910    cetirizine (ZYRTEC) tablet 10 mg  10 mg Oral Daily Trisha Mangle, APRN - CNP   10 mg at 06/15/23 0907    FLUoxetine (PROZAC) capsule 40 mg  40 mg Oral Daily Trisha Mangle, APRN - CNP   40 mg at 06/15/23 4098    folic acid (FOLVITE) tablet 1 mg  1 mg Oral Daily Trisha Mangle, APRN - CNP   1 mg at 06/15/23 1191    hydroxyurea (HYDREA) chemo capsule 1,000 mg  1,000 mg Oral Daily Trisha Mangle, APRN - CNP   1,000 mg at 06/15/23 4782     levETIRAcetam (KEPPRA) tablet 1,500 mg  1,500 mg Oral BID Trisha Mangle, APRN - CNP   1,500 mg at 06/15/23 0908    rivaroxaban (XARELTO) tablet 20 mg  20 mg Oral Daily with breakfast Trisha Mangle, APRN - CNP   20 mg at 06/15/23 9562    sodium chloride flush 0.9 % injection 5-40 mL  5-40 mL IntraVENous 2 times per day Trisha Mangle, APRN - CNP   10 mL at 06/15/23 0912    sodium chloride flush 0.9 % injection 5-40 mL  5-40 mL IntraVENous PRN Trisha Mangle, APRN - CNP        0.9 % sodium chloride infusion   IntraVENous PRN Trisha Mangle, APRN - CNP        polyethylene glycol (GLYCOLAX) packet 17 g  17 g Oral Daily PRN Trisha Mangle, APRN - CNP        dextrose 5 % and 0.45 % sodium chloride infusion   IntraVENous Continuous Trisha Mangle, APRN - CNP 125 mL/hr at 06/15/23 0309 New Bag at 06/15/23 0309    acetaminophen (TYLENOL) tablet 650 mg  650 mg Oral Q6H PRN Trisha Mangle, APRN - CNP   650 mg at 06/14/23 0835    ondansetron (ZOFRAN-ODT) disintegrating tablet 4 mg  4 mg Oral Q6H PRN Trisha Mangle, APRN - CNP   4 mg at 06/13/23 1727    Or    ondansetron (ZOFRAN) injection 4 mg  4 mg IntraVENous Q6H PRN Trisha Mangle, APRN - CNP        prochlorperazine (COMPAZINE) tablet 10 mg  10 mg Oral Q6H PRN Trisha Mangle, APRN - CNP        Or    prochlorperazine (COMPAZINE) injection 10 mg  10 mg IntraVENous Q6H PRN Trisha Mangle, APRN - CNP        HYDROmorphone HCl PF (DILAUDID) injection 1 mg  1 mg IntraVENous Q3H PRN Trisha Mangle, APRN - CNP   1 mg at 06/15/23 0304    HYDROmorphone (DILAUDID) tablet 2 mg  2 mg Oral Q6H PRN Trisha Mangle, APRN - CNP   2 mg at 06/15/23 0156    naloxone (NARCAN) injection 0.4 mg  0.4 mg IntraVENous PRN Trisha Mangle, APRN - CNP           OBJECTIVE:  Patient Vitals for the past 8 hrs:   BP Temp Temp src Pulse Resp SpO2   06/15/23 1058 104/65 98.8 F (37.1 C) Oral 77 18 96 %   06/15/23 1042 103/64 98.2 F (36.8 C) -- 84 17 96 %   06/15/23 0753 -- -- -- 82 16 96 %   06/15/23 0751 (!) 96/59 98.4 F (36.9 C) Oral 83 18 95 %    06/15/23 0600 -- -- -- -- -- 94 %   06/15/23 0515 -- -- -- -- -- 93 %  Temp (24hrs), Avg:98.2 F (36.8 C), Min:97.7 F (36.5 C), Max:98.8 F (37.1 C)    07/12 0701 - 07/12 1900  In: -   Out: 600 [Urine:600]    Physical Exam:  Constitutional: Acutely ill-appearing female in no acute distress, sitting comfortably in the hospital bed.    HEENT: Normocephalic and atraumatic. Oropharynx is clear, mucous membranes are moist.  Extraocular muscles are intact.  Sclerae anicteric. Neck supple without JVD. No thyromegaly present.    Skin Warm and dry.  No bruising and no rash noted.  No erythema.  No pallor.    Neuro Grossly nonfocal with no obvious sensory or motor deficits.   MSK Normal range of motion in general.     Psych Appropriate mood and affect.    Full exam per attending MD    Labs:    Recent Results (from the past 24 hour(s))   Comprehensive Metabolic Panel    Collection Time: 06/15/23  5:34 AM   Result Value Ref Range    Sodium 137 136 - 145 mmol/L    Potassium 4.1 3.5 - 5.1 mmol/L    Chloride 105 98 - 107 mmol/L    CO2 22 20 - 28 mmol/L    Anion Gap 10 9 - 18 mmol/L    Glucose 130 (H) 70 - 99 mg/dL    BUN 10 6 - 23 MG/DL    Creatinine 5.28 4.13 - 1.10 MG/DL    Est, Glom Filt Rate 67 >60 ml/min/1.59m2    Calcium 8.8 8.8 - 10.2 MG/DL    Total Bilirubin 0.9 0.0 - 1.2 MG/DL    ALT 18 12 - 65 U/L    AST 48 (H) 15 - 37 U/L    Alk Phosphatase 105 (H) 35 - 104 U/L    Total Protein 7.4 6.3 - 8.2 g/dL    Albumin 3.4 (L) 3.5 - 5.0 g/dL    Globulin 4.0 (H) 2.3 - 3.5 g/dL    Albumin/Globulin Ratio 0.9 (L) 1.0 - 1.9     Magnesium    Collection Time: 06/15/23  5:34 AM   Result Value Ref Range    Magnesium 1.8 1.8 - 2.4 mg/dL   CBC with Auto Differential    Collection Time: 06/15/23  5:34 AM   Result Value Ref Range    WBC 4.8 4.3 - 11.1 K/uL    RBC 1.33 (L) 4.05 - 5.2 M/uL    Hemoglobin 6.0 (LL) 11.7 - 15.4 g/dL    Hematocrit 24.4 (L) 35.8 - 46.3 %    MCV 130.8 (H) 82 - 102 FL    MCH 45.1 (H) 26.1 - 32.9 PG    MCHC 34.5  31.4 - 35.0 g/dL    RDW 01.0 (H) 27.2 - 14.6 %    Platelets 317 150 - 450 K/uL    MPV 9.5 9.4 - 12.3 FL    nRBC 1.29 (H) 0.0 - 0.2 K/uL    Neutrophils % 74 43 - 78 %    Lymphocytes % 14 13 - 44 %    Monocytes % 10 4.0 - 12.0 %    Eosinophils % 1 0.5 - 7.8 %    Basophils % 0 0.0 - 2.0 %    Immature Granulocytes % 1 0.0 - 5.0 %    Neutrophils Absolute 3.6 1.7 - 8.2 K/UL    Lymphocytes Absolute 0.7 0.5 - 4.6 K/UL    Monocytes Absolute 0.5 0.1 - 1.3 K/UL    Eosinophils Absolute 0.0 0.0 -  0.8 K/UL    Basophils Absolute 0.0 0.0 - 0.2 K/UL    Immature Granulocytes Absolute 0.0 0.0 - 0.5 K/UL    RBC Comment MARKED  ANISOCYTOSIS + POIKILOCYTOSIS        RBC Comment MODERATE  SCHISTOCYTES        RBC Comment MODERATE  SICKLE CELLS        RBC Comment MODERATE  POLYCHROMASIA        WBC Comment Result Confirmed By Smear      Platelet Comment ADEQUATE      Differential Type AUTOMATED     Reticulocytes    Collection Time: 06/15/23  5:34 AM   Result Value Ref Range    Reticulocyte Count,Automated 5.2 (H) 0.3 - 2.0 %    Absolute Retic # 0.0690 0.026 - 0.095 M/ul    Immature Retic Fraction 26.3 (H) 3.0 - 15.9 %    Retic Hemoglobin conc. 31 29 - 35 pg   PREPARE RBC (CROSSMATCH), 1 Units    Collection Time: 06/15/23  7:45 AM   Result Value Ref Range    History Check Historical check performed        Imaging:  Xray Result (most recent):  XR CHEST STANDARD TWO VW 06/12/2023    Narrative  Chest X-ray    INDICATION: Chest pain    COMPARISON: May 27, 2022 TECHNIQUE: PA and lateral views of the chest were  obtained.    FINDINGS: The lungs are clear. There are no infiltrates or effusions.  The heart  size is normal.  The bony thorax is intact.    Impression  No acute findings in the chest      Electronically signed by Berdine Addison    CT Result (most recent):  CT CHEST PULMONARY EMBOLISM W CONTRAST 06/13/2023    Narrative  CT CHEST PULMONARY EMBOLISM W CONTRAST    HISTORY:hypoxia, hx of PE, sickle cell crisis.    COMPARISON:  05/27/2022    TECHNIQUE: Following the noncontrasted scout, axial images of the thorax were  obtained following infusion of 100 cc of Isovue 370.  Post-processing on an  independent workstation was performed to reconstruct MIP images for evaluation  of the thoracic vasculature.    FINDINGS: There is no pulmonary embolism, aortic dissection, thoracic aneurysm  or pericardial fluid. 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    Discoid atelectasis in the lingula segment, right middle lobe and both lower  lobes is stable. The lung parenchyma is otherwise clear. No pleural effusion or  pneumothorax is noted. There is no axillary, mediastinal or hilar adenopathy.    Limited evaluation of the upper abdomen demonstrates no gross abnormalities.    Review of bone windows demonstrates no osteoblastic or lytic lesions.    Impression  1.  There is no pulmonary embolism, aortic dissection, pericardial fluid or  thoracic aneurysm.  2.  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  3.  No acute lung findings. Discoid atelectasis in the lingula segment, right  middle lobe and both lower lobes is stable.        All CT scans at this facility are performed using low  dose modulation  techniques as appropriate to perform exam including the following: automated  exposure control; use of iterative reconstruction technique; adjustment of the  mA and/or kV according to patient size (this includes techniques or standardized  protocols for targeted exams where dose is matched to  indication/reason for  exam).              Electronically signed by Georgiann Mccoy          ASSESSMENT:  Principal Problem:    Sickle cell crisis (HCC)  Active Problems:    Diffuse pain in left upper extremity    Drowsiness    Hypoxia  Resolved Problems:    * No resolved hospital problems. *    [name] Bright is a 47 y.o. female admitted on 06/12/2023. The primary  encounter diagnosis was Sickle cell crisis (HCC). 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  She is a patient of Dr. Welton Flakes followed for sickle cell disease on folic acid 1mg  daily and Hydrea 1000mg  daily.  She presented to ED with c/o "pain all over" for the past four days.  She has been helping family move for the past 5 days.  Out of dilaudid at home.  She reports some chest discomfort and shortness of breath.  Denies cough or fever.  CXR neg.  UA pending.  RVP pending.  Hgb 7.3 (b/l ~7-8).  She is being admitted for further management of sickle cell crisis.      PLAN:  Sickle cell crisis  - con't folic acid, hydrea  - RVP pending.  CXR neg.  CTA chest ordered.  - UA pending.  - D5 1/2 NS @ / hr  - No IV benadryl or IV phenergan  - Pain mgmt  7/11 Pain ongoing.  Toradol ordered.  Hgb down to 6.6.  Hold off on transfusion at this time.  UA not c/w UTI.    7/12 Pain ongoing, but controlled on currently regimen.  On toradol.  Requiring IV dilaudid.  Hgb down to 6.0.  Transfuse 1 unit PRBCs.    Hypoxia  7/10 On Airvo @ 60L.  ABG results reviewed.  CTA chest pending.  RVP pending.  Consult pulm.  7/11 Airvo down to 45L, sat 100%.  RVP neg.  CTA chest neg for acute findings; shows atelectasis.  Pulm consult pending.  IS ordered.  7/12 On O2 @ 1L.  O2 qualifier ordered.  Pulm following.  Repeat CXR.    LUE pain  - LUE dopp ordered  7/11 LUE dopp neg    Hx SVC thrombosis / RUE DVT / PE  - con't Xarelto    Bilateral neck edema  - Dopp ordered    Continue home meds  Supportive care  On Xarelto    Dispo:  TBD pending clinical course.  PT eval pending.              Trisha Mangle, APRN - CNP   [facility]  [address]  [place] [address]  Office : [phone]  Fax : [phone]

## 2023-06-15 NOTE — Other (Signed)
Informed Consent for Blood Component Transfusion Note    I have discussed with the patient the rationale for blood component transfusion; its benefits in treating or preventing fatigue, organ damage, or death; and its risk which includes mild transfusion reactions, rare risk of blood borne infection, or more serious but rare reactions. I have discussed the alternatives to transfusion, including the risk and consequences of not receiving transfusion. The patient had an opportunity to ask questions and had agreed to proceed with transfusion of blood components.    Electronically signed by Trisha Mangle, APRN - CNP on 06/15/23 at 7:31 AM EDT

## 2023-06-15 NOTE — Care Coordination-Inpatient (Signed)
LOS 2  IDR and chart reviewed for transition of care.   50%/45L Airvo was 100%. Nursing states she desaturates below 90% when ambulating.   PT/OT eval pending    Transition of care is pending PT/OT evals. Patient may need home 02 at discharge.    Transitions of Care plan is ongoing, no further concerns as of present.   Please consult case manager if any new issues arise.  Will continue to follow.

## 2023-06-15 NOTE — Progress Notes (Signed)
Order received for PT.  Currently getting blood.  Will check back in a few hours as time allows.  Esai Stecklein L Zanayah Shadowens, PT

## 2023-06-15 NOTE — Progress Notes (Addendum)
[name] Bright  Admission Date: 06/12/2023         Daily Progress Note: 06/15/2023    The patient's chart is reviewed and the patient is discussed with the staff.    Background:  47 y.o. African American female seen and evaluated at the request of Dr. Veneda Melter for hypoxia in setting of sickle cell crisis.  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 She is followed by Dr. Welton Flakes as outpatient and is on folic acid and hydrea daily. Denies history of asthma, sleep apnea, or any other lung disease. Never been a smoker. She states she has never been on high flow O2 when having a sickle cell crisis.      She came to ED on 7/19 with generalized pain x 4 days. She did admit to helping her mom move in last few days. She was out of Dilaudid at home, but also does not use it regularly. She also reported SOB. She was treated w Dilaudid in ED. Per ED pt had desaturations to 80s when asleep. When she was awake and walking around, O2 was normal.      A blood gas was drawn yesterday and showed pH 7.25, pCO2 60.9, pO2 19, HCO3 26.4, suspect this was a venous gas rather than arterial due to profoundly low pO2. No documented fevers, RVP negative, UA showed 1+ bacteria, negative leukocytes. She was placed on Airvo on arrival. States she has been around her Mom who had pneumonia recently. Days ago she states she had a cough with yellow/green sputum but none since arriving to the hospital. CTA chest was ordered and PE was ruled out. Does have some atelectasis in LLL but no evidence of pneumonia. LUE doppler negative for DVT (checked due to pain in LUE). She is getting IVF. Hgb resulted at 6.6 today and is being transfused by Oncology team. While at rest, O2 sats on 50%/45L Airvo was 100%. Nursing states she desaturates below 90% when ambulating.    Subjective:     Still having pain but some improvement. Oxygen weaned  to 1 liter. Denies dyspnea.     Current Facility-Administered Medications   Medication Dose Route Frequency    0.9 % sodium chloride infusion   IntraVENous PRN    acetaminophen (TYLENOL) tablet 650 mg  650 mg Oral See Admin Instructions    diphenhydrAMINE (BENADRYL) capsule 25 mg  25 mg Oral See Admin Instructions    ketorolac (TORADOL) injection 30 mg  30 mg IntraVENous Q6H    cetirizine (ZYRTEC) tablet 10 mg  10 mg Oral Daily    FLUoxetine (PROZAC) capsule 40 mg  40 mg Oral Daily    folic acid (FOLVITE) tablet 1 mg  1 mg Oral Daily    hydroxyurea (HYDREA) chemo capsule 1,000 mg  1,000 mg Oral Daily    levETIRAcetam (KEPPRA) tablet 1,500 mg  1,500 mg Oral BID    rivaroxaban (XARELTO) tablet 20 mg  20 mg Oral Daily with breakfast    sodium chloride flush 0.9 % injection 5-40 mL  5-40 mL IntraVENous 2 times per day    sodium chloride flush 0.9 % injection 5-40 mL  5-40 mL IntraVENous PRN    0.9 % sodium chloride infusion   IntraVENous PRN    polyethylene glycol (GLYCOLAX) packet 17 g  17 g Oral Daily PRN    dextrose 5 % and 0.45 % sodium chloride infusion  IntraVENous Continuous    acetaminophen (TYLENOL) tablet 650 mg  650 mg Oral Q6H PRN    ondansetron (ZOFRAN-ODT) disintegrating tablet 4 mg  4 mg Oral Q6H PRN    Or    ondansetron (ZOFRAN) injection 4 mg  4 mg IntraVENous Q6H PRN    prochlorperazine (COMPAZINE) tablet 10 mg  10 mg Oral Q6H PRN    Or    prochlorperazine (COMPAZINE) injection 10 mg  10 mg IntraVENous Q6H PRN    HYDROmorphone HCl PF (DILAUDID) injection 1 mg  1 mg IntraVENous Q3H PRN    HYDROmorphone (DILAUDID) tablet 2 mg  2 mg Oral Q6H PRN    naloxone (NARCAN) injection 0.4 mg  0.4 mg IntraVENous PRN     Review of Systems: Comprehensive ROS negative except in HPI  Objective:   Blood pressure 103/64, pulse 84, temperature 98.2 F (36.8 C), resp. rate 17, height 1.727 m (5\' 8" ), weight 76.1 kg (167 lb 12.8 oz), SpO2 96 %.   Intake/Output Summary (Last 24 hours) at 06/15/2023 1059  Last data filed at  06/15/2023 0954  Gross per 24 hour   Intake 1601.32 ml   Output 1600 ml   Net 1.32 ml     Physical Exam:   Constitutional:  the patient is well developed and in no acute distress  EENMT:  Sclera clear, pupils equal, oral mucosa moist  Respiratory: symmetric chest rise. Clear breath sounds bilaterally. Oxygen down to 1 liter  Cardiovascular:  RRR without M,G,R. There is no lower extremity edema.  Gastrointestinal: soft and non-tender; with positive bowel sounds.  Musculoskeletal: warm without cyanosis. Normal muscle tone.   Skin:  no jaundice or rashes, no wounds   Neurologic: symmetric strength, fluent speech  Psychiatric:  calm, appropriate, oriented x 4    Imaging: I performed an independent interpretation of the patient's images.  CXR:       LAB:  Recent Labs     06/14/23  0553 06/15/23  0534   WBC 5.6 4.8   HGB 6.6* 6.0*   HCT 19.4* 17.4*   PLT 381 317     Recent Labs     06/14/23  0553 06/15/23  0534   NA 136 137   K 3.8 4.1   CL 102 105   CO2 21 22   BUN 9 10   CREATININE 1.10 1.03   MG 1.9 1.8   BILITOT 0.7 0.9   AST 26 48*   ALT <5* 18   ALKPHOS 99 105*     No results for input(s): "TROPHS", "NTPROBNP", "CRP", "ESR" in the last 72 hours.  Recent Labs     06/14/23  0553 06/15/23  0534   GLUCOSE 117* 130*      Microbiology:   No results for input(s): "CULTURE" in the last 72 hours.  Recent Labs     06/13/23  1257   COVID19 NOT DETECTED     ECHO: No results found for this or any previous visit.    Assessment and Plan:  (Medical Decision Making)   Impression:  47 y.o female admitted with sickle cell crisis and hypoxia. Had witnessed desaturations in ER while sleeping but saturations adequate while awake and ambulating. Now on Airvo 50%/45L with sat 100% at rest but nursing reports desaturations below 90% when getting up. Receiving pain management per oncology, IVF, and blood transfusion for Hgb 6.6. CT scan with atelectasis. Suspected hypoxia secondary to hypoventilation with pain medication  Principal  Problem:    Sickle cell crisis (HCC)  Plan: some better today per patient. Getting transfusion. Still in pain  Active Problems:    Diffuse pain in left upper extremity  Plan: per hematology    Drowsiness  Plan: awake and conversant this AM.  In bed at present    Hypoxia  Plan: weaned to 1 liter. Has IS at bedside - encourage use and for her to be OOB as able.  Pain better today - not oversedated on exam.       More than 50% of the time documented was spent in face-to-face contact with the patient and in the care of the patient on the floor/unit where the patient is located.    In this split/shared evaluation I performed performed a medically appropriate history and exam, counseled and educated the patient and/or family member, ordered medications, tests or procedures, documented information in EMR, and coordinated care. which accounted for 14 minutes of clinical time.     Terisa Starr, APRN - CNP    In this split/shared evaluation I performed reviewed the patients's H&P, available images, labs, cultures., discussed case in detail with NPP, performed a medically appropriate history and exam, counseled and educated the patient and/or family member, ordered and/or reviewed medications, tests or procedures, documented information in EMR, independently interpreted images, and coordinated care. which accounted for 15 minutes clinical time.     Impression: only requiring 1lpm of oxygen and breathing slowly with occasional wheeze. Ct with slight atelectasis in L base.  Agree with current management including IS, pain control, oxygen as needed.      Please let us know if we can be of further service.  Otherwise will sign off.      Harriett Rush, MD

## 2023-06-16 ENCOUNTER — Inpatient Hospital Stay
Admit: 2023-06-16 | Discharge: 2023-06-20 | Payer: MEDICARE | Primary: Student in an Organized Health Care Education/Training Program

## 2023-06-16 LAB — TYPE AND SCREEN
ABO/Rh: O POS
Antibody Screen: NEGATIVE
Antigen/Antibody: NEGATIVE
Blood Bank Blood Product Expiration Date: 202408052359
Blood Bank ISBT Product Blood Type: 5100
Blood Bank Unit Type and Rh: O POS
Dispense Status Blood Bank: TRANSFUSED
Unit Divison: 0
Unit Issue Date/Time: 71220241038

## 2023-06-16 LAB — COMPREHENSIVE METABOLIC PANEL
ALT: 23 U/L (ref 12–65)
AST: 34 U/L (ref 15–37)
Albumin/Globulin Ratio: 0.9 — ABNORMAL LOW (ref 1.0–1.9)
Albumin: 3.3 g/dL — ABNORMAL LOW (ref 3.5–5.0)
Alk Phosphatase: 104 U/L (ref 35–104)
Anion Gap: 9 mmol/L (ref 9–18)
BUN: 8 MG/DL (ref 6–23)
CO2: 22 mmol/L (ref 20–28)
Calcium: 8.8 MG/DL (ref 8.8–10.2)
Chloride: 105 mmol/L (ref 98–107)
Creatinine: 0.94 MG/DL (ref 0.60–1.10)
Est, Glom Filt Rate: 74 mL/min/{1.73_m2} (ref >60)
Globulin: 3.5 g/dL (ref 2.3–3.5)
Glucose: 104 mg/dL — ABNORMAL HIGH (ref 70–99)
Potassium: 3.7 mmol/L (ref 3.5–5.1)
Sodium: 137 mmol/L (ref 136–145)
Total Bilirubin: 1.2 MG/DL (ref 0.0–1.2)
Total Protein: 6.8 g/dL (ref 6.3–8.2)

## 2023-06-16 LAB — CBC WITH AUTO DIFFERENTIAL
Basophils %: 0 % (ref 0.0–2.0)
Basophils Absolute: 0 10*3/uL (ref 0.0–0.2)
Eosinophils %: 3 % (ref 0.5–7.8)
Eosinophils Absolute: 0.1 10*3/uL (ref 0.0–0.8)
Hematocrit: 20.9 % — ABNORMAL LOW (ref 35.8–46.3)
Hemoglobin: 7.4 g/dL — ABNORMAL LOW (ref 11.7–15.4)
Immature Granulocytes %: 1 % (ref 0.0–5.0)
Immature Granulocytes Absolute: 0 10*3/uL (ref 0.0–0.5)
Lymphocytes %: 40 % (ref 13–44)
Lymphocytes Absolute: 1.6 10*3/uL (ref 0.5–4.6)
MCH: 40.2 PG — ABNORMAL HIGH (ref 26.1–32.9)
MCHC: 35.4 g/dL — ABNORMAL HIGH (ref 31.4–35.0)
MCV: 113.6 FL — ABNORMAL HIGH (ref 82–102)
MPV: 9.6 FL (ref 9.4–12.3)
Monocytes %: 9 % (ref 4.0–12.0)
Monocytes Absolute: 0.4 10*3/uL (ref 0.1–1.3)
Neutrophils %: 47 % (ref 43–78)
Neutrophils Absolute: 2 10*3/uL (ref 1.7–8.2)
Platelet Comment: ADEQUATE
Platelets: 321 10*3/uL (ref 150–450)
RBC: 1.84 M/uL — ABNORMAL LOW (ref 4.05–5.2)
WBC: 4.1 10*3/uL — ABNORMAL LOW (ref 4.3–11.1)
nRBC: 1.33 10*3/uL — ABNORMAL HIGH (ref 0.0–0.2)

## 2023-06-16 LAB — MAGNESIUM: Magnesium: 1.7 mg/dL — ABNORMAL LOW (ref 1.8–2.4)

## 2023-06-16 LAB — RETICULOCYTES
Absolute Retic #: 0.1363 M/ul — ABNORMAL HIGH (ref 0.026–0.095)
Immature Retic Fraction: 15.2 % (ref 3.0–15.9)
Retic Hemoglobin conc.: 34 pg (ref 29–35)
Reticulocyte Count,Automated: 7.4 % — ABNORMAL HIGH (ref 0.3–2.0)

## 2023-06-16 MED ORDER — HYDROMORPHONE HCL 2 MG PO TABS
2 | ORAL | Status: DC | PRN
Start: 2023-06-16 — End: 2023-06-20
  Administered 2023-06-17 – 2023-06-20 (×10): 2 mg via ORAL

## 2023-06-16 MED FILL — KETOROLAC TROMETHAMINE 30 MG/ML IJ SOLN: 30 MG/ML | INTRAMUSCULAR | Qty: 1

## 2023-06-16 MED FILL — HYDROMORPHONE HCL 1 MG/ML IJ SOLN: 1 MG/ML | INTRAMUSCULAR | Qty: 1

## 2023-06-16 MED FILL — FOLIC ACID 1 MG PO TABS: 1 MG | ORAL | Qty: 1

## 2023-06-16 MED FILL — CETIRIZINE HCL 10 MG PO TABS: 10 MG | ORAL | Qty: 1

## 2023-06-16 MED FILL — XARELTO 20 MG PO TABS: 20 MG | ORAL | Qty: 1

## 2023-06-16 MED FILL — HYDREA 500 MG PO CAPS: 500 MG | ORAL | Qty: 2

## 2023-06-16 MED FILL — LEVETIRACETAM 500 MG PO TABS: 500 MG | ORAL | Qty: 3

## 2023-06-16 MED FILL — FLUOXETINE HCL 20 MG PO CAPS: 20 MG | ORAL | Qty: 2

## 2023-06-16 NOTE — Progress Notes (Signed)
ACUTE PHYSICAL THERAPY GOALS:   (Developed with and agreed upon by patient and/or caregiver.)  LTG:  (1.)[name] Bright will move from supine to sit and sit to supine , scoot up and down, and roll side to side in bed with INDEPENDENT within 7 treatment day(s).    (2.)[name] Bright will transfer from bed to chair and chair to bed with INDEPENDENT using the least restrictive device within 7 treatment day(s).    (3.)[name] Bright will ambulate with INDEPENDENT for 500+ feet with the least restrictive device within 7 treatment day(s).  ________________________________________________________________________________________________     PHYSICAL THERAPY Initial Assessment and AM  (Link to Caseload Tracking: PT Visit Days : 1  Acknowledge Orders  Time In/Out  PT Charge Capture  Rehab Caseload Tracker    [name] Bright is a 47 y.o. female   PRIMARY DIAGNOSIS: Sickle cell crisis (HCC)  Sickle cell crisis (HCC) [D57.00]  Drowsiness [R40.0]  Hypoxia [R09.02]  Diffuse pain in left upper extremity [M79.602]       Reason for Referral: Generalized Muscle Weakness (M62.81)  Difficulty in walking, Not elsewhere classified (R26.2)  Inpatient: Payor: [employer] MEDICARE / Plan: [employer] GOLD PLUS HMO / Product Type: *No Product type* /     ASSESSMENT:     REHAB RECOMMENDATIONS:   Recommendation to date pending progress:  Setting:  Home Health Therapy-if needed pending progress    Equipment:    To Be Determined     ASSESSMENT:  [name] Bright presents to hospital on 7/9 with pain all over, found to be in sickle cell crisis. Pt required BIPAP, airvo, now on 1 L O2, stats >90%. Pt lethargic but agreeable to mobility, cleared to ambulate on RA per RN. Pt with SBA bed mobility, transfers. Pt ambulated in hallway without assistive device, noted to have limp with mobility, reports "bad" hip, needing hip replacement. Pt with 1-2 LOB but able to self correct; otherwise overall SBA/CGA with ambulation. Pt O2 RA >90% with activity. Pt functioning below  baseline of independent, PT to follow for acute care needs to address decreased transfers, ambulation, activity tolerance and overall functional mobility. Anticipate return home at discharge, HHPT if needed.      [employer] AM-PACT "6 Clicks" Basic Mobility Inpatient Short Form  AM-PAC Basic Mobility - Inpatient   How much help is needed turning from your back to your side while in a flat bed without using bedrails?: A Little  How much help is needed moving from lying on your back to sitting on the side of a flat bed without using bedrails?: A Little  How much help is needed moving to and from a bed to a chair?: A Little  How much help is needed standing up from a chair using your arms?: A Little  How much help is needed walking in hospital room?: A Little  How much help is needed climbing 3-5 steps with a railing?: A Little  AM-PAC Inpatient Mobility Raw Score : 18  AM-PAC Inpatient T-Scale Score : 43.63  Mobility Inpatient CMS 0-100% Score: 46.58  Mobility Inpatient CMS G-Code Modifier : CK    SUBJECTIVE:   [name] Bright states, "I am getting better"     Social/Functional Lives With: Parent  Type of Home: House  Home Layout: One level  ADL Assistance: Independent  Ambulation Assistance: Independent  Occupation: On disability  Reports independent at baseline, CG to mother at times  OBJECTIVE:     PAIN: VITALS / O2: PRECAUTION / LINES / DRAINS:  Pre Treatment:    0/10      Post Treatment: 0/10 Vitals    WDL    Oxygen   1 L O2  RA for ambulation, O2 >90%   Continuous Pulse Oximetry    RESTRICTIONS/PRECAUTIONS:                    GROSS EVALUATION:  Intact Impaired (Comments):   AROM [x]      PROM [x]     Strength []   Grossly WFL   Balance []   Fair + to good   Posture []  N/A   Sensation []   NT   Coordination [x]       Tone [x]      Edema []     Activity Tolerance []   General fatigue    []       COGNITION/  PERCEPTION: Intact Impaired (Comments):   Orientation [x]      Vision [x]      Hearing [x]      Cognition  [x]         MOBILITY: I Mod I S SBA CGA Min Mod Max Total  NT x2 Comments:   Bed Mobility    Rolling []  []  []  []  []  []  []  []  []  [x]  []     Supine to Sit []  []  []  [x]  []  []  []  []  []  []  []     Scooting [x]  []  []  [x]  []  []  []  []  []  []  []     Sit to Supine []  []  []  []  []  []  []  []  []  [x]  []  In chair   Transfers    Sit to Stand []  []  []  [x]  []  []  []  []  []  []  []     Bed to Chair []  []  []  [x]  [x]  []  []  []  []  []  []     Stand to Sit []  []  []  [x]  []  []  []  []  []  []  []      []  []  []  []  []  []  []  []  []  []  []     I=Independent, Mod I=Modified Independent, S=Supervision, SBA=Standby Assistance, CGA=Contact Guard Assistance,   Min=Minimal Assistance, Mod=Moderate Assistance, Max=Maximal Assistance, Total=Total Assistance, NT=Not Tested    GAIT: I Mod I S SBA CGA Min Mod Max Total  NT x2 Comments:   Level of Assistance []  []  []  [x]  [x]  []  []  []  []  []  []  1-2 LOB with ambulation    Distance 200  feet    DME None    Gait Quality Decreased cadence , Decreased step clearance, Decreased step length, Narrow base of support, and slight limp    Weightbearing Status      Stairs      I=Independent, Mod I=Modified Independent, S=Supervision, SBA=Standby Assistance, CGA=Contact Guard Assistance,   Min=Minimal Assistance, Mod=Moderate Assistance, Max=Maximal Assistance, Total=Total Assistance, NT=Not Tested    PLAN:   FREQUENCY AND DURATION: 3 times/week for duration of hospital stay or until stated goals are met, whichever comes first.    THERAPY PROGNOSIS: Good    PROBLEM LIST:   (Skilled intervention is medically necessary to address:)  Decreased ADL/Functional Activities  Decreased Activity Tolerance  Decreased Balance  Decreased Gait Ability  Decreased Strength  Decreased Transfer Abilities INTERVENTIONS PLANNED:   (Benefits and precautions of physical therapy have been discussed with the patient.)  Therapeutic Activity  Therapeutic Exercise/HEP  Neuromuscular Re-education  Gait Training  Education       TREATMENT:   EVALUATION: LOW COMPLEXITY: (Untimed  Charge)  The initial evaluation charge encompasses clinical chart review, objective assessment, interpretation of assessment, and skilled monitoring of the patient's response to treatment  in order to develop a plan of care.     TREATMENT:   Therapeutic Activity (10 Minutes): Therapeutic activity included Supine to Sit, Scooting, Transfer Training, Ambulation on level ground, Sitting balance , and Standing balance to improve functional Activity tolerance, Balance, Coordination, and Mobility.    TREATMENT GRID:  N/A    AFTER TREATMENT PRECAUTIONS: Bed/Chair Locked, Call light within reach, Chair, Needs within reach, and RN notified    INTERDISCIPLINARY COLLABORATION:  RN/ PCT and PT/ PTA    EDUCATION:      TIME IN/OUT:  Time In: 1150  Time Out: 1210  Minutes: 20    Wynn Banker, PT

## 2023-06-16 NOTE — Progress Notes (Addendum)
Attending Addendum:  Patient seen with NP.  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  She was seen in ED with pain/sickle cell crisis.  We were asked to admit as pt got opiod therapy for crisis and developed hypoxia when sleeping.  She was admitted on 7/9.  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  She is on folic acid and hydroxyurea.  She came to ED w pain all over.  She did admit to helping her mom move in last few days.  She was out of Dilaudid at home, but also does not use it regularly.  She reported SOB.  She was treated w Dilaudid in ED.  Per ED pt with desaturations to 80s when asleep.  When she is awake and walking around O2 was normal.  We were asked to admit for close monitoring until resolution.  No reported fevers/cough.  CXR neg.  UA neg.  RVP neg.  C/w folate/hydrohyurea.  CTA chest w atelectasis, no PE/PNA.  C/w IVF and pain management as needed.  C/w continuous pulseox.  LUE pain - doppler w no DVT.  Has hx of SVC thrombosis/VTE - c/w DOAC.  Appreciate pulm consult - rec ambulation/IS and tapering O2.  Pain ongoing, but feels it is controlled on current regimen.  Did get IV Dilaudid in last 24hrs.  Appears somewhat sleepy but easily arousable - will see if able to taper to PO Dilaudid which she takes at home PRN/seldom.  Labs reviewed and Hb down to 6 yesterday and pt was transfused 1 u PRBC with Hb up to 7.4.  C/o pain and neck edema - will check [place].  ?expansion of collaterals w IVF?, will lower rate to 75cc/hr.  Plan to repeat CXR today - pt on IVF and reported cough days prior to admission/sick contact.      I personally performed a face to face diagnostic evaluation on this patient.  My findings are as follows: A&ox3, pt looks comfortable/somnolent, lungs clear, heart regular, abdomen benign and no LE edema, collateral vessels on exam over chest and abdomen.          I reviewed the current  findings, plan and labs/imaging with the pt.            Philis Pique, MD  [facility] Hematology and Oncology  [address]  [place], [place] [address]  Office : [phone]  Fax : [phone]      [facility] Hematology & On[facility]        Inpatient Hematology / Oncology Progress Note    Reason for Admission:  Sickle cell crisis (HCC) [D57.00]  Drowsiness [R40.0]  Hypoxia [R09.02]  Diffuse pain in left upper extremity [M79.602]    24 Hour Events:  Afebrile, VSS - off O2  Pain somewhat better, wean to PO Dilaudid   Hgb up to 7.4 after transfusion  C/o b/l neck edema - no venous thrombus in LUE, RUE ultrasound pending    Transfusions: None  Replacements: None      ROS:  Constitutional: Negative for fever, chills.  CV: Negative for chest pain, palpitations, edema.  Respiratory: +cough. Negative for wheezing.  GI: +nausea. Negative for abdominal pain, diarrhea.    10 point review of systems is otherwise negative with the exception of the elements mentioned above in the HPI.           Allergies   Allergen  Reactions    Latex Rash    Ceftriaxone Other (See Comments)    Fentanyl Other (See Comments)     "my doctor said I had a stroke from it"    Influenza Vaccines Itching     unknown      Influenza Virus Vaccine Swelling    Meperidine Other (See Comments)    Morphine Itching    Oxycodone Other (See Comments)    Sulfa Antibiotics Rash     Past Medical History:   Diagnosis Date    Arthritis     Hx of blood clots     Migraines     Seizures (HCC)     Sickle cell disease (HCC)     Stroke (HCC)      Past Surgical History:   Procedure Laterality Date    CESAREAN SECTION      CHOLECYSTECTOMY      LAPAROTOMY      TUBAL LIGATION      VASCULAR SURGERY       Family History   Problem Relation Age of Onset    Diabetes Mother     Hypertension Mother     Diabetes Father     Hypertension Father      Social History     Socioeconomic History    Marital status: Single     Spouse name: Not on file    Number of children: Not on file     Years of education: Not on file    Highest education level: Not on file   Occupational History    Not on file   Tobacco Use    Smoking status: Never     Passive exposure: Never    Smokeless tobacco: Never   Substance and Sexual Activity    Alcohol use: Not Currently    Drug use: Never    Sexual activity: Not Currently   Other Topics Concern    Not on file   Social History Narrative    Not on file     Social Determinants of Health     Financial Resource Strain: Not on file   Food Insecurity: No Food Insecurity (06/13/2023)    Hunger Vital Sign     Worried About Running Out of Food in the Last Year: Never true     Ran Out of Food in the Last Year: Never true   Transportation Needs: No Transportation Needs (06/13/2023)    PRAPARE - Therapist, art (Medical): No     Lack of Transportation (Non-Medical): No   Physical Activity: Not on file   Stress: Not on file   Social Connections: Not on file   Intimate Partner Violence: Not on file   Housing Stability: Low Risk  (06/13/2023)    Housing Stability Vital Sign     Unable to Pay for Housing in the Last Year: No     Number of Places Lived in the Last Year: 1     Unstable Housing in the Last Year: No     Current Facility-Administered Medications   Medication Dose Route Frequency Provider Last Rate Last Admin    HYDROmorphone (DILAUDID) tablet 2 mg  2 mg Oral Q4H PRN Fredderick Phenix, NP-C        0.9 % sodium chloride infusion   IntraVENous PRN Trisha Mangle, APRN - CNP        acetaminophen (TYLENOL) tablet 650 mg  650 mg Oral See Admin Instructions Trisha Mangle, APRN -  CNP   650 mg at 06/15/23 0906    diphenhydrAMINE (BENADRYL) capsule 25 mg  25 mg Oral See Admin Instructions Trisha Mangle, APRN - CNP   25 mg at 06/15/23 0908    ketorolac (TORADOL) injection 30 mg  30 mg IntraVENous Q6H Trisha Mangle, APRN - CNP   30 mg at 06/16/23 1110    cetirizine (ZYRTEC) tablet 10 mg  10 mg Oral Daily Trisha Mangle, APRN - CNP   10 mg at 06/16/23 0855    FLUoxetine  (PROZAC) capsule 40 mg  40 mg Oral Daily Trisha Mangle, APRN - CNP   40 mg at 06/16/23 0855    folic acid (FOLVITE) tablet 1 mg  1 mg Oral Daily Trisha Mangle, APRN - CNP   1 mg at 06/16/23 0855    hydroxyurea (HYDREA) chemo capsule 1,000 mg  1,000 mg Oral Daily Trisha Mangle, APRN - CNP   1,000 mg at 06/16/23 0855    levETIRAcetam (KEPPRA) tablet 1,500 mg  1,500 mg Oral BID Trisha Mangle, APRN - CNP   1,500 mg at 06/16/23 1610    rivaroxaban (XARELTO) tablet 20 mg  20 mg Oral Daily with breakfast Trisha Mangle, APRN - CNP   20 mg at 06/16/23 0805    sodium chloride flush 0.9 % injection 5-40 mL  5-40 mL IntraVENous 2 times per day Trisha Mangle, APRN - CNP   10 mL at 06/16/23 0858    sodium chloride flush 0.9 % injection 5-40 mL  5-40 mL IntraVENous PRN Trisha Mangle, APRN - CNP        0.9 % sodium chloride infusion   IntraVENous PRN Trisha Mangle, APRN - CNP        polyethylene glycol (GLYCOLAX) packet 17 g  17 g Oral Daily PRN Trisha Mangle, APRN - CNP        dextrose 5 % and 0.45 % sodium chloride infusion   IntraVENous Continuous Fredderick Phenix, NP-C 75 mL/hr at 06/16/23 1219 Rate Change at 06/16/23 1219    acetaminophen (TYLENOL) tablet 650 mg  650 mg Oral Q6H PRN Trisha Mangle, APRN - CNP   650 mg at 06/14/23 0835    ondansetron (ZOFRAN-ODT) disintegrating tablet 4 mg  4 mg Oral Q6H PRN Trisha Mangle, APRN - CNP   4 mg at 06/13/23 1727    Or    ondansetron (ZOFRAN) injection 4 mg  4 mg IntraVENous Q6H PRN Trisha Mangle, APRN - CNP        prochlorperazine (COMPAZINE) tablet 10 mg  10 mg Oral Q6H PRN Trisha Mangle, APRN - CNP        Or    prochlorperazine (COMPAZINE) injection 10 mg  10 mg IntraVENous Q6H PRN Trisha Mangle, APRN - CNP        [Held by provider] HYDROmorphone HCl PF (DILAUDID) injection 1 mg  1 mg IntraVENous Q3H PRN Trisha Mangle, APRN - CNP   1 mg at 06/16/23 0731    naloxone (NARCAN) injection 0.4 mg  0.4 mg IntraVENous PRN Trisha Mangle, APRN - CNP           OBJECTIVE:  Patient Vitals for the past 8 hrs:   BP Temp  Temp src Pulse Resp SpO2   06/16/23 0734 128/76 98.2 F (36.8 C) Oral 86 17 94 %   06/16/23 0731 -- -- -- -- 17 --     Temp (24hrs), Avg:98.3 F (36.8 C), Min:97.9 F (36.6 C), Max:98.6 F (37 C)    07/13  0701 - 07/13 1900  In: 240 [P.O.:240]  Out: -     Physical Exam:  Constitutional: Acutely ill-appearing female in no acute distress, sitting comfortably in the hospital bed.    HEENT: Normocephalic and atraumatic. Oropharynx is clear, mucous membranes are moist.  Extraocular muscles are intact.  Sclerae anicteric. Neck supple.    Skin Warm and dry.  No bruising and no rash noted.  No erythema.  No pallor.    Neuro Grossly nonfocal with no obvious sensory or motor deficits.   MSK Normal range of motion in general.     Psych Appropriate mood and affect.    Full exam per attending MD    Labs:    Recent Results (from the past 24 hour(s))   Comprehensive Metabolic Panel    Collection Time: 06/16/23  5:32 AM   Result Value Ref Range    Sodium 137 136 - 145 mmol/L    Potassium 3.7 3.5 - 5.1 mmol/L    Chloride 105 98 - 107 mmol/L    CO2 22 20 - 28 mmol/L    Anion Gap 9 9 - 18 mmol/L    Glucose 104 (H) 70 - 99 mg/dL    BUN 8 6 - 23 MG/DL    Creatinine 3.66 4.40 - 1.10 MG/DL    Est, Glom Filt Rate 74 >60 ml/min/1.81m2    Calcium 8.8 8.8 - 10.2 MG/DL    Total Bilirubin 1.2 0.0 - 1.2 MG/DL    ALT 23 12 - 65 U/L    AST 34 15 - 37 U/L    Alk Phosphatase 104 35 - 104 U/L    Total Protein 6.8 6.3 - 8.2 g/dL    Albumin 3.3 (L) 3.5 - 5.0 g/dL    Globulin 3.5 2.3 - 3.5 g/dL    Albumin/Globulin Ratio 0.9 (L) 1.0 - 1.9     Magnesium    Collection Time: 06/16/23  5:32 AM   Result Value Ref Range    Magnesium 1.7 (L) 1.8 - 2.4 mg/dL   CBC with Auto Differential    Collection Time: 06/16/23  5:32 AM   Result Value Ref Range    WBC 4.1 (L) 4.3 - 11.1 K/uL    RBC 1.84 (L) 4.05 - 5.2 M/uL    Hemoglobin 7.4 (L) 11.7 - 15.4 g/dL    Hematocrit 34.7 (L) 35.8 - 46.3 %    MCV 113.6 (H) 82 - 102 FL    MCH 40.2 (H) 26.1 - 32.9 PG    MCHC 35.4  (H) 31.4 - 35.0 g/dL    RDW Cannot be calculated %    Platelets 321 150 - 450 K/uL    MPV 9.6 9.4 - 12.3 FL    nRBC 1.33 (H) 0.0 - 0.2 K/uL    Neutrophils % 47 43 - 78 %    Lymphocytes % 40 13 - 44 %    Monocytes % 9 4.0 - 12.0 %    Eosinophils % 3 0.5 - 7.8 %    Basophils % 0 0.0 - 2.0 %    Immature Granulocytes % 1 0.0 - 5.0 %    Neutrophils Absolute 2.0 1.7 - 8.2 K/UL    Lymphocytes Absolute 1.6 0.5 - 4.6 K/UL    Monocytes Absolute 0.4 0.1 - 1.3 K/UL    Eosinophils Absolute 0.1 0.0 - 0.8 K/UL    Basophils Absolute 0.0 0.0 - 0.2 K/UL    Immature Granulocytes Absolute 0.0 0.0 - 0.5 K/UL  RBC Comment MODERATE  SICKLE CELLS        RBC Comment MODERATE  ANISOCYTOSIS + POIKILOCYTOSIS        RBC Comment MODERATE  POLYCHROMASIA        RBC Comment MODERATE  SCHISTOCYTES        WBC Comment Result Confirmed By Smear      Platelet Comment ADEQUATE      Differential Type AUTOMATED     Reticulocytes    Collection Time: 06/16/23  5:32 AM   Result Value Ref Range    Reticulocyte Count,Automated 7.4 (H) 0.3 - 2.0 %    Absolute Retic # 0.1363 (H) 0.026 - 0.095 M/ul    Immature Retic Fraction 15.2 3.0 - 15.9 %    Retic Hemoglobin conc. 34 29 - 35 pg       Imaging:  Xray Result (most recent):  XR CHEST PORTABLE 06/15/2023    Narrative  XR CHEST PORTABLE    INDICATION: Hypoxia.    COMPARISON: CT 06/13/2023.    FINDINGS:  A single AP view of the chest is provided for interpretation.  The  heart size is normal. There is left basilar atelectasis. The left hemidiaphragm  is eventrated. There is no evidence of pneumothorax or pleural effusion.  Degenerative change is present within the right shoulder.    Impression  Eventration of the left hemidiaphragm with left basilar atelectasis.    No acute cardiopulmonary findings.      Electronically signed by Denese Killings    CT Result (most recent):  CT CHEST PULMONARY EMBOLISM W CONTRAST 06/13/2023    Narrative  CT CHEST PULMONARY EMBOLISM W CONTRAST    HISTORY:hypoxia, hx of PE, sickle cell  crisis.    COMPARISON: 05/27/2022    TECHNIQUE: Following the noncontrasted scout, axial images of the thorax were  obtained following infusion of 100 cc of Isovue 370.  Post-processing on an  independent workstation was performed to reconstruct MIP images for evaluation  of the thoracic vasculature.    FINDINGS: There is no pulmonary embolism, aortic dissection, thoracic aneurysm  or pericardial fluid. 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    Discoid atelectasis in the lingula segment, right middle lobe and both lower  lobes is stable. The lung parenchyma is otherwise clear. No pleural effusion or  pneumothorax is noted. There is no axillary, mediastinal or hilar adenopathy.    Limited evaluation of the upper abdomen demonstrates no gross abnormalities.    Review of bone windows demonstrates no osteoblastic or lytic lesions.    Impression  1.  There is no pulmonary embolism, aortic dissection, pericardial fluid or  thoracic aneurysm.  2.  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  3.  No acute lung findings. Discoid atelectasis in the lingula segment, right  middle lobe and both lower lobes is stable.        All CT scans at this facility are performed using low  dose modulation  techniques as appropriate to perform exam including the following: automated  exposure control; use of iterative reconstruction technique; adjustment of the  mA and/or kV according to patient size (this includes techniques or standardized  protocols for targeted exams where dose is matched to indication/reason for  exam).              Electronically signed by Georgiann Mccoy          ASSESSMENT:  Principal  Problem:    Sickle cell crisis (HCC)  Active Problems:    Diffuse pain in left upper extremity    Drowsiness    Hypoxia  Resolved Problems:    * No resolved hospital problems. *    [name]. Kulla is a 47 y.o. female admitted on  06/12/2023. The primary encounter diagnosis was Sickle cell crisis (HCC). 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  She is a patient of Dr. Welton Flakes followed for sickle cell disease on folic acid 1mg  daily and Hydrea 1000mg  daily.  She presented to ED with c/o "pain all over" for the past four days.  She has been helping family move for the past 5 days.  Out of dilaudid at home.  She reports some chest discomfort and shortness of breath.  Denies cough or fever.  CXR neg.  UA pending.  RVP pending.  Hgb 7.3 (b/l ~7-8).  She is being admitted for further management of sickle cell crisis.      PLAN:  Sickle cell crisis  - con't folic acid, hydrea  - RVP pending.  CXR neg.  CTA chest ordered.  - UA pending.  - D5 1/2 NS @ / hr  - No IV benadryl or IV phenergan  - Pain mgmt  7/11 Pain ongoing.  Toradol ordered.  Hgb down to 6.6.  Hold off on transfusion at this time.  UA not c/w UTI.    7/12 Pain ongoing, but controlled on currently regimen.  On toradol.  Requiring IV dilaudid.  Hgb down to 6.0.  Transfuse 1 unit PRBCs.  7/13 Hgb 7.4 after transfusion. On toradol every 6 hours. Hold IV Dilaudid and change to PO in preparation for discharge.     Hypoxia  7/10 On Airvo @ 60L.  ABG results reviewed.  CTA chest pending.  RVP pending.  Consult pulm.  7/11 Airvo down to 45L, sat 100%.  RVP neg.  CTA chest neg for acute findings; shows atelectasis.  Pulm consult pending.  IS ordered.  7/12 On O2 @ 1L.  O2 qualifier ordered.  Pulm following.  Repeat CXR.  7/13 Off O2 and saturating well.     LUE pain/Bilateral Neck Edema  - LUE dopp ordered  7/11 LUE dopp neg  7/13 RUE dopp pending.     Hx SVC thrombosis / RUE DVT / PE  - con't Xarelto        Continue home meds  Supportive care  On Xarelto    Dispo:  TBD pending clinical course.   PT eval pending.              [name] Roes, NP-C   [facility] Hematology & Oncology  [address]  [place] [address]  Office : [phone]  Fax : [phone]

## 2023-06-17 ENCOUNTER — Inpatient Hospital Stay: Admit: 2023-06-17 | Payer: MEDICARE | Primary: Student in an Organized Health Care Education/Training Program

## 2023-06-17 LAB — COMPREHENSIVE METABOLIC PANEL
ALT: 12 U/L (ref 12–65)
AST: 29 U/L (ref 15–37)
Albumin/Globulin Ratio: 0.8 — ABNORMAL LOW (ref 1.0–1.9)
Albumin: 3.1 g/dL — ABNORMAL LOW (ref 3.5–5.0)
Alk Phosphatase: 100 U/L (ref 35–104)
Anion Gap: 10 mmol/L (ref 9–18)
BUN: 7 MG/DL (ref 6–23)
CO2: 22 mmol/L (ref 20–28)
Calcium: 8.9 MG/DL (ref 8.8–10.2)
Chloride: 107 mmol/L (ref 98–107)
Creatinine: 0.85 MG/DL (ref 0.60–1.10)
Est, Glom Filt Rate: 84 mL/min/{1.73_m2} (ref >60)
Globulin: 3.7 g/dL — ABNORMAL HIGH (ref 2.3–3.5)
Glucose: 100 mg/dL — ABNORMAL HIGH (ref 70–99)
Potassium: 3.5 mmol/L (ref 3.5–5.1)
Sodium: 138 mmol/L (ref 136–145)
Total Bilirubin: 0.9 MG/DL (ref 0.0–1.2)
Total Protein: 6.8 g/dL (ref 6.3–8.2)

## 2023-06-17 LAB — CBC WITH AUTO DIFFERENTIAL
Basophils %: 0 % (ref 0.0–2.0)
Basophils Absolute: 0 10*3/uL (ref 0.0–0.2)
Eosinophils %: 2 % (ref 0.5–7.8)
Eosinophils Absolute: 0.1 10*3/uL (ref 0.0–0.8)
Hematocrit: 20.8 % — ABNORMAL LOW (ref 35.8–46.3)
Hemoglobin: 7.4 g/dL — ABNORMAL LOW (ref 11.7–15.4)
Immature Granulocytes %: 0 % (ref 0.0–5.0)
Immature Granulocytes Absolute: 0 10*3/uL (ref 0.0–0.5)
Lymphocytes %: 23 % (ref 13–44)
Lymphocytes Absolute: 1.2 10*3/uL (ref 0.5–4.6)
MCH: 40.9 PG — ABNORMAL HIGH (ref 26.1–32.9)
MCHC: 35.6 g/dL — ABNORMAL HIGH (ref 31.4–35.0)
MCV: 114.9 FL — ABNORMAL HIGH (ref 82–102)
MPV: 9.7 FL (ref 9.4–12.3)
Monocytes %: 9 % (ref 4.0–12.0)
Monocytes Absolute: 0.5 10*3/uL (ref 0.1–1.3)
Neutrophils %: 66 % (ref 43–78)
Neutrophils Absolute: 3.6 10*3/uL (ref 1.7–8.2)
Platelet Comment: ADEQUATE
Platelets: 306 10*3/uL (ref 150–450)
RBC: 1.81 M/uL — ABNORMAL LOW (ref 4.05–5.2)
WBC: 5.4 10*3/uL (ref 4.3–11.1)
nRBC: 1.08 10*3/uL — ABNORMAL HIGH (ref 0.0–0.2)

## 2023-06-17 LAB — RETICULOCYTES
Absolute Retic #: 0.1044 M/ul — ABNORMAL HIGH (ref 0.026–0.095)
Immature Retic Fraction: 16.6 % — ABNORMAL HIGH (ref 3.0–15.9)
Retic Hemoglobin conc.: 34 pg (ref 29–35)
Reticulocyte Count,Automated: 5.8 % — ABNORMAL HIGH (ref 0.3–2.0)

## 2023-06-17 LAB — MAGNESIUM: Magnesium: 1.7 mg/dL — ABNORMAL LOW (ref 1.8–2.4)

## 2023-06-17 MED ORDER — AZITHROMYCIN 250 MG PO TABS
250 MG | Freq: Every day | ORAL | Status: AC
Start: 2023-06-17 — End: 2023-06-22
  Administered 2023-06-18 – 2023-06-20 (×3): 250 mg via ORAL

## 2023-06-17 MED ORDER — AZITHROMYCIN 250 MG PO TABS
250 | Freq: Every day | ORAL | Status: AC
Start: 2023-06-17 — End: 2023-06-17
  Administered 2023-06-17: 17:00:00 500 mg via ORAL

## 2023-06-17 MED FILL — HYDREA 500 MG PO CAPS: 500 MG | ORAL | Qty: 2

## 2023-06-17 MED FILL — XARELTO 20 MG PO TABS: 20 MG | ORAL | Qty: 1

## 2023-06-17 MED FILL — KETOROLAC TROMETHAMINE 30 MG/ML IJ SOLN: 30 MG/ML | INTRAMUSCULAR | Qty: 1

## 2023-06-17 MED FILL — CETIRIZINE HCL 10 MG PO TABS: 10 MG | ORAL | Qty: 1

## 2023-06-17 MED FILL — HYDROMORPHONE HCL 2 MG PO TABS: 2 MG | ORAL | Qty: 1

## 2023-06-17 MED FILL — FLUOXETINE HCL 20 MG PO CAPS: 20 MG | ORAL | Qty: 2

## 2023-06-17 MED FILL — AZITHROMYCIN 250 MG PO TABS: 250 MG | ORAL | Qty: 2

## 2023-06-17 MED FILL — LEVETIRACETAM 500 MG PO TABS: 500 MG | ORAL | Qty: 3

## 2023-06-17 MED FILL — FOLIC ACID 1 MG PO TABS: 1 MG | ORAL | Qty: 1

## 2023-06-17 NOTE — Progress Notes (Addendum)
Attending Addendum:  Patient seen with NP.  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  She was seen in ED with pain/sickle cell crisis.  We were asked to admit as pt got opiod therapy for crisis and developed hypoxia when sleeping.  She was admitted on 7/9.  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  She is on folic acid and hydroxyurea.  She came to ED w pain all over.  She did admit to helping her mom move in last few days.  She was out of Dilaudid at home, but also does not use it regularly.  She reported SOB.  She was treated w Dilaudid in ED.  Per ED pt with desaturations to 80s when asleep.  When she was awake and walking around O2 was normal.  We were asked to admit for close monitoring until resolution.  No reported fevers/cough.  CXR neg.  UA neg.  RVP neg.  C/w folate/hydrohyurea.  CTA chest w atelectasis, no PE/PNA.  C/w IVF and pain management as needed.  C/w continuous pulseox.  LUE pain - doppler w no DVT.  Has hx of SVC thrombosis/VTE - c/w DOAC.  Appreciate pulm consult - rec ambulation/IS and tapering O2.  Transitioned her pain regimen to PO Dialudid and she did relatively well. She is reporting chest pain today and will get EKG.  Appears comfortable otherwise.  Sleepy and easily arousable.  Labs reviewed and Hb up to 7.4 after 1 unit transfusion.  No reported bleeding.  Breathing is better.  To taper off O2.  C/o pain and neck edema - checked [place] right UE - neg.  ?expansion of collaterals w IVF?, feels better w lower rate IVF 75cc/hr.  Repeat CXR w very subtle opacity - started on Z-pack.  Plan for d/c in am.      I personally performed a face to face diagnostic evaluation on this patient.  My findings are as follows: A&ox3, pt looks comfortable, lungs clear, heart regular, abdomen benign and no LE edema, collateral vessels on exam over chest and abdomen.          I reviewed the current findings,  plan and labs/imaging with the pt.            Philis Pique, MD  [facility]  [address]  [place], [place] [address]  Office : [phone]  Fax : [phone]      [facility]        Inpatient Hematology / Oncology Progress Note    Reason for Admission:  Sickle cell crisis (HCC) [D57.00]  Drowsiness [R40.0]  Hypoxia [R09.02]  Diffuse pain in left upper extremity [M79.602]    24 Hour Events:  Afebrile, VSS - off O2  Pain somewhat better, has had one PO Dilaudid over night  Hgb stable at 7.4 after transfusion  Bilateral neck edema improved with lowering rate of IVF - no venous thrombus in LUE/LIJ or RUE/RIJ  Complains of mild chest pain - obtain EKG   Repeat CXR this morning with possible opacity left lung base - start azithromycin     Transfusions: None  Replacements: None      ROS:  Constitutional: Negative for fever, chills.  CV: Negative for palpitations, edema. + on and off chest pain.   Respiratory: +cough. Negative for wheezing.  GI: Negative for abdominal pain, diarrhea.  10 point review of systems is otherwise negative with the exception of the elements mentioned above in the HPI.           Allergies   Allergen Reactions    Latex Rash    Ceftriaxone Other (See Comments)    Fentanyl Other (See Comments)     "my doctor said I had a stroke from it"    Influenza Vaccines Itching     unknown      Influenza Virus Vaccine Swelling    Meperidine Other (See Comments)    Morphine Itching    Oxycodone Other (See Comments)    Sulfa Antibiotics Rash     Past Medical History:   Diagnosis Date    Arthritis     Hx of blood clots     Migraines     Seizures (HCC)     Sickle cell disease (HCC)     Stroke (HCC)      Past Surgical History:   Procedure Laterality Date    CESAREAN SECTION      CHOLECYSTECTOMY      LAPAROTOMY      TUBAL LIGATION      VASCULAR SURGERY       Family History   Problem Relation Age of Onset    Diabetes Mother     Hypertension Mother     Diabetes  Father     Hypertension Father      Social History     Socioeconomic History    Marital status: Single     Spouse name: Not on file    Number of children: Not on file    Years of education: Not on file    Highest education level: Not on file   Occupational History    Not on file   Tobacco Use    Smoking status: Never     Passive exposure: Never    Smokeless tobacco: Never   Substance and Sexual Activity    Alcohol use: Not Currently    Drug use: Never    Sexual activity: Not Currently   Other Topics Concern    Not on file   Social History Narrative    Not on file     Social Determinants of Health     Financial Resource Strain: Not on file   Food Insecurity: No Food Insecurity (06/13/2023)    Hunger Vital Sign     Worried About Running Out of Food in the Last Year: Never true     Ran Out of Food in the Last Year: Never true   Transportation Needs: No Transportation Needs (06/13/2023)    PRAPARE - Therapist, art (Medical): No     Lack of Transportation (Non-Medical): No   Physical Activity: Not on file   Stress: Not on file   Social Connections: Not on file   Intimate Partner Violence: Not on file   Housing Stability: Low Risk  (06/13/2023)    Housing Stability Vital Sign     Unable to Pay for Housing in the Last Year: No     Number of Places Lived in the Last Year: 1     Unstable Housing in the Last Year: No     Current Facility-Administered Medications   Medication Dose Route Frequency Provider Last Rate Last Admin    azithromycin (ZITHROMAX) tablet 500 mg  500 mg Oral Daily Fredderick Phenix, NP-C        [START ON 06/18/2023] azithromycin [facility])  tablet 250 mg  250 mg Oral Daily Albin Fischer Kornegay, NP-C        HYDROmorphone (DILAUDID) tablet 2 mg  2 mg Oral Q4H PRN Fredderick Phenix, NP-C   2 mg at 06/17/23 0859    0.9 % sodium chloride infusion   IntraVENous PRN Trisha Mangle, APRN - CNP        acetaminophen (TYLENOL) tablet 650 mg  650 mg Oral See Admin Instructions  Trisha Mangle, APRN - CNP   650 mg at 06/15/23 1308    diphenhydrAMINE (BENADRYL) capsule 25 mg  25 mg Oral See Admin Instructions Trisha Mangle, APRN - CNP   25 mg at 06/15/23 0908    ketorolac (TORADOL) injection 30 mg  30 mg IntraVENous Q6H Trisha Mangle, APRN - CNP   30 mg at 06/17/23 0859    cetirizine (ZYRTEC) tablet 10 mg  10 mg Oral Daily Trisha Mangle, APRN - CNP   10 mg at 06/17/23 0854    FLUoxetine (PROZAC) capsule 40 mg  40 mg Oral Daily Trisha Mangle, APRN - CNP   40 mg at 06/17/23 0854    folic acid (FOLVITE) tablet 1 mg  1 mg Oral Daily Trisha Mangle, APRN - CNP   1 mg at 06/17/23 0855    hydroxyurea (HYDREA) chemo capsule 1,000 mg  1,000 mg Oral Daily Trisha Mangle, APRN - CNP   1,000 mg at 06/17/23 0854    levETIRAcetam (KEPPRA) tablet 1,500 mg  1,500 mg Oral BID Trisha Mangle, APRN - CNP   1,500 mg at 06/17/23 6578    rivaroxaban (XARELTO) tablet 20 mg  20 mg Oral Daily with breakfast Trisha Mangle, APRN - CNP   20 mg at 06/17/23 0825    sodium chloride flush 0.9 % injection 5-40 mL  5-40 mL IntraVENous 2 times per day Trisha Mangle, APRN - CNP   10 mL at 06/17/23 0856    sodium chloride flush 0.9 % injection 5-40 mL  5-40 mL IntraVENous PRN Trisha Mangle, APRN - CNP        0.9 % sodium chloride infusion   IntraVENous PRN Trisha Mangle, APRN - CNP        polyethylene glycol (GLYCOLAX) packet 17 g  17 g Oral Daily PRN Trisha Mangle, APRN - CNP        dextrose 5 % and 0.45 % sodium chloride infusion   IntraVENous Continuous Fredderick Phenix, NP-C 75 mL/hr at 06/17/23 0908 New Bag at 06/17/23 0908    acetaminophen (TYLENOL) tablet 650 mg  650 mg Oral Q6H PRN Trisha Mangle, APRN - CNP   650 mg at 06/14/23 0835    ondansetron (ZOFRAN-ODT) disintegrating tablet 4 mg  4 mg Oral Q6H PRN Trisha Mangle, APRN - CNP   4 mg at 06/13/23 1727    Or    ondansetron (ZOFRAN) injection 4 mg  4 mg IntraVENous Q6H PRN Trisha Mangle, APRN - CNP        prochlorperazine (COMPAZINE) tablet 10 mg  10 mg Oral Q6H PRN Trisha Mangle, APRN - CNP         Or    prochlorperazine (COMPAZINE) injection 10 mg  10 mg IntraVENous Q6H PRN Trisha Mangle, APRN - CNP        [Held by provider] HYDROmorphone HCl PF (DILAUDID) injection 1 mg  1 mg IntraVENous Q3H PRN Trisha Mangle, APRN - CNP   1 mg at 06/16/23 0731    naloxone (NARCAN) injection 0.4 mg  0.4 mg  IntraVENous PRN Trisha Mangle, APRN - CNP           OBJECTIVE:  Patient Vitals for the past 8 hrs:   BP Temp Pulse Resp SpO2   06/17/23 1134 107/69 97.9 F (36.6 C) 70 18 97 %   06/17/23 0859 -- -- -- 19 --   06/17/23 0708 119/81 97.8 F (36.6 C) 81 16 99 %   06/17/23 0451 -- -- -- -- 99 %     Temp (24hrs), Avg:98.4 F (36.9 C), Min:97.8 F (36.6 C), Max:99 F (37.2 C)    07/14 0701 - 07/14 1900  In: 240 [P.O.:240]  Out: -     Physical Exam:  Constitutional: Acutely ill-appearing female in no acute distress, sitting comfortably in the hospital bed.    HEENT: Normocephalic and atraumatic. Oropharynx is clear, mucous membranes are moist.  Extraocular muscles are intact.  Sclerae anicteric. Neck supple.    Skin Warm and dry.  No bruising and no rash noted.  No erythema.  No pallor.    Neuro Grossly nonfocal with no obvious sensory or motor deficits.   MSK Normal range of motion in general.     Psych Appropriate mood and affect.    Full exam per attending MD    Labs:    Recent Results (from the past 24 hour(s))   Comprehensive Metabolic Panel    Collection Time: 06/17/23  5:52 AM   Result Value Ref Range    Sodium 138 136 - 145 mmol/L    Potassium 3.5 3.5 - 5.1 mmol/L    Chloride 107 98 - 107 mmol/L    CO2 22 20 - 28 mmol/L    Anion Gap 10 9 - 18 mmol/L    Glucose 100 (H) 70 - 99 mg/dL    BUN 7 6 - 23 MG/DL    Creatinine 6.57 8.46 - 1.10 MG/DL    Est, Glom Filt Rate 84 >60 ml/min/1.102m2    Calcium 8.9 8.8 - 10.2 MG/DL    Total Bilirubin 0.9 0.0 - 1.2 MG/DL    ALT 12 12 - 65 U/L    AST 29 15 - 37 U/L    Alk Phosphatase 100 35 - 104 U/L    Total Protein 6.8 6.3 - 8.2 g/dL    Albumin 3.1 (L) 3.5 - 5.0 g/dL    Globulin 3.7 (H) 2.3  - 3.5 g/dL    Albumin/Globulin Ratio 0.8 (L) 1.0 - 1.9     Magnesium    Collection Time: 06/17/23  5:52 AM   Result Value Ref Range    Magnesium 1.7 (L) 1.8 - 2.4 mg/dL   CBC with Auto Differential    Collection Time: 06/17/23  5:52 AM   Result Value Ref Range    WBC 5.4 4.3 - 11.1 K/uL    RBC 1.81 (L) 4.05 - 5.2 M/uL    Hemoglobin 7.4 (L) 11.7 - 15.4 g/dL    Hematocrit 96.2 (L) 35.8 - 46.3 %    MCV 114.9 (H) 82 - 102 FL    MCH 40.9 (H) 26.1 - 32.9 PG    MCHC 35.6 (H) 31.4 - 35.0 g/dL    RDW PENDING %    Platelets 306 150 - 450 K/uL    MPV 9.7 9.4 - 12.3 FL    nRBC 1.08 (H) 0.0 - 0.2 K/uL    Neutrophils % 66 43 - 78 %    Lymphocytes % 23 13 - 44 %    Monocytes %  9 4.0 - 12.0 %    Eosinophils % 2 0.5 - 7.8 %    Basophils % 0 0.0 - 2.0 %    Immature Granulocytes % 0 0.0 - 5.0 %    Neutrophils Absolute 3.6 1.7 - 8.2 K/UL    Lymphocytes Absolute 1.2 0.5 - 4.6 K/UL    Monocytes Absolute 0.5 0.1 - 1.3 K/UL    Eosinophils Absolute 0.1 0.0 - 0.8 K/UL    Basophils Absolute 0.0 0.0 - 0.2 K/UL    Immature Granulocytes Absolute 0.0 0.0 - 0.5 K/UL    RBC Comment ANISOCYTOSIS + POIKILOCYTOSIS      RBC Comment OCCASIONAL  MACROCYTOSIS        RBC Comment OCCASIONAL  SICKLE CELLS        RBC Comment OCCASIONAL  TARGET CELLS        WBC Comment Result Confirmed By Smear      Platelet Comment ADEQUATE      Differential Type AUTOMATED     Reticulocytes    Collection Time: 06/17/23  5:52 AM   Result Value Ref Range    Reticulocyte Count,Automated 5.8 (H) 0.3 - 2.0 %    Absolute Retic # 0.1044 (H) 0.026 - 0.095 M/ul    Immature Retic Fraction 16.6 (H) 3.0 - 15.9 %    Retic Hemoglobin conc. 34 29 - 35 pg       Imaging:  Xray Result (most recent):  XR CHEST PORTABLE 06/17/2023    Narrative  Chest X-ray    INDICATION: Cough    COMPARISON: 15 June 2023  TECHNIQUE: AP/PA view of the chest was obtained.    FINDINGS: Peripherally located opacities noted at the left lung base. There are  no infiltrates or effusions.  The heart size is normal.  The  bony thorax is  intact.    Impression  Very subtle peripherally located opacities noted at the left lung  base which could represent early infectious process. Recommend clinical  correlation, treatment and follow-up radiograph in 4 to 6 weeks to document  resolution      Electronically signed by Barnett Abu    CT Result (most recent):  CT CHEST PULMONARY EMBOLISM W CONTRAST 06/13/2023    Narrative  CT CHEST PULMONARY EMBOLISM W CONTRAST    HISTORY:hypoxia, hx of PE, sickle cell crisis.    COMPARISON: 05/27/2022    TECHNIQUE: Following the noncontrasted scout, axial images of the thorax were  obtained following infusion of 100 cc of Isovue 370.  Post-processing on an  independent workstation was performed to reconstruct MIP images for evaluation  of the thoracic vasculature.    FINDINGS: There is no pulmonary embolism, aortic dissection, thoracic aneurysm  or pericardial fluid. 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    Discoid atelectasis in the lingula segment, right middle lobe and both lower  lobes is stable. The lung parenchyma is otherwise clear. No pleural effusion or  pneumothorax is noted. There is no axillary, mediastinal or hilar adenopathy.    Limited evaluation of the upper abdomen demonstrates no gross abnormalities.    Review of bone windows demonstrates no osteoblastic or lytic lesions.    Impression  1.  There is no pulmonary embolism, aortic dissection, pericardial fluid or  thoracic aneurysm.  2.  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  3.  No acute lung findings. Discoid atelectasis in the lingula segment, right  middle  lobe and both lower lobes is stable.        All CT scans at this facility are performed using low  dose modulation  techniques as appropriate to perform exam including the following: automated  exposure control; use of iterative reconstruction technique; adjustment  of the  mA and/or kV according to patient size (this includes techniques or standardized  protocols for targeted exams where dose is matched to indication/reason for  exam).              Electronically signed by Georgiann Mccoy          ASSESSMENT:  Principal Problem:    Sickle cell crisis (HCC)  Active Problems:    Diffuse pain in left upper extremity    Drowsiness    Hypoxia    Neck swelling  Resolved Problems:    * No resolved hospital problems. *    Ms. Gayton is a 47 y.o. female admitted on 06/12/2023. The primary encounter diagnosis was Sickle cell crisis (HCC). 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  She is a patient of Dr. Welton Flakes followed for sickle cell disease on folic acid 1mg  daily and Hydrea 1000mg  daily.  She presented to ED with c/o "pain all over" for the past four days.  She has been helping family move for the past 5 days.  Out of dilaudid at home.  She reports some chest discomfort and shortness of breath.  Denies cough or fever.  CXR neg.  UA pending.  RVP pending.  Hgb 7.3 (b/l ~7-8).  She is being admitted for further management of sickle cell crisis.      PLAN:  Sickle cell crisis  - con't folic acid, hydrea  - RVP pending.  CXR neg.  CTA chest ordered.  - UA pending.  - D5 1/2 NS @ / hr  - No IV benadryl or IV phenergan  - Pain mgmt  7/11 Pain ongoing.  Toradol ordered.  Hgb down to 6.6.  Hold off on transfusion at this time.  UA not c/w UTI.    7/12 Pain ongoing, but controlled on currently regimen.  On toradol.  Requiring IV dilaudid.  Hgb down to 6.0.  Transfuse 1 unit PRBCs.  7/13 Hgb 7.4 after transfusion. On toradol every 6 hours. Hold IV Dilaudid and change to PO in preparation for discharge.   7/14 Hgb 7.4. Doing okay on PO Dilaudid.  Complains of chest pain - obtain EKG.      Hypoxia/Infiltrate   7/10 On Airvo @ 60L.  ABG results reviewed.  CTA chest pending.  RVP pending.  Consult pulm.  7/11 Airvo down to 45L, sat 100%.  RVP neg.  CTA chest neg for acute findings; shows atelectasis.  Pulm consult pending.  IS ordered.  7/12 On O2 @ 1L.  O2 qualifier ordered.  Pulm following.  Repeat CXR.  7/13 Off O2 and saturating well.   7/14 Repeat CXR this morning with possible opacity left lung base - start azithromycin 500 mg now and then 250 mg x 4 days.     LUE pain/Bilateral Neck Edema  - LUE dopp ordered  7/11 LUE dopp neg  7/13 right and left upper extremity doppler negative for DVT in upper extremities and bilateral IJ.      Hx SVC  thrombosis / RUE DVT / PE  - con't Xarelto        Continue home meds  Supportive care  On Xarelto    Dispo:  TBD pending clinical course - hopefully home tomorrow.               Laural Roes, NP-C   [facility]  [address]  [place] [address]  Office : [phone]  Fax : [phone]

## 2023-06-17 NOTE — Plan of Care (Signed)
Problem: Pain  Goal: Verbalizes/displays adequate comfort level or baseline comfort level  Outcome: Progressing     Problem: Safety - Adult  Goal: Free from fall injury  Outcome: Progressing     Problem: ABCDS Injury Assessment  Goal: Absence of physical injury  Outcome: Progressing

## 2023-06-18 LAB — COMPREHENSIVE METABOLIC PANEL
ALT: 9 U/L — ABNORMAL LOW (ref 12–65)
AST: 24 U/L (ref 15–37)
Albumin/Globulin Ratio: 0.8 — ABNORMAL LOW (ref 1.0–1.9)
Albumin: 3 g/dL — ABNORMAL LOW (ref 3.5–5.0)
Alk Phosphatase: 97 U/L (ref 35–104)
Anion Gap: 10 mmol/L (ref 9–18)
BUN: 7 MG/DL (ref 6–23)
CO2: 22 mmol/L (ref 20–28)
Calcium: 9 MG/DL (ref 8.8–10.2)
Chloride: 106 mmol/L (ref 98–107)
Creatinine: 0.85 MG/DL (ref 0.60–1.10)
Est, Glom Filt Rate: 84 mL/min/{1.73_m2} (ref >60)
Globulin: 3.8 g/dL — ABNORMAL HIGH (ref 2.3–3.5)
Glucose: 94 mg/dL (ref 70–99)
Potassium: 3.3 mmol/L — ABNORMAL LOW (ref 3.5–5.1)
Sodium: 138 mmol/L (ref 136–145)
Total Bilirubin: 1 MG/DL (ref 0.0–1.2)
Total Protein: 6.8 g/dL (ref 6.3–8.2)

## 2023-06-18 LAB — EKG 12-LEAD
Atrial Rate: 75 {beats}/min
Diagnosis: NORMAL
P Axis: 66 degrees
P-R Interval: 186 ms
Q-T Interval: 390 ms
QRS Duration: 78 ms
QTc Calculation (Bazett): 435 ms
R Axis: 26 degrees
T Axis: 41 degrees
Ventricular Rate: 75 {beats}/min

## 2023-06-18 LAB — CBC WITH AUTO DIFFERENTIAL
Basophils %: 0 % (ref 0.0–2.0)
Basophils Absolute: 0 10*3/uL (ref 0.0–0.2)
Eosinophils %: 2 % (ref 0.5–7.8)
Eosinophils Absolute: 0.1 10*3/uL (ref 0.0–0.8)
Hematocrit: 20.1 % — ABNORMAL LOW (ref 35.8–46.3)
Hemoglobin: 7.1 g/dL — ABNORMAL LOW (ref 11.7–15.4)
Immature Granulocytes %: 0 % (ref 0.0–5.0)
Immature Granulocytes Absolute: 0 10*3/uL (ref 0.0–0.5)
Lymphocytes %: 22 % (ref 13–44)
Lymphocytes Absolute: 1.2 10*3/uL (ref 0.5–4.6)
MCH: 39.9 PG — ABNORMAL HIGH (ref 26.1–32.9)
MCHC: 35.3 g/dL — ABNORMAL HIGH (ref 31.4–35.0)
MCV: 112.9 FL — ABNORMAL HIGH (ref 82–102)
MPV: 9.9 FL (ref 9.4–12.3)
Monocytes %: 9 % (ref 4.0–12.0)
Monocytes Absolute: 0.5 10*3/uL (ref 0.1–1.3)
Neutrophils %: 67 % (ref 43–78)
Neutrophils Absolute: 3.8 10*3/uL (ref 1.7–8.2)
Platelet Comment: ADEQUATE
Platelets: 308 10*3/uL (ref 150–450)
RBC: 1.78 M/uL — ABNORMAL LOW (ref 4.05–5.2)
WBC: 5.6 10*3/uL (ref 4.3–11.1)
nRBC: 1.06 10*3/uL — ABNORMAL HIGH (ref 0.0–0.2)

## 2023-06-18 LAB — RETICULOCYTES
Absolute Retic #: 0.0913 M/ul (ref 0.026–0.095)
Immature Retic Fraction: 22.2 % — ABNORMAL HIGH (ref 3.0–15.9)
Retic Hemoglobin conc.: 36 pg — ABNORMAL HIGH (ref 29–35)
Reticulocyte Count,Automated: 5.1 % — ABNORMAL HIGH (ref 0.3–2.0)

## 2023-06-18 LAB — MAGNESIUM: Magnesium: 1.7 mg/dL — ABNORMAL LOW (ref 1.8–2.4)

## 2023-06-18 MED ORDER — POTASSIUM CHLORIDE CRYS ER 20 MEQ PO TBCR
20 | Freq: Two times a day (BID) | ORAL | Status: DC
Start: 2023-06-18 — End: 2023-06-20
  Administered 2023-06-18 – 2023-06-20 (×5): 20 meq via ORAL

## 2023-06-18 MED ORDER — MAGNESIUM OXIDE -MG SUPPLEMENT 400 (240 MG) MG PO TABS
400 | Freq: Every day | ORAL | Status: DC
Start: 2023-06-18 — End: 2023-06-19
  Administered 2023-06-18: 19:00:00 400 mg via ORAL

## 2023-06-18 MED FILL — AZITHROMYCIN 250 MG PO TABS: 250 MG | ORAL | Qty: 1

## 2023-06-18 MED FILL — POTASSIUM CHLORIDE CRYS ER 20 MEQ PO TBCR: 20 MEQ | ORAL | Qty: 1

## 2023-06-18 MED FILL — LEVETIRACETAM 500 MG PO TABS: 500 MG | ORAL | Qty: 3

## 2023-06-18 MED FILL — FOLIC ACID 1 MG PO TABS: 1 MG | ORAL | Qty: 1

## 2023-06-18 MED FILL — KETOROLAC TROMETHAMINE 30 MG/ML IJ SOLN: 30 MG/ML | INTRAMUSCULAR | Qty: 1

## 2023-06-18 MED FILL — CETIRIZINE HCL 10 MG PO TABS: 10 MG | ORAL | Qty: 1

## 2023-06-18 MED FILL — HYDROMORPHONE HCL 2 MG PO TABS: 2 MG | ORAL | Qty: 1

## 2023-06-18 MED FILL — FLUOXETINE HCL 20 MG PO CAPS: 20 MG | ORAL | Qty: 2

## 2023-06-18 MED FILL — HYDREA 500 MG PO CAPS: 500 MG | ORAL | Qty: 2

## 2023-06-18 MED FILL — XARELTO 20 MG PO TABS: 20 MG | ORAL | Qty: 1

## 2023-06-18 MED FILL — MAGNESIUM OXIDE -MG SUPPLEMENT 400 (240 MG) MG PO TABS: 400 (240 Mg) MG | ORAL | Qty: 1

## 2023-06-18 NOTE — Plan of Care (Signed)
Problem: Pain  Goal: Verbalizes/displays adequate comfort level or baseline comfort level  06/18/2023 0956 by Everlene Balls, RN  Outcome: Progressing  06/17/2023 2339 by Cherlynn Polo, RN  Outcome: Progressing     Problem: Safety - Adult  Goal: Free from fall injury  06/18/2023 0956 by Everlene Balls, RN  Outcome: Progressing  06/17/2023 2339 by Cherlynn Polo, RN  Outcome: Progressing     Problem: ABCDS Injury Assessment  Goal: Absence of physical injury  06/17/2023 2339 by Cherlynn Polo, RN  Outcome: Progressing

## 2023-06-18 NOTE — Plan of Care (Signed)
Problem: Pain  Goal: Verbalizes/displays adequate comfort level or baseline comfort level  Outcome: Progressing     Problem: Safety - Adult  Goal: Free from fall injury  Outcome: Progressing     Problem: Skin/Tissue Integrity - Adult  Goal: Skin integrity remains intact  Outcome: Progressing

## 2023-06-18 NOTE — Care Coordination-Inpatient (Signed)
LOS 5    Admitted: Sickle Cell Crisis  24 Hour Events:  Afebrile, VSS.  Place on 2L NC per Dr. Threasa Beards for crisis, not o2 saturation.  Pain somewhat better, two PO Dilaudids yesterday and also two this AM.   Hgb stable at 7.1  Bilateral neck edema improved with lowering rate of IVF - area of chronic occlusion and will consult IR.  CXR yesterday with possible opacity left lung base - started azithromycin    TOC is home with family. No anticipated needs at this time.

## 2023-06-18 NOTE — Progress Notes (Signed)
[facility]        Inpatient Hematology / Oncology Progress Note    Reason for Admission:  Sickle cell crisis (HCC) [D57.00]  Drowsiness [R40.0]  Hypoxia [R09.02]  Diffuse pain in left upper extremity [M79.602]    24 Hour Events:  Afebrile, VSS.  Place on 2L NC per Dr. Threasa Beards for crisis, not o2 saturation.  Pain somewhat better, two PO Dilaudids yesterday and also two this AM.   Hgb stable at 7.1  Bilateral neck edema improved with lowering rate of IVF - area of chronic occlusion and will consult IR.  CXR yesterday with possible opacity left lung base - started azithromycin     Transfusions: None  Replacements: PO Mg and PO K.       ROS:  Constitutional: Negative for fever, chills.  CV: Negative for palpitations, edema. + on and off chest pain.   Respiratory: +cough. Negative for wheezing.  GI: Negative for abdominal pain, diarrhea.    10 point review of systems is otherwise negative with the exception of the elements mentioned above in the HPI.           Allergies   Allergen Reactions    Latex Rash    Ceftriaxone Other (See Comments)    Fentanyl Other (See Comments)     "my doctor said I had a stroke from it"    Influenza Vaccines Itching     unknown      Influenza Virus Vaccine Swelling    Meperidine Other (See Comments)    Morphine Itching    Oxycodone Other (See Comments)    Sulfa Antibiotics Rash     Past Medical History:   Diagnosis Date    Arthritis     Hx of blood clots     Migraines     Seizures (HCC)     Sickle cell disease (HCC)     Stroke (HCC)      Past Surgical History:   Procedure Laterality Date    CESAREAN SECTION      CHOLECYSTECTOMY      LAPAROTOMY      TUBAL LIGATION      VASCULAR SURGERY       Family History   Problem Relation Age of Onset    Diabetes Mother     Hypertension Mother     Diabetes Father     Hypertension Father      Social History     Socioeconomic History    Marital status: Single     Spouse name: Not on file    Number of children: Not on file    Years of  education: Not on file    Highest education level: Not on file   Occupational History    Not on file   Tobacco Use    Smoking status: Never     Passive exposure: Never    Smokeless tobacco: Never   Substance and Sexual Activity    Alcohol use: Not Currently    Drug use: Never    Sexual activity: Not Currently   Other Topics Concern    Not on file   Social History Narrative    Not on file     Social Determinants of Health     Financial Resource Strain: Not on file   Food Insecurity: No Food Insecurity (06/13/2023)    Hunger Vital Sign     Worried About Running Out of Food in the Last Year: Never true  Ran Out of Food in the Last Year: Never true   Transportation Needs: No Transportation Needs (06/13/2023)    PRAPARE - Therapist, art (Medical): No     Lack of Transportation (Non-Medical): No   Physical Activity: Not on file   Stress: Not on file   Social Connections: Not on file   Intimate Partner Violence: Not on file   Housing Stability: Low Risk  (06/13/2023)    Housing Stability Vital Sign     Unable to Pay for Housing in the Last Year: No     Number of Places Lived in the Last Year: 1     Unstable Housing in the Last Year: No     Current Facility-Administered Medications   Medication Dose Route Frequency Provider Last Rate Last Admin    azithromycin (ZITHROMAX) tablet 250 mg  250 mg Oral Daily Albin Fischer Kor[place], NP-C   250 mg at 06/18/23 0920    HYDROmorphone (DILAUDID) tablet 2 mg  2 mg Oral Q4H PRN Fredderick Phenix, NP-C   2 mg at 06/18/23 0919    0.9 % sodium chloride infusion   IntraVENous PRN Trisha Mangle, APRN - CNP        acetaminophen (TYLENOL) tablet 650 mg  650 mg Oral See Admin Instructions Trisha Mangle, APRN - CNP   650 mg at 06/15/23 0981    diphenhydrAMINE (BENADRYL) capsule 25 mg  25 mg Oral See Admin Instructions Trisha Mangle, APRN - CNP   25 mg at 06/15/23 0908    ketorolac (TORADOL) injection 30 mg  30 mg IntraVENous Q6H Trisha Mangle, APRN - CNP   30 mg at  06/18/23 0920    cetirizine (ZYRTEC) tablet 10 mg  10 mg Oral Daily Trisha Mangle, APRN - CNP   10 mg at 06/18/23 0920    FLUoxetine (PROZAC) capsule 40 mg  40 mg Oral Daily Trisha Mangle, APRN - CNP   40 mg at 06/18/23 1914    folic acid (FOLVITE) tablet 1 mg  1 mg Oral Daily Trisha Mangle, APRN - CNP   1 mg at 06/18/23 0920    hydroxyurea (HYDREA) chemo capsule 1,000 mg  1,000 mg Oral Daily Trisha Mangle, APRN - CNP   1,000 mg at 06/18/23 0914    levETIRAcetam (KEPPRA) tablet 1,500 mg  1,500 mg Oral BID Trisha Mangle, APRN - CNP   1,500 mg at 06/18/23 0920    rivaroxaban (XARELTO) tablet 20 mg  20 mg Oral Daily with breakfast Trisha Mangle, APRN - CNP   20 mg at 06/18/23 0920    sodium chloride flush 0.9 % injection 5-40 mL  5-40 mL IntraVENous 2 times per day Trisha Mangle, APRN - CNP   10 mL at 06/18/23 0923    sodium chloride flush 0.9 % injection 5-40 mL  5-40 mL IntraVENous PRN Trisha Mangle, APRN - CNP        0.9 % sodium chloride infusion   IntraVENous PRN Trisha Mangle, APRN - CNP        polyethylene glycol (GLYCOLAX) packet 17 g  17 g Oral Daily PRN Trisha Mangle, APRN - CNP        dextrose 5 % and 0.45 % sodium chloride infusion   IntraVENous Continuous Fredderick Phenix, NP-C 75 mL/hr at 06/18/23 1110 New Bag at 06/18/23 1110    acetaminophen (TYLENOL) tablet 650 mg  650 mg Oral Q6H PRN Trisha Mangle, APRN - CNP   650 mg at  06/14/23 0835    ondansetron (ZOFRAN-ODT) disintegrating tablet 4 mg  4 mg Oral Q6H PRN Trisha Mangle, APRN - CNP   4 mg at 06/13/23 1727    Or    ondansetron (ZOFRAN) injection 4 mg  4 mg IntraVENous Q6H PRN Trisha Mangle, APRN - CNP        prochlorperazine (COMPAZINE) tablet 10 mg  10 mg Oral Q6H PRN Trisha Mangle, APRN - CNP        Or    prochlorperazine (COMPAZINE) injection 10 mg  10 mg IntraVENous Q6H PRN Trisha Mangle, APRN - CNP        [Held by provider] HYDROmorphone HCl PF (DILAUDID) injection 1 mg  1 mg IntraVENous Q3H PRN Trisha Mangle, APRN - CNP   1 mg at 06/16/23 0731    naloxone (NARCAN)  injection 0.4 mg  0.4 mg IntraVENous PRN Trisha Mangle, APRN - CNP           OBJECTIVE:  Patient Vitals for the past 8 hrs:   BP Temp Temp src Pulse Resp SpO2   06/18/23 0739 131/69 97.9 F (36.6 C) Oral 74 16 97 %       Temp (24hrs), Avg:98.1 F (36.7 C), Min:97.9 F (36.6 C), Max:98.2 F (36.8 C)    07/15 0701 - 07/15 1900  In: 0   Out: 2800 [Urine:2800]    Physical Exam:  Constitutional: Acutely ill-appearing female in no acute distress, sitting comfortably in the hospital bed.    HEENT: Normocephalic and atraumatic. Oropharynx is clear, mucous membranes are moist.  Extraocular muscles are intact.  Sclerae anicteric. Neck supple.    Skin Warm and dry.  No bruising and no rash noted.  No erythema.  No pallor.    Neuro Grossly nonfocal with no obvious sensory or motor deficits.   MSK C/o back pain and L side pain.    Psych Appropriate mood and affect.    Full exam per attending MD    Labs:    Recent Results (from the past 24 hour(s))   Comprehensive Metabolic Panel    Collection Time: 06/18/23  4:54 AM   Result Value Ref Range    Sodium 138 136 - 145 mmol/L    Potassium 3.3 (L) 3.5 - 5.1 mmol/L    Chloride 106 98 - 107 mmol/L    CO2 22 20 - 28 mmol/L    Anion Gap 10 9 - 18 mmol/L    Glucose 94 70 - 99 mg/dL    BUN 7 6 - 23 MG/DL    Creatinine 1.61 0.96 - 1.10 MG/DL    Est, Glom Filt Rate 84 >60 ml/min/1.25m2    Calcium 9.0 8.8 - 10.2 MG/DL    Total Bilirubin 1.0 0.0 - 1.2 MG/DL    ALT 9 (L) 12 - 65 U/L    AST 24 15 - 37 U/L    Alk Phosphatase 97 35 - 104 U/L    Total Protein 6.8 6.3 - 8.2 g/dL    Albumin 3.0 (L) 3.5 - 5.0 g/dL    Globulin 3.8 (H) 2.3 - 3.5 g/dL    Albumin/Globulin Ratio 0.8 (L) 1.0 - 1.9     Magnesium    Collection Time: 06/18/23  4:54 AM   Result Value Ref Range    Magnesium 1.7 (L) 1.8 - 2.4 mg/dL   CBC with Auto Differential    Collection Time: 06/18/23  4:54 AM   Result Value Ref Range    WBC 5.6 4.3 -  11.1 K/uL    RBC 1.78 (L) 4.05 - 5.2 M/uL    Hemoglobin 7.1 (L) 11.7 - 15.4 g/dL     Hematocrit 69.6 (L) 35.8 - 46.3 %    MCV 112.9 (H) 82 - 102 FL    MCH 39.9 (H) 26.1 - 32.9 PG    MCHC 35.3 (H) 31.4 - 35.0 g/dL    Platelets 295 284 - 450 K/uL    MPV 9.9 9.4 - 12.3 FL    nRBC 1.06 (H) 0.0 - 0.2 K/uL    Neutrophils % 67 43 - 78 %    Lymphocytes % 22 13 - 44 %    Monocytes % 9 4.0 - 12.0 %    Eosinophils % 2 0.5 - 7.8 %    Basophils % 0 0.0 - 2.0 %    Immature Granulocytes % 0 0.0 - 5.0 %    Neutrophils Absolute 3.8 1.7 - 8.2 K/UL    Lymphocytes Absolute 1.2 0.5 - 4.6 K/UL    Monocytes Absolute 0.5 0.1 - 1.3 K/UL    Eosinophils Absolute 0.1 0.0 - 0.8 K/UL    Basophils Absolute 0.0 0.0 - 0.2 K/UL    Immature Granulocytes Absolute 0.0 0.0 - 0.5 K/UL    RBC Comment ANISOCYTOSIS + POIKILOCYTOSIS      RBC Comment MODERATE  TARGET CELLS        RBC Comment OCCASIONAL  POLYCHROMASIA        WBC Comment Result Confirmed By Smear      Platelet Comment ADEQUATE      Differential Type AUTOMATED     Reticulocytes    Collection Time: 06/18/23  4:54 AM   Result Value Ref Range    Reticulocyte Count,Automated 5.1 (H) 0.3 - 2.0 %    Absolute Retic # [id number] 0.026 - 0.095 M/ul    Immature Retic Fraction 22.2 (H) 3.0 - 15.9 %    Retic Hemoglobin conc. 36 (H) 29 - 35 pg       Imaging:  Xray Result (most recent):  XR CHEST PORTABLE 06/17/2023    Narrative  Chest X-ray    INDICATION: Cough    COMPARISON: 15 June 2023  TECHNIQUE: AP/PA view of the chest was obtained.    FINDINGS: Peripherally located opacities noted at the left lung base. There are  no infiltrates or effusions.  The heart size is normal.  The bony thorax is  intact.    Impression  Very subtle peripherally located opacities noted at the left lung  base which could represent early infectious process. Recommend clinical  correlation, treatment and follow-up radiograph in 4 to 6 weeks to document  resolution      Electronically signed by Barnett Abu    CT Result (most recent):  CT CHEST PULMONARY EMBOLISM W CONTRAST 06/13/2023    Narrative  CT CHEST  PULMONARY EMBOLISM W CONTRAST    HISTORY:hypoxia, hx of PE, sickle cell crisis.    COMPARISON: 05/27/2022    TECHNIQUE: Following the noncontrasted scout, axial images of the thorax were  obtained following infusion of 100 cc of Isovue 370.  Post-processing on an  independent workstation was performed to reconstruct MIP images for evaluation  of the thoracic vasculature.    FINDINGS: There is no pulmonary embolism, aortic dissection, thoracic aneurysm  or pericardial fluid. 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    Discoid atelectasis in the lingula segment, right middle lobe and both lower  lobes is stable. The lung parenchyma is otherwise clear. No pleural effusion or  pneumothorax is noted. There is no axillary, mediastinal or hilar adenopathy.    Limited evaluation of the upper abdomen demonstrates no gross abnormalities.    Review of bone windows demonstrates no osteoblastic or lytic lesions.    Impression  1.  There is no pulmonary embolism, aortic dissection, pericardial fluid or  thoracic aneurysm.  2.  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  3.  No acute lung findings. Discoid atelectasis in the lingula segment, right  middle lobe and both lower lobes is stable.        All CT scans at this facility are performed using low  dose modulation  techniques as appropriate to perform exam including the following: automated  exposure control; use of iterative reconstruction technique; adjustment of the  mA and/or kV according to patient size (this includes techniques or standardized  protocols for targeted exams where dose is matched to indication/reason for  exam).              Electronically signed by Georgiann Mccoy          ASSESSMENT:  Principal Problem:    Sickle cell crisis (HCC)  Active Problems:    Diffuse pain in left upper extremity    Drowsiness    Hypoxia    Neck swelling    Other chest  pain  Resolved Problems:    * No resolved hospital problems. *    [name] Bright is a 47 y.o. female admitted on 06/12/2023. The primary encounter diagnosis was Sickle cell crisis (HCC). 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  She is a patient of Dr. Welton Flakes followed for sickle cell disease on folic acid 1mg  daily and Hydrea 1000mg  daily.  She presented to ED with c/o "pain all over" for the past four days.  She has been helping family move for the past 5 days.  Out of dilaudid at home.  She reports some chest discomfort and shortness of breath.  Denies cough or fever.  CXR neg.  UA pending.  RVP pending.  Hgb 7.3 (b/l ~7-8).  She is being admitted for further management of sickle cell crisis.      PLAN:  Sickle cell crisis  - con't folic acid, hydrea  - RVP pending.  CXR neg.  CTA chest ordered.  - UA pending.  - D5 1/2 NS @ / hr  - No IV benadryl or IV phenergan  - Pain mgmt  7/11 Pain ongoing.  Toradol ordered.  Hgb down to 6.6.  Hold off on transfusion at this time.  UA not c/w UTI.    7/12 Pain ongoing, but controlled on currently regimen.  On toradol.  Requiring IV dilaudid.  Hgb down to 6.0.  Transfuse 1 unit PRBCs.  7/13 Hgb 7.4 after transfusion. On toradol every 6 hours. Hold IV Dilaudid and change to PO in preparation for discharge.   7/14 Hgb 7.4. Doing okay on PO Dilaudid.  Complains of chest pain - obtain EKG.   7/15 Pain controlled on PO Dilaudid and IV toradol. Pain today is mostly in her back and L side. Order to place on 2L NC per Dr. Threasa Beards.     Hypoxia/Infiltrate  7/10 On Airvo @ 60L.  ABG results reviewed.  CTA chest pending.  RVP pending.  Consult pulm.  7/11 Airvo down to 45L, sat 100%.  RVP neg.  CTA chest neg for acute findings; shows atelectasis.  Pulm consult pending.  IS ordered.  7/12 On O2  @ 1L.  O2 qualifier ordered.  Pulm following.  Repeat CXR.  7/13 Off O2 and saturating well.   7/14 Repeat CXR this morning with possible opacity left lung base - start azithromycin 500 mg now and then 250 mg x 4 days.   7/15 Continue Azithromycin. On room air.     LUE pain/Bilateral Neck Edema  - LUE dopp ordered  7/11 LUE dopp neg  7/13 right and left upper extremity doppler negative for DVT in upper extremities and bilateral IJ.      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 Consult placed.         Continue home meds  Supportive care  On Xarelto    Dispo:  TBD pending clinical course - hopefully home tomorrow.               Particia Lather, APRN - [facility]   [facility]  [address]  [place] [address]  Office : [phone]  Fax : [phone]   I personally saw, exammed and counselled the patient, and discussed with NP, agree with above history/assessment/plan. Exam: No acute distress, normal breathing effort and breath sounds, regular heart rate and heart sound, benign abd, normal ROM of limbs. 47 y.o.female admitted for sickle cell pain crisis, pain persists and still trying to convert to p.o. regimen with IV Dilaudid as needed, continue oxygen, IV fluid, antibiotics, supportive SOP.        Omer Jack, M.D.  [address] [facility]  [address]  [place], [place] [address]  Office : [phone]  Fax : [phone]

## 2023-06-19 ENCOUNTER — Inpatient Hospital Stay
Admit: 2023-06-19 | Payer: MEDICARE | Attending: Anesthesiology | Primary: Student in an Organized Health Care Education/Training Program

## 2023-06-19 LAB — HEMOGLOBIN FRACTIONATION, REFLEX
Hemoglobin A: 0 % — ABNORMAL LOW (ref 96.4–98.8)
Hemoglobin C: 0 %
Hemoglobin E%: 0 %
Hemoglobin F: 23.2 % — ABNORMAL HIGH (ref 0.0–2.0)
Hemoglobin S: 74.8 % — ABNORMAL HIGH
Hemoglobin Variant: 0 %
Hgb Solubility: POSITIVE — AB

## 2023-06-19 LAB — PREPARE RBC (CROSSMATCH)

## 2023-06-19 LAB — COMPREHENSIVE METABOLIC PANEL
ALT: 7 U/L — ABNORMAL LOW (ref 12–65)
AST: 22 U/L (ref 15–37)
Albumin/Globulin Ratio: 0.8 — ABNORMAL LOW (ref 1.0–1.9)
Albumin: 3 g/dL — ABNORMAL LOW (ref 3.5–5.0)
Alk Phosphatase: 93 U/L (ref 35–104)
Anion Gap: 9 mmol/L (ref 9–18)
BUN: 8 MG/DL (ref 6–23)
CO2: 23 mmol/L (ref 20–28)
Calcium: 9 MG/DL (ref 8.8–10.2)
Chloride: 109 mmol/L — ABNORMAL HIGH (ref 98–107)
Creatinine: 0.93 MG/DL (ref 0.60–1.10)
Est, Glom Filt Rate: 76 mL/min/{1.73_m2} (ref >60)
Globulin: 3.8 g/dL — ABNORMAL HIGH (ref 2.3–3.5)
Glucose: 101 mg/dL — ABNORMAL HIGH (ref 70–99)
Potassium: 3.7 mmol/L (ref 3.5–5.1)
Sodium: 141 mmol/L (ref 136–145)
Total Bilirubin: 0.9 MG/DL (ref 0.0–1.2)
Total Protein: 6.8 g/dL (ref 6.3–8.2)

## 2023-06-19 LAB — CBC WITH AUTO DIFFERENTIAL
Basophils %: 0 % (ref 0.0–2.0)
Basophils Absolute: 0 10*3/uL (ref 0.0–0.2)
Eosinophils %: 3 % (ref 0.5–7.8)
Eosinophils Absolute: 0.1 10*3/uL (ref 0.0–0.8)
Hematocrit: 17.2 % — ABNORMAL LOW (ref 35.8–46.3)
Hemoglobin: 6.1 g/dL — CL (ref 11.7–15.4)
Immature Granulocytes %: 0 % (ref 0.0–5.0)
Immature Granulocytes Absolute: 0 10*3/uL (ref 0.0–0.5)
Lymphocytes %: 32 % (ref 13–44)
Lymphocytes Absolute: 1.6 10*3/uL (ref 0.5–4.6)
MCH: 40.9 PG — ABNORMAL HIGH (ref 26.1–32.9)
MCHC: 35.5 g/dL — ABNORMAL HIGH (ref 31.4–35.0)
MCV: 115.4 FL — ABNORMAL HIGH (ref 82–102)
MPV: 9.7 FL (ref 9.4–12.3)
Monocytes %: 9 % (ref 4.0–12.0)
Monocytes Absolute: 0.4 10*3/uL (ref 0.1–1.3)
Neutrophils %: 56 % (ref 43–78)
Neutrophils Absolute: 2.8 10*3/uL (ref 1.7–8.2)
Platelet Comment: ADEQUATE
Platelets: 305 10*3/uL (ref 150–450)
RBC: 1.49 M/uL — ABNORMAL LOW (ref 4.05–5.2)
WBC: 4.9 10*3/uL (ref 4.3–11.1)
nRBC: 1.28 10*3/uL — ABNORMAL HIGH (ref 0.0–0.2)

## 2023-06-19 LAB — HEMOGLOBINOPATHY EVALUATION: Hemoglobin A2: 2 % (ref 1.8–3.2)

## 2023-06-19 LAB — RETICULOCYTES
Absolute Retic #: 0.0885 M/ul (ref 0.026–0.095)
Immature Retic Fraction: 15.8 % (ref 3.0–15.9)
Retic Hemoglobin conc.: 37 pg — ABNORMAL HIGH (ref 29–35)
Reticulocyte Count,Automated: 5.9 % — ABNORMAL HIGH (ref 0.3–2.0)

## 2023-06-19 LAB — MAGNESIUM: Magnesium: 1.7 mg/dL — ABNORMAL LOW (ref 1.8–2.4)

## 2023-06-19 MED ORDER — DIPHENHYDRAMINE HCL 25 MG PO CAPS
25 | ORAL | Status: DC
Start: 2023-06-19 — End: 2023-06-20

## 2023-06-19 MED ORDER — DIPHENHYDRAMINE HCL 50 MG/ML IJ SOLN
50 | INTRAMUSCULAR | Status: AC
Start: 2023-06-19 — End: ?

## 2023-06-19 MED ORDER — SODIUM CHLORIDE 0.9 % IV SOLN
0.9 | INTRAVENOUS | Status: DC | PRN
Start: 2023-06-19 — End: 2023-06-20

## 2023-06-19 MED ORDER — ACETAMINOPHEN 325 MG PO TABS
325 | ORAL | Status: AC
Start: 2023-06-19 — End: ?

## 2023-06-19 MED ORDER — ACETAMINOPHEN 325 MG PO TABS
325 | ORAL | Status: AC
Start: 2023-06-19 — End: 2023-06-19
  Administered 2023-06-19: 15:00:00 650 mg via ORAL

## 2023-06-19 MED ORDER — AMOXICILLIN-POT CLAVULANATE 875-125 MG PO TABS
875-125 | Freq: Two times a day (BID) | ORAL | Status: DC
Start: 2023-06-19 — End: 2023-06-20
  Administered 2023-06-19 – 2023-06-20 (×3): 1 via ORAL

## 2023-06-19 MED ORDER — MAGNESIUM OXIDE -MG SUPPLEMENT 400 (240 MG) MG PO TABS
400 | Freq: Two times a day (BID) | ORAL | Status: DC
Start: 2023-06-19 — End: 2023-06-20
  Administered 2023-06-19 – 2023-06-20 (×3): 400 mg via ORAL

## 2023-06-19 MED FILL — LEVETIRACETAM 500 MG PO TABS: 500 MG | ORAL | Qty: 3

## 2023-06-19 MED FILL — FLUOXETINE HCL 20 MG PO CAPS: 20 MG | ORAL | Qty: 2

## 2023-06-19 MED FILL — XARELTO 20 MG PO TABS: 20 MG | ORAL | Qty: 1

## 2023-06-19 MED FILL — KETOROLAC TROMETHAMINE 30 MG/ML IJ SOLN: 30 MG/ML | INTRAMUSCULAR | Qty: 1

## 2023-06-19 MED FILL — POTASSIUM CHLORIDE CRYS ER 20 MEQ PO TBCR: 20 MEQ | ORAL | Qty: 1

## 2023-06-19 MED FILL — DIPHENHYDRAMINE HCL 50 MG/ML IJ SOLN: 50 MG/ML | INTRAMUSCULAR | Qty: 1

## 2023-06-19 MED FILL — DIPHENHYDRAMINE HCL 25 MG PO CAPS: 25 MG | ORAL | Qty: 1

## 2023-06-19 MED FILL — AMOXICILLIN-POT CLAVULANATE 875-125 MG PO TABS: 875-125 MG | ORAL | Qty: 1

## 2023-06-19 MED FILL — HYDREA 500 MG PO CAPS: 500 MG | ORAL | Qty: 2

## 2023-06-19 MED FILL — ACETAMINOPHEN 325 MG PO TABS: 325 MG | ORAL | Qty: 2

## 2023-06-19 MED FILL — FOLIC ACID 1 MG PO TABS: 1 MG | ORAL | Qty: 1

## 2023-06-19 MED FILL — AZITHROMYCIN 250 MG PO TABS: 250 MG | ORAL | Qty: 1

## 2023-06-19 MED FILL — CETIRIZINE HCL 10 MG PO TABS: 10 MG | ORAL | Qty: 1

## 2023-06-19 MED FILL — HYDROMORPHONE HCL 2 MG PO TABS: 2 MG | ORAL | Qty: 1

## 2023-06-19 MED FILL — MAGNESIUM OXIDE -MG SUPPLEMENT 400 (240 MG) MG PO TABS: 400 (240 Mg) MG | ORAL | Qty: 1

## 2023-06-19 NOTE — Progress Notes (Signed)
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  CV: Negative for palpitations, edema, chest pain.   Respiratory: +cough. Negative for wheezing.  GI: Negative for abdominal pain, diarrhea.    10 point review of systems is otherwise negative with the exception of the elements mentioned above in the HPI.           Allergies   Allergen Reactions    Latex Rash    Ceftriaxone Other (See Comments)    Fentanyl Other (See Comments)     "my doctor said I had a stroke from it"    Influenza Vaccines Itching     unknown      Influenza Virus Vaccine Swelling    Meperidine Other (See Comments)    Morphine Itching    Oxycodone Other (See Comments)    Sulfa Antibiotics Rash     Past Medical History:   Diagnosis Date    Arthritis     Hx of blood clots     Migraines     Seizures (HCC)     Sickle cell disease (HCC)     Stroke (HCC)      Past Surgical History:   Procedure Laterality Date    CESAREAN SECTION      CHOLECYSTECTOMY      LAPAROTOMY      TUBAL LIGATION      VASCULAR SURGERY       Family History   Problem Relation Age of Onset    Diabetes Mother     Hypertension Mother     Diabetes Father     Hypertension Father      Social History     Socioeconomic History    Marital status: Single     Spouse name: Not on file    Number of children: Not on file    Years of education: Not on file    Highest education level: Not on file   Occupational History    Not on file   Tobacco Use    Smoking status: Never     Passive exposure: Never    Smokeless tobacco: Never    Substance and Sexual Activity    Alcohol use: Not Currently    Drug use: Never    Sexual activity: Not Currently   Other Topics Concern    Not on file   Social History Narrative    Not on file     Social Determinants of Health     Financial Resource Strain: Not on file   Food Insecurity: No Food Insecurity (06/13/2023)    Hunger Vital Sign     Worried About Running Out of Food in the Last Year: Never true     Ran Out of Food in the Last Year: Never true   Transportation Needs: No Transportation Needs (06/13/2023)    PRAPARE - Therapist, art (Medical): No     Lack of Transportation (Non-Medical): No  Physical Activity: Not on file   Stress: Not on file   Social Connections: Not on file   Intimate Partner Violence: Not on file   Housing Stability: Low Risk  (06/13/2023)    Housing Stability Vital Sign     Unable to Pay for Housing in the Last Year: No     Number of Places Lived in the Last Year: 1     Unstable Housing in the Last Year: No     Current Facility-Administered Medications   Medication Dose Route Frequency Provider Last Rate Last Admin    amoxicillin-clavulanate (AUGMENTIN) 875-125 MG per tablet 1 tablet  1 tablet Oral 2 times per day Trisha Mangle, APRN - CNP   1 tablet at 06/19/23 0932    0.9 % sodium chloride infusion   IntraVENous PRN Trisha Mangle, APRN - CNP        diphenhydrAMINE (BENADRYL) capsule 25 mg  25 mg Oral See Admin Instructions Trisha Mangle, APRN - CNP        magnesium oxide (MAG-OX) tablet 400 mg  400 mg Oral BID Trisha Mangle, APRN - CNP   400 mg at 06/19/23 0934    potassium chloride (KLOR-CON M) extended release tablet 20 mEq  20 mEq Oral BID Particia Lather, APRN - CNP   20 mEq at 06/19/23 0933    azithromycin (ZITHROMAX) tablet 250 mg  250 mg Oral Daily Albin Fischer Kornegay, NP-C   250 mg at 06/19/23 5366    HYDROmorphone (DILAUDID) tablet 2 mg  2 mg Oral Q4H PRN Fredderick Phenix, NP-C   2 mg at 06/19/23 4403    acetaminophen (TYLENOL) tablet 650 mg   650 mg Oral See Admin Instructions Trisha Mangle, APRN - CNP   650 mg at 06/15/23 4742    diphenhydrAMINE (BENADRYL) capsule 25 mg  25 mg Oral See Admin Instructions Trisha Mangle, APRN - CNP   25 mg at 06/19/23 1101    cetirizine (ZYRTEC) tablet 10 mg  10 mg Oral Daily Trisha Mangle, APRN - CNP   10 mg at 06/19/23 0934    FLUoxetine (PROZAC) capsule 40 mg  40 mg Oral Daily Trisha Mangle, APRN - CNP   40 mg at 06/19/23 0940    folic acid (FOLVITE) tablet 1 mg  1 mg Oral Daily Trisha Mangle, APRN - CNP   1 mg at 06/19/23 0934    hydroxyurea (HYDREA) chemo capsule 1,000 mg  1,000 mg Oral Daily Trisha Mangle, APRN - CNP   1,000 mg at 06/19/23 0935    levETIRAcetam (KEPPRA) tablet 1,500 mg  1,500 mg Oral BID Trisha Mangle, APRN - CNP   1,500 mg at 06/19/23 0932    rivaroxaban (XARELTO) tablet 20 mg  20 mg Oral Daily with breakfast Trisha Mangle, APRN - CNP   20 mg at 06/18/23 0920    sodium chloride flush 0.9 % injection 5-40 mL  5-40 mL IntraVENous 2 times per day Trisha Mangle, APRN - CNP   10 mL at 06/19/23 0937    sodium chloride flush 0.9 % injection 5-40 mL  5-40 mL IntraVENous PRN Trisha Mangle, APRN - CNP        0.9 % sodium chloride infusion   IntraVENous PRN Trisha Mangle, APRN - CNP        polyethylene glycol (GLYCOLAX) packet 17 g  17 g Oral Daily PRN Trisha Mangle, APRN - CNP        dextrose 5 % and 0.45 % sodium chloride infusion  IntraVENous Continuous Fredderick Phenix, NP-C 75 mL/hr at 06/19/23 0119 New Bag at 06/19/23 0119    acetaminophen (TYLENOL) tablet 650 mg  650 mg Oral Q6H PRN Trisha Mangle, APRN - CNP   650 mg at 06/14/23 0835    ondansetron (ZOFRAN-ODT) disintegrating tablet 4 mg  4 mg Oral Q6H PRN Trisha Mangle, APRN - CNP   4 mg at 06/13/23 1727    Or    ondansetron (ZOFRAN) injection 4 mg  4 mg IntraVENous Q6H PRN Trisha Mangle, APRN - CNP        prochlorperazine (COMPAZINE) tablet 10 mg  10 mg Oral Q6H PRN Trisha Mangle, APRN - CNP        Or    prochlorperazine (COMPAZINE) injection 10 mg  10 mg IntraVENous Q6H  PRN Trisha Mangle, APRN - CNP        [Held by provider] HYDROmorphone HCl PF (DILAUDID) injection 1 mg  1 mg IntraVENous Q3H PRN Trisha Mangle, APRN - CNP   1 mg at 06/16/23 0731    naloxone (NARCAN) injection 0.4 mg  0.4 mg IntraVENous PRN Trisha Mangle, APRN - CNP           OBJECTIVE:  Patient Vitals for the past 8 hrs:   BP Temp Temp src Pulse Resp SpO2   06/19/23 1136 129/78 98.2 F (36.8 C) Oral 63 18 97 %   06/19/23 1100 139/77 98.3 F (36.8 C) Oral 60 19 100 %   06/19/23 0915 139/78 98.3 F (36.8 C) Oral 59 19 99 %     Temp (24hrs), Avg:98.1 F (36.7 C), Min:97.5 F (36.4 C), Max:98.3 F (36.8 C)    No intake/output data recorded.    Physical Exam:  Constitutional: Acutely ill-appearing female in no acute distress, sitting comfortably in the hospital bed.    HEENT: Normocephalic and atraumatic. Oropharynx is clear, mucous membranes are moist.  Extraocular muscles are intact.  Sclerae anicteric. Neck supple.    Skin Warm and dry.  No bruising and no rash noted.  No erythema.  No pallor.    Neuro Grossly nonfocal with no obvious sensory or motor deficits.   MSK C/o back pain and L side pain.    Psych Appropriate mood and affect.    Full exam per attending MD    Labs:    Recent Results (from the past 24 hour(s))   Comprehensive Metabolic Panel    Collection Time: 06/19/23  6:49 AM   Result Value Ref Range    Sodium 141 136 - 145 mmol/L    Potassium 3.7 3.5 - 5.1 mmol/L    Chloride 109 (H) 98 - 107 mmol/L    CO2 23 20 - 28 mmol/L    Anion Gap 9 9 - 18 mmol/L    Glucose 101 (H) 70 - 99 mg/dL    BUN 8 6 - 23 MG/DL    Creatinine 2.95 6.21 - 1.10 MG/DL    Est, Glom Filt Rate 76 >60 ml/min/1.73m2    Calcium 9.0 8.8 - 10.2 MG/DL    Total Bilirubin 0.9 0.0 - 1.2 MG/DL    ALT 7 (L) 12 - 65 U/L    AST 22 15 - 37 U/L    Alk Phosphatase 93 35 - 104 U/L    Total Protein 6.8 6.3 - 8.2 g/dL    Albumin 3.0 (L) 3.5 - 5.0 g/dL    Globulin 3.8 (H) 2.3 - 3.5 g/dL    Albumin/Globulin Ratio 0.8 (L) 1.0 -  1.9     Magnesium     Collection Time: 06/19/23  6:49 AM   Result Value Ref Range    Magnesium 1.7 (L) 1.8 - 2.4 mg/dL   CBC with Auto Differential    Collection Time: 06/19/23  6:49 AM   Result Value Ref Range    WBC 4.9 4.3 - 11.1 K/uL    RBC 1.49 (L) 4.05 - 5.2 M/uL    Hemoglobin 6.1 (LL) 11.7 - 15.4 g/dL    Hematocrit 16.1 (L) 35.8 - 46.3 %    MCV 115.4 (H) 82 - 102 FL    MCH 40.9 (H) 26.1 - 32.9 PG    MCHC 35.5 (H) 31.4 - 35.0 g/dL    RDW Cannot be calculated %    Platelets 305 150 - 450 K/uL    MPV 9.7 9.4 - 12.3 FL    nRBC 1.28 (H) 0.0 - 0.2 K/uL    Neutrophils % 56 43 - 78 %    Lymphocytes % 32 13 - 44 %    Monocytes % 9 4.0 - 12.0 %    Eosinophils % 3 0.5 - 7.8 %    Basophils % 0 0.0 - 2.0 %    Immature Granulocytes % 0 0.0 - 5.0 %    Neutrophils Absolute 2.8 1.7 - 8.2 K/UL    Lymphocytes Absolute 1.6 0.5 - 4.6 K/UL    Monocytes Absolute 0.4 0.1 - 1.3 K/UL    Eosinophils Absolute 0.1 0.0 - 0.8 K/UL    Basophils Absolute 0.0 0.0 - 0.2 K/UL    Immature Granulocytes Absolute 0.0 0.0 - 0.5 K/UL    RBC Comment MODERATE  ANISOCYTOSIS + POIKILOCYTOSIS        RBC Comment SLIGHT  POLYCHROMASIA        RBC Comment SLIGHT  HYPOCHROMIA        RBC Comment SLIGHT  MACROCYTOSIS        RBC Comment SLIGHT  MICROCYTOSIS        RBC Comment OCCASIONAL  SCHISTOCYTES        WBC Comment Result Confirmed By Smear      Platelet Comment ADEQUATE      Differential Type AUTOMATED     Reticulocytes    Collection Time: 06/19/23  6:49 AM   Result Value Ref Range    Reticulocyte Count,Automated 5.9 (H) 0.3 - 2.0 %    Absolute Retic # 0.0885 0.026 - 0.095 M/ul    Immature Retic Fraction 15.8 3.0 - 15.9 %    Retic Hemoglobin conc. 37 (H) 29 - 35 pg   PREPARE RBC (CROSSMATCH), 1 Units    Collection Time: 06/19/23  7:30 AM   Result Value Ref Range    History Check Historical check performed    PREPARE RBC (CROSSMATCH), 1 Units    Collection Time: 06/19/23  8:45 AM   Result Value Ref Range    History Check Historical check performed    TYPE AND SCREEN    Collection  Time: 06/19/23  9:31 AM   Result Value Ref Range    Crossmatch expiration date 06/22/2023,2359     ABO/Rh O POSITIVE     Antibody Screen NEG     Unit Number [id number]     Product Code Blood Bank RC LRIR     Unit Divison 00     Dispense Status Blood Bank ALLOCATED     Antigen/Antibody       C NEGATIVE,  E NEGATIVE,  K NEGATIVE,  Jkb NEGATIVE,  Fya NEGATIVE,  Fyb NEGATIVE,  [facility] NEGATIVE      Crossmatch Result Compatible     Unit Number [id number]     Product Code Blood Bank [facility] LRIR     Unit Divison 00     Dispense Status Blood Bank ISSUED     Unit Issue Date/Time 0987654321     Product Code Blood Bank E0332V00     Blood Bank Unit Type and Rh O POS     Blood Bank ISBT Product Blood Type 5100     Blood Bank Blood Product Expiration Date 0987654321     Antigen/Antibody       C NEGATIVE,  E NEGATIVE,  K NEGATIVE,  Jkb NEGATIVE,  Fya NEGATIVE,  Fyb NEGATIVE,  SICKLEDEX NEGATIVE      Crossmatch Result Compatible        Imaging:  Xray Result (most recent):  XR CHEST PORTABLE 06/17/2023    Narrative  Chest X-ray    INDICATION: Cough    COMPARISON: 15 June 2023  TECHNIQUE: AP/PA view of the chest was obtained.    FINDINGS: Peripherally located opacities noted at the left lung base. There are  no infiltrates or effusions.  The heart size is normal.  The bony thorax is  intact.    Impression  Very subtle peripherally located opacities noted at the left lung  base which could represent early infectious process. Recommend clinical  correlation, treatment and follow-up radiograph in 4 to 6 weeks to document  resolution      Electronically signed by Barnett Abu    CT Result (most recent):  CT CHEST PULMONARY EMBOLISM W CONTRAST 06/13/2023    Narrative  CT CHEST PULMONARY EMBOLISM W CONTRAST    HISTORY:hypoxia, hx of PE, sickle cell crisis.    COMPARISON: 05/27/2022    TECHNIQUE: Following the noncontrasted scout, axial images of the thorax were  obtained following infusion of 100 cc of Isovue 370.   Post-processing on an  independent workstation was performed to reconstruct MIP images for evaluation  of the thoracic vasculature.    FINDINGS: There is no pulmonary embolism, aortic dissection, thoracic aneurysm  or pericardial fluid. 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    Discoid atelectasis in the lingula segment, right middle lobe and both lower  lobes is stable. The lung parenchyma is otherwise clear. No pleural effusion or  pneumothorax is noted. There is no axillary, mediastinal or hilar adenopathy.    Limited evaluation of the upper abdomen demonstrates no gross abnormalities.    Review of bone windows demonstrates no osteoblastic or lytic lesions.    Impression  1.  There is no pulmonary embolism, aortic dissection, pericardial fluid or  thoracic aneurysm.  2.  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  3.  No acute lung findings. Discoid atelectasis in the lingula segment, right  middle lobe and both lower lobes is stable.        All CT scans at this facility are performed using low  dose modulation  techniques as appropriate to perform exam including the following: automated  exposure control; use of iterative reconstruction technique; adjustment of the  mA and/or kV according to patient size (this includes techniques or standardized  protocols for targeted exams where dose is matched to indication/reason for  exam).              Electronically signed by Ardeth Perfect  D NGUYEN          ASSESSMENT:  Principal Problem:    Sickle cell crisis (HCC)  Active Problems:    Diffuse pain in left upper extremity    Drowsiness    Hypoxia    Neck swelling    Other chest pain  Resolved Problems:    * No resolved hospital problems. *    Ms. Joos is a 47 y.o. female admitted on 06/12/2023. The primary encounter diagnosis was Sickle cell crisis (HCC). 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  She is a patient of Dr. Welton Flakes followed for sickle cell disease on folic acid 1mg  daily and Hydrea 1000mg  daily.  She presented to ED with c/o "pain all over" for the past four days.  She has been helping family move for the past 5 days.  Out of dilaudid at home.  She reports some chest discomfort and shortness of breath.  Denies cough or fever.  CXR neg.  UA pending.  RVP pending.  Hgb 7.3 (b/l ~7-8).  She is being admitted for further management of sickle cell crisis.      PLAN:  Sickle cell crisis  - con't folic acid, hydrea  - RVP pending.  CXR neg.  CTA chest ordered.  - UA pending.  - D5 1/2 NS @ / hr  - No IV benadryl or IV phenergan  - Pain mgmt  7/11 Pain ongoing.  Toradol ordered.  Hgb down to 6.6.  Hold off on transfusion at this time.  UA not c/w UTI.    7/12 Pain ongoing, but controlled on currently regimen.  On toradol.  Requiring IV dilaudid.  Hgb down to 6.0.  Transfuse 1 unit PRBCs.  7/13 Hgb 7.4 after transfusion. On toradol every 6 hours. Hold IV Dilaudid and change to PO in preparation for discharge.   7/14 Hgb 7.4. Doing okay on PO Dilaudid.  Complains of chest pain - obtain EKG.   7/15 Pain controlled on PO Dilaudid and IV toradol. Pain today is mostly in her back and L side. Order to place on 2L NC per Dr. Threasa Beards.   7/16 c/o ongoing pain.  Hgb down to 6.1.  Transfuse 1 unit PRBCs.    Hypoxia/Infiltrate / ?Pneumonia  7/10 On Airvo @ 60L.  ABG results reviewed.  CTA chest pending.  RVP pending.  Consult pulm.  7/11 Airvo down to 45L, sat 100%.  RVP neg.  CTA chest neg for acute findings; shows atelectasis.  Pulm consult pending.  IS ordered.  7/12 On O2 @ 1L.  O2 qualifier ordered.  Pulm following.  Repeat CXR.  7/13 Off O2 and saturating well.   7/14 Repeat CXR this morning with possible opacity left lung base -  start azithromycin 500 mg now and then 250 mg x 4 days.   7/15 Continue Azithromycin. On room air.   7/16  Con't Azith, add augmentin.    LUE pain/Bilateral Neck Edema  - LUE dopp ordered  7/11 LUE dopp neg  7/13 right and left upper extremity doppler negative for DVT in upper extremities and bilateral IJ.      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 Consult placed.   7/16 Consult performed this AM.  Pt declines procedures including angioplasty or stent.  Wants 2nd opinion as OP.    Continue home meds  Supportive care  On Xarelto    Dispo:  Anticipate discharge home once pain adequately controlled, goal for discharge is 24-48 hours.  PT recommends possible HH at discharge.              Trisha Mangle, APRN - CNP   [facility]  [address]  [place] [address]  Office : [phone]  Fax : [phone]   I personally saw, exammed and counselled the patient, and discussed with NP, agree with above history/assessment/plan. Exam: No acute distress, normal breathing effort and breath sounds, regular heart rate and heart sound, benign abd, normal ROM of limbs. 47 y.o.female admitted for sickle cell pain crisis, pain persists and still trying to convert to p.o. regimen with IV Dilaudid as needed, continue oxygen, IV fluid, antibiotics, supportive SOP.          Omer Jack, M.D.  [address] [facility]  [address]  [place], [place] [address]  Office : [phone]  Fax : [phone]

## 2023-06-19 NOTE — Progress Notes (Signed)
Patient states that she would like second opinion from outpatient.  Called and let IR know

## 2023-06-19 NOTE — Consults (Signed)
Patient refused intervention. Wants to get a second opinion when she is discharged.

## 2023-06-19 NOTE — Plan of Care (Signed)
Problem: Pain  Goal: Verbalizes/displays adequate comfort level or baseline comfort level  06/19/2023 1150 by Everlene Balls, RN  Outcome: Progressing  06/19/2023 0036 by Valaria Good, RN  Outcome: Progressing     Problem: Safety - Adult  Goal: Free from fall injury  06/19/2023 1150 by Everlene Balls, RN  Outcome: Progressing  06/19/2023 0036 by Valaria Good, RN  Outcome: Progressing     Problem: ABCDS Injury Assessment  Goal: Absence of physical injury  06/19/2023 0036 by Valaria Good, RN  Outcome: Progressing     Problem: Neurosensory - Adult  Goal: Achieves stable or improved neurological status  Recent Flowsheet Documentation  Taken 06/19/2023 0915 by Everlene Balls, RN  Achieves stable or improved neurological status: Assess for and report changes in neurological status  06/19/2023 0036 by Valaria Good, RN  Outcome: Progressing  Goal: Achieves maximal functionality and self care  Recent Flowsheet Documentation  Taken 06/19/2023 0915 by Everlene Balls, RN  Achieves maximal functionality and self care: Monitor swallowing and airway patency with patient fatigue and changes in neurological status  06/19/2023 0036 by Valaria Good, RN  Outcome: Progressing     Problem: Respiratory - Adult  Goal: Achieves optimal ventilation and oxygenation  Recent Flowsheet Documentation  Taken 06/19/2023 0915 by Everlene Balls, RN  Achieves optimal ventilation and oxygenation: Assess for changes in respiratory status  06/19/2023 0036 by Valaria Good, RN  Outcome: Progressing     Problem: Cardiovascular - Adult  Goal: Maintains optimal cardiac output and hemodynamic stability  06/19/2023 0036 by Valaria Good, RN  Outcome: Progressing  Goal: Absence of cardiac dysrhythmias or at baseline  06/19/2023 0036 by Valaria Good, RN  Outcome: Progressing     Problem: Skin/Tissue Integrity - Adult  Goal: Skin integrity remains intact  Recent Flowsheet Documentation  Taken 06/19/2023 0915 by Everlene Balls, RN  Skin Integrity Remains Intact: Monitor for areas of redness and/or skin breakdown  06/19/2023 0036 by Valaria Good, RN  Outcome: Progressing  Goal: Incisions, wounds, or drain sites healing without S/S of infection  06/19/2023 0036 by Valaria Good, RN  Outcome: Progressing  Goal: Oral mucous membranes remain intact  06/19/2023 0036 by Valaria Good, RN  Outcome: Progressing     Problem: Musculoskeletal - Adult  Goal: Return mobility to safest level of function  06/19/2023 0036 by Valaria Good, RN  Outcome: Progressing  Goal: Maintain proper alignment of affected body part  06/19/2023 0036 by Valaria Good, RN  Outcome: Progressing  Goal: Return ADL status to a safe level of function  06/19/2023 0036 by Valaria Good, RN  Outcome: Progressing     Problem: Gastrointestinal - Adult  Goal: Minimal or absence of nausea and vomiting  06/19/2023 0036 by Valaria Good, RN  Outcome: Progressing  Goal: Maintains or returns to baseline bowel function  06/19/2023 0036 by Valaria Good, RN  Outcome: Progressing  Goal: Maintains adequate nutritional intake  06/19/2023 0036 by Valaria Good, RN  Outcome: Progressing  Goal: Establish and maintain optimal ostomy function  06/19/2023 0036 by Valaria Good, RN  Outcome: Progressing     Problem: Genitourinary - Adult  Goal: Absence of urinary retention  Recent Flowsheet Documentation  Taken 06/19/2023 0915 by Everlene Balls, RN  Absence of urinary retention: Assess patient's ability to void and empty bladder  06/19/2023 0036 by Valaria Good, RN  Outcome: Progressing  Goal: Urinary catheter remains patent  06/19/2023 0036 by Valaria Good, RN  Outcome: Progressing     Problem: Infection - Adult  Goal: Absence of infection at discharge  06/19/2023 0036 by Valaria Good, RN  Outcome: Progressing  Goal: Absence of infection during hospitalization  06/19/2023 0036 by Valaria Good, RN  Outcome: Progressing  Goal: Absence of  fever/infection during anticipated neutropenic period  06/19/2023 0036 by Valaria Good, RN  Outcome: Progressing     Problem: Metabolic/Fluid and Electrolytes - Adult  Goal: Electrolytes maintained within normal limits  06/19/2023 0036 by Valaria Good, RN  Outcome: Progressing  Goal: Hemodynamic stability and optimal renal function maintained  06/19/2023 0036 by Valaria Good, RN  Outcome: Progressing  Goal: Glucose maintained within prescribed range  06/19/2023 0036 by Valaria Good, RN  Outcome: Progressing     Problem: Hematologic - Adult  Goal: Maintains hematologic stability  06/19/2023 0036 by Valaria Good, RN  Outcome: Progressing     Problem: Chronic Conditions and Co-morbidities  Goal: Patient's chronic conditions and co-morbidity symptoms are monitored and maintained or improved  06/19/2023 0036 by Valaria Good, RN  Outcome: Progressing

## 2023-06-19 NOTE — Care Coordination-Inpatient (Signed)
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     Will continue to monitor and assist with any supportive care needs that arise.

## 2023-06-20 ENCOUNTER — Encounter: Payer: MEDICARE | Attending: Anesthesiology | Primary: Student in an Organized Health Care Education/Training Program

## 2023-06-20 LAB — COMPREHENSIVE METABOLIC PANEL
ALT: 9 U/L — ABNORMAL LOW (ref 12–65)
AST: 21 U/L (ref 15–37)
Albumin/Globulin Ratio: 0.8 — ABNORMAL LOW (ref 1.0–1.9)
Albumin: 3.2 g/dL — ABNORMAL LOW (ref 3.5–5.0)
Alk Phosphatase: 95 U/L (ref 35–104)
Anion Gap: 9 mmol/L (ref 9–18)
BUN: 9 MG/DL (ref 6–23)
CO2: 23 mmol/L (ref 20–28)
Calcium: 9.2 MG/DL (ref 8.8–10.2)
Chloride: 106 mmol/L (ref 98–107)
Creatinine: 0.93 MG/DL (ref 0.60–1.10)
Est, Glom Filt Rate: 76 mL/min/{1.73_m2} (ref >60)
Globulin: 3.8 g/dL — ABNORMAL HIGH (ref 2.3–3.5)
Glucose: 101 mg/dL — ABNORMAL HIGH (ref 70–99)
Potassium: 4.1 mmol/L (ref 3.5–5.1)
Sodium: 138 mmol/L (ref 136–145)
Total Bilirubin: 1 MG/DL (ref 0.0–1.2)
Total Protein: 7 g/dL (ref 6.3–8.2)

## 2023-06-20 LAB — CBC WITH AUTO DIFFERENTIAL
Basophils %: 0 % (ref 0.0–2.0)
Basophils Absolute: 0 10*3/uL (ref 0.0–0.2)
Eosinophils %: 2 % (ref 0.5–7.8)
Eosinophils Absolute: 0.1 10*3/uL (ref 0.0–0.8)
Hematocrit: 23.6 % — ABNORMAL LOW (ref 35.8–46.3)
Hemoglobin: 8.1 g/dL — ABNORMAL LOW (ref 11.7–15.4)
Immature Granulocytes %: 1 % (ref 0.0–5.0)
Immature Granulocytes Absolute: 0 10*3/uL (ref 0.0–0.5)
Lymphocytes %: 23 % (ref 13–44)
Lymphocytes Absolute: 1 10*3/uL (ref 0.5–4.6)
MCH: 36.8 PG — ABNORMAL HIGH (ref 26.1–32.9)
MCHC: 34.3 g/dL (ref 31.4–35.0)
MCV: 107.3 FL — ABNORMAL HIGH (ref 82–102)
MPV: 9.6 FL (ref 9.4–12.3)
Monocytes %: 10 % (ref 4.0–12.0)
Monocytes Absolute: 0.4 10*3/uL (ref 0.1–1.3)
Neutrophils %: 64 % (ref 43–78)
Neutrophils Absolute: 2.8 10*3/uL (ref 1.7–8.2)
Platelet Comment: ADEQUATE
Platelets: 264 10*3/uL (ref 150–450)
RBC: 2.2 M/uL — ABNORMAL LOW (ref 4.05–5.2)
WBC: 4.3 10*3/uL (ref 4.3–11.1)
nRBC: 1.49 10*3/uL — ABNORMAL HIGH (ref 0.0–0.2)

## 2023-06-20 LAB — RETICULOCYTES
Absolute Retic #: 0.0957 M/ul — ABNORMAL HIGH (ref 0.026–0.095)
Immature Retic Fraction: 14.7 % (ref 3.0–15.9)
Retic Hemoglobin conc.: 38 pg — ABNORMAL HIGH (ref 29–35)
Reticulocyte Count,Automated: 4.4 % — ABNORMAL HIGH (ref 0.3–2.0)

## 2023-06-20 LAB — MAGNESIUM: Magnesium: 1.8 mg/dL (ref 1.8–2.4)

## 2023-06-20 MED ORDER — AMOXICILLIN-POT CLAVULANATE 875-125 MG PO TABS
875-125 MG | ORAL_TABLET | Freq: Two times a day (BID) | ORAL | 0 refills | Status: AC
Start: 2023-06-20 — End: 2023-06-24

## 2023-06-20 MED ORDER — HYDROMORPHONE HCL 2 MG PO TABS
2 MG | ORAL_TABLET | ORAL | 0 refills | Status: AC | PRN
Start: 2023-06-20 — End: 2023-06-27

## 2023-06-20 MED ORDER — AZITHROMYCIN 250 MG PO TABS
250 MG | ORAL_TABLET | Freq: Every day | ORAL | 0 refills | Status: AC
Start: 2023-06-20 — End: 2023-06-22

## 2023-06-20 MED FILL — POTASSIUM CHLORIDE CRYS ER 20 MEQ PO TBCR: 20 MEQ | ORAL | Qty: 1

## 2023-06-20 MED FILL — HYDROMORPHONE HCL 2 MG PO TABS: 2 MG | ORAL | Qty: 1

## 2023-06-20 MED FILL — XARELTO 20 MG PO TABS: 20 MG | ORAL | Qty: 1

## 2023-06-20 MED FILL — CETIRIZINE HCL 10 MG PO TABS: 10 MG | ORAL | Qty: 1

## 2023-06-20 MED FILL — AZITHROMYCIN 250 MG PO TABS: 250 MG | ORAL | Qty: 1

## 2023-06-20 MED FILL — MAGNESIUM OXIDE -MG SUPPLEMENT 400 (240 MG) MG PO TABS: 400 (240 Mg) MG | ORAL | Qty: 1

## 2023-06-20 MED FILL — LEVETIRACETAM 500 MG PO TABS: 500 MG | ORAL | Qty: 3

## 2023-06-20 MED FILL — AMOXICILLIN-POT CLAVULANATE 875-125 MG PO TABS: 875-125 MG | ORAL | Qty: 1

## 2023-06-20 MED FILL — FLUOXETINE HCL 20 MG PO CAPS: 20 MG | ORAL | Qty: 2

## 2023-06-20 MED FILL — HYDREA 500 MG PO CAPS: 500 MG | ORAL | Qty: 2

## 2023-06-20 MED FILL — FOLIC ACID 1 MG PO TABS: 1 MG | ORAL | Qty: 1

## 2023-06-20 NOTE — Care Coordination-Inpatient (Signed)
Pt is for discharge home today with family and no needs/supportive care orders recieved for CM at this time. Pt is in agreement with her discharge plan. 2nd IM letter given.   06/20/23 1400   Services At/After Discharge   Transition of Care Consult (CM Consult) Discharge Planning   Services At/After Discharge None   Veteran Resource Information Provided? No   Mode of Transport at Discharge Other (see comment)  (family)   Confirm Follow Up Transport Family   Condition of Participation: Discharge Planning   The Plan for Transition of Care is related to the following treatment goals: Pt to discharge home with her family   The Patient and/or Patient Representative was provided with a Choice of Provider? Patient   The Patient and/Or Patient Representative agree with the Discharge Plan? Yes   Freedom of Choice list was provided with basic dialogue that supports the patient's individualized plan of care/goals, treatment preferences, and shares the quality data associated with the providers?  Yes

## 2023-06-20 NOTE — Discharge Summary (Signed)
[facility] Hematology & Oncology: Inpatient Hematology / Oncology Discharge Summary Note    Patient ID:  [name] Bright  [id number]  47 y.o.  Sep 03, 1976    Admit Date: 06/12/2023    Discharge Date: 06/20/2023    Admission Diagnoses: Sickle cell crisis (HCC) [D57.00]  Drowsiness [R40.0]  Hypoxia [R09.02]  Diffuse pain in left upper extremity [M79.602]    Discharge Diagnoses:  Principal Diagnosis: Sickle cell crisis (HCC)  Principal Problem:    Sickle cell crisis (HCC)  Active Problems:    Diffuse pain in left upper extremity    Drowsiness    Hypoxia    Neck swelling    Other chest pain  Resolved Problems:    * No resolved hospital problems. [facility] Course:  [name] Bright is a 47 y.o. female admitted on 06/12/2023. The primary encounter diagnosis was Sickle cell crisis (HCC). 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  She is a patient of Dr. Welton Flakes followed for sickle cell disease on folic acid 1mg  daily and Hydrea 1000mg  daily.  She presented to ED with c/o "pain all over" for the past four days.  She has been helping family move for the past 5 days.  Out of dilaudid at home.  She reports some chest discomfort and shortness of breath.  Denies cough or fever.  CXR neg.  UA pending.  RVP pending.  Hgb 7.3 (b/l ~7-8).  She is being admitted for further management of sickle cell crisis.    See course of hospitalization below.  Pain controlled on po dilaudid.  She has not required IV dilaudid in >4 days.  Hgb 8.1 s/p tx (b/l ~7-8).  She was treated for possible pneumonia with Azith/Aug and will continue both upon discharge to complete antibx treatment.  She initially required Airvo and is no longer requiring O2.  After receiving IVF, she developed worsening upper neck edema.  [place] neg for DVT.  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  Pt declines any procedures, wants second opinion as OP.  She is feeling better and is ready for discharge home.  She is scheduled to f/u with Dr. Welton Flakes on 7/29.  Advised to call with uncontrollable symptoms or with any other concerns.    Sickle cell crisis  - con't folic acid, hydrea  - RVP pending.  CXR neg.  CTA chest ordered.  - UA pending.  - D5 1/2 NS @ / hr  - No IV benadryl or IV phenergan  - Pain mgmt  7/11 Pain ongoing.  Toradol ordered.  Hgb down to 6.6.  Hold off on transfusion at this time.  UA not c/w UTI.    7/12 Pain ongoing, but controlled on currently regimen.  On toradol.  Requiring IV dilaudid.  Hgb down to 6.0.  Transfuse 1 unit PRBCs.  7/13 Hgb 7.4 after transfusion. On toradol every 6 hours. Hold IV Dilaudid and change to PO in preparation for discharge.   7/14 Hgb 7.4. Doing okay on PO Dilaudid.  Complains of chest pain - obtain EKG.   7/15 Pain controlled on PO Dilaudid and IV toradol. Pain today is mostly in her back and L side. Order to place on 2L NC per Dr. Threasa Beards.  7/16 c/o ongoing pain.  Hgb down to 6.1.  Transfuse 1 unit PRBCs.     Hypoxia/Infiltrate / ?Pneumonia  7/10 On Airvo @ 60L.  ABG results reviewed.  CTA chest pending.  RVP pending.  Consult pulm.  7/11 Airvo down to 45L, sat 100%.  RVP neg.  CTA chest neg for acute findings; shows atelectasis.  Pulm consult pending.  IS ordered.  7/12 On O2 @ 1L.  O2 qualifier ordered.  Pulm following.  Repeat CXR.  7/13 Off O2 and saturating well.   7/14 Repeat CXR this morning with possible opacity left lung base - start azithromycin 500 mg now and then 250 mg x 4 days.   7/15 Continue Azithromycin. On room air.   7/16  Con't Azith, add augmentin.     LUE pain/Bilateral Neck Edema  - LUE dopp ordered  7/11 LUE dopp neg  7/13 right and left upper extremity doppler negative for DVT in upper extremities and bilateral IJ.       Hx SVC thrombosis / RUE DVT / PE  - con't  Xarelto  7/15 IR messaged saying there is a chronic short segment upper SVC occlusion and this would be amendable for angioplasty and/or stent. Consult placed.   7/16 Consult performed this AM.  Pt declines procedures including angioplasty or stent.  Wants 2nd opinion as OP.    Consults:  IP CONSULT TO PULMONOLOGY  IP CONSULT TO INTERVENTIONAL RADIOLOGY    Pertinent Diagnostic Studies:   Labs:    Recent Labs     06/18/23  0454 06/19/23  0649 06/20/23  0611   WBC 5.6 4.9 4.3   HGB 7.1* 6.1* 8.1*   PLT 308 305 264      Recent Labs     06/18/23  0454 06/19/23  0649 06/20/23  0611   NA 138 141 138   K 3.3* 3.7 4.1   CL 106 109* 106   CO2 22 23 23    BUN 7 8 9    MG 1.7* 1.7* 1.8       Imaging:  Xray Result (most recent):  XR CHEST PORTABLE 06/17/2023    Narrative  Chest X-ray    INDICATION: Cough    COMPARISON: 15 June 2023  TECHNIQUE: AP/PA view of the chest was obtained.    FINDINGS: Peripherally located opacities noted at the left lung base. There are  no infiltrates or effusions.  The heart size is normal.  The bony thorax is  intact.    Impression  Very subtle peripherally located opacities noted at the left lung  base which could represent early infectious process. Recommend clinical  correlation, treatment and follow-up radiograph in 4 to 6 weeks to document  resolution      Electronically signed by Daneil Dan Dingwall    Right upper extremity venous ultrasound     INDICATION: Right upper extremity swelling     COMPARISON: Ultrasound 06/13/2023.     FINDINGS: Doppler ultrasound of the right upper extremity shows normal flow in  the subclavian, axillary, and basilic veins. The cephalic vein, brachial vein,  and forearm veins are also patent. There is normal flow in the right internal  jugular vein. Decreased phasicity of the venous waveforms which likely relates  to the patient's known upper SVC occlusion.     IMPRESSION:  No evidence of venous thrombus in the right upper extremity    XR CHEST PORTABLE     INDICATION:  Hypoxia.     COMPARISON: CT  06/13/2023.     FINDINGS:  A single AP view of the chest is provided for interpretation.  The  heart size is normal. There is left basilar atelectasis. The left hemidiaphragm  is eventrated. There is no evidence of pneumothorax or pleural effusion.  Degenerative change is present within the right shoulder.     IMPRESSION:  Eventration of the left hemidiaphragm with left basilar atelectasis.     No acute cardiopulmonary findings.        CT Result (most recent):  CT CHEST PULMONARY EMBOLISM W CONTRAST 06/13/2023    Narrative  CT CHEST PULMONARY EMBOLISM W CONTRAST    HISTORY:hypoxia, hx of PE, sickle cell crisis.    COMPARISON: 05/27/2022    TECHNIQUE: Following the noncontrasted scout, axial images of the thorax were  obtained following infusion of 100 cc of Isovue 370.  Post-processing on an  independent workstation was performed to reconstruct MIP images for evaluation  of the thoracic vasculature.    FINDINGS: There is no pulmonary embolism, aortic dissection, thoracic aneurysm  or pericardial fluid. 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    Discoid atelectasis in the lingula segment, right middle lobe and both lower  lobes is stable. The lung parenchyma is otherwise clear. No pleural effusion or  pneumothorax is noted. There is no axillary, mediastinal or hilar adenopathy.    Limited evaluation of the upper abdomen demonstrates no gross abnormalities.    Review of bone windows demonstrates no osteoblastic or lytic lesions.    Impression  1.  There is no pulmonary embolism, aortic dissection, pericardial fluid or  thoracic aneurysm.  2.  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  3.  No acute lung findings. Discoid atelectasis in the lingula segment, right  middle lobe and both lower lobes is stable.        All CT scans at this facility are performed using low  dose  modulation  techniques as appropriate to perform exam including the following: automated  exposure control; use of iterative reconstruction technique; adjustment of the  mA and/or kV according to patient size (this includes techniques or standardized  protocols for targeted exams where dose is matched to indication/reason for  exam).              Electronically signed by Georgiann Mccoy    Left upper extremity venous ultrasound     INDICATION:     COMPARISON:  None     FINDINGS: Doppler ultrasound of the Left upper extremity shows normal flow in  the subclavian, axillary, and basilic veins. The cephalic vein, brachial vein,  and forearm veins are also patent. There is normal flow in the left internal  jugular vein.     IMPRESSION:  No evidence of venous thrombus in the left upper extremity     Chest X-ray     INDICATION: Chest pain     COMPARISON: May 27, 2022 TECHNIQUE: PA and lateral views of the chest were  obtained.     FINDINGS: The lungs are clear. There are no infiltrates or effusions.  The heart  size is normal.  The bony thorax is intact.       IMPRESSION:  No acute findings in the chest       Medication List        START taking these medications      amoxicillin-clavulanate 875-125 MG per tablet  Commonly known as:  AUGMENTIN  Take 1 tablet by mouth every 12 hours for 7 doses     azithromycin 250 MG tablet  Commonly known as: ZITHROMAX  Take 1 tablet by mouth daily for 1 dose  Start taking on: June 21, 2023     HYDROmorphone 2 MG tablet  Commonly known as: DILAUDID  Take 1 tablet by mouth every 4 hours as needed for Pain for up to 7 days. Max Daily Amount: 12 mg            CHANGE how you take these medications      hydroxyurea 500 MG chemo capsule  Commonly known as: Hydrea  Take 2 capsules by mouth daily  What changed: Another medication with the same name was removed. Continue taking this medication, and follow the directions you see here.            CONTINUE taking these medications      fexofenadine 180  MG tablet  Commonly known as: ALLEGRA     FLUoxetine 20 MG capsule  Commonly known as: PROZAC     fluticasone 50 MCG/ACT nasal spray  Commonly known as: FLONASE  2 sprays by Each Nostril route in the morning.     folic acid 1 MG tablet  Commonly known as: FOLVITE     levETIRAcetam 750 MG tablet  Commonly known as: KEPPRA  Take 2 tablets by mouth in the morning and 2 tablets before bedtime.     naloxone 4 MG/0.1ML Liqd nasal spray  1 spray by Nasal route as needed for Opioid Reversal     ondansetron 8 MG Tbdp disintegrating tablet  Commonly known as: Zofran ODT  Place 0.5-1 tablets under the tongue every 6-8 hours as needed for Nausea or Vomiting     rivaroxaban 20 MG Tabs tablet  Commonly known as: XARELTO            STOP taking these medications      diphenhydrAMINE 25 MG tablet  Commonly known as: SOMINEX     diphenhydrAMINE-APAP (sleep) 25-500 MG tablet  Commonly known as: TYLENOL PM EXTRA STRENGTH     HYDROcodone-acetaminophen 10-325 MG per tablet  Commonly known as: NORCO     naproxen 500 MG EC tablet  Commonly known as: EC NAPROSYN     pantoprazole 40 MG tablet  Commonly known as: PROTONIX     vitamin D 1.25 MG (50000 UT) Caps capsule  Commonly known as: ERGOCALCIFEROL               Where to Get Your Medications        These medications were sent to [facility] [address] [place], [place] - [address] - P [phone] Carmon Ginsberg [phone]  [address], [place] [address]      Phone: [phone]   amoxicillin-clavulanate 875-125 MG per tablet  azithromycin 250 MG tablet  HYDROmorphone 2 MG tablet       I have reviewed the patient's controlled substance prescription history, as maintained in the [place] prescription monitoring program, so that the prescriptions(s) for a controlled substance can be given.        OBJECTIVE:  Patient Vitals for the past 8 hrs:   BP Temp Temp src Pulse Resp SpO2   06/20/23 0822 125/75 98.6 F (37 C) Oral 73 18 97 %     Temp (24hrs), Avg:98.2 F (36.8  C), Min:97.7 F (36.5 C), Max:98.6 F (37 C)    07/17 0701 -  07/17 1900  In: -   Out: 1350 [Urine:1350]    Physical Exam:  Constitutional: Well developed, well nourished female in no acute distress, sitting comfortably on the hospital bed.   HEENT: Normocephalic and atraumatic. Oropharynx is clear, mucous membranes are moist.  Extraocular muscles are intact.  Sclerae anicteric. Neck supple without JVD. No thyromegaly present.    Skin Warm and dry.  No bruising and no rash noted.  No erythema.  No pallor.    Neuro Grossly nonfocal with no obvious sensory or motor deficits.   MSK Normal range of motion in general.     Psych Appropriate mood and affect.    Full exam per attending MD    ASSESSMENT:    Principal Problem:    Sickle cell crisis (HCC)  Active Problems:    Diffuse pain in left upper extremity    Drowsiness    Hypoxia    Neck swelling    Other chest pain  Resolved Problems:    * No resolved hospital problems. *      DISPOSITION:  Discharging home.  Follow up with Dr. Welton Flakes on 7/29.              Trisha Mangle, APRN - CNP  [facility] Hematology & Oncology  [address]  [place] [address]  Office : [phone]  Fax : [phone]   I personally saw, exammed and counselled the patient, and discussed with NP, agree with above history/assessment/plan. Exam: No acute distress, normal breathing effort and breath sounds, regular heart rate and heart sound, benign abd, normal ROM of limbs. 47 y.o.female admitted for sickle cell pain crisis, responded to systemic management and converted to p.o. regimen, discharge as arranged above and set up following office on 7/29, call as needed.  Total time of discharge 33 minutes.          Omer Jack, M.D.  [address] [facility]  [address]  [place], [place] [address]  Office : [phone]  Fax : [phone]

## 2023-06-23 LAB — TYPE AND SCREEN
ABO/Rh: O POS
Antibody Screen: NEGATIVE
Antigen/Antibody: NEGATIVE
Antigen/Antibody: NEGATIVE
Blood Bank Blood Product Expiration Date: 202408042359
Blood Bank ISBT Product Blood Type: 5100
Blood Bank Unit Type and Rh: O POS
Dispense Status Blood Bank: TRANSFUSED
Unit Divison: 0
Unit Divison: 0
Unit Issue Date/Time: 71620241119

## 2023-06-28 ENCOUNTER — Encounter

## 2023-07-02 ENCOUNTER — Encounter: Payer: MEDICARE | Attending: Hematology | Primary: Student in an Organized Health Care Education/Training Program

## 2023-07-02 ENCOUNTER — Encounter: Payer: MEDICARE | Primary: Student in an Organized Health Care Education/Training Program

## 2023-07-11 ENCOUNTER — Encounter

## 2023-07-12 ENCOUNTER — Inpatient Hospital Stay: Admit: 2023-07-12 | Payer: MEDICARE | Primary: Student in an Organized Health Care Education/Training Program

## 2023-07-12 ENCOUNTER — Ambulatory Visit
Admit: 2023-07-12 | Discharge: 2023-07-12 | Payer: MEDICARE | Attending: Hematology | Primary: Student in an Organized Health Care Education/Training Program

## 2023-07-12 DIAGNOSIS — D5700 Hb-SS disease with crisis, unspecified: Secondary | ICD-10-CM

## 2023-07-12 LAB — CBC WITH AUTO DIFFERENTIAL
Basophils %: 0 % (ref 0.0–2.0)
Basophils Absolute: 0 10*3/uL (ref 0.0–0.2)
Eosinophils %: 0 % — ABNORMAL LOW (ref 0.5–7.8)
Eosinophils Absolute: 0 10*3/uL (ref 0.0–0.8)
Hematocrit: 26.5 % — ABNORMAL LOW (ref 35.8–46.3)
Hemoglobin: 9 g/dL — ABNORMAL LOW (ref 11.7–15.4)
Immature Granulocytes %: 0 % (ref 0.0–5.0)
Immature Granulocytes Absolute: 0 10*3/uL (ref 0.0–0.5)
Lymphocytes %: 53 % — ABNORMAL HIGH (ref 13–44)
Lymphocytes Absolute: 2.6 10*3/uL (ref 0.5–4.6)
MCH: 37.5 PG — ABNORMAL HIGH (ref 26.1–32.9)
MCHC: 34 g/dL (ref 31.4–35.0)
MCV: 110.4 FL — ABNORMAL HIGH (ref 82.0–102.0)
MPV: 9.2 FL — ABNORMAL LOW (ref 9.4–12.3)
Monocytes %: 5 % (ref 4.0–12.0)
Monocytes Absolute: 0.2 10*3/uL (ref 0.1–1.3)
Neutrophils %: 42 % — ABNORMAL LOW (ref 43–78)
Neutrophils Absolute: 2 10*3/uL (ref 1.7–8.2)
Platelet Comment: ADEQUATE
Platelets: 308 10*3/uL (ref 150–450)
RBC: 2.4 M/uL — ABNORMAL LOW (ref 4.05–5.2)
WBC: 4.8 10*3/uL (ref 4.3–11.1)
nRBC: 0.36 10*3/uL — ABNORMAL HIGH (ref 0.0–0.2)

## 2023-07-12 LAB — COMPREHENSIVE METABOLIC PANEL
ALT: 16 U/L (ref 12–65)
AST: 27 U/L (ref 15–37)
Albumin/Globulin Ratio: 0.9 — ABNORMAL LOW (ref 1.0–1.9)
Albumin: 4 g/dL (ref 3.5–5.0)
Alk Phosphatase: 116 U/L — ABNORMAL HIGH (ref 35–104)
Anion Gap: 11 mmol/L (ref 9–18)
BUN: 11 MG/DL (ref 6–23)
CO2: 24 mmol/L (ref 20–28)
Calcium: 9.2 MG/DL (ref 8.8–10.2)
Chloride: 104 mmol/L (ref 98–107)
Creatinine: 1.14 MG/DL — ABNORMAL HIGH (ref 0.60–1.10)
Est, Glom Filt Rate: 59 mL/min/{1.73_m2} — ABNORMAL LOW (ref >60)
Globulin: 4.4 g/dL — ABNORMAL HIGH (ref 2.3–3.5)
Glucose: 77 mg/dL (ref 70–99)
Potassium: 4 mmol/L (ref 3.5–5.1)
Sodium: 139 mmol/L (ref 136–145)
Total Bilirubin: 0.6 MG/DL (ref 0.0–1.2)
Total Protein: 8.4 g/dL — ABNORMAL HIGH (ref 6.3–8.2)

## 2023-07-12 LAB — VITAMIN D 25 HYDROXY: Vit D, 25-Hydroxy: 58.7 ng/mL (ref 30.0–100.0)

## 2023-07-12 LAB — D-DIMER, QUANTITATIVE: D-Dimer, Quant: 0.37 ug/ml(FEU) (ref <0.56)

## 2023-07-12 MED ORDER — HYDROMORPHONE HCL 2 MG PO TABS
2 | ORAL_TABLET | Freq: Four times a day (QID) | ORAL | 0 refills | Status: DC | PRN
Start: 2023-07-12 — End: 2024-07-28
  Filled 2023-07-12: qty 100, 25d supply, fill #0

## 2023-07-12 MED ORDER — VITAMIN D (ERGOCALCIFEROL) 1.25 MG (50000 UT) PO CAPS
1.25 | ORAL_CAPSULE | Freq: Every day | ORAL | 0 refills | Status: AC
Start: 2023-07-12 — End: 2024-07-28
  Filled 2023-07-12: qty 14, 14d supply, fill #0

## 2023-07-12 NOTE — Progress Notes (Signed)
[facility]  [address]  [place], [place] [address]  Phone: [phone]           07/12/2023  [name] Bright  08-29-1976  [id number]         [name] Bright is a 47 year old African-American female who has returned to my clinic for a follow-up visit; she was initially referred to me by Dr. Delfino Lovett for management of Sickle Cell Disease; she is on Hydrea 1000 mg/day.        ALLERGIES:    Latex.        FAMILY HISTORY:    Her brother died of Sickle Cell Crisis.        SOCIAL HISTORY:    She is single and lives alone. She is on SSI. She denies ever using any tobacco products.        PAST MEDICAL HISTORY:     Migraines, Seizures, Vitamin D deficiency, right arm DVT, Pulmonary Embolism, Depression, splenic infarction, GERD, renal insufficiency, Osteoarthritis, SVC thrombosis and Sickle Cell Disease.        ROS:  The patient complained of fatigue and exertional dyspnea; all other systems negative.        PHYSICAL EXAM:   The patient was alert, awake and oriented, no acute distress was noted. Oral examination did not reveal any mucosal lesions. Lymph node examination did not reveal any adenopathy. Neck examination revealed a supple neck, no thyromegaly or masses were noted. Chest examination revealed normal vesicular breath sounds. Heart examination revealed S-1 and S-2 without any murmurs. Abdominal examination revealed a non-tender abdomen, bowel sounds were positive, no organomegaly could be appreciated. Examination of the extremities did not reveal any tenderness or erythema. Examination of the skin did not reveal any lesions.  Medical problems and test results were reviewed with the patient today.        KPS:    80.        LABORATORY INVESTIGATIONS:  CBC showed a WBC count of 4.8, ANC was 2.1, Hemoglobin was 9.1, MCV was 110 and Platelets were 308.        ASSESSMENT:    Sickle Cell Disease with pain; Vitamin D deficiency; Pulmonary Embolism. I spent a total of 42 minutes on the day of the visit,  managing the care of this patient.        PLAN:    She should continue taking Xarelto 20 mg/day, Folic acid 1 mg/day, Hydrea 1000 mg/day and supplemental Vitamin D. She may require Endari, Voxelotor or Crizanlizumab in the future; at her next clinic visit I will re-check her CBC, CMP, D-dimers and Vitamin D level.        [name] Grave MD  Hematology/BMT

## 2023-07-12 NOTE — Patient Instructions (Addendum)
Patient Information from Today's Visit    The members of your Oncology Medical Home are listed below:    Physician Provider: Sharin Grave, Medical Oncologist  Advanced Practice Clinician: Albin Fischer, NP  Registered Nurse: N/A  Navigator: N/A  Medical Assistant: Dalbert Batman., MA and Kerin Ransom., MA  Scheduler: Iran Planas   Supportive Care Services: Harvie Bridge., LMSW    Diagnosis:   PE/Sickle Cell      Follow Up Instructions:   Lab work looks stable today  Continue taking Hydrea and Xarelto as directed  We will send in a prescription for Dilaudid to help with pain  We will bring you back in January 2025 for a follow up with Dr.Khan and lab work prior.      Treatment Summary has been discussed and given to patient:N/A      Current Labs:   Hospital Outpatient Visit on 07/12/2023   Component Date Value Ref Range Status    D-Dimer, Quant 07/12/2023 0.37  <0.56 ug/ml(FEU) Final    Comment: (NOTE)  A D-Dimer result less than 0.5 ug/mL FEU combined with a low clinical   pretest probability of DVT and/or PE has a negative predictive value   of 90-100%.  The positive predictive value is 50% or less.      WBC 07/12/2023 4.8  4.3 - 11.1 K/uL Final    Comment: RESULTS CHECKED X 2  PERIPHERAL REVIEW TO FOLLOW      RBC 07/12/2023 2.40 (L)  4.05 - 5.2 M/uL Final    Hemoglobin 07/12/2023 9.0 (L)  11.7 - 15.4 g/dL Final    Hematocrit 16/09/9603 26.5 (L)  35.8 - 46.3 % Final    MCV 07/12/2023 110.4 (H)  82.0 - 102.0 FL Final    MCH 07/12/2023 37.5 (H)  26.1 - 32.9 PG Final    MCHC 07/12/2023 34.0  31.4 - 35.0 g/dL Final    Platelets 54/08/8118 308  150 - 450 K/uL Final    MPV 07/12/2023 9.2 (L)  9.4 - 12.3 FL Final    nRBC 07/12/2023 0.36 (H)  0.0 - 0.2 K/uL Final    **Note: Absolute NRBC parameter is now reported with Hemogram**    Differential Type 07/12/2023 PENDING   Incomplete                 Please refer to After Visit Summary or MyChart for upcoming appointment information. Please call our office for rescheduling needs at least 24  hours before your scheduled appointment time.If you have any questions regarding your upcoming schedule please reach out to your care team through MyChart or call [phone].     Please notify your assigned Nurse Navigator of any unplanned hospital admissions or Emergency Room visits within 24 hours of discharge.    -------------------------------------------------------------------------------------------------------------------  Please call our office at [phone] if you have any  of the following symptoms:   Fever of 100.5 or greater  Chills  Shortness of breath  Swelling or pain in one leg    After office hours an answering service is available and will contact a provider for emergencies or if you are experiencing any of the above symptoms.        Caesar Chestnut, MA

## 2023-12-11 ENCOUNTER — Encounter: Admit: 2023-12-11 | Admitting: Hematology

## 2023-12-11 DIAGNOSIS — D5700 Hb-SS disease with crisis, unspecified: Secondary | ICD-10-CM

## 2023-12-13 ENCOUNTER — Other Ambulatory Visit: Payer: MEDICARE | Primary: Student in an Organized Health Care Education/Training Program

## 2023-12-13 ENCOUNTER — Encounter: Payer: MEDICARE | Attending: Hematology | Primary: Student in an Organized Health Care Education/Training Program

## 2024-01-22 ENCOUNTER — Encounter

## 2024-01-23 MED ORDER — HYDROXYUREA 500 MG PO CAPS
500 | ORAL_CAPSULE | Freq: Every day | ORAL | 5 refills | 60.00000 days | Status: DC
Start: 2024-01-23 — End: 2024-09-01

## 2024-01-24 ENCOUNTER — Encounter

## 2024-06-28 ENCOUNTER — Encounter

## 2024-07-22 ENCOUNTER — Encounter

## 2024-07-28 ENCOUNTER — Inpatient Hospital Stay
Admit: 2024-07-28 | Payer: Medicare (Managed Care) | Primary: Student in an Organized Health Care Education/Training Program

## 2024-07-28 ENCOUNTER — Ambulatory Visit
Admit: 2024-07-28 | Discharge: 2024-07-28 | Payer: Medicare (Managed Care) | Attending: Hematology | Primary: Student in an Organized Health Care Education/Training Program

## 2024-07-28 VITALS — BP 119/83 | HR 87 | Temp 99.00000°F | Resp 18 | Ht 68.0 in | Wt 173.6 lb

## 2024-07-28 DIAGNOSIS — D5700 Hb-SS disease with crisis, unspecified: Principal | ICD-10-CM

## 2024-07-28 LAB — CBC WITH AUTO DIFFERENTIAL
Basophils %: 0.2 % (ref 0.0–2.0)
Basophils Absolute: 0.01 K/UL (ref 0.00–0.20)
Eosinophils %: 0.9 % (ref 0.5–7.8)
Eosinophils Absolute: 0.04 K/UL (ref 0.00–0.80)
Hematocrit: 23 % — ABNORMAL LOW (ref 35.8–46.3)
Hemoglobin: 8.1 g/dL — ABNORMAL LOW (ref 11.7–15.4)
Immature Granulocytes %: 0.2 % (ref 0.0–5.0)
Immature Granulocytes Absolute: 0.01 K/UL (ref 0.00–0.50)
Lymphocytes %: 42.1 % (ref 13.0–44.0)
Lymphocytes Absolute: 1.84 K/UL (ref 0.50–4.60)
MCH: 40.3 pg — ABNORMAL HIGH (ref 26.1–32.9)
MCHC: 35.2 g/dL — ABNORMAL HIGH (ref 31.4–35.0)
MCV: 114.4 FL — ABNORMAL HIGH (ref 82.0–102.0)
MPV: 9.2 FL — ABNORMAL LOW (ref 9.4–12.3)
Monocytes %: 14.4 % — ABNORMAL HIGH (ref 4.0–12.0)
Monocytes Absolute: 0.63 K/UL (ref 0.10–1.30)
Neutrophils %: 42.2 % — ABNORMAL LOW (ref 43.0–78.0)
Neutrophils Absolute: 1.84 K/UL (ref 1.70–8.20)
Platelets: 428 K/uL (ref 150–450)
RBC: 2.01 M/uL — ABNORMAL LOW (ref 4.05–5.2)
RDW: 17.6 % — ABNORMAL HIGH (ref 11.9–14.6)
WBC: 4.4 K/uL (ref 4.3–11.1)
nRBC: 0.35 K/uL — ABNORMAL HIGH (ref 0.0–0.2)

## 2024-07-28 LAB — COMPREHENSIVE METABOLIC PANEL
ALT: 23 U/L (ref 8–45)
AST: 39 U/L — ABNORMAL HIGH (ref 15–37)
Albumin/Globulin Ratio: 0.9 — ABNORMAL LOW (ref 1.0–1.9)
Albumin: 4.1 g/dL (ref 3.5–5.0)
Alk Phosphatase: 114 U/L — ABNORMAL HIGH (ref 35–104)
Anion Gap: 13 mmol/L (ref 7–16)
BUN: 12 mg/dL (ref 6–23)
CO2: 21 mmol/L (ref 20–29)
Calcium: 10 mg/dL (ref 8.8–10.2)
Chloride: 105 mmol/L (ref 98–107)
Creatinine: 1 mg/dL (ref 0.60–1.10)
Est, Glom Filt Rate: 69 ml/min/1.73m2 (ref >60)
Globulin: 4.5 g/dL — ABNORMAL HIGH (ref 2.3–3.5)
Glucose: 105 mg/dL — ABNORMAL HIGH (ref 70–99)
Potassium: 3.6 mmol/L (ref 3.5–5.1)
Sodium: 139 mmol/L (ref 136–145)
Total Bilirubin: 1.3 mg/dL — ABNORMAL HIGH (ref 0.0–1.2)
Total Protein: 8.6 g/dL — ABNORMAL HIGH (ref 6.3–8.2)

## 2024-07-28 LAB — VITAMIN D 25 HYDROXY: Vit D, 25-Hydroxy: 38.4 ng/mL (ref 30.0–100.0)

## 2024-07-28 LAB — D-DIMER, QUANTITATIVE: D-Dimer, Quant: 0.86 ug{FEU}/mL — ABNORMAL HIGH (ref <0.56)

## 2024-07-28 MED ORDER — HYDROMORPHONE HCL 2 MG PO TABS
2 | ORAL_TABLET | Freq: Four times a day (QID) | ORAL | 0 refills | Status: AC | PRN
Start: 2024-07-28 — End: 2024-08-27
  Filled 2024-07-28: qty 120, 30d supply, fill #0

## 2024-07-28 NOTE — Progress Notes (Signed)
 Oral Chemotherapy Adherence:     Current Regimen:  Drug Name: Hydrea   Dose: 500 mg  Frequency: 2 capsules daily (1000 mg)    Barriers to care identified including (financial, physical, psychosocial) : No    Missed doses reported: No    Patient verbalizes understanding of what to do in the event of a missed dose: Yes    Adverse reactions/toxicities reported:None, See Review of Systems

## 2024-07-28 NOTE — Progress Notes (Signed)
 [facility]  [address]  [place], [place] [address]  Phone: [phone]           07/28/2024  [name] Bright  07-26-1976  [id number]         [name] Bright is a 48 year old African-American female who has returned to my clinic for a follow-up visit; she was initially referred to me by Dr. Addie Alosa for management of Sickle Cell Disease; she is on Hydrea  1000 mg/day.      REASON FOR CLINIC VISIT:    6 month follow-up, Hydrea  toxicity management and fatigue.        ALLERGIES:    Latex.        FAMILY HISTORY:    Her brother died of Sickle Cell Crisis.        SOCIAL HISTORY:    She is single and lives alone. She is on SSI. She denies ever using any tobacco products.        PAST MEDICAL HISTORY:     Migraines, Seizures, Vitamin D  deficiency, right arm DVT, Pulmonary Embolism, Depression, splenic infarction, GERD, renal insufficiency, Osteoarthritis, SVC thrombosis and Sickle Cell Disease.        ROS:  The patient complained of fatigue and exertional dyspnea; all other systems negative.        PHYSICAL EXAM:   The patient was alert, awake and oriented, no acute distress was noted. Oral examination did not reveal any mucosal lesions. Lymph node examination did not reveal any adenopathy. Neck examination revealed a supple neck, no thyromegaly or masses were noted. Chest examination revealed normal vesicular breath sounds. Heart examination revealed S-1 and S-2 without any murmurs. Abdominal examination revealed a non-tender abdomen, bowel sounds were positive, no organomegaly could be appreciated. Examination of the extremities did not reveal any tenderness or erythema. Examination of the skin did not reveal any lesions.  Medical problems and test results were reviewed with the patient today.        KPS:    80.        LABORATORY INVESTIGATIONS:  CBC showed a WBC count of 4.4, ANC was 1.8, Hemoglobin was 8.1, MCV was 114 and Platelets were 428.        ASSESSMENT:    Sickle Cell Disease with pain (  stable ); Vitamin D  deficiency ( stable ); Pulmonary Embolism ( stable ).         PLAN:    She should continue taking Xarelto  20 mg/day, Folic acid  1 mg/day, Hydrea  1000 mg/day and Vitamin D . She may require Endari, Voxelotor or Crizanlizumab in the future; at her next clinic visit I will re-check her CBC, CMP, D-dimers and Vitamin D  level.        Kennie Bathe MD  Hematology/BMT

## 2024-07-28 NOTE — Progress Notes (Signed)
 I have reviewed the patient's controlled substance prescription history, as maintained in the [place] prescription monitoring program, so that the prescription(s) for a  controlled substance can be given

## 2024-07-28 NOTE — Progress Notes (Addendum)
 Prior authorization request for Hydromorphone  HCl 2 mg tablets received through CoverMyMeds under Key Code [id number].  Patient demographics and clinical information submitted through the portal and sent to [facility] for medical review.  The most recent hematology office visit note dated 07/28/24 was uploaded to the portal for medical review.  Pending PA Case ID [id number] for Rx #[id number].    Approval letter received and indexed under the media tab in Epic.    Name of medication: Hydromorphone  HCl 2 mg tablets  Quantity Approved: 120 tablets / 30 day supply  Validity Dates: 07/28/24 - 07/28/25  Authorization #[id number]

## 2024-07-28 NOTE — Patient Instructions (Addendum)
 Patient Information from Today's Visit    The members of your [facility] are listed below:    Physician Provider: Dr. Kennie Bathe   Advanced Practice Clinician: Alan Camel   Registered Nurse: Vernell HERO.  Nurse Navigator: N/A  Medical Assistant: Marien HUNT  Scheduler: Alan Lamp I.   Supportive Care Services: Kallison G., LSMW    Diagnosis (Information Sheet Provided on Day of Diagnosis): Sickle Cell    Follow Up Instructions:     Reviewed labs.   Continue taking your medications and supplements as directed.    We will refer you to pain management to help with your pain medication.   Follow up as scheduled.     Has Treatment Plan Been Finalized? N/A     Current Labs:   Hospital Outpatient Visit on 07/28/2024   Component Date Value Ref Range Status    D-Dimer, Quant 07/28/2024 0.86 (H)  <0.56 ug/ml(FEU) Final    Comment: (NOTE)  A D-Dimer result less than 0.5 ug/mL FEU combined with a low clinical   pretest probability of DVT and/or PE has a negative predictive value   of 90-100%.  The positive predictive value is 50% or less.      Sodium 07/28/2024 139  136 - 145 mmol/L Final    Potassium 07/28/2024 3.6  3.5 - 5.1 mmol/L Final    Chloride 07/28/2024 105  98 - 107 mmol/L Final    CO2 07/28/2024 21  20 - 29 mmol/L Final    Anion Gap 07/28/2024 13  7 - 16 mmol/L Final    Glucose 07/28/2024 105 (H)  70 - 99 mg/dL Final    Comment: <29 mg/dL Consistent with, but not fully diagnostic of hypoglycemia.  100 - 125 mg/dL Impaired fasting glucose/consistent with pre-diabetes mellitus.  > 126 mg/dl Fasting glucose consistent with overt diabetes mellitus      BUN 07/28/2024 12  6 - 23 MG/DL Final    Creatinine 91/74/7974 1.00  0.60 - 1.10 MG/DL Final    Est, Glom Filt Rate 07/28/2024 69  >60 ml/min/1.81m2 Final    Comment:    Pediatric calculator link: https://www.kidney.org/professionals/kdoqi/gfr_calculatorped     These results are not intended for use in patients <34 years of age.     eGFR results are  calculated without a race factor using  the 2021 CKD-EPI equation. Careful clinical correlation is recommended, particularly when comparing to results calculated using previous equations.  The CKD-EPI equation is less accurate in patients with extremes of muscle mass, extra-renal metabolism of creatinine, excessive creatine ingestion, or following therapy that affects renal tubular secretion.      Calcium 07/28/2024 10.0  8.8 - 10.2 MG/DL Final    Total Bilirubin 07/28/2024 1.3 (H)  0.0 - 1.2 MG/DL Final    ALT 91/74/7974 23  8 - 45 U/L Final    AST 07/28/2024 39 (H)  15 - 37 U/L Final    Alk Phosphatase 07/28/2024 114 (H)  35 - 104 U/L Final    Total Protein 07/28/2024 8.6 (H)  6.3 - 8.2 g/dL Final    Albumin 91/74/7974 4.1  3.5 - 5.0 g/dL Final    Globulin 91/74/7974 4.5 (H)  2.3 - 3.5 g/dL Final    Albumin/Globulin Ratio 07/28/2024 0.9 (L)  1.0 - 1.9   Final    WBC 07/28/2024 4.4  4.3 - 11.1 K/uL Final    RBC 07/28/2024 2.01 (L)  4.05 - 5.2 M/uL Final    Hemoglobin 07/28/2024 8.1 (L)  11.7 - 15.4 g/dL Final    Hematocrit 91/74/7974 23.0 (L)  35.8 - 46.3 % Final    MCV 07/28/2024 114.4 (H)  82.0 - 102.0 FL Final    MCH 07/28/2024 40.3 (H)  26.1 - 32.9 PG Final    MCHC 07/28/2024 35.2 (H)  31.4 - 35.0 g/dL Final    RDW 91/74/7974 17.6 (H)  11.9 - 14.6 % Final    Platelets 07/28/2024 428  150 - 450 K/uL Final    MPV 07/28/2024 9.2 (L)  9.4 - 12.3 FL Final    nRBC 07/28/2024 0.35 (H)  0.0 - 0.2 K/uL Final    **Note: Absolute NRBC parameter is now reported with Hemogram**    Neutrophils % 07/28/2024 42.2 (L)  43.0 - 78.0 % Final    Lymphocytes % 07/28/2024 42.1  13.0 - 44.0 % Final    Monocytes % 07/28/2024 14.4 (H)  4.0 - 12.0 % Final    Eosinophils % 07/28/2024 0.9  0.5 - 7.8 % Final    Basophils % 07/28/2024 0.2  0.0 - 2.0 % Final    Immature Granulocytes % 07/28/2024 0.2  0.0 - 5.0 % Final    Neutrophils Absolute 07/28/2024 1.84  1.70 - 8.20 K/UL Final    Lymphocytes Absolute 07/28/2024 1.84  0.50 - 4.60 K/UL  Final    Monocytes Absolute 07/28/2024 0.63  0.10 - 1.30 K/UL Final    Eosinophils Absolute 07/28/2024 0.04  0.00 - 0.80 K/UL Final    Basophils Absolute 07/28/2024 0.01  0.00 - 0.20 K/UL Final    Immature Granulocytes Absolute 07/28/2024 0.01  0.00 - 0.50 K/UL Final    Differential Type 07/28/2024 AUTOMATED    Final           Please refer to After Visit Summary or MyChart for upcoming appointment information. Please call our office for rescheduling needs at least 24 hours before your scheduled appointment time.If you have any questions regarding your upcoming schedule please reach out to your care team through MyChart or call [phone].     Please notify your assigned Nurse Navigator of any unplanned hospital admissions or Emergency Room visits within 24 hours of discharge.    -------------------------------------------------------------------------------------------------------------------  Please call our office at [phone] if you have any  of the following symptoms:   Fever of 100.5 or greater  Chills  Shortness of breath  Swelling or pain in one leg    After office hours an answering service is available and will contact a provider for emergencies or if you are experiencing any of the above symptoms.      Clella Mckeel JINNY RICHARDS, RN

## 2024-08-12 ENCOUNTER — Encounter

## 2024-09-01 ENCOUNTER — Encounter

## 2024-09-01 NOTE — Telephone Encounter (Signed)
 Medication Requested: Hydrea     Date last prescribed: 01/23/24    Requested Pharmacy: [facility] [address] [place] - [address] - P [phone] [place] [phone]     Action Taken: Chart reviewed. Prescription pended to provider for signature.

## 2024-09-03 MED ORDER — HYDROXYUREA 500 MG PO CAPS
500 | ORAL_CAPSULE | Freq: Every day | ORAL | 0 refills | 60.00000 days | Status: AC
Start: 2024-09-03 — End: ?

## 2024-10-20 ENCOUNTER — Encounter

## 2024-10-23 NOTE — Telephone Encounter (Signed)
"  Hung up before I could get any more info. Says [name] Bright called for referral. [name] Bright. Says it wasn't her.   "

## 2024-11-03 NOTE — Telephone Encounter (Signed)
"  Physician provider: Dr. Fernand  Reason for today's call (Please detail here patients chief complaint): pt is calling to follow up about getting a referral to Dr. Mckinley     Last office visit:na  Patient Callback Number: [phone]  Was callback number verified?: Yes  Preferred pharmacy (If refill request): na  Veriified that patient confirmed no refills left at pharmacy? :No  Has the patient called the office for this concern within the last 48 hours?:No    Red Word Mentioned  Warm Transfer Phone Call to (Name): na    Patient notified that their information will be routed to the appropriate team for review. Patient is advised that they will receive a phone call from the appropriate department. If awaiting a call from the triage department and symptoms worsen before receiving a call back, the patient has been advised to proceed to the nearest ED.        "

## 2024-11-06 ENCOUNTER — Encounter: Payer: Medicare (Managed Care) | Primary: Student in an Organized Health Care Education/Training Program

## 2024-11-06 ENCOUNTER — Encounter
Payer: Medicare (Managed Care) | Attending: Hematology | Primary: Student in an Organized Health Care Education/Training Program
